# Patient Record
Sex: Female | Born: 1935 | Race: White | Hispanic: No | Smoking: Never smoker
Health system: Southern US, Community
[De-identification: ages and names within clinical notes are randomized; demographics above are authoritative.]

## PROBLEM LIST (undated history)

## (undated) DIAGNOSIS — E785 Hyperlipidemia, unspecified: Secondary | ICD-10-CM

## (undated) DIAGNOSIS — S62109A Fracture of unspecified carpal bone, unspecified wrist, initial encounter for closed fracture: Secondary | ICD-10-CM

## (undated) DIAGNOSIS — I639 Cerebral infarction, unspecified: Secondary | ICD-10-CM

## (undated) DIAGNOSIS — F329 Major depressive disorder, single episode, unspecified: Secondary | ICD-10-CM

## (undated) DIAGNOSIS — F32A Depression, unspecified: Secondary | ICD-10-CM

## (undated) DIAGNOSIS — I739 Peripheral vascular disease, unspecified: Secondary | ICD-10-CM

## (undated) DIAGNOSIS — U071 COVID-19: Secondary | ICD-10-CM

## (undated) DIAGNOSIS — M1990 Unspecified osteoarthritis, unspecified site: Secondary | ICD-10-CM

## (undated) DIAGNOSIS — D696 Thrombocytopenia, unspecified: Secondary | ICD-10-CM

## (undated) DIAGNOSIS — M8580 Other specified disorders of bone density and structure, unspecified site: Secondary | ICD-10-CM

## (undated) HISTORY — DX: Cerebral infarction, unspecified: I63.9

## (undated) HISTORY — DX: Unspecified osteoarthritis, unspecified site: M19.90

## (undated) HISTORY — DX: Fracture of unspecified carpal bone, unspecified wrist, initial encounter for closed fracture: S62.109A

## (undated) HISTORY — DX: Major depressive disorder, single episode, unspecified: F32.9

## (undated) HISTORY — DX: Other specified disorders of bone density and structure, unspecified site: M85.80

## (undated) HISTORY — DX: Depression, unspecified: F32.A

## (undated) HISTORY — DX: COVID-19: U07.1

## (undated) HISTORY — DX: Thrombocytopenia, unspecified: D69.6

## (undated) HISTORY — DX: Hyperlipidemia, unspecified: E78.5

## (undated) HISTORY — DX: Peripheral vascular disease, unspecified: I73.9

---

## 1965-09-10 HISTORY — PX: APPENDECTOMY: SHX54

## 2000-09-10 DIAGNOSIS — S62109A Fracture of unspecified carpal bone, unspecified wrist, initial encounter for closed fracture: Secondary | ICD-10-CM

## 2000-09-10 HISTORY — DX: Fracture of unspecified carpal bone, unspecified wrist, initial encounter for closed fracture: S62.109A

## 2000-09-10 HISTORY — PX: JOINT REPLACEMENT: SHX530

## 2002-07-21 ENCOUNTER — Ambulatory Visit (HOSPITAL_COMMUNITY): Admission: RE | Admit: 2002-07-21 | Discharge: 2002-07-21 | Payer: Self-pay

## 2003-03-29 ENCOUNTER — Other Ambulatory Visit: Admission: RE | Admit: 2003-03-29 | Discharge: 2003-03-29 | Payer: Self-pay | Admitting: *Deleted

## 2003-05-14 ENCOUNTER — Encounter: Payer: Self-pay | Admitting: *Deleted

## 2003-05-14 ENCOUNTER — Encounter: Admission: RE | Admit: 2003-05-14 | Discharge: 2003-05-14 | Payer: Self-pay | Admitting: *Deleted

## 2003-07-23 ENCOUNTER — Ambulatory Visit (HOSPITAL_COMMUNITY): Admission: RE | Admit: 2003-07-23 | Discharge: 2003-07-23 | Payer: Self-pay | Admitting: *Deleted

## 2004-07-24 ENCOUNTER — Ambulatory Visit (HOSPITAL_COMMUNITY): Admission: RE | Admit: 2004-07-24 | Discharge: 2004-07-24 | Payer: Self-pay | Admitting: *Deleted

## 2005-01-30 ENCOUNTER — Ambulatory Visit (HOSPITAL_COMMUNITY): Admission: RE | Admit: 2005-01-30 | Discharge: 2005-01-30 | Payer: Self-pay | Admitting: Ophthalmology

## 2005-04-11 ENCOUNTER — Other Ambulatory Visit: Admission: RE | Admit: 2005-04-11 | Discharge: 2005-04-11 | Payer: Self-pay | Admitting: *Deleted

## 2005-07-25 ENCOUNTER — Ambulatory Visit (HOSPITAL_COMMUNITY): Admission: RE | Admit: 2005-07-25 | Discharge: 2005-07-25 | Payer: Self-pay | Admitting: Family Medicine

## 2006-04-10 ENCOUNTER — Encounter: Admission: RE | Admit: 2006-04-10 | Discharge: 2006-04-10 | Payer: Self-pay | Admitting: Specialist

## 2006-06-03 ENCOUNTER — Ambulatory Visit: Payer: Self-pay | Admitting: Internal Medicine

## 2006-06-07 ENCOUNTER — Encounter: Admission: RE | Admit: 2006-06-07 | Discharge: 2006-06-07 | Payer: Self-pay | Admitting: Family Medicine

## 2006-06-13 ENCOUNTER — Ambulatory Visit: Payer: Self-pay | Admitting: Internal Medicine

## 2006-07-31 ENCOUNTER — Ambulatory Visit (HOSPITAL_COMMUNITY): Admission: RE | Admit: 2006-07-31 | Discharge: 2006-07-31 | Payer: Self-pay | Admitting: Family Medicine

## 2006-09-10 HISTORY — PX: CARPAL TUNNEL RELEASE: SHX101

## 2007-03-11 ENCOUNTER — Ambulatory Visit: Payer: Self-pay | Admitting: Cardiology

## 2007-03-11 LAB — CONVERTED CEMR LAB
BUN: 14 mg/dL (ref 6–23)
Basophils Absolute: 0 10*3/uL (ref 0.0–0.1)
Basophils Relative: 0.3 % (ref 0.0–1.0)
CO2: 30 meq/L (ref 19–32)
Calcium: 9.1 mg/dL (ref 8.4–10.5)
Chloride: 112 meq/L (ref 96–112)
Creatinine, Ser: 0.7 mg/dL (ref 0.4–1.2)
Eosinophils Absolute: 0.2 10*3/uL (ref 0.0–0.6)
Eosinophils Relative: 3.3 % (ref 0.0–5.0)
GFR calc Af Amer: 106 mL/min
GFR calc non Af Amer: 87 mL/min
Glucose, Bld: 91 mg/dL (ref 70–99)
HCT: 35.9 % — ABNORMAL LOW (ref 36.0–46.0)
Hemoglobin: 12.3 g/dL (ref 12.0–15.0)
Lymphocytes Relative: 32.2 % (ref 12.0–46.0)
MCHC: 34.2 g/dL (ref 30.0–36.0)
MCV: 88.5 fL (ref 78.0–100.0)
Monocytes Absolute: 0.6 10*3/uL (ref 0.2–0.7)
Monocytes Relative: 8.2 % (ref 3.0–11.0)
Neutro Abs: 3.8 10*3/uL (ref 1.4–7.7)
Neutrophils Relative %: 56 % (ref 43.0–77.0)
Platelets: 141 10*3/uL — ABNORMAL LOW (ref 150–400)
Potassium: 3.6 meq/L (ref 3.5–5.1)
Pro B Natriuretic peptide (BNP): 43 pg/mL (ref 0.0–100.0)
RBC: 4.06 M/uL (ref 3.87–5.11)
RDW: 12.9 % (ref 11.5–14.6)
Sodium: 143 meq/L (ref 135–145)
WBC: 6.8 10*3/uL (ref 4.5–10.5)

## 2007-03-26 ENCOUNTER — Encounter: Payer: Self-pay | Admitting: Cardiology

## 2007-03-26 ENCOUNTER — Ambulatory Visit: Payer: Self-pay

## 2007-07-12 LAB — HM COLONOSCOPY: HM Colonoscopy: NORMAL

## 2007-08-11 ENCOUNTER — Ambulatory Visit (HOSPITAL_COMMUNITY): Admission: RE | Admit: 2007-08-11 | Discharge: 2007-08-11 | Payer: Self-pay | Admitting: Family Medicine

## 2008-08-13 ENCOUNTER — Ambulatory Visit (HOSPITAL_COMMUNITY): Admission: RE | Admit: 2008-08-13 | Discharge: 2008-08-13 | Payer: Self-pay | Admitting: Family Medicine

## 2009-04-19 ENCOUNTER — Encounter: Admission: RE | Admit: 2009-04-19 | Discharge: 2009-04-19 | Payer: Self-pay | Admitting: Family Medicine

## 2009-08-31 ENCOUNTER — Ambulatory Visit (HOSPITAL_COMMUNITY): Admission: RE | Admit: 2009-08-31 | Discharge: 2009-08-31 | Payer: Self-pay | Admitting: Family Medicine

## 2009-12-02 ENCOUNTER — Encounter: Payer: Self-pay | Admitting: Family Medicine

## 2010-06-10 LAB — HM MAMMOGRAPHY: HM Mammogram: NORMAL

## 2010-06-22 ENCOUNTER — Encounter: Payer: Self-pay | Admitting: Family Medicine

## 2010-09-05 ENCOUNTER — Ambulatory Visit (HOSPITAL_COMMUNITY)
Admission: RE | Admit: 2010-09-05 | Discharge: 2010-09-05 | Payer: Self-pay | Source: Home / Self Care | Attending: Family Medicine | Admitting: Family Medicine

## 2010-09-08 ENCOUNTER — Ambulatory Visit: Payer: Self-pay | Admitting: Family Medicine

## 2010-09-08 DIAGNOSIS — F329 Major depressive disorder, single episode, unspecified: Secondary | ICD-10-CM | POA: Insufficient documentation

## 2010-09-08 DIAGNOSIS — E785 Hyperlipidemia, unspecified: Secondary | ICD-10-CM | POA: Insufficient documentation

## 2010-09-08 DIAGNOSIS — F339 Major depressive disorder, recurrent, unspecified: Secondary | ICD-10-CM | POA: Insufficient documentation

## 2010-09-08 LAB — CONVERTED CEMR LAB
Cholesterol, target level: 200 mg/dL
HDL goal, serum: 40 mg/dL
LDL Goal: 160 mg/dL

## 2010-09-19 ENCOUNTER — Encounter
Admission: RE | Admit: 2010-09-19 | Discharge: 2010-09-19 | Payer: Self-pay | Source: Home / Self Care | Attending: Family Medicine | Admitting: Family Medicine

## 2010-10-12 NOTE — Assessment & Plan Note (Signed)
Summary: new to est//ccm   Vital Signs:  Patient profile:   77 year old female Menstrual status:  postmenopausal Height:      61.75 inches Weight:      192 pounds BMI:     35.53 Temp:     98.2 degrees F oral Pulse rate:   80 / minute Pulse rhythm:   regular Resp:     12 per minute BP sitting:   120 / 68  (left arm) Cuff size:   regular  Vitals Entered By: Sid Falcon LPN (September 08, 2010 2:55 PM)  Nutrition Counseling: Patient's BMI is greater than 25 and therefore counseled on weight management options.  History of Present Illness: Here to establish care. Hx hyperlipidemia and depression. Treated with Effexor for years and stable.  On Simvastatin for hyperlipidemia with no side effects.   No hx of CAD or PVD.  Depression History:      The patient denies significant weight loss, insomnia, hypersomnia, psychomotor retardation, feelings of worthlessness (guilt), and recurrent thoughts of death or suicide.         Lipid Management History:      Positive NCEP/ATP III risk factors include female age 23 years old or older.  Negative NCEP/ATP III risk factors include non-diabetic, non-tobacco-user status, non-hypertensive, no ASHD (atherosclerotic heart disease), no prior stroke/TIA, no peripheral vascular disease, and no history of aortic aneurysm.      Preventive Screening-Counseling & Management  Alcohol-Tobacco     Smoking Status: never  Allergies (verified): No Known Drug Allergies  Past History:  Family History: Last updated: 09/08/2010 Mother Breast cancer Sister breast cancer  Social History: Last updated: 09/08/2010 Retired Married Never Smoked Alcohol use-no4 pregnancies 4 live births  Risk Factors: Smoking Status: never (09/08/2010)  Past Medical History: Arthritis Depression Hyperlipidemia  Past Surgical History: Total right knee replacement 2002 CTS right 2008 Fx left wrist 2002 Appendectomy 1967 PMH-FH-SH reviewed for  relevance  Family History: Mother Breast cancer Sister breast cancer  Social History: Retired Married Never Smoked Alcohol use-no4 pregnancies 4 live birthsSmoking Status:  never  Review of Systems  The patient denies anorexia, fever, weight loss, chest pain, syncope, dyspnea on exertion, peripheral edema, prolonged cough, headaches, hemoptysis, abdominal pain, melena, hematochezia, severe indigestion/heartburn, incontinence, and depression.    Physical Exam  General:  Well-developed,well-nourished,in no acute distress; alert,appropriate and cooperative throughout examination Ears:  External ear exam shows no significant lesions or deformities.  Otoscopic examination reveals clear canals, tympanic membranes are intact bilaterally without bulging, retraction, inflammation or discharge. Hearing is grossly normal bilaterally. Mouth:  Oral mucosa and oropharynx without lesions or exudates.  Teeth in good repair. Neck:  No deformities, masses, or tenderness noted. Lungs:  Normal respiratory effort, chest expands symmetrically. Lungs are clear to auscultation, no crackles or wheezes. Heart:  normal rate and regular rhythm.   Neurologic:  alert & oriented X3 and cranial nerves II-XII intact.   Psych:  normally interactive, good eye contact, not anxious appearing, and not depressed appearing.     Impression & Recommendations:  Problem # 1:  HYPERLIPIDEMIA (ICD-272.4) send for old records.  She thinks lipids checked just few months ago. Her updated medication list for this problem includes:    Simvastatin 10 Mg Tabs (Simvastatin) ..... Once daily  Problem # 2:  DEPRESSION, RECURRENT (ICD-311)  Her updated medication list for this problem includes:    Effexor Xr 75 Mg Xr24h-cap (Venlafaxine hcl) ..... Once daily  Complete Medication List: 1)  Effexor Xr  75 Mg Xr24h-cap (Venlafaxine hcl) .... Once daily 2)  Simvastatin 10 Mg Tabs (Simvastatin) .... Once daily  Lipid  Assessment/Plan:      Based on NCEP/ATP III, the patient's risk factor category is "0-1 risk factors".  The patient's lipid goals are as follows: Total cholesterol goal is 200; LDL cholesterol goal is 160; HDL cholesterol goal is 40; Triglyceride goal is 150.    Patient Instructions: 1)  Please schedule a follow-up appointment in 3 months .    Orders Added: 1)  New Patient Level III [16010]    Preventive Care Screening  Last Flu Shot:    Date:  07/11/2010    Results:  given   Mammogram:    Date:  06/10/2010    Results:  normal   Colonoscopy:    Date:  07/12/2007    Results:  normal   Last Pneumovax:    Date:  09/10/2001    Results:  given

## 2010-12-08 ENCOUNTER — Encounter: Payer: Self-pay | Admitting: Family Medicine

## 2010-12-08 ENCOUNTER — Ambulatory Visit (INDEPENDENT_AMBULATORY_CARE_PROVIDER_SITE_OTHER): Payer: Medicare Other | Admitting: Family Medicine

## 2010-12-08 DIAGNOSIS — E785 Hyperlipidemia, unspecified: Secondary | ICD-10-CM

## 2010-12-08 DIAGNOSIS — E559 Vitamin D deficiency, unspecified: Secondary | ICD-10-CM

## 2010-12-08 LAB — LIPID PANEL
Cholesterol: 197 mg/dL (ref 0–200)
HDL: 52.2 mg/dL (ref >39.00)
LDL Cholesterol: 127 mg/dL — ABNORMAL HIGH (ref 0–99)
Total CHOL/HDL Ratio: 4
Triglycerides: 89 mg/dL (ref 0.0–149.0)
VLDL: 17.8 mg/dL (ref 0.0–40.0)

## 2010-12-08 NOTE — Progress Notes (Signed)
  Subjective:    Patient ID: Sara Logan, female    DOB: [birth year], 77 y.o.   MRN: [id number]  HPI Patient seen for followup. She has history of hyperlipidemia. Takes simvastatin 10 mg daily. Ran out 2-3 weeks ago. Request lipids today off medication out of curiosity. No history of peripheral vascular disease or CAD. No myalgias or other side effects from her medication.  Patient has history of depression stable on Effexor XR 75 mg daily. She's had recurrent depression and is reluctant to stop. No side effects from medication. Compliant with therapy.  Also reports prior history of vitamin D deficiency. Takes over-the-counter supplement. Previously on 50,000 international unit supplement once weekly.  No history of recent falls.  No history of osteoporosis.   Review of Systems  Constitutional: Negative for appetite change, fatigue and unexpected weight change.  HENT: Negative for trouble swallowing.   Respiratory: Negative for cough and shortness of breath.   Cardiovascular: Negative for chest pain and leg swelling.  Gastrointestinal: Negative for vomiting, abdominal pain and constipation.  Neurological: Negative for syncope.  Hematological: Negative for adenopathy.  Psychiatric/Behavioral: Negative for confusion and dysphoric mood.       Objective:   Physical Exam  Constitutional: She is oriented to person, place, and time. She appears well-developed and well-nourished.  Neck: Neck supple. No thyromegaly present.  Cardiovascular: Normal rate, regular rhythm and normal heart sounds.   No murmur heard. Pulmonary/Chest: Effort normal and breath sounds normal. No respiratory distress. She has no wheezes. She has no rales.  Musculoskeletal: She exhibits no edema.  Lymphadenopathy:    She has no cervical adenopathy.  Neurological: She is alert and oriented to person, place, and time.          Assessment & Plan:  #1 hyperlipidemia. Recheck lipids today #2 history of depression  stable on Effexor XR. Continue current medication #3 reported history of vitamin D deficiency. Currently on over-the-counter placement. Check levels.

## 2010-12-09 ENCOUNTER — Encounter: Payer: Self-pay | Admitting: Family Medicine

## 2010-12-09 LAB — VITAMIN D 25 HYDROXY (VIT D DEFICIENCY, FRACTURES): Vit D, 25-Hydroxy: 30 ng/mL (ref 30–89)

## 2010-12-12 NOTE — Progress Notes (Signed)
Quick Note:  Pt informed ______ 

## 2011-01-01 ENCOUNTER — Other Ambulatory Visit: Payer: Self-pay | Admitting: *Deleted

## 2011-01-01 MED ORDER — VENLAFAXINE HCL ER 75 MG PO CP24: 75.0000 mg | ORAL_CAPSULE | Freq: Every day | ORAL | Status: DC

## 2011-01-23 NOTE — Assessment & Plan Note (Signed)
[facility]                            CARDIOLOGY OFFICE NOTE   NAME:Sara Logan, Sara Logan                       MRN:          [id number]  DATE:03/11/2007                            DOB:          June 30, 1933    REFERRING PHYSICIAN:  Ursula Beath, MD   REASON FOR REFERRAL:  Evaluation of shortness of breath.   CLINICAL HISTORY:  Ms. Idler is 77 years old, and has no prior history  of known heart disease.  I have known her through her husband, Jamyra Zweig, who is a patient of mine.  She recently saw Dr. Raquel James for an  evaluation of some abdominal discomfort, and at that time gave a history  of shortness of breath with exertion.  She noticed this with such  activities as walking a dog.  She says she might have some mild chest  discomfort at the same time.  She has no associated palpitations.   She also had some symptoms of mild belching and acid taste in the back  of her mouth, and Dr. Raquel James prescribed Protonix, but she has not yet  filled this.  She was also having some lower abdominal pain at that  time, but that is pretty much resolved.   When she saw Dr .Raquel James, her ECG showed right bundle branch block,  which I think was a new finding.   PAST MEDICAL HISTORY:  Significant for prior total knee replacement in  2002.   CURRENT MEDICATIONS:  Include glucosamine, fish oil, Fosamax, Effexor,  calcium, vitamin, and multivitamins.   SOCIAL HISTORY:  She lives with her husband, and they just recently  moved to an apartment with plans to move to a friend's home later.  She  is retired, and her husband is a retired Airline pilot.  She does not smoke.  She and her husband have 1 child who lives in [place], and another child  who lives in [place] where her husband grew up.   FAMILY HISTORY:  Her mother is still alive at age 70, and has had a  previous stroke.  Her father died in his mid 29s of congestive heart  failure.  She has 3 sisters who are  alive without heart disease.   REVIEW OF SYSTEMS:  Positive for arthritic symptoms and symptoms of  fatigue.   EXAMINATION:  The blood pressure is 138/83, and the pulse 74 and  regular.  There was no venous distention.  The carotid pulses were full, and I  could hear no bruits.  CHEST:  Was clear.  I could hear no rales or rhonchi.  CARDIAC:  Rhythm was regular.  The heart sounds were normal, and I could  hear no murmurs or gallops.  ABDOMEN:  Soft with normal bowel sounds.  There was no  hepatosplenomegaly.  Peripheral pulses were full.  There was no peripheral edema.  There were  varicosities in the lower extremities.  MUSCULOSKELETAL:  Showed no deformities.  SKIN:  Warm and dry.  NEUROLOGIC EXAM:  Showed no focal neurological signs.   IMPRESSION:  1. Dyspnea of uncertain etiology.  Rule out  ischemic heart disease.  2. Right bundle branch block.  3. History of elevated cholesterol.  4. Possible gastroesophageal reflux disease.   RECOMMENDATIONS:  Ms. Sydney' symptoms of dyspnea on exertion certainly  could be an ischemic equivalent.  Her risk profile is moderate with her  77 and elevated cholesterol.  She is concerned about her symptoms, and  I think they should be evaluated further, and we will plan to arrange  for her to have an adenosine rest/stress Myoview scan and 2-D  echocardiogram.  We will also get a BNP, a BMP, and CBC today.  I will  be in touch with her by phone after we have the results, and we will  decide if any further evaluation is needed.  If we do not find a clear  etiology, then we may attribute her symptoms to increased weight and  conditioning.     Bruce Elvera Lennox Juanda Chance, MD, [facility]  Electronically Signed    BRB/MedQ  DD: 03/11/2007  DT: 03/11/2007  Job #: [id number]   cc:   Ursula Beath, MD

## 2011-01-26 NOTE — H&P (Signed)
Sara Logan, Sara Logan                ACCOUNT NO.:  [id number]   MEDICAL RECORD NO.:  [id number]          PATIENT TYPE:  OIB   LOCATION:  2899                         FACILITY:  [facility]   PHYSICIAN:  Guadelupe Sabin, M.D.DATE OF BIRTH:  [birth year]   DATE OF ADMISSION:  01/30/2005  DATE OF DISCHARGE:  01/30/2005                                HISTORY & PHYSICAL   OUTPATIENT NOTE:  This was a planned outpatient surgical admission of this  77 year old white female admitted for cataract implant surgery of the right  eye.   PRESENT ILLNESS:  This patient was first seen in my office on September 20, 2003. Examination at that time revealed bilateral nuclear cataract formation  in both eyes. Vision was recorded at 20/40 right eye, 20/20 left eye, -1.  Slit lamp examination confirmed the presence of early cataract formation in  both eyes. Dilated detailed fundus examination was normal. Subsequently, the  patient returned for monitoring and was noted to have some exfoliation of  the lens capsule in the left eye. Vision continued to deteriorate in the  right eye at 20/40.  No definite pseudoexfoliation was seen in the right  eye. Due to the patient's complaints, it was elected to proceed with surgery  of the right eye at this time, left eye later as needed. The patient was  given oral discussion and printed information concerning the procedure and  its possible complications. She signed an informed consent and arrangements  were made for her outpatient admission at this time. The patient's  subjective complaints were recorded with aware of blurred smoky dim vision,  difficulty with driving, seeing road signs, difficulty seeing television,  difficulty with reading, difficulty in bright sunlight and glare, and  difficulty with seeing to do her hobbies.   PAST MEDICAL HISTORY:  The patient is under the care of Dr. Benedetto Goad and  is felt to be in satisfactory condition for the proposed surgery.   CURRENT MEDICATIONS:  Effexor XR 75 mg a day.   ALLERGIES:  No known allergies.   Dr. Andrey Campanile feels the patient should be an excellent candidate for the  proposed surgery.   PHYSICAL EXAMINATION:  VITAL SIGNS:  As recorded on admission, blood  pressure 155/85, pulse 84, respirations 16, temperature 97.8.  GENERAL APPEARANCE:  The patient is a pleasant 77 year old white female in  no acute distress.  HEENT:  Eyes - visual acuity as noted above. Glare testing 20/150.  Applanation tonometry normal 20 mm each eye. Slit lamp examination as noted  above. Dilated detailed fundus examination normal.  CHEST/LUNGS:  Clear to percussion and auscultation.  HEART:  Normal sinus rhythm, no cardiomegaly. No murmurs.  ABDOMEN:  Negative.  EXTREMITIES:  Negative.   ADMISSION DIAGNOSIS:  Senile cataract, both eyes, pseudoexfoliation of lens,  left eye.   SURGICAL PLAN:  Cataract implant surgery right eye now, left eye later.      HNJ/MEDQ  D:  02/05/2005  T:  02/05/2005  Job:  [id number]

## 2011-01-26 NOTE — Letter (Signed)
April 01, 2007    Ursula Beath, MD  [facility]- Resident  [address]  [place], [place] [address]   RE:  Sara Logan, Sara Logan  MRN:  [id number]  /  DOB:  1932/12/15   Dear Victorino Dike:   I spoke with Sara Logan today about the results of her test. As you  recall, she was having symptoms of shortness of breath. We did a stress  Myoview scan and there was no evidence of ischemia. We did an  echocardiogram and she had normal left ventricular function and no major  valvular abnormalities. Her left ventricular wall thickness was  borderline. She has no history of hypertension. This could be  congenital, but it is very mild and borderline. I would not make too  much of it.   I think most probably her symptoms of dyspnea are related to her weight  and deconditioning and I told her with the results of these tests, I  think it would be quite safe and good for her to work on a regular  exercise program and weight reduction. She plans to see you in followup  in the near future. She also has a history of elevated cholesterol and  you can make a decision after you have the results of her lipid panel  there if she needs diet or something more.   Thanks very much for asking me to see her.    Sincerely,      Bruce R. Juanda Chance, MD, [facility]  Electronically Signed   BRB/MedQ  DD: 04/01/2007  DT: 04/01/2007  Job #: [id number]

## 2011-01-26 NOTE — Op Note (Signed)
NAMEBREINDEL, COLLIER                ACCOUNT NO.:  [id number]   MEDICAL RECORD NO.:  [id number]          PATIENT TYPE:  OIB   LOCATION:  2899                         FACILITY:  [facility]   PHYSICIAN:  Guadelupe Sabin, M.D.DATE OF BIRTH:  1933-01-07   DATE OF PROCEDURE:  01/30/2005  DATE OF DISCHARGE:  01/30/2005                                 OPERATIVE REPORT   PREOPERATIVE DIAGNOSIS:  Senile nuclear cataract, right eye.   POSTOPERATIVE DIAGNOSIS:  Senile nuclear cataract, right eye.   DATE OF OPERATION:  Planned extracapsular cataract extraction -  phacoemulsification, primary insertion of posterior chamber intraocular lens  implant.   SURGEON:  Guadelupe Sabin, M.D.   ASSISTANT:  Nurse.   ANESTHESIA:  Local 4% Xylocaine plus 0.75 Marcaine retrobulbar block with  Wydase added, topical tetracaine and intraocular Xylocaine.  Anesthesia  standby required in this elderly patient. Patient given sodium pentothal  intravenously during the period of retrobulbar blocking.   OPERATIVE PROCEDURE:  After the patient was prepped and draped, a lid  speculum was inserted in the right eye. Schiotz tonometry was recorded at 6-  8 scale units with a 5.5 g weight. A peritomy was performed adjacent to the  limbus from the 11 to the 1 o'clock position after a superior rectus  traction suture had been placed. The corneoscleral junction was cleaned and  a corneoscleral groove made with a 45-degree Superblade. The anterior  chamber was then entered with the 2.5 mm diamond keratome at the 12 o'clock  position and the 15-degree blade at the 2:30 position. Using a bent 26 gauge  needle on an Ocucoat syringe, a circular capsulorrhexis was begun and then  completed with the Utrata forceps. Hydrodissection and hydrodelineation were  performed using 1% Xylocaine. The 30-degree phacoemulsification tip was then  inserted with slow controlled emulsification of the lens nucleus. Back  fragmentation was achieved  with the Bechert pick. Total ultrasonic time 38  seconds. Average power level 13%. Total amount of fluid used 40 mL.  Following removal of the nucleus, the residual cortex was aspirated with the  irrigation-aspiration tip. The posterior capsule appeared intact with a  brilliant red fundus reflex. It was therefore elected to insert an [employer] [id number] silicone three-piece posterior chamber intraocular  lens implant, diopter strength +21.50. This was inserted with the McDonald  forceps into the anterior chamber and then centered into the capsular bag  using the [facility] lens rotator. The lens appeared to be well centered. The  Ocucoat and Provisc, which had been used intermittently during the  procedures, were aspirated and replaced with balanced salt solution and  Miochol ophthalmic solution. The operative incisions appeared to be self-  sealing. It was, however, elected to place a single 10-0 interrupted nylon  suture across a 12 o'clock incision to ensure closure and to prevent  endophthalmitis. Maxitrol ointment was instilled in the conjunctival cul-de-  sac and a light patch and protective shield applied. Duration of procedure  and anesthesia administration was 45 minutes. The patient tolerated the  procedure well in general and left the operating room  for the recovery room  in good condition.      HNJ/MEDQ  D:  02/05/2005  T:  02/05/2005  Job:  [id number]   cc:   Gloriajean Dell. Andrey Campanile, M.D.  [id number]  [place]  [place] [address]  Fax: [phone]

## 2011-02-20 ENCOUNTER — Other Ambulatory Visit: Payer: Self-pay | Admitting: Family Medicine

## 2011-02-20 DIAGNOSIS — N6009 Solitary cyst of unspecified breast: Secondary | ICD-10-CM

## 2011-02-26 ENCOUNTER — Encounter: Payer: Self-pay | Admitting: Family Medicine

## 2011-02-26 ENCOUNTER — Ambulatory Visit (INDEPENDENT_AMBULATORY_CARE_PROVIDER_SITE_OTHER): Payer: Medicare Other | Admitting: Family Medicine

## 2011-02-26 VITALS — BP 140/70 | Temp 98.8°F | Wt 202.0 lb

## 2011-02-26 DIAGNOSIS — L0201 Cutaneous abscess of face: Secondary | ICD-10-CM

## 2011-02-26 DIAGNOSIS — L03211 Cellulitis of face: Secondary | ICD-10-CM

## 2011-02-26 MED ORDER — CEPHALEXIN 500 MG PO CAPS: 500.0000 mg | ORAL_CAPSULE | Freq: Three times a day (TID) | ORAL | Status: AC

## 2011-02-26 NOTE — Patient Instructions (Signed)
Follow up promptly for any fever or worsening redness/rash.

## 2011-02-26 NOTE — Progress Notes (Signed)
  Subjective:    Patient ID: Sara Logan, female    DOB: [birth year], 77 y.o.   MRN: [id number]  HPI Patient seen with rash 2 week duration right chin and neck region. Denies any fever or chills. Associated edema which is worse when she lays on her right side. Some mild pruritus. Also slightly tender. No vesicles or pustules.  Patient also generally complaining of fatigue. She had CBC back in October with platelet count 115,000 with normal hemoglobin. Denies active depression issues. Appetite and weight stable.   Review of Systems  Constitutional: Positive for fatigue. Negative for fever, chills and appetite change.  HENT: Negative for trouble swallowing.   Respiratory: Negative for cough and shortness of breath.   Cardiovascular: Negative for chest pain and leg swelling.  Skin: Positive for rash.  Hematological: Negative for adenopathy. Does not bruise/bleed easily.  Psychiatric/Behavioral: Negative for dysphoric mood.       Objective:   Physical Exam  Constitutional: She appears well-developed and well-nourished. No distress.  HENT:  Right Ear: External ear normal.  Left Ear: External ear normal.  Mouth/Throat: Oropharynx is clear and moist.       Patient has some erythema involving mostly right neck and submandibular region extending to the chin. No pustules. No vesicles. No submandibular gland swelling. Minimally tender to palpation. Minimal warmth area involved approximately 8 x 8 cm  Neck: Neck supple.  Cardiovascular: Normal rate and regular rhythm.   Pulmonary/Chest: Effort normal and breath sounds normal. No respiratory distress. She has no wheezes. She has no rales.  Lymphadenopathy:    She has no cervical adenopathy.  Skin: Rash noted.          Assessment & Plan:  Cellulitis right neck and chin. Initially thought shingles but no vesicles present.  No right submandibular gland involvement

## 2011-03-07 ENCOUNTER — Ambulatory Visit (INDEPENDENT_AMBULATORY_CARE_PROVIDER_SITE_OTHER): Payer: Medicare Other | Admitting: Family Medicine

## 2011-03-07 ENCOUNTER — Encounter: Payer: Self-pay | Admitting: Family Medicine

## 2011-03-07 VITALS — BP 120/80 | Temp 98.0°F | Wt 200.0 lb

## 2011-03-07 DIAGNOSIS — L0201 Cutaneous abscess of face: Secondary | ICD-10-CM

## 2011-03-07 DIAGNOSIS — L03211 Cellulitis of face: Secondary | ICD-10-CM

## 2011-03-07 NOTE — Patient Instructions (Signed)
Follow up promptly for any redness, fever, or chills after finishing antibiotic.

## 2011-03-07 NOTE — Progress Notes (Signed)
  Subjective:    Patient ID: Sara Logan, female    DOB: 01/30/1933, 77 y.o.   MRN: [id number]  HPI Patient for followup. Recent presumed cellulitis right face. Finishing Keflex. Overall improved. Less redness. Somewhat less swelling though still occasionally swelling. No fever or chills. No sore throat and no swallowing difficulties.   Review of Systems  Constitutional: Negative for fever and chills.  HENT: Negative for sore throat and trouble swallowing.   Respiratory: Negative for cough and shortness of breath.   Skin: Negative for rash.       Objective:   Physical Exam  Constitutional: She appears well-developed and well-nourished.  Cardiovascular: Normal rate and regular rhythm.   Pulmonary/Chest: Breath sounds normal. No respiratory distress. She has no wheezes. She has no rales.  Skin:       Patient has much less erythema compared to last visit. Less warmth to touch. Nontender. No vesicles. No pustules. No parotid swelling. No submandibular or sublingual gland swelling          Assessment & Plan:  Cellulitis of the face improved. Finish out antibiotic. Prompt follow up if recurs

## 2011-03-15 ENCOUNTER — Other Ambulatory Visit: Payer: Self-pay | Admitting: Family Medicine

## 2011-03-15 ENCOUNTER — Ambulatory Visit
Admission: RE | Admit: 2011-03-15 | Discharge: 2011-03-15 | Disposition: A | Discharge status: Home / Self Care | Payer: Medicare Other | Source: Ambulatory Visit | Attending: Family Medicine | Admitting: Family Medicine

## 2011-03-15 DIAGNOSIS — N6009 Solitary cyst of unspecified breast: Secondary | ICD-10-CM

## 2011-03-16 ENCOUNTER — Other Ambulatory Visit: Payer: Self-pay | Admitting: Family Medicine

## 2011-03-16 DIAGNOSIS — N63 Unspecified lump in unspecified breast: Secondary | ICD-10-CM

## 2011-03-28 LAB — HM MAMMOGRAPHY

## 2011-03-30 ENCOUNTER — Ambulatory Visit
Admission: RE | Admit: 2011-03-30 | Discharge: 2011-03-30 | Disposition: A | Discharge status: Home / Self Care | Payer: Medicare Other | Source: Ambulatory Visit | Attending: Family Medicine | Admitting: Family Medicine

## 2011-03-30 ENCOUNTER — Other Ambulatory Visit: Payer: Self-pay | Admitting: Family Medicine

## 2011-03-30 DIAGNOSIS — N63 Unspecified lump in unspecified breast: Secondary | ICD-10-CM

## 2011-04-10 ENCOUNTER — Ambulatory Visit (INDEPENDENT_AMBULATORY_CARE_PROVIDER_SITE_OTHER): Payer: Medicare Other | Admitting: Family Medicine

## 2011-04-10 ENCOUNTER — Encounter: Payer: Self-pay | Admitting: Family Medicine

## 2011-04-10 VITALS — BP 120/80 | Temp 98.6°F | Wt 201.0 lb

## 2011-04-10 DIAGNOSIS — M546 Pain in thoracic spine: Secondary | ICD-10-CM

## 2011-04-10 DIAGNOSIS — M549 Dorsalgia, unspecified: Secondary | ICD-10-CM
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
## 2011-04-10 NOTE — Patient Instructions (Signed)
Try heat to neck and upper back Use Advil or Aleve for pain relief. Consider muscle massage and topical rub such as Biofreeze. Touch base by next week if no better.

## 2011-05-10 ENCOUNTER — Ambulatory Visit (INDEPENDENT_AMBULATORY_CARE_PROVIDER_SITE_OTHER): Payer: Medicare Other | Admitting: Family Medicine

## 2011-05-10 ENCOUNTER — Encounter: Payer: Self-pay | Admitting: Family Medicine

## 2011-05-10 VITALS — BP 130/80 | Temp 98.6°F | Wt 130.0 lb

## 2011-05-10 DIAGNOSIS — M542 Cervicalgia: Secondary | ICD-10-CM

## 2011-05-10 DIAGNOSIS — R21 Rash and other nonspecific skin eruption: Secondary | ICD-10-CM

## 2011-05-10 NOTE — Progress Notes (Signed)
  Subjective:    Patient ID: Sara Logan, female    DOB: August 13, 1933, 77 y.o.   MRN: [id number]  HPI Patient seen for the following.  Persistent redness and swelling right face mostly under her mandible. Denies mass. Duration is over 2 months. Initially consideration for cellulitis and patient treated with cephalexin for 10 days without improvement. Area of redness is nonpainful and nonpruritic. Patient denies fever or chills. No history of any pustules or vesicles. She has not tried anything topically.  Patient complains of some dull to sharp pain right shoulder region radiating toward the hand. Symptoms are somewhat intermittent. Occasional pain in the base cervical neck. No injury. Denies any weakness. No significant numbness. Symptoms are moderate duration. Advil helps. Generally sleeping well.  Past Medical History  Diagnosis Date  . Arthritis   . Depression   . Hyperlipidemia   . Osteopenia   . Thrombocytopenia   . Vitamin D deficiency   . Wrist fracture 2002    left   Past Surgical History  Procedure Date  . Joint replacement 2002    right total   . Carpal tunnel release 2008    right  . Appendectomy 1967    reports that she has never smoked. She does not have any smokeless tobacco history on file. She reports that she does not drink alcohol or use illicit drugs. family history includes Cancer in her mother and sister. No Known Allergies    Review of Systems  Constitutional: Negative for fever, chills, appetite change, fatigue and unexpected weight change.  Respiratory: Negative for cough and shortness of breath.   Cardiovascular: Negative for chest pain.  Gastrointestinal: Negative for abdominal pain.  Neurological: Negative for weakness, numbness and headaches.  Hematological: Negative for adenopathy.       Objective:   Physical Exam  Constitutional: She is oriented to person, place, and time. She appears well-developed and well-nourished.  HENT:  Right Ear:  External ear normal.  Left Ear: External ear normal.  Mouth/Throat: Oropharynx is clear and moist.       No evidence for gum abscess or gum edema  Neck: Neck supple. No thyromegaly present.  Cardiovascular: Normal rate and regular rhythm.   Pulmonary/Chest: Effort normal and breath sounds normal. No respiratory distress. She has no wheezes. She has no rales.  Musculoskeletal: She exhibits no edema.       Minimal tenderness lower cervical region and right trapezius  Lymphadenopathy:    She has no cervical adenopathy.  Neurological: She is alert and oriented to person, place, and time.       Full-strength upper extremities. Symmetric reflexes. No sensory impairment.  Skin:       Patient has area of nontender, non-warmth erythema which is mostly under the right mandible that extends up slightly into the cheek region on the right. Area of redness is approximately 10 X 12 cm.  No pustules or vesicles. No fluctuance. Minimal induration  Psychiatric: She has a normal mood and affect. Her behavior is normal.          Assessment & Plan:  #1 cervical neck pain with probable right upper extremity radiculopathy symptoms.  May have some nerve impingement from cervical spondylosis or possibly discogenic. Nonfocal neuro exam. Pain relatively mild at this point. Recommend observation continue, or Advil for symptom relief. Consider x-rays if no better over the next few weeks or for any progressive symptoms #2 right facial erythema.  Derm referral for second opinion.

## 2011-05-10 NOTE — Patient Instructions (Signed)
Follow up promptly for any R arm weakness, persistent numbness, or any progressive pain Continue Tylenol or Advil as needed for pain. We will call you for appt with Dr Hortense Ramal

## 2011-06-06 ENCOUNTER — Ambulatory Visit (INDEPENDENT_AMBULATORY_CARE_PROVIDER_SITE_OTHER): Payer: Medicare Other | Admitting: Family Medicine

## 2011-06-06 ENCOUNTER — Encounter: Payer: Self-pay | Admitting: Family Medicine

## 2011-06-06 VITALS — BP 130/80 | Temp 98.5°F | Wt 203.0 lb

## 2011-06-06 DIAGNOSIS — H9191 Unspecified hearing loss, right ear: Secondary | ICD-10-CM

## 2011-06-06 DIAGNOSIS — H9190 Unspecified hearing loss, unspecified ear: Secondary | ICD-10-CM

## 2011-06-06 NOTE — Progress Notes (Signed)
  Subjective:    Patient ID: Sara Logan, female    DOB: Nov 06, 1932, 77 y.o.   MRN: [id number]  HPI Patient seen for the following issues  Induration right chin and neck region which had been present for several months. Has appointment with dermatologist next month. No change in symptoms.  Recently went for audiology screening. Bilateral high-frequency hearing loss but she had some asymmetric loss right greater than left at the 1000 dB range. Recommendation to see ENT. She has some bilateral tinnitus and no vertigo. No reported sudden hearing loss.  Questions regarding immunizations. No history of shingles vaccine. No flu vaccine yet.   Review of Systems  Constitutional: Negative for fever and chills.  HENT: Positive for tinnitus. Negative for neck pain and ear discharge.   Eyes: Negative for visual disturbance.  Respiratory: Negative for cough.   Cardiovascular: Negative for chest pain.  Neurological: Negative for dizziness and headaches.       Objective:   Physical Exam  Constitutional: She appears well-developed and well-nourished.  HENT:  Right Ear: External ear normal.  Left Ear: External ear normal.  Mouth/Throat: Oropharynx is clear and moist.  Neck: Neck supple.  Cardiovascular: Normal rate and regular rhythm.   Pulmonary/Chest: Effort normal and breath sounds normal. No respiratory distress. She has no wheezes. She has no rales.  Lymphadenopathy:    She has no cervical adenopathy.          Assessment & Plan:  #1 right ear hearing loss greater than left. By review of recent audiogram she has bilateral high frequency loss but at lower frequencies right greater than left. ENT referral #2 indurated type chronic right facial rash. No evidence for cellulitis. Dermatology referral pending #3 health maintenance. Check on shingles vaccine coverage. Return in October for flu vaccine

## 2011-06-06 NOTE — Patient Instructions (Signed)
Return in October for flu vaccine We will call you regarding appointment with ENT

## 2011-06-15 ENCOUNTER — Ambulatory Visit: Payer: Medicare Other

## 2011-06-19 ENCOUNTER — Ambulatory Visit: Payer: Medicare Other

## 2011-06-20 ENCOUNTER — Encounter: Payer: Self-pay | Admitting: Family Medicine

## 2011-06-20 ENCOUNTER — Ambulatory Visit (INDEPENDENT_AMBULATORY_CARE_PROVIDER_SITE_OTHER): Payer: Medicare Other | Admitting: Family Medicine

## 2011-06-20 VITALS — BP 140/72 | Temp 98.6°F | Wt 202.0 lb

## 2011-06-20 DIAGNOSIS — N390 Urinary tract infection, site not specified: Secondary | ICD-10-CM

## 2011-06-20 DIAGNOSIS — R300 Dysuria: Secondary | ICD-10-CM

## 2011-06-20 LAB — POCT URINALYSIS DIPSTICK
Nitrite, UA: POSITIVE
Spec Grav, UA: 1.01
Urobilinogen, UA: 4
pH, UA: 5

## 2011-06-20 MED ORDER — CIPROFLOXACIN HCL 500 MG PO TABS: 500.0000 mg | ORAL_TABLET | Freq: Two times a day (BID) | ORAL | Status: AC

## 2011-06-20 NOTE — Progress Notes (Signed)
  Subjective:    Patient ID: Sara Logan, female    DOB: May 07, 1933, 77 y.o.   MRN: [id number]  HPI Acute visit. One-day history or in frequency and burning.  Chills yesterday but temperature not taken. Similar symptoms with cystitis the past. Patient denies any nausea or vomiting. No back pain. No known drug allergies. No abnormal pain. She is drinking plenty of fluids.   Review of Systems  Constitutional: Positive for fever, chills and fatigue. Negative for activity change and appetite change.  HENT: Negative for sore throat and trouble swallowing.   Cardiovascular: Negative for chest pain and palpitations.  Gastrointestinal: Negative for nausea, vomiting and abdominal pain.  Genitourinary: Positive for dysuria and frequency. Negative for hematuria.  Musculoskeletal: Negative for back pain.  Skin: Negative for rash.  Neurological: Negative for dizziness, syncope and headaches.  Hematological: Negative for adenopathy.  Psychiatric/Behavioral: Negative for confusion.       Objective:   Physical Exam  Constitutional: She appears well-developed and well-nourished.  HENT:  Head: Normocephalic and atraumatic.  Neck: Neck supple. No thyromegaly present.  Cardiovascular: Normal rate, regular rhythm and normal heart sounds.   Pulmonary/Chest: Breath sounds normal.  Abdominal: Soft. Bowel sounds are normal. There is no tenderness.          Assessment & Plan:  Uncomplicated cystitis. Urine culture sent. Cipro 500 mg twice daily for 7 days

## 2011-06-20 NOTE — Patient Instructions (Signed)
Urinary Tract Infection (UTI) Infections of the urinary tract can start in several places. A bladder infection (cystitis), a kidney infection (pyelonephritis), and a prostate infection (prostatitis) are different types of urinary tract infections. They usually get better if treated with medicines (antibiotics) that kill germs. Take all the medicine until it is gone. You or your child may feel better in a few days, but TAKE ALL MEDICINE or the infection may not respond and may become more difficult to treat. HOME CARE INSTRUCTIONS  Drink enough water and fluids to keep the urine clear or pale yellow. Cranberry juice is especially recommended, in addition to large amounts of water.   Avoid caffeine, tea, and carbonated beverages. They tend to irritate the bladder.   Alcohol may irritate the prostate.   Only take over-the-counter or prescription medicines for pain, discomfort, or fever as directed by your caregiver.  FINDING OUT THE RESULTS OF YOUR TEST Not all test results are available during your visit. If your or your child's test results are not back during the visit, make an appointment with your caregiver to find out the results. Do not assume everything is normal if you have not heard from your caregiver or the medical facility. It is important for you to follow up on all test results. TO PREVENT FURTHER INFECTIONS:  Empty the bladder often. Avoid holding urine for long periods of time.   After a bowel movement, women should cleanse from front to back. Use each tissue only once.   Empty the bladder before and after sexual intercourse.  SEEK MEDICAL CARE IF:  There is back pain.   You or your child has an oral temperature above 102.   Your baby is older than 3 months with a rectal temperature of 100.5 F (38.1 C) or higher for more than 1 day.   Your or your child's problems (symptoms) are no better in 3 days. Return sooner if you or your child is getting worse.  SEEK IMMEDIATE  MEDICAL CARE IF:  There is severe back pain or lower abdominal pain.   You or your child develops chills.   You or your child has an oral temperature above 102, not controlled by medicine.   Your baby is older than 3 months with a rectal temperature of 102 F (38.9 C) or higher.   Your baby is 3 months old or younger with a rectal temperature of 100.4 F (38 C) or higher.   There is nausea or vomiting.   There is continued burning or discomfort with urination.  MAKE SURE YOU:  Understand these instructions.   Will watch this condition.   Will get help right away if you or your child is not doing well or gets worse.  Document Released: 06/06/2005 Document Re-Released: 11/21/2009 [employer] Patient Information 2011 [employer]. 

## 2011-06-23 LAB — URINE CULTURE: Colony Count: 50000

## 2011-06-25 ENCOUNTER — Other Ambulatory Visit: Payer: Self-pay | Admitting: Family Medicine

## 2011-06-25 MED ORDER — CEPHALEXIN 500 MG PO CAPS: 500.0000 mg | ORAL_CAPSULE | Freq: Three times a day (TID) | ORAL | Status: AC

## 2011-06-25 NOTE — Progress Notes (Signed)
Quick Note:  Pt informed on home personally identified VM, will send new Rx to pharmacy ______

## 2011-07-04 ENCOUNTER — Ambulatory Visit (INDEPENDENT_AMBULATORY_CARE_PROVIDER_SITE_OTHER): Payer: Medicare Other | Admitting: Family Medicine

## 2011-07-04 ENCOUNTER — Encounter: Payer: Self-pay | Admitting: Family Medicine

## 2011-07-04 VITALS — BP 140/78 | Temp 98.7°F | Wt 202.0 lb

## 2011-07-04 DIAGNOSIS — L719 Rosacea, unspecified: Secondary | ICD-10-CM

## 2011-07-04 DIAGNOSIS — F329 Major depressive disorder, single episode, unspecified: Secondary | ICD-10-CM

## 2011-07-04 NOTE — Progress Notes (Signed)
  Subjective:    Patient ID: Sara Logan, female    DOB: Nov 28, 1932, 77 y.o.   MRN: [id number]  HPI  Patient seen for medical followup. Recent referral to dermatologist. Skin biopsy right face revealed rosacea. She has not yet started doxycycline. Her urine symptoms have cleared from recent UTI. Resistant to Cipro. Improved with Keflex.  History of depression. Takes Effexor XR 75 mg. Mostly stable but still has some occasional breakthrough depressive symptoms. She is not interested in changing medication at this time.   Review of Systems  Constitutional: Negative for fever and chills.  Respiratory: Negative for cough.   Cardiovascular: Negative for chest pain.  Genitourinary: Negative for dysuria.  Neurological: Negative for dizziness and headaches.       Objective:   Physical Exam  Constitutional: She appears well-developed and well-nourished.  HENT:  Mouth/Throat: Oropharynx is clear and moist.  Neck: Neck supple. No thyromegaly present.  Cardiovascular: Normal rate and regular rhythm.   Pulmonary/Chest: Effort normal and breath sounds normal. No respiratory distress. She has no wheezes. She has no rales.          Assessment & Plan:  #1 recent UTI. Symptomatically improved #2 rosacea treated by dermatology  #3 depression. Stable.

## 2011-07-05 DIAGNOSIS — L719 Rosacea, unspecified: Secondary | ICD-10-CM | POA: Insufficient documentation

## 2011-07-16 ENCOUNTER — Encounter: Payer: Self-pay | Admitting: Family Medicine

## 2011-07-16 ENCOUNTER — Ambulatory Visit (INDEPENDENT_AMBULATORY_CARE_PROVIDER_SITE_OTHER): Payer: Medicare Other | Admitting: Family Medicine

## 2011-07-16 VITALS — BP 150/70 | Temp 99.1°F | Wt 204.0 lb

## 2011-07-16 DIAGNOSIS — R300 Dysuria: Secondary | ICD-10-CM

## 2011-07-16 LAB — POCT URINALYSIS DIPSTICK
Bilirubin, UA: NEGATIVE
Glucose, UA: NEGATIVE
Ketones, UA: NEGATIVE
Leukocytes, UA: NEGATIVE
Nitrite, UA: NEGATIVE
Protein, UA: NEGATIVE
Spec Grav, UA: >=1.03
Urobilinogen, UA: 0.2
pH, UA: 5

## 2011-07-16 MED ORDER — CEPHALEXIN 500 MG PO CAPS: 500.0000 mg | ORAL_CAPSULE | Freq: Three times a day (TID) | ORAL | Status: AC

## 2011-07-16 NOTE — Progress Notes (Signed)
  Subjective:    Patient ID: Sara Logan, female    DOB: [birth year], 77 y.o.   MRN: [id number]  HPI  Patient seen with one-day history of urine frequency and burning. Possible low-grade fever yesterday. Symptoms actually improved today. Had recent Escherichia coli UTI which was resistant to Cipro. Those symptoms fully cleared after Keflex. She denies any fever today. No back pain. No nausea or vomiting. No gross hematuria   Review of Systems  Constitutional: Negative for fever, chills and appetite change.  Gastrointestinal: Negative for nausea, vomiting, abdominal pain, diarrhea and constipation.  Genitourinary: Positive for dysuria and frequency.  Musculoskeletal: Negative for back pain.  Neurological: Negative for dizziness.       Objective:   Physical Exam  Constitutional: She is oriented to person, place, and time. She appears well-developed and well-nourished.  Cardiovascular: Normal rate and regular rhythm.   Pulmonary/Chest: Effort normal and breath sounds normal. No respiratory distress. She has no wheezes. She has no rales.  Neurological: She is alert and oriented to person, place, and time.          Assessment & Plan:  Dysuria. Rule out UTI. Hematuria on dipstick otherwise clear. Send urine culture. Cover with Keflex 500 mg 3 times a day pending culture results. If culture negative will need repeat urine to rule out persistent hematuria

## 2011-07-19 LAB — URINE CULTURE: Colony Count: 50000

## 2011-07-19 NOTE — Progress Notes (Signed)
Quick Note:  Pt informed ______ 

## 2011-08-17 ENCOUNTER — Other Ambulatory Visit: Payer: Self-pay | Admitting: Family Medicine

## 2011-08-17 DIAGNOSIS — Z1231 Encounter for screening mammogram for malignant neoplasm of breast: Secondary | ICD-10-CM

## 2011-09-13 ENCOUNTER — Ambulatory Visit
Admission: RE | Admit: 2011-09-13 | Discharge: 2011-09-13 | Disposition: A | Discharge status: Home / Self Care | Payer: Medicare Other | Source: Ambulatory Visit | Attending: Family Medicine | Admitting: Family Medicine

## 2011-09-13 DIAGNOSIS — Z1231 Encounter for screening mammogram for malignant neoplasm of breast: Secondary | ICD-10-CM | POA: Diagnosis not present

## 2011-09-14 ENCOUNTER — Encounter: Payer: Self-pay | Admitting: Family Medicine

## 2011-09-14 ENCOUNTER — Ambulatory Visit (INDEPENDENT_AMBULATORY_CARE_PROVIDER_SITE_OTHER): Payer: Medicare Other | Admitting: Family Medicine

## 2011-09-14 DIAGNOSIS — N390 Urinary tract infection, site not specified: Secondary | ICD-10-CM

## 2011-09-14 DIAGNOSIS — R300 Dysuria: Secondary | ICD-10-CM

## 2011-09-14 LAB — POCT URINALYSIS DIPSTICK
Bilirubin, UA: 1.02
Glucose, UA: 5
Protein, UA: 0.2
Spec Grav, UA: >=1.03
Urobilinogen, UA: NEGATIVE
pH, UA: 6

## 2011-09-14 MED ORDER — CEPHALEXIN 500 MG PO CAPS: 500.0000 mg | ORAL_CAPSULE | Freq: Three times a day (TID) | ORAL | Status: AC

## 2011-09-14 NOTE — Progress Notes (Signed)
  Subjective:    Patient ID: Sara Logan, female    DOB: [birth year], 78 y.o.   MRN: [id number]  HPI  Almost one-week history of urine frequency and intermittent burning. Denies any nausea, vomiting, or diarrhea. No abdominal pain or flank pain. She had Escherichia coli UTI back in October which was treated successfully and Klebsiella pneumonia UTI back in November. Generally drinks lots of fluids. Has very few medical problems. History of mild hyperlipidemia and recurrent depression.   Review of Systems  Constitutional: Negative for fever, chills and appetite change.  Gastrointestinal: Negative for nausea, vomiting, abdominal pain, diarrhea and constipation.  Genitourinary: Positive for dysuria and frequency. Negative for hematuria.  Musculoskeletal: Negative for back pain.  Neurological: Negative for dizziness.       Objective:   Physical Exam  Constitutional: She is oriented to person, place, and time. She appears well-developed and well-nourished. No distress.  HENT:  Mouth/Throat: Oropharynx is clear and moist.  Cardiovascular: Normal rate and regular rhythm.   Pulmonary/Chest: Effort normal and breath sounds normal. No respiratory distress. She has no wheezes. She has no rales.  Neurological: She is alert and oriented to person, place, and time.          Assessment & Plan:  Dysuria. Probable recurrent uncomplicated cystitis. Urine culture sent. Keflex 500 mg 3 times a day for 7 days.  If she has another recurrence soon consider prophylaxis with trimethoprim

## 2011-09-17 LAB — URINE CULTURE: Colony Count: 30000

## 2011-09-18 NOTE — Progress Notes (Signed)
Quick Note:  Pt informed on home VM ______ 

## 2011-09-19 DIAGNOSIS — F341 Dysthymic disorder: Secondary | ICD-10-CM | POA: Diagnosis not present

## 2011-09-27 DIAGNOSIS — F341 Dysthymic disorder: Secondary | ICD-10-CM | POA: Diagnosis not present

## 2011-10-17 DIAGNOSIS — H268 Other specified cataract: Secondary | ICD-10-CM | POA: Insufficient documentation

## 2011-10-17 DIAGNOSIS — H2510 Age-related nuclear cataract, unspecified eye: Secondary | ICD-10-CM | POA: Diagnosis not present

## 2011-10-30 ENCOUNTER — Ambulatory Visit: Payer: Medicare Other | Admitting: Family Medicine

## 2011-10-31 DIAGNOSIS — M66369 Spontaneous rupture of flexor tendons, unspecified lower leg: Secondary | ICD-10-CM | POA: Diagnosis not present

## 2011-12-06 DIAGNOSIS — M25469 Effusion, unspecified knee: Secondary | ICD-10-CM | POA: Diagnosis not present

## 2011-12-06 DIAGNOSIS — M25569 Pain in unspecified knee: Secondary | ICD-10-CM | POA: Diagnosis not present

## 2011-12-11 DIAGNOSIS — M25569 Pain in unspecified knee: Secondary | ICD-10-CM | POA: Diagnosis not present

## 2011-12-28 DIAGNOSIS — M25579 Pain in unspecified ankle and joints of unspecified foot: Secondary | ICD-10-CM | POA: Diagnosis not present

## 2012-01-03 DIAGNOSIS — M25579 Pain in unspecified ankle and joints of unspecified foot: Secondary | ICD-10-CM | POA: Diagnosis not present

## 2012-01-07 ENCOUNTER — Other Ambulatory Visit: Payer: Self-pay | Admitting: Family Medicine

## 2012-01-23 DIAGNOSIS — M25569 Pain in unspecified knee: Secondary | ICD-10-CM | POA: Diagnosis not present

## 2012-01-24 ENCOUNTER — Encounter: Payer: Medicare Other | Admitting: Family Medicine

## 2012-01-24 ENCOUNTER — Other Ambulatory Visit (HOSPITAL_COMMUNITY): Payer: Self-pay | Admitting: Specialist

## 2012-01-24 DIAGNOSIS — M25561 Pain in right knee: Secondary | ICD-10-CM

## 2012-01-30 ENCOUNTER — Encounter (HOSPITAL_COMMUNITY)
Admission: RE | Admit: 2012-01-30 | Discharge: 2012-01-30 | Disposition: A | Discharge status: Home / Self Care | Payer: Medicare Other | Source: Ambulatory Visit | Attending: Specialist | Admitting: Specialist

## 2012-01-30 ENCOUNTER — Ambulatory Visit (HOSPITAL_COMMUNITY)
Admission: RE | Admit: 2012-01-30 | Discharge: 2012-01-30 | Disposition: A | Discharge status: Home / Self Care | Payer: Medicare Other | Source: Ambulatory Visit | Attending: Specialist | Admitting: Specialist

## 2012-01-30 ENCOUNTER — Encounter (HOSPITAL_COMMUNITY): Payer: Self-pay

## 2012-01-30 DIAGNOSIS — Z96659 Presence of unspecified artificial knee joint: Secondary | ICD-10-CM | POA: Insufficient documentation

## 2012-01-30 DIAGNOSIS — M25561 Pain in right knee: Secondary | ICD-10-CM

## 2012-01-30 DIAGNOSIS — M25569 Pain in unspecified knee: Secondary | ICD-10-CM | POA: Diagnosis not present

## 2012-01-30 MED ORDER — TECHNETIUM TC 99M MEDRONATE IV KIT
23.7000 | PACK | Freq: Once | INTRAVENOUS | Status: AC | PRN
Start: 2012-01-30 — End: 2012-01-30
  Administered 2012-01-30: 23.7 via INTRAVENOUS

## 2012-01-31 DIAGNOSIS — M25579 Pain in unspecified ankle and joints of unspecified foot: Secondary | ICD-10-CM | POA: Diagnosis not present

## 2012-01-31 DIAGNOSIS — M25569 Pain in unspecified knee: Secondary | ICD-10-CM | POA: Diagnosis not present

## 2012-02-06 ENCOUNTER — Encounter: Payer: Self-pay | Admitting: Family Medicine

## 2012-02-06 ENCOUNTER — Ambulatory Visit (INDEPENDENT_AMBULATORY_CARE_PROVIDER_SITE_OTHER): Payer: Medicare Other | Admitting: Family Medicine

## 2012-02-06 VITALS — BP 130/80 | HR 92 | Temp 98.2°F | Resp 16 | Wt 199.5 lb

## 2012-02-06 DIAGNOSIS — F3289 Other specified depressive episodes: Secondary | ICD-10-CM | POA: Diagnosis not present

## 2012-02-06 DIAGNOSIS — I878 Other specified disorders of veins: Secondary | ICD-10-CM

## 2012-02-06 DIAGNOSIS — Z0000 Encounter for general adult medical examination without abnormal findings: Secondary | ICD-10-CM | POA: Diagnosis not present

## 2012-02-06 DIAGNOSIS — F329 Major depressive disorder, single episode, unspecified: Secondary | ICD-10-CM

## 2012-02-06 DIAGNOSIS — E559 Vitamin D deficiency, unspecified: Secondary | ICD-10-CM

## 2012-02-06 DIAGNOSIS — I872 Venous insufficiency (chronic) (peripheral): Secondary | ICD-10-CM | POA: Diagnosis not present

## 2012-02-06 NOTE — Progress Notes (Signed)
Subjective:    Patient ID: Sara Logan, female    DOB: [birth year], 78 y.o.   MRN: [id number]  HPI  Patient seen for Medicare wellness exam and medical followup. Her past medical history significant for recurrent depression, rosacea, and vitamin D deficiency. She takes meloxicam for osteoarthritis and Effexor XR for history of depression. She's recently had stress of husband who has advanced Parkinson's disease with complication of infection (chest wall) related to his brain stimulator. He is currently in nursing home type care. This has been very difficult for her.  Immunizations are up-to-date. Mammogram within the past 6 months normal.  Past Medical History  Diagnosis Date  . Arthritis   . Depression   . Hyperlipidemia   . Osteopenia   . Thrombocytopenia   . Vitamin d deficiency   . Wrist fracture 2002    left   Past Surgical History  Procedure Date  . Joint replacement 2002    right total   . Carpal tunnel release 2008    right  . Appendectomy 1967    reports that she has never smoked. She does not have any smokeless tobacco history on file. She reports that she does not drink alcohol or use illicit drugs. family history includes Cancer in her mother and sister. No Known Allergies  1.  Risk factors based on Past Medical , Social, and Family history reviewed as indicated above 2.  Limitations in physical activities low risk for falls. No consistent exercise. 3.  Depression/mood history of recurrent depression currently stable on medication above 4.  Hearing she has history of some chronic hearing loss. She has hearing aids and sees audiologist for that 5.  ADLs fully independent in all 6.  Cognitive function (orientation to time and place, language, writing, speech,memory) no memory deficits. Speech and language intact 7.  Home Safety no issues identified 8.  Height, weight, and visual acuity. No recent changes 9.  Counseling discuss strategies for coping with stress.  Discussed regular weightbearing activity such as walking. Continue calcium and vitamin D intake. 10. Recommendation of preventive services. Repeat vitamin D level. Immunizations up-to-date. Continue regular mammogram 11. Labs based on risk factors vitamin D level 12. Care Plan as above.     Review of Systems  Constitutional: Negative for fever, activity change, appetite change, fatigue and unexpected weight change.  HENT: Negative for hearing loss, ear pain, sore throat and trouble swallowing.   Eyes: Negative for visual disturbance.  Respiratory: Negative for cough, shortness of breath and wheezing.   Cardiovascular: Negative for chest pain and palpitations.  Gastrointestinal: Negative for abdominal pain, diarrhea, constipation and blood in stool.  Genitourinary: Negative for dysuria and hematuria.  Musculoskeletal: Positive for arthralgias. Negative for myalgias and back pain.  Skin: Negative for rash.  Neurological: Negative for dizziness, seizures, syncope, weakness and headaches.  Hematological: Negative for adenopathy.  Psychiatric/Behavioral: Positive for dysphoric mood. Negative for suicidal ideas and confusion.       Objective:   Physical Exam  Constitutional: She is oriented to person, place, and time. She appears well-developed and well-nourished.  HENT:  Right Ear: External ear normal.  Left Ear: External ear normal.  Mouth/Throat: Oropharynx is clear and moist.  Neck: Neck supple. No thyromegaly present.  Cardiovascular: Normal rate and regular rhythm.   Pulmonary/Chest: Effort normal and breath sounds normal. No respiratory distress. She has no wheezes. She has no rales.  Abdominal: Soft. Bowel sounds are normal. She exhibits no distension and no mass. There is  no tenderness. There is no rebound and no guarding.  Genitourinary:       Breasts are symmetric with no mass  Musculoskeletal: She exhibits no edema.  Lymphadenopathy:    She has no cervical adenopathy.    Neurological: She is alert and oriented to person, place, and time. No cranial nerve deficit.  Skin:       Patient has macular erythematous rash right chin region. This been evaluated by dermatologist and diagnosis is rosacea  Psychiatric: She has a normal mood and affect. Her behavior is normal. Judgment and thought content normal.          Assessment & Plan:  #1 health maintenance. Immunizations up-to-date. Continue yearly mammogram. Continue calcium and vitamin D supplement we discussed appropriate levels. Repeat vitamin D level #2 history of recurrent depression stable continue Effexor XR  #3 osteoarthritis. Patient remains on meloxicam as needed #4 history of vitamin D deficiency. Recheck vitamin D levels. She is no consistent supplementation over the past year

## 2012-02-07 LAB — VITAMIN D 25 HYDROXY (VIT D DEFICIENCY, FRACTURES): Vit D, 25-Hydroxy: 36 ng/mL (ref 30–89)

## 2012-03-06 ENCOUNTER — Ambulatory Visit (INDEPENDENT_AMBULATORY_CARE_PROVIDER_SITE_OTHER): Payer: Medicare Other | Admitting: Family Medicine

## 2012-03-06 ENCOUNTER — Encounter: Payer: Self-pay | Admitting: Family Medicine

## 2012-03-06 VITALS — BP 138/78 | Temp 98.0°F | Wt 200.0 lb

## 2012-03-06 DIAGNOSIS — R300 Dysuria: Secondary | ICD-10-CM | POA: Diagnosis not present

## 2012-03-06 LAB — POCT URINALYSIS DIPSTICK
Bilirubin, UA: NEGATIVE
Glucose, UA: NEGATIVE
Ketones, UA: NEGATIVE
Nitrite, UA: NEGATIVE
Spec Grav, UA: >=1.03
Urobilinogen, UA: 0.2
pH, UA: 6

## 2012-03-06 MED ORDER — CEPHALEXIN 500 MG PO CAPS: 500.0000 mg | ORAL_CAPSULE | Freq: Three times a day (TID) | ORAL | Status: AC

## 2012-03-06 NOTE — Progress Notes (Signed)
  Subjective:    Patient ID: Sara Logan, female    DOB: 1933-07-27, 78 y.o.   MRN: [id number]  HPI  Acute visit. One and one half week history or frequency and mild burning, especially the past day. Denies any fever. No nausea or vomiting. No back pain. She had low colony count Escherichia coli back in January treated with cephalexin. That culture was resistant to quinolones including Cipro and Levaquin. Good fluid intake. No abdominal pain.   Review of Systems  Constitutional: Negative for fever, chills and appetite change.  Gastrointestinal: Negative for nausea, vomiting, abdominal pain, diarrhea and constipation.  Genitourinary: Positive for dysuria and frequency.  Musculoskeletal: Negative for back pain.  Neurological: Negative for dizziness.       Objective:   Physical Exam  Constitutional: She appears well-developed and well-nourished.  HENT:  Head: Normocephalic and atraumatic.  Neck: Neck supple. No thyromegaly present.  Cardiovascular: Normal rate, regular rhythm and normal heart sounds.   Pulmonary/Chest: Breath sounds normal.          Assessment & Plan:  Dysuria. Urine dipstick suggests UTI. She has positive leukocytes and blood but no nitrites. Urine culture sent given her age. Start cephalexin 500 mg 3 times a day pending culture results. Plenty of fluids. Followup as needed.

## 2012-03-06 NOTE — Patient Instructions (Signed)

## 2012-03-07 DIAGNOSIS — R300 Dysuria: Secondary | ICD-10-CM | POA: Diagnosis not present

## 2012-03-10 LAB — URINE CULTURE: Colony Count: >=100000

## 2012-03-11 NOTE — Progress Notes (Signed)
Quick Note:  Pt informed ______ 

## 2012-03-25 DIAGNOSIS — F341 Dysthymic disorder: Secondary | ICD-10-CM | POA: Diagnosis not present

## 2012-04-02 ENCOUNTER — Encounter: Payer: Self-pay | Admitting: Family

## 2012-04-02 ENCOUNTER — Ambulatory Visit (INDEPENDENT_AMBULATORY_CARE_PROVIDER_SITE_OTHER): Payer: Medicare Other | Admitting: Family

## 2012-04-02 VITALS — BP 170/80 | Temp 98.5°F | Wt 198.0 lb

## 2012-04-02 DIAGNOSIS — N390 Urinary tract infection, site not specified: Secondary | ICD-10-CM | POA: Diagnosis not present

## 2012-04-02 DIAGNOSIS — R300 Dysuria: Secondary | ICD-10-CM | POA: Diagnosis not present

## 2012-04-02 LAB — POCT URINALYSIS DIPSTICK
Glucose, UA: NEGATIVE
Nitrite, UA: NEGATIVE
Spec Grav, UA: 1.025
Urobilinogen, UA: 1
pH, UA: 5.5

## 2012-04-02 MED ORDER — CIPROFLOXACIN HCL 250 MG PO TABS: 250.0000 mg | ORAL_TABLET | Freq: Two times a day (BID) | ORAL | Status: AC

## 2012-04-02 NOTE — Progress Notes (Signed)
  Subjective:    Patient ID: Sara Logan, female    DOB: 10-14-1932, 78 y.o.   MRN: [id number]  HPI 78 year old white female, nonsmoker, patient of Dr. Caryl Never is in today with complaints of urinary frequency, urgency, burning with urination. She was treated by Dr. Caryl Never for urinary tract infection to have a positive culture with cephalexin. Reports his symptoms never completely resolved. Patient's and her troponins amount of stress with her husband who has Parkinson's disease. She has some financial stressors as well giving her husband's Medicare's run out.   Review of Systems  Constitutional: Negative.   Respiratory: Negative.   Gastrointestinal: Negative.   Genitourinary: Positive for dysuria, urgency and frequency.  Musculoskeletal: Negative.   Hematological: Negative.   Psychiatric/Behavioral: Negative for suicidal ideas. The patient is nervous/anxious.    Past Medical History  Diagnosis Date  . Arthritis   . Depression   . Hyperlipidemia   . Osteopenia   . Thrombocytopenia   . Vitamin d deficiency   . Wrist fracture 2002    left    History   Social History  . Marital Status: Married    Spouse Name: N/A    Number of Children: N/A  . Years of Education: N/A   Occupational History  . Not on file.   Social History Main Topics  . Smoking status: Never Smoker   . Smokeless tobacco: Not on file  . Alcohol Use: No  . Drug Use: No  . Sexually Active:    Other Topics Concern  . Not on file   Social History Narrative  . No narrative on file    Past Surgical History  Procedure Date  . Joint replacement 2002    right total   . Carpal tunnel release 2008    right  . Appendectomy 1967    Family History  Problem Relation Age of Onset  . Cancer Mother     breast  . Cancer Sister     breast    No Known Allergies  Current Outpatient Prescriptions on File Prior to Visit  Medication Sig Dispense Refill  . Cholecalciferol (D3 DOTS) 2000 UNITS TBDP  Take by mouth daily.        . EFFEXOR XR 75 MG 24 hr capsule TAKE 1 CAPSULE DAILY.  90 each  3  . meloxicam (MOBIC) 15 MG tablet Take 15 mg by mouth daily.        BP 170/80  Temp 98.5 F (36.9 C) (Oral)  Wt 198 lb (89.812 kg)chart    Objective:   Physical Exam  Constitutional: She is oriented to person, place, and time. She appears well-developed and well-nourished.  Cardiovascular: Normal rate, regular rhythm and normal heart sounds.   Pulmonary/Chest: Effort normal and breath sounds normal.  Abdominal: Soft. Bowel sounds are normal.  Neurological: She is alert and oriented to person, place, and time.  Skin: Skin is warm and dry.  Psychiatric: She has a normal mood and affect.          Assessment & Plan:  Assessment: Urinary tract infection, depression  Plan: Cipro 250 mg twice a day x7 days. Appears patient is sensitive to Cipro. Drink plenty of fluids. Consider increase in Effexor. Patient call the office if symptoms worsen or persist. Recheck a schedule, and when necessary.

## 2012-04-02 NOTE — Addendum Note (Signed)
Addended by: Kristian Covey on: 04/02/2012 05:31 PM   Modules accepted: Orders

## 2012-04-02 NOTE — Patient Instructions (Addendum)

## 2012-04-03 ENCOUNTER — Telehealth: Payer: Self-pay | Admitting: Family Medicine

## 2012-04-03 MED ORDER — VENLAFAXINE HCL ER 75 MG PO CP24: 150.0000 mg | ORAL_CAPSULE | Freq: Every day | ORAL | Status: DC

## 2012-04-03 NOTE — Telephone Encounter (Signed)
Would take together, though simpler to take 150 mg XR one daily

## 2012-04-03 NOTE — Telephone Encounter (Signed)
Change to med list - new rx sent to [facility].

## 2012-04-03 NOTE — Telephone Encounter (Signed)
Please advise 

## 2012-04-03 NOTE — Telephone Encounter (Signed)
Caller: Sara Logan/Patient; PCP: Evelena Peat; CB#: [phone]; REQUESTING increase dose of Effexor FROM 75MG S TO 150 MGS daily as discussed with NP Orvan Falconer on 04/02/12. She Prefers To Have It Prescribed As 75mg s 2 Tabs PO Daily, not sure if needs to take one morning and night or both together. She may want to go back down To 75 Mgs in a month or two- under alot of stress at the present time. Uses [facility]- [employer] [employer]. PLEASE HAVE PHARMACY LET HER KNOW WHEN RX READY FOR PICK UP. MEDICATION QUESTIONS PROTOCOL.

## 2012-04-05 LAB — URINE CULTURE: Colony Count: >=100000

## 2012-04-14 DIAGNOSIS — L719 Rosacea, unspecified: Secondary | ICD-10-CM | POA: Diagnosis not present

## 2012-04-23 DIAGNOSIS — H268 Other specified cataract: Secondary | ICD-10-CM | POA: Diagnosis not present

## 2012-04-23 DIAGNOSIS — H2510 Age-related nuclear cataract, unspecified eye: Secondary | ICD-10-CM | POA: Diagnosis not present

## 2012-05-14 ENCOUNTER — Encounter: Payer: Self-pay | Admitting: Family Medicine

## 2012-05-14 ENCOUNTER — Ambulatory Visit (INDEPENDENT_AMBULATORY_CARE_PROVIDER_SITE_OTHER): Payer: Medicare Other | Admitting: Family Medicine

## 2012-05-14 VITALS — BP 140/78 | Temp 98.4°F | Wt 194.0 lb

## 2012-05-14 DIAGNOSIS — R5383 Other fatigue: Secondary | ICD-10-CM | POA: Diagnosis not present

## 2012-05-14 DIAGNOSIS — R1013 Epigastric pain: Secondary | ICD-10-CM

## 2012-05-14 DIAGNOSIS — R5381 Other malaise: Secondary | ICD-10-CM

## 2012-05-14 DIAGNOSIS — R531 Weakness: Secondary | ICD-10-CM

## 2012-05-14 LAB — CBC WITH DIFFERENTIAL/PLATELET
Basophils Absolute: 0 10*3/uL (ref 0.0–0.1)
Basophils Relative: 0.4 % (ref 0.0–3.0)
Eosinophils Absolute: 0.2 10*3/uL (ref 0.0–0.7)
Eosinophils Relative: 2.9 % (ref 0.0–5.0)
HCT: 37.9 % (ref 36.0–46.0)
Hemoglobin: 12.5 g/dL (ref 12.0–15.0)
Lymphocytes Relative: 19.5 % (ref 12.0–46.0)
Lymphs Abs: 1.5 10*3/uL (ref 0.7–4.0)
MCHC: 33.1 g/dL (ref 30.0–36.0)
MCV: 87.9 fl (ref 78.0–100.0)
Monocytes Absolute: 0.8 10*3/uL (ref 0.1–1.0)
Monocytes Relative: 10.1 % (ref 3.0–12.0)
Neutro Abs: 5.1 10*3/uL (ref 1.4–7.7)
Neutrophils Relative %: 67.1 % (ref 43.0–77.0)
Platelets: 139 10*3/uL — ABNORMAL LOW (ref 150.0–400.0)
RBC: 4.31 Mil/uL (ref 3.87–5.11)
RDW: 13.8 % (ref 11.5–14.6)
WBC: 7.6 10*3/uL (ref 4.5–10.5)

## 2012-05-14 LAB — BASIC METABOLIC PANEL
BUN: 26 mg/dL — ABNORMAL HIGH (ref 6–23)
CO2: 28 mEq/L (ref 19–32)
Calcium: 9.3 mg/dL (ref 8.4–10.5)
Chloride: 100 mEq/L (ref 96–112)
Creatinine, Ser: 0.8 mg/dL (ref 0.4–1.2)
GFR: 72.75 mL/min (ref >60.00)
Glucose, Bld: 105 mg/dL — ABNORMAL HIGH (ref 70–99)
Potassium: 3.2 mEq/L — ABNORMAL LOW (ref 3.5–5.1)
Sodium: 135 mEq/L (ref 135–145)

## 2012-05-14 LAB — TSH: TSH: 0.82 u[IU]/mL (ref 0.35–5.50)

## 2012-05-14 NOTE — Patient Instructions (Addendum)
Hold meloxicam for now Try Zantac or Pepcid for abdominal symptoms.

## 2012-05-14 NOTE — Progress Notes (Signed)
  Subjective:    Patient ID: Sara Logan, female    DOB: [birth year], 79 y.o.   MRN: [id number]  HPI  Patient seen with complaints of generalized weakness over the past couple weeks. She is concerned she may have low iron. She has not had any history of ulcers and has not noted any melena. She has had some epigastric discomfort intermittently. Symptoms are relatively mild. Sometimes improved with food such as yogurt. She does take meloxicam fairly regularly per orthopedist. Abdominal pain is sometimes exacerbated by certain foods. No right upper quadrant food. No chest pain. Increased stress with dealing with husband's illness recently. Patient has taken tums at times which seems to help somewhat.  She feels generally weak but no focal weakness. No appetite or weight changes. No headaches. No dyspnea. No dysuria.  Past Medical History  Diagnosis Date  . Arthritis   . Depression   . Hyperlipidemia   . Osteopenia   . Thrombocytopenia   . Vitamin d deficiency   . Wrist fracture 2002    left   Past Surgical History  Procedure Date  . Joint replacement 2002    right total   . Carpal tunnel release 2008    right  . Appendectomy 1967    reports that she has never smoked. She does not have any smokeless tobacco history on file. She reports that she does not drink alcohol or use illicit drugs. family history includes Cancer in her mother and sister. No Known Allergies    Review of Systems  Respiratory: Negative for cough and shortness of breath.   Cardiovascular: Negative for chest pain, palpitations and leg swelling.  Gastrointestinal: Positive for abdominal pain. Negative for nausea, vomiting, blood in stool and abdominal distention.  Neurological: Positive for weakness. Negative for dizziness, syncope and headaches.       Objective:   Physical Exam  Constitutional: She is oriented to person, place, and time. She appears well-developed and well-nourished.  Cardiovascular:  Normal rate and regular rhythm.   Pulmonary/Chest: Effort normal and breath sounds normal. No respiratory distress. She has no wheezes. She has no rales.  Abdominal: Soft. She exhibits no mass. There is no rebound and no guarding.       Minimal tenderness epigastric region  Musculoskeletal: She exhibits no edema.  Neurological: She is alert and oriented to person, place, and time. No cranial nerve deficit.       No focal strength deficits. Good grip strength bilaterally          Assessment & Plan:  Patient presents with some generalized weakness and malaise. She is concerned about iron deficiency. No history of known anemia. She has not had any orthostatic symptoms. Check basic metabolic panel, CBC, and TSH.  Epigastric abdominal pain. Question gastritis. Hold Meloxicam for now. Try over-the-counter Zantac or Pepcid. Touch base if symptoms not fully resolving over the next couple of weeks

## 2012-05-15 NOTE — Progress Notes (Signed)
Quick Note:  Called and spoke with pt and pt is aware. ______ 

## 2012-05-15 NOTE — Progress Notes (Signed)
Quick Note:  Left a message for pt to return call. ______ 

## 2012-05-16 ENCOUNTER — Telehealth: Payer: Self-pay | Admitting: *Deleted

## 2012-05-16 NOTE — Telephone Encounter (Signed)
Pt called reporting chills, fever, body aches following OV and lab draw 2 days ago, wondering if having labs drawn could cause such sx.  My reply was no.  She is feeling some better now today.  Pt requested copy of recent labs and I included high potassium diet sheet.

## 2012-05-21 ENCOUNTER — Encounter: Payer: Self-pay | Admitting: Family Medicine

## 2012-05-21 ENCOUNTER — Ambulatory Visit (INDEPENDENT_AMBULATORY_CARE_PROVIDER_SITE_OTHER): Payer: Medicare Other | Admitting: Family Medicine

## 2012-05-21 VITALS — BP 150/70 | HR 76 | Temp 98.0°F | Resp 12 | Wt 192.0 lb

## 2012-05-21 DIAGNOSIS — R509 Fever, unspecified: Secondary | ICD-10-CM | POA: Diagnosis not present

## 2012-05-21 MED ORDER — LEVOFLOXACIN 500 MG PO TABS: 500.0000 mg | ORAL_TABLET | Freq: Every day | ORAL | Status: DC

## 2012-05-21 NOTE — Progress Notes (Signed)
  Subjective:    Patient ID: Sara Logan, female    DOB: 20-Jan-1933, 78 y.o.   MRN: [id number]  HPI  Patient seen with one week history of fatigue, intermittent body aches, possible intermittent fever and chills. She's had some sweats off and on. Refer to prior note. Was seen one week ago and had some vague epigastric discomfort. We stopped her meloxicam and started Zantac and those symptoms have improved. Recent CBC unremarkable. Labs were significant for potassium 3.2 which is surprising since she does not take any diuretics. No recent nausea or vomiting. No urinary symptoms. Only rare cough. No sore throat or nasal congestion. No abdominal pain. No new rashes.  Past Medical History  Diagnosis Date  . Arthritis   . Depression   . Hyperlipidemia   . Osteopenia   . Thrombocytopenia   . Vitamin d deficiency   . Wrist fracture 2002    left   Past Surgical History  Procedure Date  . Joint replacement 2002    right total   . Carpal tunnel release 2008    right  . Appendectomy 1967    reports that she has never smoked. She does not have any smokeless tobacco history on file. She reports that she does not drink alcohol or use illicit drugs. family history includes Cancer in her mother and sister. No Known Allergies   Review of Systems  Constitutional: Positive for fever, chills and fatigue. Negative for unexpected weight change.  Respiratory: Negative for shortness of breath.   Cardiovascular: Negative for chest pain.  Gastrointestinal: Negative for nausea, vomiting, abdominal pain and diarrhea.  Genitourinary: Negative for dysuria.  Skin: Negative for rash.  Neurological: Negative for headaches.  Hematological: Negative for adenopathy.  Psychiatric/Behavioral: Negative for confusion.       Objective:   Physical Exam  Constitutional: She is oriented to person, place, and time. She appears well-developed and well-nourished.  HENT:  Right Ear: External ear normal.  Left  Ear: External ear normal.  Mouth/Throat: Oropharynx is clear and moist.  Neck: Neck supple.  Cardiovascular: Normal rate and regular rhythm.   Pulmonary/Chest: Effort normal.       Patient some faint rales left base. Right base is clear. No wheezes  Abdominal: Soft. There is no tenderness.  Musculoskeletal: She exhibits no edema.  Lymphadenopathy:    She has no cervical adenopathy.  Neurological: She is alert and oriented to person, place, and time.          Assessment & Plan:  Febrile illness though patient is afebrile this time. She has exam finding of faint rales left base with minimal cough-otherwise nonfocal exam. Setup chest x-ray. Start Levaquin 500 milligrams once daily. Patient already has followup next week to repeat BMP. She's increased potassium in her diet.

## 2012-05-22 ENCOUNTER — Ambulatory Visit (INDEPENDENT_AMBULATORY_CARE_PROVIDER_SITE_OTHER)
Admission: RE | Admit: 2012-05-22 | Discharge: 2012-05-22 | Disposition: A | Discharge status: Home / Self Care | Payer: Medicare Other | Source: Ambulatory Visit | Attending: Family Medicine | Admitting: Family Medicine

## 2012-05-22 DIAGNOSIS — R509 Fever, unspecified: Secondary | ICD-10-CM

## 2012-05-22 NOTE — Progress Notes (Signed)
Quick Note:  Pt informed, still feels about the same ______

## 2012-05-27 ENCOUNTER — Encounter: Payer: Self-pay | Admitting: Family Medicine

## 2012-05-27 ENCOUNTER — Other Ambulatory Visit: Payer: Medicare Other

## 2012-05-27 ENCOUNTER — Ambulatory Visit (INDEPENDENT_AMBULATORY_CARE_PROVIDER_SITE_OTHER): Payer: Medicare Other | Admitting: Family Medicine

## 2012-05-27 VITALS — BP 146/80 | Temp 98.3°F | Wt 194.0 lb

## 2012-05-27 DIAGNOSIS — E876 Hypokalemia: Secondary | ICD-10-CM

## 2012-05-27 DIAGNOSIS — M791 Myalgia, unspecified site: Secondary | ICD-10-CM

## 2012-05-27 DIAGNOSIS — R5381 Other malaise: Secondary | ICD-10-CM | POA: Diagnosis not present

## 2012-05-27 DIAGNOSIS — R5383 Other fatigue: Secondary | ICD-10-CM

## 2012-05-27 DIAGNOSIS — IMO0001 Reserved for inherently not codable concepts without codable children: Secondary | ICD-10-CM | POA: Diagnosis not present

## 2012-05-27 LAB — BASIC METABOLIC PANEL
BUN: 21 mg/dL (ref 6–23)
CO2: 28 mEq/L (ref 19–32)
Calcium: 9 mg/dL (ref 8.4–10.5)
Chloride: 98 mEq/L (ref 96–112)
Creatinine, Ser: 0.8 mg/dL (ref 0.4–1.2)
GFR: 73.8 mL/min (ref >60.00)
Glucose, Bld: 110 mg/dL — ABNORMAL HIGH (ref 70–99)
Potassium: 4.2 mEq/L (ref 3.5–5.1)
Sodium: 138 mEq/L (ref 135–145)

## 2012-05-27 LAB — SEDIMENTATION RATE: Sed Rate: 24 mm/hr — ABNORMAL HIGH (ref 0–22)

## 2012-05-27 NOTE — Progress Notes (Signed)
  Subjective:    Patient ID: Sara Logan, female    DOB: 10-18-1932, 78 y.o.   MRN: [id number]  HPI  Patient was seen recently for nonspecific fatigue. Labs came back significant for potassium 3.2 in a patient not on diuretic. She's been supplementing with diet and here today for repeat. She also had presented initially with some nonspecific epigastric discomfort. We had her hold meloxicam and she started Pepcid and those symptoms have essentially resolved. Last visit she reported some low-grade fever and we heard very faint crackles in her left base. Chest x-ray did not confirm any pneumonia but we treated her with Levaquin. No progressive cough. Possibly some low-grade fever but not confirmed with thermometer.  . No dysuria and recent urine revealed no evidence for UTI  She is describing couple week history of progressive achiness especially upper back and increased fatigue and possible low-grade fever. Diminished appetite but weight unchanged. Tylenol with minimal relief. Does not take any statin. No headaches or visual changes. No abdominal pain. No chest pains.  Past Medical History  Diagnosis Date  . Arthritis   . Depression   . Hyperlipidemia   . Osteopenia   . Thrombocytopenia   . Vitamin d deficiency   . Wrist fracture 2002    left   Past Surgical History  Procedure Date  . Joint replacement 2002    right total   . Carpal tunnel release 2008    right  . Appendectomy 1967    reports that she has never smoked. She does not have any smokeless tobacco history on file. She reports that she does not drink alcohol or use illicit drugs. family history includes Cancer in her mother and sister. No Known Allergies    Review of Systems  Constitutional: Positive for fever, chills, appetite change and fatigue.  Eyes: Negative for visual disturbance.  Respiratory: Negative for shortness of breath.   Cardiovascular: Negative for chest pain.  Genitourinary: Negative for dysuria.    Musculoskeletal: Positive for myalgias and back pain.  Neurological: Negative for dizziness and headaches.  Hematological: Negative for adenopathy. Does not bruise/bleed easily.       Objective:   Physical Exam  Constitutional: She is oriented to person, place, and time. She appears well-developed and well-nourished.  HENT:       No temporal artery tenderness  Neck: Neck supple. No thyromegaly present.  Cardiovascular: Normal rate and regular rhythm.   Pulmonary/Chest: Effort normal and breath sounds normal. No respiratory distress. She has no wheezes. She has no rales.  Musculoskeletal: She exhibits no edema.  Lymphadenopathy:    She has no cervical adenopathy.  Neurological: She is alert and oriented to person, place, and time.          Assessment & Plan:  Patient presents with progressive fatigue, myalgias mostly upper body and trunk and possible low-grade fever. Rule out polymyalgia rheumatica. Check sedimentation rate. If elevated consider trial of low-dose prednisone  Hypokalemia, mild. Recheck basic metabolic panel

## 2012-05-29 ENCOUNTER — Telehealth: Payer: Self-pay | Admitting: Family Medicine

## 2012-05-29 ENCOUNTER — Other Ambulatory Visit: Payer: Self-pay | Admitting: *Deleted

## 2012-05-29 MED ORDER — PREDNISONE 10 MG PO TABS: 10.0000 mg | ORAL_TABLET | Freq: Every day | ORAL | Status: DC

## 2012-05-29 NOTE — Telephone Encounter (Signed)
Pt called, rx called in

## 2012-05-29 NOTE — Telephone Encounter (Signed)
Patient calling, didn't get a call about her lab work done on Tuesday 9/17.  Please call her with the results.  She is very anxious.  She can be reached at [phone] until 1045a, then she has a dental appointment. (I saw the labs were back but abnormal and did not share this with the patient).

## 2012-05-29 NOTE — Progress Notes (Signed)
Quick Note:  Pt informed, meds called in ______

## 2012-06-03 ENCOUNTER — Ambulatory Visit (INDEPENDENT_AMBULATORY_CARE_PROVIDER_SITE_OTHER): Payer: Medicare Other | Admitting: Family Medicine

## 2012-06-03 ENCOUNTER — Encounter: Payer: Self-pay | Admitting: Family Medicine

## 2012-06-03 VITALS — BP 140/78 | Temp 98.3°F | Wt 190.0 lb

## 2012-06-03 DIAGNOSIS — R5381 Other malaise: Secondary | ICD-10-CM | POA: Diagnosis not present

## 2012-06-03 DIAGNOSIS — R5383 Other fatigue: Secondary | ICD-10-CM

## 2012-06-03 DIAGNOSIS — M791 Myalgia, unspecified site: Secondary | ICD-10-CM

## 2012-06-03 DIAGNOSIS — IMO0001 Reserved for inherently not codable concepts without codable children: Secondary | ICD-10-CM

## 2012-06-03 NOTE — Progress Notes (Signed)
  Subjective:    Patient ID: Sara Logan, female    DOB: 08/08/1933, 78 y.o.   MRN: [id number]  HPI  Patient seen with neck /upper back and trunk pain progressive over the past few weeks. Fairly sudden onset. No history of statin use. Recent thyroid function normal. Mildly elevated sedimentation rate. Even though her sedimentation rate was relatively mildly elevated we suspected possible polymyalgia. She was placed on prednisone 20 mg daily for 3 days then tapered to 10 mg. She saw about 80-90% resolution of her symptoms with low-dose prednisone. No recent visual changes. Slightly less improvement with 10 mg though still overall greatly improved. No recent recurrent fever. Appetite also been slightly diminished recently. This seems slightly improved. No real arthralgias  Past Medical History  Diagnosis Date  . Arthritis   . Depression   . Hyperlipidemia   . Osteopenia   . Thrombocytopenia   . Vitamin d deficiency   . Wrist fracture 2002    left   Past Surgical History  Procedure Date  . Joint replacement 2002    right total   . Carpal tunnel release 2008    right  . Appendectomy 1967    reports that she has never smoked. She does not have any smokeless tobacco history on file. She reports that she does not drink alcohol or use illicit drugs. family history includes Cancer in her mother and sister. No Known Allergies    Review of Systems  Constitutional: Negative for fever and chills.  Eyes: Negative for visual disturbance.  Respiratory: Negative for cough and shortness of breath.   Cardiovascular: Negative for chest pain, palpitations and leg swelling.  Gastrointestinal: Negative for abdominal pain.  Neurological: Negative for headaches.       Objective:   Physical Exam  Constitutional: She is oriented to person, place, and time. She appears well-developed and well-nourished.  Neck: Neck supple. No thyromegaly present.  Cardiovascular: Normal rate and regular rhythm.     Pulmonary/Chest: Effort normal and breath sounds normal. No respiratory distress. She has no wheezes. She has no rales.  Musculoskeletal: She exhibits no edema and no tenderness.  Neurological: She is alert and oriented to person, place, and time. No cranial nerve deficit.          Assessment & Plan:  Progressive myalgias and fatigue. Clinically, suspicion for possible polymyalgia rheumatica even though her sedimentation rate was only minimally elevated. She had very good (though not total) response to low-dose prednisone. We explained this is a clinical diagnosis. We do not have any other good valid explanation for her relatively acute onset of myalgias/fatigue. We've elected to maintain low-dose prednisone 10 mg daily and reassess 2 weeks and get fasting blood sugar then repeat sedimentation rate. Maintain adequate calcium and vitamin D. We'll need repeat DEXA scan soon

## 2012-06-03 NOTE — Patient Instructions (Signed)
Continue with prednisone 10 mg daily Make sure you are taking calcium 1,200 mg daily and Vit D at least 800 IU daily.

## 2012-06-12 DIAGNOSIS — M66369 Spontaneous rupture of flexor tendons, unspecified lower leg: Secondary | ICD-10-CM | POA: Diagnosis not present

## 2012-06-12 DIAGNOSIS — M25579 Pain in unspecified ankle and joints of unspecified foot: Secondary | ICD-10-CM | POA: Diagnosis not present

## 2012-06-12 DIAGNOSIS — M25569 Pain in unspecified knee: Secondary | ICD-10-CM | POA: Diagnosis not present

## 2012-06-17 ENCOUNTER — Ambulatory Visit: Payer: Medicare Other | Admitting: Family Medicine

## 2012-06-18 ENCOUNTER — Encounter: Payer: Self-pay | Admitting: Family Medicine

## 2012-06-18 ENCOUNTER — Ambulatory Visit (INDEPENDENT_AMBULATORY_CARE_PROVIDER_SITE_OTHER): Payer: Medicare Other | Admitting: Family Medicine

## 2012-06-18 VITALS — BP 140/80 | Temp 98.1°F | Wt 191.0 lb

## 2012-06-18 DIAGNOSIS — Z23 Encounter for immunization: Secondary | ICD-10-CM

## 2012-06-18 DIAGNOSIS — IMO0001 Reserved for inherently not codable concepts without codable children: Secondary | ICD-10-CM

## 2012-06-18 DIAGNOSIS — M791 Myalgia, unspecified site: Secondary | ICD-10-CM

## 2012-06-18 LAB — GLUCOSE, POCT (MANUAL RESULT ENTRY): POC Glucose: 93 mg/dl (ref 70–99)

## 2012-06-18 LAB — SEDIMENTATION RATE: Sed Rate: 16 mm/hr (ref 0–22)

## 2012-06-18 NOTE — Progress Notes (Signed)
  Subjective:    Patient ID: Sara Logan, female    DOB: February 24, 1933, 79 y.o.   MRN: [id number]  HPI  Patient with suspected polymyalgia rheumatica. Sedimentation rate only modestly elevated but patient had fairly acute onset of severe muscle aches mostly trunk and upper back and neck region. She had very good response with low-dose prednisone. Still has occasional mild pains midday but overall this is greatly improved. She's on prednisone 10 mg daily. No visual changes. No headaches. Our plan was for blood sugar today and repeat sedimentation rate. No medication side effects   Review of Systems  Constitutional: Negative for fatigue.  Eyes: Negative for visual disturbance.  Respiratory: Negative for cough, chest tightness, shortness of breath and wheezing.   Cardiovascular: Negative for chest pain, palpitations and leg swelling.  Neurological: Negative for dizziness, seizures, syncope, weakness, light-headedness and headaches.       Objective:   Physical Exam  Constitutional: She appears well-developed and well-nourished.  Cardiovascular: Normal rate and regular rhythm.   Pulmonary/Chest: Effort normal and breath sounds normal. No respiratory distress. She has no wheezes. She has no rales.  Musculoskeletal: She exhibits no edema.          Assessment & Plan:  Myalgias. Suspected polymyalgia rheumatica. Check blood sugar today but she's not fasting. She is about 2 hours postprandial. Repeat sedimentation rate. Continue prednisone 10 mg daily. DEXA scan later this year.

## 2012-06-19 NOTE — Progress Notes (Signed)
Quick Note:  Pt informed, sent labs to home at her request ______

## 2012-06-20 ENCOUNTER — Other Ambulatory Visit: Payer: Self-pay | Admitting: Family Medicine

## 2012-07-11 DIAGNOSIS — H268 Other specified cataract: Secondary | ICD-10-CM | POA: Diagnosis not present

## 2012-07-11 DIAGNOSIS — H2510 Age-related nuclear cataract, unspecified eye: Secondary | ICD-10-CM | POA: Diagnosis not present

## 2012-07-17 DIAGNOSIS — H2510 Age-related nuclear cataract, unspecified eye: Secondary | ICD-10-CM | POA: Diagnosis not present

## 2012-07-17 DIAGNOSIS — Z9889 Other specified postprocedural states: Secondary | ICD-10-CM | POA: Diagnosis not present

## 2012-07-17 DIAGNOSIS — F3289 Other specified depressive episodes: Secondary | ICD-10-CM | POA: Diagnosis not present

## 2012-07-17 DIAGNOSIS — H268 Other specified cataract: Secondary | ICD-10-CM | POA: Diagnosis not present

## 2012-07-17 DIAGNOSIS — F329 Major depressive disorder, single episode, unspecified: Secondary | ICD-10-CM | POA: Diagnosis not present

## 2012-07-17 DIAGNOSIS — Z9849 Cataract extraction status, unspecified eye: Secondary | ICD-10-CM | POA: Diagnosis not present

## 2012-07-17 DIAGNOSIS — Z79899 Other long term (current) drug therapy: Secondary | ICD-10-CM | POA: Diagnosis not present

## 2012-07-17 DIAGNOSIS — Z9089 Acquired absence of other organs: Secondary | ICD-10-CM | POA: Diagnosis not present

## 2012-07-17 DIAGNOSIS — M353 Polymyalgia rheumatica: Secondary | ICD-10-CM | POA: Diagnosis not present

## 2012-07-17 DIAGNOSIS — Z8249 Family history of ischemic heart disease and other diseases of the circulatory system: Secondary | ICD-10-CM | POA: Diagnosis not present

## 2012-07-18 DIAGNOSIS — Z961 Presence of intraocular lens: Secondary | ICD-10-CM | POA: Insufficient documentation

## 2012-07-23 ENCOUNTER — Ambulatory Visit: Payer: Medicare Other | Admitting: Family Medicine

## 2012-07-28 ENCOUNTER — Ambulatory Visit (INDEPENDENT_AMBULATORY_CARE_PROVIDER_SITE_OTHER): Payer: Medicare Other | Admitting: Family Medicine

## 2012-07-28 ENCOUNTER — Encounter: Payer: Self-pay | Admitting: Family Medicine

## 2012-07-28 VITALS — BP 160/78 | Temp 99.0°F | Wt 194.0 lb

## 2012-07-28 DIAGNOSIS — M791 Myalgia, unspecified site: Secondary | ICD-10-CM

## 2012-07-28 DIAGNOSIS — IMO0001 Reserved for inherently not codable concepts without codable children: Secondary | ICD-10-CM

## 2012-07-28 MED ORDER — PREDNISONE 5 MG PO TABS: ORAL_TABLET | ORAL | Status: DC

## 2012-07-28 NOTE — Progress Notes (Signed)
  Subjective:    Patient ID: Sara Logan, female    DOB: 11-17-1932, 78 y.o.   MRN: [id number]  HPI  Recent myalgias and fatigue. Refer to prior notes. We suspected clinically possible polymyalgia rheumatica. Her sedimentation rate though was modestly elevated at 24. We placed her on very low-dose prednisone and she did have fairly dramatic response. She is concerned now about possible fluid retention /weight gain though weight does not reflect any increase of the past couple months. Her other medical problems include history of rosacea and mild hyperlipidemia and remote history of depression. Her myalgias are relatively stable. She still has some intermittent upper back pain at times. She noted subjectively possible mild fever but no chills just today. No respiratory symptoms. No abdominal pain. No dysuria.   Review of Systems  Constitutional: Positive for fatigue. Negative for chills.  Respiratory: Negative for cough and shortness of breath.   Cardiovascular: Negative for chest pain.  Genitourinary: Negative for dysuria.       Objective:   Physical Exam  Constitutional: She appears well-developed and well-nourished.  Neck: Neck supple.  Cardiovascular: Normal rate and regular rhythm.   Pulmonary/Chest: Effort normal and breath sounds normal. No respiratory distress. She has no wheezes. She has no rales.  Musculoskeletal: She exhibits no edema and no tenderness.          Assessment & Plan:  Myalgias. Clinically, we suspected polymyalgia rheumatica but this has not been totally clear. First, she'll he had modestly elevated sed rates and she did not have total resolution of symptoms with low-dose prednisone-though these did mostly resolve with prednisone. We have recommended tapering prednisone to 7.5 mg daily for one month and we'll consider in one month further tapering from there-if symptoms stable and ESR stable.

## 2012-08-27 ENCOUNTER — Other Ambulatory Visit: Payer: Self-pay | Admitting: Family Medicine

## 2012-08-27 ENCOUNTER — Ambulatory Visit (INDEPENDENT_AMBULATORY_CARE_PROVIDER_SITE_OTHER): Payer: Medicare Other | Admitting: Family Medicine

## 2012-08-27 ENCOUNTER — Encounter: Payer: Self-pay | Admitting: Family Medicine

## 2012-08-27 VITALS — BP 120/60 | Temp 98.3°F | Wt 200.0 lb

## 2012-08-27 DIAGNOSIS — IMO0001 Reserved for inherently not codable concepts without codable children: Secondary | ICD-10-CM | POA: Diagnosis not present

## 2012-08-27 DIAGNOSIS — M899 Disorder of bone, unspecified: Secondary | ICD-10-CM | POA: Diagnosis not present

## 2012-08-27 DIAGNOSIS — Z1231 Encounter for screening mammogram for malignant neoplasm of breast: Secondary | ICD-10-CM

## 2012-08-27 DIAGNOSIS — M791 Myalgia, unspecified site: Secondary | ICD-10-CM

## 2012-08-27 DIAGNOSIS — M8580 Other specified disorders of bone density and structure, unspecified site: Secondary | ICD-10-CM | POA: Insufficient documentation

## 2012-08-27 LAB — SEDIMENTATION RATE: Sed Rate: 10 mm/hr (ref 0–22)

## 2012-08-27 NOTE — Progress Notes (Signed)
  Subjective:    Patient ID: Sara Logan, female    DOB: Jan 22, 1933, 78 y.o.   MRN: [id number]  HPI  Question of polymyalgia rheumatica. Refer to prior notes. She presented with diffuse myalgias especially involving trunk and upper back. Modestly elevated sedimentation rate. We tapered her prednisone back to 7.5 mg last visit she has not had any breakthrough symptoms. She would like to consider further tapering.  History of osteopenia. Last DEXA scan 2010. Would like to schedule followup. Takes calcium and vitamin D. Took Fosamax only briefly per previous physician. No recent falls  Past Medical History  Diagnosis Date  . Arthritis   . Depression   . Hyperlipidemia   . Osteopenia   . Thrombocytopenia   . Vitamin D deficiency   . Wrist fracture 2002    left   Past Surgical History  Procedure Date  . Joint replacement 2002    right total   . Carpal tunnel release 2008    right  . Appendectomy 1967    reports that she has never smoked. She does not have any smokeless tobacco history on file. She reports that she does not drink alcohol or use illicit drugs. family history includes Cancer in her mother and sister. No Known Allergies    Review of Systems  Constitutional: Negative for fever, chills and appetite change.  Eyes: Negative for visual disturbance.  Musculoskeletal: Negative for myalgias and back pain.  Neurological: Negative for dizziness and headaches.       Objective:   Physical Exam  Constitutional: She appears well-developed and well-nourished.  Cardiovascular: Normal rate and regular rhythm.   Pulmonary/Chest: Effort normal and breath sounds normal. No respiratory distress. She has no wheezes. She has no rales.  Musculoskeletal: She exhibits no edema.          Assessment & Plan:  #1 myalgias. Question of PMR. Repeat sedimentation rate. Continue slow taper of prednisone if no symptom breakthrough. #2 osteopenia. Schedule repeat DEXA scan. Continue  calcium and vitamin D.

## 2012-08-27 NOTE — Patient Instructions (Signed)
Decrease prednisone to 5 mg daily for 3 weeks then alternate 5 and 2.5 mg for 3 weeks, then 2.5 mg daily for 3 weeks then discontinue. Follow up for any breakthrough symtpoms

## 2012-08-29 NOTE — Progress Notes (Signed)
Quick Note:  Pt informed on home personally identified VM ______ 

## 2012-09-15 ENCOUNTER — Ambulatory Visit (HOSPITAL_COMMUNITY): Payer: Medicare Other

## 2012-09-23 ENCOUNTER — Ambulatory Visit (INDEPENDENT_AMBULATORY_CARE_PROVIDER_SITE_OTHER)
Admission: RE | Admit: 2012-09-23 | Discharge: 2012-09-23 | Disposition: A | Discharge status: Home / Self Care | Payer: Medicare Other | Source: Ambulatory Visit | Attending: Family Medicine | Admitting: Family Medicine

## 2012-09-23 DIAGNOSIS — M8580 Other specified disorders of bone density and structure, unspecified site: Secondary | ICD-10-CM

## 2012-09-23 DIAGNOSIS — M899 Disorder of bone, unspecified: Secondary | ICD-10-CM | POA: Diagnosis not present

## 2012-09-23 DIAGNOSIS — M949 Disorder of cartilage, unspecified: Secondary | ICD-10-CM

## 2012-09-26 ENCOUNTER — Ambulatory Visit (HOSPITAL_COMMUNITY)
Admission: RE | Admit: 2012-09-26 | Discharge: 2012-09-26 | Disposition: A | Discharge status: Home / Self Care | Payer: Medicare Other | Source: Ambulatory Visit | Attending: Family Medicine | Admitting: Family Medicine

## 2012-09-26 DIAGNOSIS — Z1231 Encounter for screening mammogram for malignant neoplasm of breast: Secondary | ICD-10-CM | POA: Diagnosis not present

## 2012-09-29 NOTE — Progress Notes (Signed)
Quick Note:  Pt informed ______ 

## 2012-10-17 ENCOUNTER — Telehealth: Payer: Self-pay | Admitting: Family Medicine

## 2012-10-17 NOTE — Telephone Encounter (Signed)
Pt would like referral to cardiologist, but would first like to discuss w/ you, [name]. Coulds you pls give pt a call?  Pt will be home in the am, and cell phone in pm.   Home: [phone]     Cell [phone]

## 2012-10-17 NOTE — Telephone Encounter (Signed)
I did call pt back, she was encouraged to come for OV to discuss

## 2012-10-24 ENCOUNTER — Ambulatory Visit: Payer: Medicare Other | Admitting: Family Medicine

## 2012-10-29 ENCOUNTER — Encounter: Payer: Self-pay | Admitting: Family Medicine

## 2012-10-29 ENCOUNTER — Ambulatory Visit (INDEPENDENT_AMBULATORY_CARE_PROVIDER_SITE_OTHER): Payer: Medicare Other | Admitting: Family Medicine

## 2012-10-29 VITALS — BP 132/78 | Temp 99.3°F | Wt 198.0 lb

## 2012-10-29 DIAGNOSIS — IMO0001 Reserved for inherently not codable concepts without codable children: Secondary | ICD-10-CM | POA: Diagnosis not present

## 2012-10-29 DIAGNOSIS — F4321 Adjustment disorder with depressed mood: Secondary | ICD-10-CM | POA: Diagnosis not present

## 2012-10-29 DIAGNOSIS — E785 Hyperlipidemia, unspecified: Secondary | ICD-10-CM

## 2012-10-29 NOTE — Progress Notes (Signed)
  Subjective:    Patient ID: Sara Logan, female    DOB: November 06, 1932, 79 y.o.   MRN: [id number]  HPI  Here for follow up several items:  ?PMR.  Now off prednisone.  No recurrent myalgias.  Her initial dx remains in doubt.  Son died 10-11-12 suddenly-?cardiac arrest.  Erasmo Score later this week. She is getting bereavement  Counseling through Hospice and doing well.   Pt has concerns regarding her cardiac risk. Nonsmoker. No DM.  No htn. Mother lived to 28.  GM had CAD 70s Mild hyperlipidemia.  Requesting repeat lipids.  Past Medical History  Diagnosis Date  . Arthritis   . Depression   . Hyperlipidemia   . Osteopenia   . Thrombocytopenia   . Vitamin D deficiency   . Wrist fracture 2002    left   Past Surgical History  Procedure Laterality Date  . Joint replacement  2002    right total   . Carpal tunnel release  2008    right  . Appendectomy  1967    reports that she has never smoked. She does not have any smokeless tobacco history on file. She reports that she does not drink alcohol or use illicit drugs. family history includes Cancer in her mother and sister. No Known Allergies     Review of Systems  Constitutional: Negative for fatigue.  Eyes: Negative for visual disturbance.  Respiratory: Negative for cough, chest tightness, shortness of breath and wheezing.   Cardiovascular: Negative for chest pain, palpitations and leg swelling.  Musculoskeletal: Negative for myalgias.  Neurological: Negative for dizziness, seizures, syncope, weakness, light-headedness and headaches.  Psychiatric/Behavioral: The patient is not nervous/anxious.        Objective:   Physical Exam  Constitutional: She is oriented to person, place, and time. She appears well-developed and well-nourished. No distress.  Neck: Neck supple. No thyromegaly present.  Cardiovascular: Normal rate and regular rhythm.   Pulmonary/Chest: Effort normal and breath sounds normal. No respiratory distress. She  has no wheezes. She has no rales.  Musculoskeletal: She exhibits no edema.  Neurological: She is alert and oriented to person, place, and time.  Psychiatric: She has a normal mood and affect. Thought content normal.          Assessment & Plan:  #1  Hx myalgias-? Hx PMR. Stable off prednisone. #2  Grief reaction.  Pt has good support. Cont with counseling #3 hx hyperlipidemia.  Repeat lipids.  She is fasting today.

## 2012-11-04 ENCOUNTER — Other Ambulatory Visit: Payer: Medicare Other

## 2012-11-05 DIAGNOSIS — L570 Actinic keratosis: Secondary | ICD-10-CM | POA: Diagnosis not present

## 2012-11-05 DIAGNOSIS — L719 Rosacea, unspecified: Secondary | ICD-10-CM | POA: Diagnosis not present

## 2012-11-07 ENCOUNTER — Other Ambulatory Visit: Payer: Medicare Other

## 2012-11-12 ENCOUNTER — Ambulatory Visit (INDEPENDENT_AMBULATORY_CARE_PROVIDER_SITE_OTHER): Payer: Medicare Other | Admitting: Family

## 2012-11-12 ENCOUNTER — Encounter: Payer: Self-pay | Admitting: Family

## 2012-11-12 ENCOUNTER — Telehealth: Payer: Self-pay | Admitting: Family Medicine

## 2012-11-12 ENCOUNTER — Other Ambulatory Visit: Payer: Medicare Other

## 2012-11-12 VITALS — BP 132/78 | HR 97 | Temp 98.9°F | Wt 198.0 lb

## 2012-11-12 DIAGNOSIS — J209 Acute bronchitis, unspecified: Secondary | ICD-10-CM

## 2012-11-12 DIAGNOSIS — J309 Allergic rhinitis, unspecified: Secondary | ICD-10-CM

## 2012-11-12 MED ORDER — METHYLPREDNISOLONE 4 MG PO KIT: PACK | ORAL | Status: AC

## 2012-11-12 NOTE — Patient Instructions (Addendum)

## 2012-11-12 NOTE — Progress Notes (Signed)
Subjective:    Patient ID: MASSA PE, female    DOB: 1933/03/03, 79 y.o.   MRN: [id number]  HPI 79 year old white female, nonsmoker, patient of Dr. Caryl Never is in today with complaints of cough, sinus congestion, ear pain that's been ongoing x4 days. She's been taken over-the-counter Robitussin-DM and Tylenol that is helped her symptoms some. Reports occasional wheezing in her chest. No fever.   Review of Systems  Constitutional: Negative.   HENT: Positive for congestion, postnasal drip and sinus pressure.   Respiratory: Positive for cough.   Cardiovascular: Negative.   Skin: Negative.   Allergic/Immunologic: Negative.   Neurological: Negative.   Hematological: Negative.    Past Medical History  Diagnosis Date  . Arthritis   . Depression   . Hyperlipidemia   . Osteopenia   . Thrombocytopenia   . Vitamin D deficiency   . Wrist fracture 2002    left    History   Social History  . Marital Status: Married    Spouse Name: N/A    Number of Children: N/A  . Years of Education: N/A   Occupational History  . Not on file.   Social History Main Topics  . Smoking status: Never Smoker   . Smokeless tobacco: Not on file  . Alcohol Use: No  . Drug Use: No  . Sexually Active:    Other Topics Concern  . Not on file   Social History Narrative  . No narrative on file    Past Surgical History  Procedure Laterality Date  . Joint replacement  2002    right total   . Carpal tunnel release  2008    right  . Appendectomy  1967    Family History  Problem Relation Age of Onset  . Cancer Mother     breast  . Cancer Sister     breast    No Known Allergies  Current Outpatient Prescriptions on File Prior to Visit  Medication Sig Dispense Refill  . Cholecalciferol (D3 DOTS) 2000 UNITS TBDP Take by mouth daily.        Marland Kitchen ketorolac (ACULAR) 0.4 % SOLN 1 drop daily. Left eye      . predniSONE (DELTASONE) 5 MG tablet Take one and one half tablet (7.5 mg ) daily  45  tablet  6  . venlafaxine XR (EFFEXOR XR) 75 MG 24 hr capsule Take 2 capsules (150 mg total) by mouth daily.  180 capsule  2   No current facility-administered medications on file prior to visit.    BP 132/78  Pulse 97  Temp(Src) 98.9 F (37.2 C) (Oral)  Wt 198 lb (89.812 kg)  BMI 36.21 kg/m2  SpO2 98%chart     Objective:   Physical Exam  Constitutional: She is oriented to person, place, and time. She appears well-developed and well-nourished.  HENT:  Right Ear: External ear normal.  Left Ear: External ear normal.  Nose: Nose normal.  Mouth/Throat: Oropharynx is clear and moist.  Neck: Normal range of motion. Neck supple.  Cardiovascular: Normal rate, regular rhythm and normal heart sounds.   Pulmonary/Chest: Effort normal. She has wheezes.  Very mild wheezing bilaterally.   Neurological: She is alert and oriented to person, place, and time.  Skin: Skin is warm and dry.  Psychiatric: She has a normal mood and affect.         Assessment & Plan:  Assessment: 1. Rhinitis 2. Bronchitis- mild  Plan: Medrol Dosepak as directed. Drink plenty of fluids. Robitussin  over-the-counter as needed for cough. Call the office if symptoms worsen or persist. Recheck a schedule, and as needed.of an and in a

## 2012-11-12 NOTE — Telephone Encounter (Signed)
°  Patient Information:  Caller Name: Nickia  Phone: [phone]  Patient: Sara Logan  Gender: Female  DOB: 1933/03/29  Age: 79 Years  PCP: Evelena Peat [facility])  Office Follow Up:  Does the office need to follow up with this patient?: No  Instructions For The Office: N/A  RN Note:  Requested appointment for 11/12/12 for cough, suspected infleunza symptoms for past 5 days.  Exposed to many others recently due to son died unexpectantly 09-27-12 and husband in nursing facility.  Reports episodes of severe coughing with some breathing restriction that is not severe.   Symptoms  Reason For Call & Symptoms: Called for appointment for "influenza" symptoms.  Reported sudden onset aching, cough, sinus pressure.  Reviewed Health History In EMR: Yes  Reviewed Medications In EMR: Yes  Reviewed Allergies In EMR: Yes  Reviewed Surgeries / Procedures: Yes  Date of Onset of Symptoms: 11/08/2012  Treatments Tried: Robitussin DM  Treatments Tried Worked: Yes  Guideline(s) Used:  Influenza - Seasonal  Disposition Per Guideline:   Go to ED Now (or to Office with PCP Approval)  Reason For Disposition Reached:   Difficulty breathing that is not severe and not relieved by cleaning out the nose  Advice Given:  N/A  RN Overrode Recommendation:  Make Appointment  Office is open; prefers to be seen in office.  Appointment Scheduled:  11/12/2012 15:45:00 Appointment Scheduled Provider:  Adline Mango [facility])

## 2012-11-17 ENCOUNTER — Other Ambulatory Visit (INDEPENDENT_AMBULATORY_CARE_PROVIDER_SITE_OTHER): Payer: Medicare Other

## 2012-11-17 DIAGNOSIS — E785 Hyperlipidemia, unspecified: Secondary | ICD-10-CM | POA: Diagnosis not present

## 2012-11-17 LAB — LIPID PANEL
Cholesterol: 216 mg/dL — ABNORMAL HIGH (ref 0–200)
HDL: 46.9 mg/dL (ref >39.00)
Total CHOL/HDL Ratio: 5
Triglycerides: 157 mg/dL — ABNORMAL HIGH (ref 0.0–149.0)
VLDL: 31.4 mg/dL (ref 0.0–40.0)

## 2012-11-17 LAB — LDL CHOLESTEROL, DIRECT: Direct LDL: 147 mg/dL

## 2012-12-12 ENCOUNTER — Ambulatory Visit (INDEPENDENT_AMBULATORY_CARE_PROVIDER_SITE_OTHER): Payer: Medicare Other | Admitting: Family Medicine

## 2012-12-12 ENCOUNTER — Encounter: Payer: Self-pay | Admitting: Family Medicine

## 2012-12-12 VITALS — BP 140/78 | Temp 98.3°F | Wt 198.0 lb

## 2012-12-12 DIAGNOSIS — R5383 Other fatigue: Secondary | ICD-10-CM

## 2012-12-12 DIAGNOSIS — R5381 Other malaise: Secondary | ICD-10-CM

## 2012-12-12 NOTE — Patient Instructions (Addendum)
Try to get more sleep. Start your exercise program and let me know if no improvement with fatigue issues in a few weeks. Try to avoid high glucose/glycemic.

## 2012-12-12 NOTE — Progress Notes (Signed)
  Subjective:    Patient ID: Sara Logan, female    DOB: 05-01-1933, 79 y.o.   MRN: [id number]  HPI Patient seen with increased fatigue   she's been battling fatigue off and on for several weeks, if not months.   she feels some of this is related to stress of her husband with terminal Parkinson's disease. He is currently in a residential facility. Patient's not getting consisting good sleep. Some of this related to going to bed late at night. Currently not exercising. Plans to start next week.  She has history of depression which has been stable with Effexor. Recent thyroid functions normal. Recent electrolytes normal. Denies any chest pains. No exertional dyspnea. She does have occasional urine urgency and nocturia- generally only once or twice at night  Past Medical History  Diagnosis Date  . Arthritis   . Depression   . Hyperlipidemia   . Osteopenia   . Thrombocytopenia   . Vitamin D deficiency   . Wrist fracture 2002    left   Past Surgical History  Procedure Laterality Date  . Joint replacement  2002    right total   . Carpal tunnel release  2008    right  . Appendectomy  1967    reports that she has never smoked. She does not have any smokeless tobacco history on file. She reports that she does not drink alcohol or use illicit drugs. family history includes Cancer in her mother and sister. No Known Allergies    Review of Systems  Constitutional: Negative for fever, chills and unexpected weight change.  Respiratory: Negative for cough and shortness of breath.   Cardiovascular: Negative for chest pain.  Gastrointestinal: Negative for abdominal pain.  Genitourinary: Negative for dysuria.  Neurological: Negative for dizziness and headaches.  Hematological: Negative for adenopathy.       Objective:   Physical Exam  Constitutional: She is oriented to person, place, and time. She appears well-developed and well-nourished.  Cardiovascular: Normal rate and regular  rhythm.   Pulmonary/Chest: Effort normal.  Patient some faint crackles left base which we feel are chronic.  Musculoskeletal: She exhibits no edema.  Neurological: She is alert and oriented to person, place, and time. No cranial nerve deficit.  Psychiatric: She has a normal mood and affect. Her behavior is normal.          Assessment & Plan:   fatigue. Suspect multifactorial. Probable inadequate sleep. Adjustment disorder dealing with husband's chronic illness. No consistent exercise. Recent labs unremarkable. First focus on increasing sleep and starting some regular exercise program. Avoid high glycemic foods. Touch base 3 weeks if not improving with exercise

## 2012-12-22 ENCOUNTER — Ambulatory Visit (INDEPENDENT_AMBULATORY_CARE_PROVIDER_SITE_OTHER): Payer: Medicare Other | Admitting: Family Medicine

## 2012-12-22 ENCOUNTER — Encounter: Payer: Self-pay | Admitting: Family Medicine

## 2012-12-22 VITALS — BP 120/60 | Temp 98.6°F | Wt 197.0 lb

## 2012-12-22 DIAGNOSIS — R42 Dizziness and giddiness: Secondary | ICD-10-CM | POA: Diagnosis not present

## 2012-12-22 DIAGNOSIS — S81809A Unspecified open wound, unspecified lower leg, initial encounter: Secondary | ICD-10-CM

## 2012-12-22 DIAGNOSIS — S81812A Laceration without foreign body, left lower leg, initial encounter: Secondary | ICD-10-CM

## 2012-12-22 NOTE — Progress Notes (Signed)
  Subjective:    Patient ID: Sara Logan, female    DOB: [birth year], 79 y.o.   MRN: [id number]  HPI  Patient seen with small superficial laceration left leg. This morning accidentally dropped a pair scissors that glanced across her left leg She had tetanus back in 2007 She had significant bleeding initially which was controlled with pressure.  She has felt somewhat lightheaded today. No syncope. No nausea or vomiting. No recent diarrhea. Symptoms are worse with standing. She does not take any antihypertensive medications Denies any vertigo.  No chest pain or dyspnea.  Past Medical History  Diagnosis Date  . Arthritis   . Depression   . Hyperlipidemia   . Osteopenia   . Thrombocytopenia   . Vitamin D deficiency   . Wrist fracture 2002    left   Past Surgical History  Procedure Laterality Date  . Joint replacement  2002    right total   . Carpal tunnel release  2008    right  . Appendectomy  1967    reports that she has never smoked. She does not have any smokeless tobacco history on file. She reports that she does not drink alcohol or use illicit drugs. family history includes Cancer in her mother and sister. No Known Allergies   Review of Systems  Constitutional: Negative for appetite change and unexpected weight change.  Eyes: Negative for visual disturbance.  Respiratory: Negative for cough and shortness of breath.   Cardiovascular: Negative for chest pain and palpitations.  Gastrointestinal: Negative for nausea, vomiting and diarrhea.  Genitourinary: Negative for dysuria.  Neurological: Positive for dizziness and weakness. Negative for syncope and headaches.  Psychiatric/Behavioral: Negative for confusion.       Objective:   Physical Exam  Constitutional: She appears well-developed and well-nourished. No distress.  Cardiovascular: Normal rate and regular rhythm.   Pulmonary/Chest: Effort normal and breath sounds normal. No respiratory distress. She has no  wheezes. She has no rales.  Musculoskeletal: She exhibits no edema.  Skin:  Left medial lower leg reveals 1 cm very superficial laceration. No gaping. No bleeding at this time          Assessment & Plan:  #1 superficial laceration left leg.  cleaned and antibiotic topically applied along with Steri-Strips. #2 lightheadedness. Slightly low blood pressure with standing. Does not take any antihypertensives. No clinical suspicion of anemia. Increase fluid intake. She will liberalize her sodium intake slightly as follows very low sodium diet currently.  Pt given oral fluids in office and able to ambulate without difficulty.

## 2012-12-22 NOTE — Patient Instructions (Addendum)
Keep clean with soap and water. Followup promptly for any signs of secondary infection such as increased redness, drainage, or fever Drink plenty of fluids.  Liberalize sodium intake slightly over the next few days

## 2013-01-01 ENCOUNTER — Other Ambulatory Visit: Payer: Self-pay | Admitting: Family Medicine

## 2013-02-16 ENCOUNTER — Encounter: Payer: Self-pay | Admitting: Family Medicine

## 2013-02-16 ENCOUNTER — Ambulatory Visit (INDEPENDENT_AMBULATORY_CARE_PROVIDER_SITE_OTHER): Payer: Medicare Other | Admitting: Family Medicine

## 2013-02-16 VITALS — BP 102/60 | Temp 98.6°F | Wt 199.0 lb

## 2013-02-16 DIAGNOSIS — I8390 Asymptomatic varicose veins of unspecified lower extremity: Secondary | ICD-10-CM | POA: Diagnosis not present

## 2013-02-16 DIAGNOSIS — R42 Dizziness and giddiness: Secondary | ICD-10-CM

## 2013-02-16 DIAGNOSIS — N3941 Urge incontinence: Secondary | ICD-10-CM

## 2013-02-16 MED ORDER — SOLIFENACIN SUCCINATE 5 MG PO TABS: 5.0000 mg | ORAL_TABLET | Freq: Every day | ORAL | Status: DC

## 2013-02-16 NOTE — Progress Notes (Signed)
  Subjective:    Patient ID: Sara Logan, female    DOB: 03-Mar-1933, 79 y.o.   MRN: [id number]  HPI Here with several issues as follows She feels fairly "lightheaded" for the past several days. Normal fluid intake. No vomiting or diarrhea. She has not taken any blood pressure medications. She had normal appetite and no dietary changes. Symptoms occur with standing. No syncope.  Patient has some swelling involving the right calf. Long history of varicose veins. Her swelling and tenderness have been very localized. She does not have any ankle or foot edema. No history of DVT.  She's had some chronic issues with slight dyspnea sometimes at rest ,sometimes with activity. No increased cough. No wheezing. No chest pains. No exertional discomfort. Chest x-ray last September did not reveal any acute or significant chronic changes  She has significant fatigue. She thinks a lot of this is related to poor sleep quality. She frequently has to get up with urine urgency 2-3 times per night. Very disruptive to her sleep. No recent burning with urination. Tries to limit a caffeine use.  Past Medical History  Diagnosis Date  . Arthritis   . Depression   . Hyperlipidemia   . Osteopenia   . Thrombocytopenia   . Vitamin D deficiency   . Wrist fracture 2002    left   Past Surgical History  Procedure Laterality Date  . Joint replacement  2002    right total   . Carpal tunnel release  2008    right  . Appendectomy  1967    reports that she has never smoked. She does not have any smokeless tobacco history on file. She reports that she does not drink alcohol or use illicit drugs. family history includes Cancer in her mother and sister. No Known Allergies    Review of Systems  Constitutional: Negative for chills and unexpected weight change.  Respiratory: Positive for shortness of breath. Negative for cough and wheezing.   Cardiovascular: Negative for chest pain, palpitations and leg swelling.   Gastrointestinal: Negative for nausea, vomiting, abdominal pain and diarrhea.  Genitourinary: Positive for urgency. Negative for hematuria.  Neurological: Positive for dizziness and light-headedness. Negative for syncope.       Objective:   Physical Exam  Constitutional: She appears well-developed and well-nourished.  HENT:  Mouth/Throat: Oropharynx is clear and moist.  Cardiovascular: Normal rate and regular rhythm.   Pulmonary/Chest: Effort normal and breath sounds normal. No respiratory distress. She has no wheezes. She has no rales.  Musculoskeletal:  Patient's varicosities lower legs bilaterally. No ankle or lower leg edema. She does couple fairly firm to palpation veins right calf but no color changes. No warmth.          Assessment & Plan:  #1 urinary urgency. Trial of VESIcare 5 mg each bedtime. Reassess one month #2 varicose veins lower extremities. Would recommend compression garments 20-30 mm pressure. Prescription written. #3 mild lightheadedness. Today's blood pressure 102/60 standing. Increase hydration. Liberalize sodium intake slightly

## 2013-02-16 NOTE — Patient Instructions (Addendum)
Urinary Incontinence Your doctor wants you to have this information about urinary incontinence. This is the inability to keep urine in your body until you decide to release it. CAUSES  Prostate gland enlargement is a common cause of urinary incontinence. But there are many different causes for losing urinary control. They include:  Medicines.  Infections.  Prostate problems.  Surgery.  Neurological diseases.  Emotional factors. DIAGNOSIS  Evaluating the cause of incontinence is important in choosing the best treatment. This may require:  An ultrasound exam.  Kidney and bladder X-rays.  Cystoscopy. This is an exam of the bladder using a narrow scope. TREATMENT  For incontinent patients, normal daily hygiene and using changing pads or adult diapers regularly will prevent offensive odors and skin damage from the moisture. Changing your medicines may help control incontinence. Your caregiver may prescribe some medicines to help you regain control. Avoid caffeine. It can over-stimulate the bladder. Use the bathroom regularly. Try about every 2 to 3 hours even if you do not feel the need. Take time to empty your bladder completely. After urinating, wait a minute. Then try to urinate again. External devices used to catch urine or an indwelling urine catheter (Foley catheter) may be needed as well. Some prostate gland problems require surgery to correct. Call your caregiver for more information. Document Released: 10/04/2004 Document Revised: 11/19/2011 Document Reviewed: 09/29/2008 [employer] Patient Information 2014 [employer].  

## 2013-02-20 DIAGNOSIS — H2640 Unspecified secondary cataract: Secondary | ICD-10-CM | POA: Diagnosis not present

## 2013-02-20 DIAGNOSIS — H02839 Dermatochalasis of unspecified eye, unspecified eyelid: Secondary | ICD-10-CM | POA: Diagnosis not present

## 2013-02-20 DIAGNOSIS — Z961 Presence of intraocular lens: Secondary | ICD-10-CM | POA: Diagnosis not present

## 2013-02-20 DIAGNOSIS — H26499 Other secondary cataract, unspecified eye: Secondary | ICD-10-CM | POA: Insufficient documentation

## 2013-03-05 ENCOUNTER — Ambulatory Visit (INDEPENDENT_AMBULATORY_CARE_PROVIDER_SITE_OTHER): Payer: Medicare Other | Admitting: Family Medicine

## 2013-03-05 ENCOUNTER — Encounter: Payer: Self-pay | Admitting: Family Medicine

## 2013-03-05 VITALS — BP 132/76 | HR 100 | Temp 98.3°F | Resp 20 | Wt 198.0 lb

## 2013-03-05 DIAGNOSIS — R42 Dizziness and giddiness: Secondary | ICD-10-CM | POA: Diagnosis not present

## 2013-03-05 DIAGNOSIS — R5383 Other fatigue: Secondary | ICD-10-CM

## 2013-03-05 DIAGNOSIS — R5381 Other malaise: Secondary | ICD-10-CM

## 2013-03-05 LAB — CBC WITH DIFFERENTIAL/PLATELET
Basophils Absolute: 0 10*3/uL (ref 0.0–0.1)
Basophils Relative: 0.4 % (ref 0.0–3.0)
Eosinophils Absolute: 0.3 10*3/uL (ref 0.0–0.7)
Eosinophils Relative: 4.9 % (ref 0.0–5.0)
HCT: 36 % (ref 36.0–46.0)
Hemoglobin: 11.8 g/dL — ABNORMAL LOW (ref 12.0–15.0)
Lymphocytes Relative: 20.9 % (ref 12.0–46.0)
Lymphs Abs: 1.4 10*3/uL (ref 0.7–4.0)
MCHC: 32.6 g/dL (ref 30.0–36.0)
MCV: 83.8 fl (ref 78.0–100.0)
Monocytes Absolute: 0.7 10*3/uL (ref 0.1–1.0)
Monocytes Relative: 10.5 % (ref 3.0–12.0)
Neutro Abs: 4.3 10*3/uL (ref 1.4–7.7)
Neutrophils Relative %: 63.3 % (ref 43.0–77.0)
Platelets: 158 10*3/uL (ref 150.0–400.0)
RBC: 4.3 Mil/uL (ref 3.87–5.11)
RDW: 13.8 % (ref 11.5–14.6)
WBC: 6.8 10*3/uL (ref 4.5–10.5)

## 2013-03-05 LAB — T3, FREE: T3, Free: 2.8 pg/mL (ref 2.3–4.2)

## 2013-03-05 LAB — T4, FREE: Free T4: 0.98 ng/dL (ref 0.60–1.60)

## 2013-03-05 LAB — TSH: TSH: 1.42 u[IU]/mL (ref 0.35–5.50)

## 2013-03-05 NOTE — Progress Notes (Signed)
Subjective:    Patient ID: Sara Logan, female    DOB: Jun 20, 1933, 79 y.o.   MRN: [id number]  HPI Patient seen with ongoing issues of fatigue and vague lightheadedness. Not positional. She is concerned after doing some reading on her own that she may have thyroid disorder- though she had normal TSH back in September. She is specifically requesting T3 and T4 though we explained that TSH is generally a valid screen for thyroid disease.  She has some urinary urgency and we recommend a trial of VESIcare as she was getting up about 3 times at night. We discussed that sleep disruption could be contributing to her fatigue. She only took this for one night. She did not have any side effects but felt in her own mind this (urine urgency) was not contributing to the problem.  She has concerns about celiac disease even though she has not had any recent anemia,diarrhea,skin rash or other specific finding. She does feel that sometimes she feels more poorly when eating gluten rich foods. She's also noted that she feels excessively fatigued and sometimes lightheaded after eating processed sugars or starches. She has no history of diabetes. No recent polydipsia or polyuria.  She is dealing with tremendous stress of her husband who has end-stage Parkinson's disease. She deals with lots of stress /anxiety but does not feel her depression has worsened. She remains on Effexor XR.  Past Medical History  Diagnosis Date  . Arthritis   . Depression   . Hyperlipidemia   . Osteopenia   . Thrombocytopenia   . Vitamin D deficiency   . Wrist fracture 2002    left   Past Surgical History  Procedure Laterality Date  . Joint replacement  2002    right total   . Carpal tunnel release  2008    right  . Appendectomy  1967    reports that she has never smoked. She does not have any smokeless tobacco history on file. She reports that she does not drink alcohol or use illicit drugs. family history includes Cancer in  her mother and sister. No Known Allergies    Review of Systems  Constitutional: Positive for fatigue. Negative for fever, chills, appetite change and unexpected weight change.  Respiratory: Negative for cough.   Cardiovascular: Negative for chest pain, palpitations and leg swelling.  Gastrointestinal: Negative for nausea, vomiting, abdominal pain and diarrhea.  Genitourinary: Negative for dysuria.  Neurological: Positive for dizziness and light-headedness. Negative for seizures, syncope, weakness and headaches.  Hematological: Negative for adenopathy. Does not bruise/bleed easily.  Psychiatric/Behavioral: Negative for confusion.       Objective:   Physical Exam  Constitutional: She is oriented to person, place, and time. She appears well-developed and well-nourished.  HENT:  Right Ear: External ear normal.  Left Ear: External ear normal.  Mouth/Throat: Oropharynx is clear and moist.  Neck: Neck supple. No thyromegaly present.  Cardiovascular: Normal rate.   Pulmonary/Chest: Effort normal and breath sounds normal. No respiratory distress. She has no wheezes. She has no rales.  Musculoskeletal: She exhibits no edema.  Lymphadenopathy:    She has no cervical adenopathy.  Neurological: She is alert and oriented to person, place, and time. No cranial nerve deficit.  Skin: No rash noted.  Psychiatric: She has a normal mood and affect. Her behavior is normal.          Assessment & Plan:  Patient has ongoing symptoms of fatigue and light-headedness.  I really think to a large extent  this is related to stress of dealing with her husband's chronic illness.  She is requesting repeat thyroid testing and celiac screening, though clinical suspicion is fairly low.labs ordered.

## 2013-03-06 ENCOUNTER — Telehealth: Payer: Self-pay | Admitting: Family Medicine

## 2013-03-06 LAB — TISSUE TRANSGLUTAMINASE, IGG: Tissue Transglut Ab: 5.2 U/mL (ref <20)

## 2013-03-06 NOTE — Telephone Encounter (Addendum)
Pt would like blood work results. Pt had labs drawn yesterday

## 2013-03-09 NOTE — Telephone Encounter (Signed)
Called and spoke with pt about lab results and pt is aware.

## 2013-03-09 NOTE — Progress Notes (Signed)
Quick Note:  Called and spoke with pt and pt is aware. ______ 

## 2013-03-13 DIAGNOSIS — F341 Dysthymic disorder: Secondary | ICD-10-CM | POA: Diagnosis not present

## 2013-03-19 ENCOUNTER — Encounter: Payer: Self-pay | Admitting: Family Medicine

## 2013-03-19 ENCOUNTER — Ambulatory Visit: Payer: Medicare Other | Admitting: Family Medicine

## 2013-03-19 ENCOUNTER — Ambulatory Visit (INDEPENDENT_AMBULATORY_CARE_PROVIDER_SITE_OTHER): Payer: Medicare Other | Admitting: Family Medicine

## 2013-03-19 VITALS — BP 130/66 | HR 93 | Temp 98.6°F | Ht 63.0 in | Wt 196.0 lb

## 2013-03-19 DIAGNOSIS — R5383 Other fatigue: Secondary | ICD-10-CM | POA: Diagnosis not present

## 2013-03-19 DIAGNOSIS — R5381 Other malaise: Secondary | ICD-10-CM | POA: Diagnosis not present

## 2013-03-19 DIAGNOSIS — R3915 Urgency of urination: Secondary | ICD-10-CM | POA: Diagnosis not present

## 2013-03-19 NOTE — Patient Instructions (Addendum)
Follow up immediately for any chest pain or increased shortness of breath.

## 2013-03-19 NOTE — Progress Notes (Signed)
  Subjective:    Patient ID: Sara Logan, female    DOB: Jan 06, 1933, 79 y.o.   MRN: [id number]  HPI Patient here for followup regarding fatigue issues. Accompanied by her daughter. She's had very stressful year with her husband and with terminal Parkinson's disease and skilled nursing facility in loss of her son this past year. She has some grief counseling and plans to continue Performed several recent labs which are again reviewed with patient and these were all basically normal. She has no evidence for thyroid disease.  She has urinary urgency and disrupted sleep patterns and just recently one week ago started regular use of VESIcare. Not much improvement so far.  Appetite and weight have been stable. She has occasional mild dizziness with standing but no consistent orthostatic symptoms. She has some dyspnea with activity occasionally but not consistently and no chest pains. Echocardiogram 2008 normal ejection fraction no major abnormalities.  Past Medical History  Diagnosis Date  . Arthritis   . Depression   . Hyperlipidemia   . Osteopenia   . Thrombocytopenia   . Vitamin D deficiency   . Wrist fracture 2002    left   Past Surgical History  Procedure Laterality Date  . Joint replacement  2002    right total   . Carpal tunnel release  2008    right  . Appendectomy  1967    reports that she has never smoked. She does not have any smokeless tobacco history on file. She reports that she does not drink alcohol or use illicit drugs. family history includes Cancer in her mother and sister. No Known Allergies    Review of Systems  Constitutional: Positive for fatigue. Negative for fever, chills, appetite change and unexpected weight change.  Respiratory: Negative for wheezing.   Cardiovascular: Negative for chest pain, palpitations and leg swelling.  Gastrointestinal: Negative for nausea, vomiting and abdominal pain.  Endocrine: Negative for polydipsia and polyuria.   Genitourinary: Negative for dysuria.  Neurological: Negative for dizziness and headaches.       Objective:   Physical Exam  Constitutional: She is oriented to person, place, and time. She appears well-developed and well-nourished.  Neck: Neck supple. No thyromegaly present.  Cardiovascular: Normal rate and regular rhythm.   Pulmonary/Chest: Effort normal and breath sounds normal. No respiratory distress. She has no wheezes. She has no rales.  Musculoskeletal: She exhibits no edema.  Neurological: She is alert and oriented to person, place, and time. No cranial nerve deficit.          Assessment & Plan:  #1 persistent fatigue. Recent labs unremarkable. Suspect related to disrupted sleep patterns and recent stress issues. Continue VESIcare and measures to reduce urinary urgency. She is strongly encouraged to continue counseling for her grief issues #2 urinary urgency. Continue VESIcare

## 2013-04-14 ENCOUNTER — Encounter: Payer: Self-pay | Admitting: Family Medicine

## 2013-04-14 ENCOUNTER — Ambulatory Visit (INDEPENDENT_AMBULATORY_CARE_PROVIDER_SITE_OTHER): Payer: Medicare Other | Admitting: Family Medicine

## 2013-04-14 VITALS — BP 108/70 | Temp 97.6°F | Wt 195.0 lb

## 2013-04-14 DIAGNOSIS — R300 Dysuria: Secondary | ICD-10-CM

## 2013-04-14 NOTE — Progress Notes (Signed)
Chief Complaint  Patient presents with  . Dysuria    HPI:  Acute visit for dysuria: -symptoms: dysuria, burning with urinary for a few days, urinary frequency and urgency -chronic urinary urgency seen recently by PCP for this and treating with vesicare - she wants to check for UTI today -denies: fevers, flank pain, abd or pelvic pain, hematuria, nausea, vomiting, vaginal discharge -reports hx of UTI ROS: See pertinent positives and negatives per HPI.  Past Medical History  Diagnosis Date  . Arthritis   . Depression   . Hyperlipidemia   . Osteopenia   . Thrombocytopenia   . Vitamin D deficiency   . Wrist fracture 2002    left    Family History  Problem Relation Age of Onset  . Cancer Mother     breast  . Cancer Sister     breast    History   Social History  . Marital Status: Married    Spouse Name: N/A    Number of Children: N/A  . Years of Education: N/A   Social History Main Topics  . Smoking status: Never Smoker   . Smokeless tobacco: None  . Alcohol Use: No  . Drug Use: No  . Sexually Active:    Other Topics Concern  . None   Social History Narrative  . None    Current outpatient prescriptions:Biotin 10 MG TABS, Take by mouth daily., Disp: , Rfl: ;  Cholecalciferol (D3 DOTS) 2000 UNITS TBDP, Take by mouth daily.  , Disp: , Rfl: ;  Probiotic Product (PRO-BIOTIC BLEND PO), Take by mouth daily., Disp: , Rfl: ;  solifenacin (VESICARE) 5 MG tablet, Take 1 tablet (5 mg total) by mouth daily., Disp: 30 tablet, Rfl: 6 venlafaxine XR (EFFEXOR-XR) 75 MG 24 hr capsule, TAKE 2 CAPSULES (150MG ) BY MOUTH DAILY., Disp: 120 capsule, Rfl: 5;  vitamin C (ASCORBIC ACID) 500 MG tablet, Take 500 mg by mouth daily., Disp: , Rfl: ;  vitamin E 100 UNIT capsule, Take 100 Units by mouth daily., Disp: , Rfl:   EXAM:  Filed Vitals:   04/14/13 1416  BP: 108/70  Temp: 97.6 F (36.4 C)    Body mass index is 34.55 kg/(m^2).  GENERAL: vitals reviewed and listed above, alert,  oriented, appears well hydrated and in no acute distress  HEENT: atraumatic, conjunttiva clear, no obvious abnormalities on inspection of external nose and ears  NECK: no obvious masses on inspection  LUNGS: clear to auscultation bilaterally, no wheezes, rales or rhonchi, good air movement  CV: HRRR, no peripheral edema  ABD: soft, NTTP, no CVA TTP  MS: moves all extremities without noticeable abnormality  PSYCH: pleasant and cooperative, no obvious depression or anxiety  ASSESSMENT AND PLAN:  Discussed the following assessment and plan:  Dysuria - Plan: POCT urinalysis dipstick, Culture, Urine  -pt did not want to wait to give sample to do cath urine and prefers to return in morning to give urine sample - she reports she feels ok and is comfortable doing this despite potential for infection -she will see my nurse in am for urine test and then we will follow up with her -Patient advised to return or notify a doctor immediately if symptoms worsen or persist or new concerns arise.  There are no Patient Instructions on file for this visit.   Kriste Basque R.

## 2013-04-15 ENCOUNTER — Ambulatory Visit: Payer: Medicare Other

## 2013-04-15 DIAGNOSIS — R300 Dysuria: Secondary | ICD-10-CM | POA: Diagnosis not present

## 2013-04-15 LAB — POCT URINALYSIS DIPSTICK
Bilirubin, UA: NEGATIVE
Glucose, UA: NEGATIVE
Ketones, UA: NEGATIVE
Nitrite, UA: NEGATIVE
Spec Grav, UA: 1.02
Urobilinogen, UA: 0.2
pH, UA: 6.5

## 2013-04-17 DIAGNOSIS — H2640 Unspecified secondary cataract: Secondary | ICD-10-CM | POA: Diagnosis not present

## 2013-04-17 MED ORDER — CIPROFLOXACIN HCL 500 MG PO TABS: 500.0000 mg | ORAL_TABLET | Freq: Two times a day (BID) | ORAL | Status: DC

## 2013-04-17 NOTE — Progress Notes (Signed)
Quick Note:  Called and spoke with pt and pt is aware. ______ 

## 2013-04-17 NOTE — Addendum Note (Signed)
Addended by: Terressa Koyanagi on: 04/17/2013 06:51 AM   Modules accepted: Orders

## 2013-04-18 LAB — URINE CULTURE: Colony Count: >=100000

## 2013-04-21 DIAGNOSIS — F341 Dysthymic disorder: Secondary | ICD-10-CM | POA: Diagnosis not present

## 2013-04-23 DIAGNOSIS — H02839 Dermatochalasis of unspecified eye, unspecified eyelid: Secondary | ICD-10-CM | POA: Diagnosis not present

## 2013-04-23 DIAGNOSIS — Z961 Presence of intraocular lens: Secondary | ICD-10-CM | POA: Diagnosis not present

## 2013-04-23 DIAGNOSIS — H268 Other specified cataract: Secondary | ICD-10-CM | POA: Diagnosis not present

## 2013-04-30 DIAGNOSIS — F341 Dysthymic disorder: Secondary | ICD-10-CM | POA: Diagnosis not present

## 2013-05-04 DIAGNOSIS — B079 Viral wart, unspecified: Secondary | ICD-10-CM | POA: Diagnosis not present

## 2013-05-04 DIAGNOSIS — D485 Neoplasm of uncertain behavior of skin: Secondary | ICD-10-CM | POA: Diagnosis not present

## 2013-06-18 DIAGNOSIS — F341 Dysthymic disorder: Secondary | ICD-10-CM | POA: Diagnosis not present

## 2013-06-30 ENCOUNTER — Ambulatory Visit: Payer: Medicare Other

## 2013-07-07 ENCOUNTER — Ambulatory Visit (INDEPENDENT_AMBULATORY_CARE_PROVIDER_SITE_OTHER): Payer: Medicare Other

## 2013-07-07 DIAGNOSIS — Z23 Encounter for immunization: Secondary | ICD-10-CM | POA: Diagnosis not present

## 2013-08-03 DIAGNOSIS — F341 Dysthymic disorder: Secondary | ICD-10-CM | POA: Diagnosis not present

## 2013-08-07 ENCOUNTER — Ambulatory Visit (INDEPENDENT_AMBULATORY_CARE_PROVIDER_SITE_OTHER): Payer: Medicare Other | Admitting: Family Medicine

## 2013-08-07 ENCOUNTER — Encounter: Payer: Self-pay | Admitting: Family Medicine

## 2013-08-07 VITALS — BP 124/88 | HR 91 | Temp 98.1°F | Wt 194.0 lb

## 2013-08-07 DIAGNOSIS — R300 Dysuria: Secondary | ICD-10-CM

## 2013-08-07 DIAGNOSIS — E669 Obesity, unspecified: Secondary | ICD-10-CM | POA: Insufficient documentation

## 2013-08-07 DIAGNOSIS — N390 Urinary tract infection, site not specified: Secondary | ICD-10-CM

## 2013-08-07 LAB — POCT URINALYSIS DIPSTICK
Bilirubin, UA: NEGATIVE
Glucose, UA: NEGATIVE
Ketones, UA: NEGATIVE
Nitrite, UA: NEGATIVE
Spec Grav, UA: >=1.03
Urobilinogen, UA: 0.2
pH, UA: 5.5

## 2013-08-07 MED ORDER — CIPROFLOXACIN HCL 500 MG PO TABS: 500.0000 mg | ORAL_TABLET | Freq: Two times a day (BID) | ORAL | Status: DC

## 2013-08-07 NOTE — Progress Notes (Signed)
Pre visit review using our clinic review tool, if applicable. No additional management support is needed unless otherwise documented below in the visit note. 

## 2013-08-07 NOTE — Progress Notes (Signed)
   Subjective:    Patient ID: Sara Logan, female    DOB: 1933-03-15, 79 y.o.   MRN: [id number]  HPI Acute visit Patient seen with 2 day history of dysuria and possible low-grade fever. She's had some urine frequency and mild burning with urination. Denies any back pain. No chills. She's had prior history of UTI most recently back in August.  Grew out Escherichia coli that point sensitive to Cipro. She has no known drug allergies. Denies any abdominal pain.  Past Medical History  Diagnosis Date  . Arthritis   . Depression   . Hyperlipidemia   . Osteopenia   . Thrombocytopenia   . Vitamin D deficiency   . Wrist fracture 2002    left   Past Surgical History  Procedure Laterality Date  . Joint replacement  2002    right total   . Carpal tunnel release  2008    right  . Appendectomy  1967    reports that she has never smoked. She does not have any smokeless tobacco history on file. She reports that she does not drink alcohol or use illicit drugs. family history includes Cancer in her mother and sister. No Known Allergies    Review of Systems  Constitutional: Negative for fever, chills and appetite change.  Gastrointestinal: Negative for nausea, vomiting, abdominal pain, diarrhea and constipation.  Genitourinary: Positive for dysuria and frequency.  Musculoskeletal: Negative for back pain.  Neurological: Negative for dizziness.       Objective:   Physical Exam  Constitutional: She appears well-developed and well-nourished.  HENT:  Head: Normocephalic and atraumatic.  Neck: Neck supple. No thyromegaly present.  Cardiovascular: Normal rate, regular rhythm and normal heart sounds.   Pulmonary/Chest: Breath sounds normal.  Abdominal: Soft. Bowel sounds are normal. There is no tenderness.          Assessment & Plan:  Probable uncomplicated cystitis. Ciprofloxacin 500 milligrams twice a day for 5 days. Increase fluid intake. Followup when necessary if symptoms  persist

## 2013-08-07 NOTE — Patient Instructions (Signed)
Urinary Tract Infection  Urinary tract infections (UTIs) can develop anywhere along your urinary tract. Your urinary tract is your body's drainage system for removing wastes and extra water. Your urinary tract includes two kidneys, two ureters, a bladder, and a urethra. Your kidneys are a pair of bean-shaped organs. Each kidney is about the size of your fist. They are located below your ribs, one on each side of your spine.  CAUSES  Infections are caused by microbes, which are microscopic organisms, including fungi, viruses, and bacteria. These organisms are so small that they can only be seen through a microscope. Bacteria are the microbes that most commonly cause UTIs.  SYMPTOMS   Symptoms of UTIs may vary by age and gender of the patient and by the location of the infection. Symptoms in young women typically include a frequent and intense urge to urinate and a painful, burning feeling in the bladder or urethra during urination. Older women and men are more likely to be tired, shaky, and weak and have muscle aches and abdominal pain. A fever may mean the infection is in your kidneys. Other symptoms of a kidney infection include pain in your back or sides below the ribs, nausea, and vomiting.  DIAGNOSIS  To diagnose a UTI, your caregiver will ask you about your symptoms. Your caregiver also will ask to provide a urine sample. The urine sample will be tested for bacteria and white blood cells. White blood cells are made by your body to help fight infection.  TREATMENT   Typically, UTIs can be treated with medication. Because most UTIs are caused by a bacterial infection, they usually can be treated with the use of antibiotics. The choice of antibiotic and length of treatment depend on your symptoms and the type of bacteria causing your infection.  HOME CARE INSTRUCTIONS   If you were prescribed antibiotics, take them exactly as your caregiver instructs you. Finish the medication even if you feel better after you  have only taken some of the medication.   Drink enough water and fluids to keep your urine clear or pale yellow.   Avoid caffeine, tea, and carbonated beverages. They tend to irritate your bladder.   Empty your bladder often. Avoid holding urine for long periods of time.   Empty your bladder before and after sexual intercourse.   After a bowel movement, women should cleanse from front to back. Use each tissue only once.  SEEK MEDICAL CARE IF:    You have back pain.   You develop a fever.   Your symptoms do not begin to resolve within 3 days.  SEEK IMMEDIATE MEDICAL CARE IF:    You have severe back pain or lower abdominal pain.   You develop chills.   You have nausea or vomiting.   You have continued burning or discomfort with urination.  MAKE SURE YOU:    Understand these instructions.   Will watch your condition.   Will get help right away if you are not doing well or get worse.  Document Released: 06/06/2005 Document Revised: 02/26/2012 Document Reviewed: 10/05/2011  [employer] Patient Information 2014 [employer]

## 2013-08-10 DIAGNOSIS — F341 Dysthymic disorder: Secondary | ICD-10-CM | POA: Diagnosis not present

## 2013-08-10 LAB — URINE CULTURE: Colony Count: 75000

## 2013-08-24 DIAGNOSIS — F341 Dysthymic disorder: Secondary | ICD-10-CM | POA: Diagnosis not present

## 2013-08-27 ENCOUNTER — Other Ambulatory Visit: Payer: Self-pay | Admitting: Family Medicine

## 2013-08-27 DIAGNOSIS — Z1231 Encounter for screening mammogram for malignant neoplasm of breast: Secondary | ICD-10-CM

## 2013-09-16 DIAGNOSIS — F341 Dysthymic disorder: Secondary | ICD-10-CM | POA: Diagnosis not present

## 2013-09-28 ENCOUNTER — Ambulatory Visit (HOSPITAL_COMMUNITY)
Admission: RE | Admit: 2013-09-28 | Discharge: 2013-09-28 | Disposition: A | Discharge status: Home / Self Care | Payer: Medicare Other | Source: Ambulatory Visit | Attending: Family Medicine | Admitting: Family Medicine

## 2013-09-28 DIAGNOSIS — Z1231 Encounter for screening mammogram for malignant neoplasm of breast: Secondary | ICD-10-CM | POA: Diagnosis not present

## 2013-09-30 DIAGNOSIS — F341 Dysthymic disorder: Secondary | ICD-10-CM | POA: Diagnosis not present

## 2013-10-07 DIAGNOSIS — F341 Dysthymic disorder: Secondary | ICD-10-CM | POA: Diagnosis not present

## 2013-10-14 DIAGNOSIS — F341 Dysthymic disorder: Secondary | ICD-10-CM | POA: Diagnosis not present

## 2013-12-03 DIAGNOSIS — F341 Dysthymic disorder: Secondary | ICD-10-CM | POA: Diagnosis not present

## 2013-12-09 DIAGNOSIS — F341 Dysthymic disorder: Secondary | ICD-10-CM | POA: Diagnosis not present

## 2013-12-31 ENCOUNTER — Other Ambulatory Visit: Payer: Self-pay | Admitting: Family Medicine

## 2014-01-05 DIAGNOSIS — F341 Dysthymic disorder: Secondary | ICD-10-CM | POA: Diagnosis not present

## 2014-01-12 DIAGNOSIS — Z0000 Encounter for general adult medical examination without abnormal findings: Secondary | ICD-10-CM | POA: Diagnosis not present

## 2014-01-13 DIAGNOSIS — H268 Other specified cataract: Secondary | ICD-10-CM | POA: Diagnosis not present

## 2014-01-13 DIAGNOSIS — H5210 Myopia, unspecified eye: Secondary | ICD-10-CM | POA: Diagnosis not present

## 2014-01-13 DIAGNOSIS — Z961 Presence of intraocular lens: Secondary | ICD-10-CM | POA: Diagnosis not present

## 2014-01-29 ENCOUNTER — Other Ambulatory Visit: Payer: Self-pay | Admitting: Family Medicine

## 2014-03-03 DIAGNOSIS — L570 Actinic keratosis: Secondary | ICD-10-CM | POA: Diagnosis not present

## 2014-03-03 DIAGNOSIS — L719 Rosacea, unspecified: Secondary | ICD-10-CM | POA: Diagnosis not present

## 2014-03-29 ENCOUNTER — Ambulatory Visit (INDEPENDENT_AMBULATORY_CARE_PROVIDER_SITE_OTHER): Payer: Medicare Other | Admitting: Family Medicine

## 2014-03-29 ENCOUNTER — Encounter: Payer: Self-pay | Admitting: Family Medicine

## 2014-03-29 VITALS — BP 130/72 | HR 91 | Temp 98.6°F | Wt 184.0 lb

## 2014-03-29 DIAGNOSIS — K3189 Other diseases of stomach and duodenum: Secondary | ICD-10-CM | POA: Diagnosis not present

## 2014-03-29 DIAGNOSIS — R1013 Epigastric pain: Secondary | ICD-10-CM

## 2014-03-29 DIAGNOSIS — R42 Dizziness and giddiness: Secondary | ICD-10-CM | POA: Diagnosis not present

## 2014-03-29 LAB — CBC WITH DIFFERENTIAL/PLATELET
Basophils Absolute: 0 10*3/uL (ref 0.0–0.1)
Basophils Relative: 0.2 % (ref 0.0–3.0)
Eosinophils Absolute: 0.2 10*3/uL (ref 0.0–0.7)
Eosinophils Relative: 2.5 % (ref 0.0–5.0)
HCT: 36 % (ref 36.0–46.0)
Hemoglobin: 11.9 g/dL — ABNORMAL LOW (ref 12.0–15.0)
Lymphocytes Relative: 24.6 % (ref 12.0–46.0)
Lymphs Abs: 2 10*3/uL (ref 0.7–4.0)
MCHC: 33.2 g/dL (ref 30.0–36.0)
MCV: 81.9 fl (ref 78.0–100.0)
Monocytes Absolute: 0.9 10*3/uL (ref 0.1–1.0)
Monocytes Relative: 10.7 % (ref 3.0–12.0)
Neutro Abs: 5 10*3/uL (ref 1.4–7.7)
Neutrophils Relative %: 62 % (ref 43.0–77.0)
Platelets: 161 10*3/uL (ref 150.0–400.0)
RBC: 4.39 Mil/uL (ref 3.87–5.11)
RDW: 16 % — ABNORMAL HIGH (ref 11.5–15.5)
WBC: 8.1 10*3/uL (ref 4.0–10.5)

## 2014-03-29 NOTE — Progress Notes (Signed)
   Subjective:    Patient ID: Sara Logan, female    DOB: [birth year], 80 y.o.   MRN: [id number]  Dizziness Associated symptoms include abdominal pain. Pertinent negatives include no chest pain, coughing, fever, nausea, vomiting or weakness.   Patient complains of lightheadedness and dizziness. Onset several weeks ago. Her symptoms are worse with things like going from sitting to standing. No history of syncope. No chest pains. No dyspnea. She does also complain of some indigestion at times. Symptoms seem to be worse after eating. She describes feeling "unsettled" after eating. She did notice Tums help somewhat. Denies any localizing pain  She has lost some weight this year apparently due to her efforts. No recent diarrhea. No nausea or vomiting. She does not take any antihypertensives. She remains on Effexor but no other medications. She drinks minimal caffeine. No alcohol. Denies any melena or bloody stools.  Past Medical History  Diagnosis Date  . Arthritis   . Depression   . Hyperlipidemia   . Osteopenia   . Thrombocytopenia   . Vitamin D deficiency   . Wrist fracture 2002    left   Past Surgical History  Procedure Laterality Date  . Joint replacement  2002    right total   . Carpal tunnel release  2008    right  . Appendectomy  1967    reports that she has never smoked. She does not have any smokeless tobacco history on file. She reports that she does not drink alcohol or use illicit drugs. family history includes Cancer in her mother and sister. No Known Allergies    Review of Systems  Constitutional: Negative for fever, appetite change and unexpected weight change.  Respiratory: Negative for cough and shortness of breath.   Cardiovascular: Negative for chest pain, palpitations and leg swelling.  Gastrointestinal: Positive for abdominal pain. Negative for nausea, vomiting, diarrhea, constipation, blood in stool and abdominal distention.  Neurological: Positive for  dizziness and light-headedness. Negative for seizures, syncope and weakness.  Hematological: Negative for adenopathy.       Objective:   Physical Exam  Constitutional: She appears well-developed and well-nourished. No distress.  Cardiovascular: Normal rate and regular rhythm.   Pulmonary/Chest: Effort normal and breath sounds normal. No respiratory distress. She has no wheezes. She has no rales.  Abdominal: Soft. Bowel sounds are normal. She exhibits no distension and no mass. There is no tenderness. There is no rebound and no guarding.  Musculoskeletal: She exhibits no edema.          Assessment & Plan:  #1 dizziness. Patient has documented orthostasis with sitting blood pressure left arm 130/70 and standing 102/60. Minimally symptomatic. Does not take any antihypertensives and clinically does not appear dehydrated though she may be slightly volume depleted. Check basic metabolic panel and CBC. Increase fluid intake. Consider further labs if symptoms persist #2 dyspepsia. She will try over-the-counter Zantac or Pepcid for symptom relief.

## 2014-03-29 NOTE — Patient Instructions (Addendum)
Drink plenty of fluids Change positions slowly. Consider Zantac or Pepcid for indigestion symptoms. We will call you with lab results.

## 2014-03-29 NOTE — Progress Notes (Signed)
Pre visit review using our clinic review tool, if applicable. No additional management support is needed unless otherwise documented below in the visit note. 

## 2014-03-30 LAB — BASIC METABOLIC PANEL
BUN: 20 mg/dL (ref 6–23)
CO2: 30 mEq/L (ref 19–32)
Calcium: 9.5 mg/dL (ref 8.4–10.5)
Chloride: 105 mEq/L (ref 96–112)
Creatinine, Ser: 1 mg/dL (ref 0.4–1.2)
GFR: 56.77 mL/min — ABNORMAL LOW (ref >60.00)
Glucose, Bld: 107 mg/dL — ABNORMAL HIGH (ref 70–99)
Potassium: 4.4 mEq/L (ref 3.5–5.1)
Sodium: 139 mEq/L (ref 135–145)

## 2014-04-01 ENCOUNTER — Telehealth: Payer: Self-pay | Admitting: Family Medicine

## 2014-04-01 NOTE — Telephone Encounter (Signed)
Mailed results to home address.

## 2014-04-01 NOTE — Telephone Encounter (Signed)
Pt would like a copy of last blood work results mail to her

## 2014-04-14 ENCOUNTER — Ambulatory Visit (INDEPENDENT_AMBULATORY_CARE_PROVIDER_SITE_OTHER)
Admission: RE | Admit: 2014-04-14 | Discharge: 2014-04-14 | Disposition: A | Discharge status: Home / Self Care | Payer: Medicare Other | Source: Ambulatory Visit | Attending: Physician Assistant | Admitting: Physician Assistant

## 2014-04-14 ENCOUNTER — Encounter: Payer: Self-pay | Admitting: Physician Assistant

## 2014-04-14 ENCOUNTER — Ambulatory Visit (INDEPENDENT_AMBULATORY_CARE_PROVIDER_SITE_OTHER): Payer: Medicare Other | Admitting: Physician Assistant

## 2014-04-14 VITALS — BP 128/78 | HR 84 | Temp 98.3°F | Resp 18 | Wt 184.0 lb

## 2014-04-14 DIAGNOSIS — J029 Acute pharyngitis, unspecified: Secondary | ICD-10-CM | POA: Diagnosis not present

## 2014-04-14 DIAGNOSIS — R05 Cough: Secondary | ICD-10-CM

## 2014-04-14 DIAGNOSIS — R059 Cough, unspecified: Secondary | ICD-10-CM

## 2014-04-14 DIAGNOSIS — R0602 Shortness of breath: Secondary | ICD-10-CM | POA: Diagnosis not present

## 2014-04-14 NOTE — Progress Notes (Signed)
Subjective:    Patient ID: Sara Logan, female    DOB: 24-Nov-1932, 80 y.o.   MRN: [id number]  Cough This is a new problem. The current episode started 1 to 4 weeks ago. The problem has been waxing and waning. The problem occurs hourly. The cough is non-productive. Associated symptoms include heartburn, nasal congestion, a sore throat (aggravated by eating dry things) and shortness of breath (comes and goes.). Pertinent negatives include no chest pain, chills, ear congestion, ear pain, fever, headaches, hemoptysis, myalgias, postnasal drip, rash, rhinorrhea, sweats, weight loss or wheezing. Treatments tried: tylenol. The treatment provided mild relief. There is no history of asthma, COPD or environmental allergies.      Review of Systems  Constitutional: Negative for fever, chills and weight loss.  HENT: Positive for sore throat (aggravated by eating dry things). Negative for ear pain, postnasal drip, rhinorrhea and sinus pressure.   Respiratory: Positive for cough and shortness of breath (comes and goes.). Negative for hemoptysis and wheezing.   Cardiovascular: Negative for chest pain.  Gastrointestinal: Positive for heartburn. Negative for nausea, vomiting and diarrhea.  Musculoskeletal: Negative for myalgias.  Skin: Negative for rash.  Allergic/Immunologic: Negative for environmental allergies.  Neurological: Negative for syncope and headaches.  All other systems reviewed and are negative.    Past Medical History  Diagnosis Date  . Arthritis   . Depression   . Hyperlipidemia   . Osteopenia   . Thrombocytopenia   . Vitamin D deficiency   . Wrist fracture 2002    left    History   Social History  . Marital Status: Married    Spouse Name: N/A    Number of Children: N/A  . Years of Education: N/A   Occupational History  . Not on file.   Social History Main Topics  . Smoking status: Never Smoker   . Smokeless tobacco: Not on file  . Alcohol Use: No  . Drug Use: No   . Sexual Activity:    Other Topics Concern  . Not on file   Social History Narrative  . No narrative on file    Past Surgical History  Procedure Laterality Date  . Joint replacement  2002    right total   . Carpal tunnel release  2008    right  . Appendectomy  1967    Family History  Problem Relation Age of Onset  . Cancer Mother     breast  . Cancer Sister     breast    No Known Allergies  Current Outpatient Prescriptions on File Prior to Visit  Medication Sig Dispense Refill  . Biotin 10 MG TABS Take by mouth daily.      . Cholecalciferol (D3 DOTS) 2000 UNITS TBDP Take by mouth daily.        . Probiotic Product (PRO-BIOTIC BLEND PO) Take by mouth daily.      Marland Kitchen venlafaxine XR (EFFEXOR-XR) 75 MG 24 hr capsule TAKE 2 CAPSULES (150MG ) BY MOUTH DAILY.  60 capsule  3  . vitamin C (ASCORBIC ACID) 500 MG tablet Take 500 mg by mouth daily.      . vitamin E 100 UNIT capsule Take 100 Units by mouth daily.       No current facility-administered medications on file prior to visit.    EXAM: BP 128/78  Pulse 84  Temp(Src) 98.3 F (36.8 C) (Oral)  Resp 18  Wt 184 lb (83.462 kg)  SpO2 97%     Objective:  Physical Exam  Nursing note and vitals reviewed. Constitutional: She is oriented to person, place, and time. She appears well-developed and well-nourished. No distress.  HENT:  Head: Normocephalic and atraumatic.  Right Ear: External ear normal.  Left Ear: External ear normal.  Nose: Nose normal.  Mouth/Throat: No oropharyngeal exudate.  Oropharynx is slightly erythematous, no exudate. Bilateral TMs normal. Bilateral frontal and maxillary sinuses non-TTP.  Eyes: Conjunctivae and EOM are normal. Pupils are equal, round, and reactive to light.  Neck: Normal range of motion. Neck supple.  Cardiovascular: Normal rate, regular rhythm and intact distal pulses.   Pulmonary/Chest: Effort normal and breath sounds normal. No stridor. No respiratory distress. She has no  wheezes. She has no rales. She exhibits no tenderness.  Lymphadenopathy:    She has no cervical adenopathy.  Neurological: She is alert and oriented to person, place, and time.  Skin: Skin is warm and dry. She is not diaphoretic.  Psychiatric: She has a normal mood and affect. Her behavior is normal. Judgment and thought content normal.    Lab Results  Component Value Date   WBC 8.1 03/29/2014   HGB 11.9* 03/29/2014   HCT 36.0 03/29/2014   PLT 161.0 03/29/2014   GLUCOSE 107* 03/29/2014   CHOL 216* 11/17/2012   TRIG 157.0* 11/17/2012   HDL 46.90 11/17/2012   LDLDIRECT 147.0 11/17/2012   LDLCALC 127* 12/08/2010   NA 139 03/29/2014   K 4.4 03/29/2014   CL 105 03/29/2014   CREATININE 1.0 03/29/2014   BUN 20 03/29/2014   CO2 30 03/29/2014   TSH 1.42 03/05/2013         Assessment & Plan:  Rene was seen today for cough.  Diagnoses and associated orders for this visit:  Cough Comments: Possibly related to reflux, trial of PPI. CXR today due to length of symptoms. Add otc mucinex, nasal steroid, saline spray, antihistamine, rest, push fluids. - DG Chest 2 View; Future  Pharyngitis Comments: Possibly related to reflux or allergies. Trial of antihistamine and PPI.    In addition to above, watchful waiting.  Return precautions provided, and patient handout on cough and allergies.  Plan to follow up as needed, or for worsening or persistent symptoms despite treatment.  Patient Instructions  You have a chest x-ray done today at the [place] office.  Try taking the Prilosec as directed by Dr. Elease Hashimoto and see if this helps her reflux and in addition your sore throat and cough symptoms.  Also try an over-the-counter antihistamine such as Allegra, Zyrtec, or Claritin, and see if these relieve your irritated sinuses and throat.  These can sometimes cause drowsiness, so try taking them first in the evening prior to bedtime.  Additionally, you can try sinus rinses and saline spray to keep your  nose moist since it is feeling dry.  Also make sure that you're drinking adequate water.  If emergency symptoms discussed during visit developed, seek medical attention immediately.  Followup as needed, or for worsening or persistent symptoms despite treatment.

## 2014-04-14 NOTE — Patient Instructions (Addendum)
You have a chest x-ray done today at the [place] office.  Try taking the Prilosec as directed by Dr. Elease Hashimoto and see if this helps her reflux and in addition your sore throat and cough symptoms.  Also try an over-the-counter antihistamine such as Allegra, Zyrtec, or Claritin, and see if these relieve your irritated sinuses and throat.  These can sometimes cause drowsiness, so try taking them first in the evening prior to bedtime.  Additionally, you can try sinus rinses and saline spray to keep your nose moist since it is feeling dry.  Also make sure that you're drinking adequate water.  If emergency symptoms discussed during visit developed, seek medical attention immediately.  Followup as needed, or for worsening or persistent symptoms despite treatment.   Allergies  Allergies may happen from anything your body is sensitive to. This may be food, medicines, pollens, chemicals, and many other things. Food allergies can be severe and deadly.  HOME CARE  If you do not know what causes a reaction, keep a diary. Write down the foods you ate and the symptoms that followed. Avoid foods that cause reactions.  If you have red raised spots (hives) or a rash:  Take medicine as told by your doctor.  Use medicines for red raised spots and itching as needed.  Apply cold cloths (compresses) to the skin. Take a cool bath. Avoid hot baths or showers.  If you are severely allergic:  It is often necessary to go to the hospital after you have treated your reaction.  Wear your medical alert jewelry.  You and your family must learn how to give a allergy shot or use an allergy kit (anaphylaxis kit).  Always carry your allergy kit or shot with you. Use this medicine as told by your doctor if a severe reaction is occurring. GET HELP RIGHT AWAY IF:  You have trouble breathing or are making high-pitched whistling sounds (wheezing).  You have a tight feeling in your chest or throat.  You have a puffy  (swollen) mouth.  You have red raised spots, puffiness (swelling), or itching all over your body.  You have had a severe reaction that was helped by your allergy kit or shot. The reaction can return once the medicine has worn off.  You think you are having a food allergy. Symptoms most often happen within 30 minutes of eating a food.  Your symptoms have not gone away within 2 days or are getting worse.  You have new symptoms.  You want to retest yourself with a food or drink you think causes an allergic reaction. Only do this under the care of a doctor. MAKE SURE YOU:   Understand these instructions.  Will watch your condition.  Will get help right away if you are not doing well or get worse. Document Released: 12/22/2012 Document Reviewed: 12/22/2012 [facility] Patient Information 2015 [place]. This information is not intended to replace advice given to you by your health care provider. Make sure you discuss any questions you have with your health care provider. Cough, Adult  A cough is a reflex. It helps you clear your throat and airways. A cough can help heal your body. A cough can last 2 or 3 weeks (acute) or may last more than 8 weeks (chronic). Some common causes of a cough can include an infection, allergy, or a cold. HOME CARE  Only take medicine as told by your doctor.  If given, take your medicines (antibiotics) as told. Finish them even if  you start to feel better.  Use a cold steam vaporizer or humidifier in your home. This can help loosen thick spit (secretions).  Sleep so you are almost sitting up (semi-upright). Use pillows to do this. This helps reduce coughing.  Rest as needed.  Stop smoking if you smoke. GET HELP RIGHT AWAY IF:  You have yellowish-white fluid (pus) in your thick spit.  Your cough gets worse.  Your medicine does not reduce coughing, and you are losing sleep.  You cough up blood.  You have trouble breathing.  Your pain gets worse  and medicine does not help.  You have a fever. MAKE SURE YOU:   Understand these instructions.  Will watch your condition.  Will get help right away if you are not doing well or get worse. Document Released: 05/10/2011 Document Revised: 01/11/2014 Document Reviewed: 05/10/2011 [facility] Patient Information 2015 [place], [place]. This information is not intended to replace advice given to you by your health care provider. Make sure you discuss any questions you have with your health care provider.

## 2014-04-14 NOTE — Progress Notes (Signed)
Pre visit review using our clinic review tool, if applicable. No additional management support is needed unless otherwise documented below in the visit note. 

## 2014-04-23 ENCOUNTER — Ambulatory Visit: Payer: Medicare Other | Admitting: Physician Assistant

## 2014-04-23 ENCOUNTER — Encounter: Payer: Self-pay | Admitting: Physician Assistant

## 2014-04-23 ENCOUNTER — Ambulatory Visit (INDEPENDENT_AMBULATORY_CARE_PROVIDER_SITE_OTHER): Payer: Medicare Other | Admitting: Physician Assistant

## 2014-04-23 VITALS — BP 130/80 | HR 72 | Temp 98.4°F | Resp 18 | Wt 182.0 lb

## 2014-04-23 DIAGNOSIS — R300 Dysuria: Secondary | ICD-10-CM | POA: Diagnosis not present

## 2014-04-23 DIAGNOSIS — J029 Acute pharyngitis, unspecified: Secondary | ICD-10-CM

## 2014-04-23 LAB — POCT URINALYSIS DIPSTICK
Bilirubin, UA: NEGATIVE
Glucose, UA: NEGATIVE
Ketones, UA: NEGATIVE
Leukocytes, UA: NEGATIVE
Nitrite, UA: NEGATIVE
Protein, UA: NEGATIVE
Spec Grav, UA: 1.02
Urobilinogen, UA: 0.2
pH, UA: 6

## 2014-04-23 LAB — URINALYSIS, ROUTINE W REFLEX MICROSCOPIC
Bilirubin Urine: NEGATIVE
Hgb urine dipstick: NEGATIVE
Ketones, ur: NEGATIVE
Nitrite: NEGATIVE
Renal Epithel, UA: NONE SEEN
Specific Gravity, Urine: 1.02 (ref 1.000–1.030)
Urine Glucose: NEGATIVE
Urobilinogen, UA: 0.2 (ref 0.0–1.0)
pH: 6 (ref 5.0–8.0)

## 2014-04-23 NOTE — Progress Notes (Signed)
Subjective:    Patient ID: Sara Logan, female    DOB: [birth year], 80 y.o.   MRN: [id number]  Urinary Tract Infection  This is a new problem. The current episode started in the past 7 days. The problem occurs intermittently. The problem has been waxing and waning. The quality of the pain is described as aching and burning. The pain is mild. There has been no fever. Associated symptoms include frequency and urgency. Pertinent negatives include no chills, discharge, flank pain, hematuria, hesitancy, nausea, sweats or vomiting. She has tried increased fluids for the symptoms. The treatment provided no relief. Her past medical history is significant for recurrent UTIs (in the past). There is no history of catheterization, kidney stones, a single kidney, urinary stasis or a urological procedure.   The pt is also complaining of sore throat still. This has been present for a little over a week. She is experiencing dysphagia, even with small pieces of food. We had recommended she try a PPI therapy as she had been experiencing a chronic cough with the sore throat, however she denies trying this, and instead she is taking tums PRN for reflux symptoms. Patient denies fevers, chills, nausea, vomiting, diarrhea, shortness of breath, chest pain, headache, syncope.   Review of Systems  Constitutional: Negative for fever and chills.  Respiratory: Negative for shortness of breath.   Cardiovascular: Negative for chest pain.  Gastrointestinal: Negative for nausea, vomiting and diarrhea.  Genitourinary: Positive for dysuria, urgency and frequency. Negative for hesitancy, hematuria and flank pain.  Neurological: Negative for syncope and headaches.  All other systems reviewed and are negative.    Past Medical History  Diagnosis Date  . Arthritis   . Depression   . Hyperlipidemia   . Osteopenia   . Thrombocytopenia   . Vitamin D deficiency   . Wrist fracture 2002    left    History   Social History    . Marital Status: Married    Spouse Name: N/A    Number of Children: N/A  . Years of Education: N/A   Occupational History  . Not on file.   Social History Main Topics  . Smoking status: Never Smoker   . Smokeless tobacco: Not on file  . Alcohol Use: No  . Drug Use: No  . Sexual Activity:    Other Topics Concern  . Not on file   Social History Narrative  . No narrative on file    Past Surgical History  Procedure Laterality Date  . Joint replacement  2002    right total   . Carpal tunnel release  2008    right  . Appendectomy  1967    Family History  Problem Relation Age of Onset  . Cancer Mother     breast  . Cancer Sister     breast    No Known Allergies  Current Outpatient Prescriptions on File Prior to Visit  Medication Sig Dispense Refill  . Biotin 10 MG TABS Take by mouth daily.      . Cholecalciferol (D3 DOTS) 2000 UNITS TBDP Take by mouth daily.        . Probiotic Product (PRO-BIOTIC BLEND PO) Take by mouth daily.      Marland Kitchen venlafaxine XR (EFFEXOR-XR) 75 MG 24 hr capsule TAKE 2 CAPSULES (150MG ) BY MOUTH DAILY.  60 capsule  3  . vitamin C (ASCORBIC ACID) 500 MG tablet Take 500 mg by mouth daily.      . vitamin E  100 UNIT capsule Take 100 Units by mouth daily.       No current facility-administered medications on file prior to visit.    EXAM: BP 130/80  Pulse 72  Temp(Src) 98.4 F (36.9 C) (Oral)  Resp 18  Wt 182 lb (82.555 kg)     Objective:   Physical Exam  Nursing note and vitals reviewed. Constitutional: She is oriented to person, place, and time. She appears well-developed and well-nourished. No distress.  HENT:  Head: Normocephalic and atraumatic.  Mouth/Throat: No oropharyngeal exudate.  Oropharynx is slightly erythematous, no exudate.  Eyes: Conjunctivae and EOM are normal. Pupils are equal, round, and reactive to light.  Neck: Normal range of motion. Neck supple.  Cardiovascular: Normal rate, regular rhythm and intact distal pulses.    Pulmonary/Chest: Effort normal and breath sounds normal. No stridor. No respiratory distress. She has no wheezes. She has no rales. She exhibits no tenderness.  Lymphadenopathy:    She has no cervical adenopathy.  Neurological: She is alert and oriented to person, place, and time.  Skin: Skin is warm and dry. She is not diaphoretic.  Psychiatric: She has a normal mood and affect. Her behavior is normal. Judgment and thought content normal.     Lab Results  Component Value Date   WBC 8.1 03/29/2014   HGB 11.9* 03/29/2014   HCT 36.0 03/29/2014   PLT 161.0 03/29/2014   GLUCOSE 107* 03/29/2014   CHOL 216* 11/17/2012   TRIG 157.0* 11/17/2012   HDL 46.90 11/17/2012   LDLDIRECT 147.0 11/17/2012   LDLCALC 127* 12/08/2010   NA 139 03/29/2014   K 4.4 03/29/2014   CL 105 03/29/2014   CREATININE 1.0 03/29/2014   BUN 20 03/29/2014   CO2 30 03/29/2014   TSH 1.42 03/05/2013        Assessment & Plan:  Malesha was seen today for sore throat and urinary tract infection.  Diagnoses and associated orders for this visit:  Dysuria Comments: UA with blood, will culture and micro. Pt has f/u with PCP next week, will recheck urine dip then. - POCT urinalysis dipstick - Culture, Urine - Urinalysis with Reflex Microscopic  Sore throat Comments: w/cough. CXR normal. Again encourage pt to use PPI. Watchful waiting.    Return precautions provided, and patient handout on dysuria.  Plan to follow up as needed, or for worsening or persistent symptoms despite treatment.  Patient Instructions  We have ordered some tests for your urine for possible causes of your symptoms. We will call you with the results of these when available.  We will likely repeat your urine test at your previously scheduled followup with your PCP next Tuesday.  Continue pushing fluid hydration to try and reduce your symptoms.  Again, I encourage you to try an over-the-counter proton pump inhibitor, such as Prilosec or Nexium, in an  attempt to reduce your throat and cough symptoms.  If emergency symptoms discussed during visit developed, seek medical attention immediately.  Followup as needed, or for worsening or persistent symptoms despite treatment.

## 2014-04-23 NOTE — Progress Notes (Signed)
Pre visit review using our clinic review tool, if applicable. No additional management support is needed unless otherwise documented below in the visit note. 

## 2014-04-23 NOTE — Patient Instructions (Addendum)
We have ordered some tests for your urine for possible causes of your symptoms. We will call you with the results of these when available.  We will likely repeat your urine test at your previously scheduled followup with your PCP next Tuesday.  Continue pushing fluid hydration to try and reduce your symptoms.  Again, I encourage you to try an over-the-counter proton pump inhibitor, such as Prilosec or Nexium, in an attempt to reduce your throat and cough symptoms.  If emergency symptoms discussed during visit developed, seek medical attention immediately.  Followup as needed, or for worsening or persistent symptoms despite treatment.    Dysuria Dysuria is the medical term for pain with urination. There are many causes for dysuria, but urinary tract infection is the most common. If a urinalysis was performed it can show that there is a urinary tract infection. A urine culture confirms that you or your child is sick. You will need to follow up with a healthcare provider because:  If a urine culture was done you will need to know the culture results and treatment recommendations.  If the urine culture was positive, you or your child will need to be put on antibiotics or know if the antibiotics prescribed are the right antibiotics for your urinary tract infection.  If the urine culture is negative (no urinary tract infection), then other causes may need to be explored or antibiotics need to be stopped. Today laboratory work may have been done and there does not seem to be an infection. If cultures were done they will take at least 24 to 48 hours to be completed. Today x-rays may have been taken and they read as normal. No cause can be found for the problems. The x-rays may be re-read by a radiologist and you will be contacted if additional findings are made. You or your child may have been put on medications to help with this problem until you can see your primary caregiver. If the problems get  better, see your primary caregiver if the problems return. If you were given antibiotics (medications which kill germs), take all of the mediations as directed for the full course of treatment.  If laboratory work was done, you need to find the results. Leave a telephone number where you can be reached. If this is not possible, make sure you find out how you are to get test results. HOME CARE INSTRUCTIONS   Drink lots of fluids. For adults, drink eight, 8 ounce glasses of clear juice or water a day. For children, replace fluids as suggested by your caregiver.  Empty the bladder often. Avoid holding urine for long periods of time.  After a bowel movement, women should cleanse front to back, using each tissue only once.  Empty your bladder before and after sexual intercourse.  Take all the medicine given to you until it is gone. You may feel better in a few days, but TAKE ALL MEDICINE.  Avoid caffeine, tea, alcohol and carbonated beverages, because they tend to irritate the bladder.  In men, alcohol may irritate the prostate.  Only take over-the-counter or prescription medicines for pain, discomfort, or fever as directed by your caregiver.  If your caregiver has given you a follow-up appointment, it is very important to keep that appointment. Not keeping the appointment could result in a chronic or permanent injury, pain, and disability. If there is any problem keeping the appointment, you must call back to this facility for assistance. SEEK IMMEDIATE MEDICAL CARE IF:  Back pain develops.  A fever develops.  There is nausea (feeling sick to your stomach) or vomiting (throwing up).  Problems are no better with medications or are getting worse. MAKE SURE YOU:   Understand these instructions.  Will watch your condition.  Will get help right away if you are not doing well or get worse. Document Released: 05/25/2004 Document Revised: 11/19/2011 Document Reviewed: 04/01/2008 [facility]  Patient Information 2015 [place], [place]. This information is not intended to replace advice given to you by your health care provider. Make sure you discuss any questions you have with your health care provider.

## 2014-04-26 ENCOUNTER — Other Ambulatory Visit: Payer: Self-pay | Admitting: Physician Assistant

## 2014-04-26 DIAGNOSIS — N390 Urinary tract infection, site not specified: Secondary | ICD-10-CM

## 2014-04-26 LAB — URINE CULTURE: Colony Count: >=100000

## 2014-04-26 MED ORDER — CEFUROXIME AXETIL 500 MG PO TABS: 500.0000 mg | ORAL_TABLET | Freq: Two times a day (BID) | ORAL | Status: DC

## 2014-04-27 ENCOUNTER — Encounter: Payer: Self-pay | Admitting: Family Medicine

## 2014-04-27 ENCOUNTER — Ambulatory Visit (INDEPENDENT_AMBULATORY_CARE_PROVIDER_SITE_OTHER): Payer: Medicare Other | Admitting: Family Medicine

## 2014-04-27 VITALS — BP 130/78 | HR 87 | Temp 98.4°F | Ht 63.0 in | Wt 181.0 lb

## 2014-04-27 DIAGNOSIS — Z0000 Encounter for general adult medical examination without abnormal findings: Secondary | ICD-10-CM

## 2014-04-27 DIAGNOSIS — M949 Disorder of cartilage, unspecified: Secondary | ICD-10-CM | POA: Diagnosis not present

## 2014-04-27 DIAGNOSIS — F329 Major depressive disorder, single episode, unspecified: Secondary | ICD-10-CM

## 2014-04-27 DIAGNOSIS — R3915 Urgency of urination: Secondary | ICD-10-CM | POA: Diagnosis not present

## 2014-04-27 DIAGNOSIS — F3289 Other specified depressive episodes: Secondary | ICD-10-CM | POA: Diagnosis not present

## 2014-04-27 DIAGNOSIS — Z23 Encounter for immunization: Secondary | ICD-10-CM | POA: Diagnosis not present

## 2014-04-27 DIAGNOSIS — M899 Disorder of bone, unspecified: Secondary | ICD-10-CM

## 2014-04-27 DIAGNOSIS — M8580 Other specified disorders of bone density and structure, unspecified site: Secondary | ICD-10-CM

## 2014-04-27 MED ORDER — MIRABEGRON ER 25 MG PO TB24: 25.0000 mg | ORAL_TABLET | Freq: Every day | ORAL | Status: DC

## 2014-04-27 NOTE — Progress Notes (Signed)
Pre visit review using our clinic review tool, if applicable. No additional management support is needed unless otherwise documented below in the visit note. 

## 2014-04-27 NOTE — Patient Instructions (Signed)
Remember yearly flu vaccine. We gave Prevnar 13 pneumonia vaccine today Continue yearly mammogram and repeat bone density scan this year Continue daily calcium 1200 mg daily and vitamin D 1000 international units daily

## 2014-04-27 NOTE — Progress Notes (Signed)
Subjective:    Patient ID: Sara Logan, female    DOB: 06-Nov-1932, 80 y.o.   MRN: [id number]  HPI Patient is seen for Medicare wellness exam and medical followup. She has history of hyperlipidemia, obesity, degenerative arthritis, osteopenia, and urinary urgency. Recently seen for UTI and she just started antibiotics yesterday. Denies any fever or chills. No nausea or vomiting.  Takes calcium and vitamin D. Last DEXA scan 2013. She had colonoscopy 2008. Shingles vaccine 2008. Tetanus up-to-date. She gets yearly mammogram and plans to get again in December.  Urine urgency.  Took Vesicare previously but had some dry mouth.  Frequently gets up at night to urinate (about 3-4 times)  No recent falls. No confusion issues. She had yearly flu vaccine.  Hx of depression.  Stable on Effexor.  Her husband has advanced Parkinson's Disease and is in a nursing home and this has been very difficult for her.  Past Medical History  Diagnosis Date  . Arthritis   . Depression   . Hyperlipidemia   . Osteopenia   . Thrombocytopenia   . Vitamin D deficiency   . Wrist fracture 2002    left   Past Surgical History  Procedure Laterality Date  . Joint replacement  2002    right total   . Carpal tunnel release  2008    right  . Appendectomy  1967    reports that she has never smoked. She does not have any smokeless tobacco history on file. She reports that she does not drink alcohol or use illicit drugs. family history includes Cancer in her mother and sister. No Known Allergies  1.  Risk factors based on Past Medical , Social, and Family history reviewed and as indicated above with no changes 2.  Limitations in physical activities None.  No recent falls. 3.  Depression/mood No active depression or anxiety issues 4.  Hearing No defiits 5.  ADLs independent in all. 6.  Cognitive function (orientation to time and place, language, writing, speech,memory) no short or long term memory issues.  Language  and judgement intact. 7.  Home Safety no issues 8.  Height, weight, and visual acuity.all stable. 9.  Counseling discussed adequate calcium and Vit d and more weight bearing exercise. 10. Recommendation of preventive services. Yearly flu vaccine.  Yearly mammogram.  Tetanus every 10 years. 11. Labs based on risk factors -none 12. Care Plan as above. 13. Other Providers Dr Delfin Edis (GI) 14. Written schedule of screening/prevention services given to patient.     Review of Systems  Constitutional: Negative for fever, activity change, appetite change, fatigue and unexpected weight change.  HENT: Negative for ear pain, hearing loss, sore throat and trouble swallowing.   Eyes: Negative for visual disturbance.  Respiratory: Negative for cough and shortness of breath.   Cardiovascular: Negative for chest pain and palpitations.  Gastrointestinal: Negative for abdominal pain, diarrhea, constipation and blood in stool.  Genitourinary: Negative for dysuria and hematuria.  Musculoskeletal: Negative for arthralgias, back pain and myalgias.  Skin: Negative for rash.  Neurological: Negative for dizziness, syncope and headaches.  Hematological: Negative for adenopathy.  Psychiatric/Behavioral: Negative for confusion and dysphoric mood.       Objective:   Physical Exam  Constitutional: She is oriented to person, place, and time. She appears well-developed and well-nourished.  HENT:  Head: Normocephalic and atraumatic.  Eyes: EOM are normal. Pupils are equal, round, and reactive to light.  Neck: Normal range of motion. Neck supple. No thyromegaly  present.  Cardiovascular: Normal rate, regular rhythm and normal heart sounds.   No murmur heard. Pulmonary/Chest: Breath sounds normal. No respiratory distress. She has no wheezes. She has no rales.  Abdominal: Soft. Bowel sounds are normal. She exhibits no distension and no mass. There is no tenderness. There is no rebound and no guarding.    Genitourinary:  Breasts are symmetric with no mass.  Musculoskeletal: Normal range of motion. She exhibits no edema.  Lymphadenopathy:    She has no cervical adenopathy.  Neurological: She is alert and oriented to person, place, and time. She displays normal reflexes. No cranial nerve deficit.  Skin: No rash noted.  Psychiatric: She has a normal mood and affect. Her behavior is normal. Judgment and thought content normal.          Assessment & Plan:   #1 health maintenance. Immunizations up-to-date. Tetanus booster in 2 years. Yearly flu vaccine. Prevnar 13 given. Repeat DEXA scan this year. Continue yearly mammogram. #2 osteopenia. We've discussed things to reduce progression. Repeat DEXA this year when she gets f/u mammogram. #3 urine urgency. Consider trial of Myrbetriq.

## 2014-04-29 DIAGNOSIS — F341 Dysthymic disorder: Secondary | ICD-10-CM | POA: Diagnosis not present

## 2014-05-07 ENCOUNTER — Encounter: Payer: Self-pay | Admitting: Family Medicine

## 2014-05-07 ENCOUNTER — Ambulatory Visit (INDEPENDENT_AMBULATORY_CARE_PROVIDER_SITE_OTHER): Payer: Medicare Other | Admitting: Family Medicine

## 2014-05-07 VITALS — BP 132/76 | HR 90 | Temp 97.7°F | Wt 179.0 lb

## 2014-05-07 DIAGNOSIS — N3000 Acute cystitis without hematuria: Secondary | ICD-10-CM | POA: Diagnosis not present

## 2014-05-07 DIAGNOSIS — R319 Hematuria, unspecified: Secondary | ICD-10-CM

## 2014-05-07 LAB — POCT URINALYSIS DIPSTICK
Glucose, UA: NEGATIVE
Nitrite, UA: NEGATIVE
Spec Grav, UA: 1.015
Urobilinogen, UA: 1
pH, UA: 5.5

## 2014-05-07 MED ORDER — CIPROFLOXACIN HCL 500 MG PO TABS: 500.0000 mg | ORAL_TABLET | Freq: Two times a day (BID) | ORAL | Status: DC

## 2014-05-07 NOTE — Progress Notes (Signed)
   Subjective:    Patient ID: Sara Logan, female    DOB: April 22, 1933, 80 y.o.   MRN: [id number]  Dysuria  Associated symptoms include frequency. Pertinent negatives include no chills, hematuria, nausea or vomiting.   Patient seen with recurrent dysuria. She had recent UTI treated with Ceftin and did improve symptomatically. She has some urine frequency and burning. No gross hematuria. No fevers or chills. Denies any nausea, vomiting, or back pain. She did complete a full 7 day course of Ceftin.  Past Medical History  Diagnosis Date  . Arthritis   . Depression   . Hyperlipidemia   . Osteopenia   . Thrombocytopenia   . Vitamin D deficiency   . Wrist fracture 2002    left   Past Surgical History  Procedure Laterality Date  . Joint replacement  2002    right total   . Carpal tunnel release  2008    right  . Appendectomy  1967    reports that she has never smoked. She does not have any smokeless tobacco history on file. She reports that she does not drink alcohol or use illicit drugs. family history includes Cancer in her mother and sister. No Known Allergies    Review of Systems  Constitutional: Negative for fever, chills and appetite change.  Gastrointestinal: Negative for nausea, vomiting, abdominal pain, diarrhea and constipation.  Genitourinary: Positive for dysuria and frequency. Negative for hematuria.  Musculoskeletal: Negative for back pain.  Neurological: Negative for dizziness.       Objective:   Physical Exam  Constitutional: She appears well-developed and well-nourished.  HENT:  Head: Normocephalic and atraumatic.  Neck: Neck supple. No thyromegaly present.  Cardiovascular: Normal rate, regular rhythm and normal heart sounds.   Pulmonary/Chest: Breath sounds normal.  Abdominal: Soft. Bowel sounds are normal. There is no tenderness.          Assessment & Plan:  Probable recurrent acute cystitis. Urine culture sent. Cipro 500 mg twice a day for 7  days pending culture results. Drink plenty of fluids.

## 2014-05-07 NOTE — Patient Instructions (Signed)

## 2014-05-07 NOTE — Progress Notes (Signed)
Pre visit review using our clinic review tool, if applicable. No additional management support is needed unless otherwise documented below in the visit note. 

## 2014-05-09 LAB — URINE CULTURE: Colony Count: >=100000

## 2014-05-13 DIAGNOSIS — F341 Dysthymic disorder: Secondary | ICD-10-CM | POA: Diagnosis not present

## 2014-05-27 DIAGNOSIS — F341 Dysthymic disorder: Secondary | ICD-10-CM | POA: Diagnosis not present

## 2014-06-04 ENCOUNTER — Encounter: Payer: Self-pay | Admitting: Family Medicine

## 2014-06-04 ENCOUNTER — Ambulatory Visit (INDEPENDENT_AMBULATORY_CARE_PROVIDER_SITE_OTHER): Payer: Medicare Other | Admitting: Family Medicine

## 2014-06-04 VITALS — BP 130/74 | HR 94 | Temp 97.6°F | Wt 180.0 lb

## 2014-06-04 DIAGNOSIS — J029 Acute pharyngitis, unspecified: Secondary | ICD-10-CM

## 2014-06-04 NOTE — Progress Notes (Signed)
Pre visit review using our clinic review tool, if applicable. No additional management support is needed unless otherwise documented below in the visit note. 

## 2014-06-04 NOTE — Progress Notes (Signed)
   Subjective:    Patient ID: Sara Logan, female    DOB: [birth year], 80 y.o.   MRN: [id number]  Sore Throat  Associated symptoms include congestion and coughing. Pertinent negatives include no headaches.    Patient seen with pharyngitis. Onset about one week ago. She's had a postnasal drip and laryngitis symptoms as well. No headaches. Possibly some low-grade fevers initially but none now. She's had pain with swallowing over the past week. Cough which is mostly nonproductive. Using throat lozenges as with some relief. She's also taken occasional Tylenol. She is aware of frequent postnasal drip symptoms and thinks these are not allergic.  Past Medical History  Diagnosis Date  . Arthritis   . Depression   . Hyperlipidemia   . Osteopenia   . Thrombocytopenia   . Vitamin D deficiency   . Wrist fracture 2002    left   Past Surgical History  Procedure Laterality Date  . Joint replacement  2002    right total   . Carpal tunnel release  2008    right  . Appendectomy  1967    reports that she has never smoked. She does not have any smokeless tobacco history on file. She reports that she does not drink alcohol or use illicit drugs. family history includes Cancer in her mother and sister. No Known Allergies   Review of Systems  Constitutional: Positive for fatigue. Negative for fever and chills.  HENT: Positive for congestion, postnasal drip and sore throat.   Respiratory: Positive for cough.   Neurological: Negative for headaches.       Objective:   Physical Exam  Constitutional: She appears well-developed and well-nourished.  HENT:  Right Ear: External ear normal.  Left Ear: External ear normal.  Oropharynx minimal erythema. No exudate.  Neck: Neck supple.  Cardiovascular: Normal rate and regular rhythm.   Pulmonary/Chest: Effort normal and breath sounds normal. No respiratory distress. She has no wheezes. She has no rales.          Assessment & Plan:    Pharyngitis. Rapid strep negative. Suspect viral versus other postnasal drip related. She'll consider over-the-counter chlorpheniramine at night for postnasal drip symptoms. Symptomatic measures for sore throat. Followup when necessary

## 2014-06-04 NOTE — Patient Instructions (Signed)
Pharyngitis Pharyngitis is redness, pain, and swelling (inflammation) of your pharynx.  CAUSES  Pharyngitis is usually caused by infection. Most of the time, these infections are from viruses (viral) and are part of a cold. However, sometimes pharyngitis is caused by bacteria (bacterial). Pharyngitis can also be caused by allergies. Viral pharyngitis may be spread from person to person by coughing, sneezing, and personal items or utensils (cups, forks, spoons, toothbrushes). Bacterial pharyngitis may be spread from person to person by more intimate contact, such as kissing.  SIGNS AND SYMPTOMS  Symptoms of pharyngitis include:   Sore throat.   Tiredness (fatigue).   Low-grade fever.   Headache.  Joint pain and muscle aches.  Skin rashes.  Swollen lymph nodes.  Plaque-like film on throat or tonsils (often seen with bacterial pharyngitis). DIAGNOSIS  Your health care provider will ask you questions about your illness and your symptoms. Your medical history, along with a physical exam, is often all that is needed to diagnose pharyngitis. Sometimes, a rapid strep test is done. Other lab tests may also be done, depending on the suspected cause.  TREATMENT  Viral pharyngitis will usually get better in 3-4 days without the use of medicine. Bacterial pharyngitis is treated with medicines that kill germs (antibiotics).  HOME CARE INSTRUCTIONS   Drink enough water and fluids to keep your urine clear or pale yellow.   Only take over-the-counter or prescription medicines as directed by your health care provider:   If you are prescribed antibiotics, make sure you finish them even if you start to feel better.   Do not take aspirin.   Get lots of rest.   Gargle with 8 oz of salt water ( tsp of salt per 1 qt of water) as often as every 1-2 hours to soothe your throat.   Throat lozenges (if you are not at risk for choking) or sprays may be used to soothe your throat. SEEK MEDICAL  CARE IF:   You have large, tender lumps in your neck.  You have a rash.  You cough up green, yellow-brown, or bloody spit. SEEK IMMEDIATE MEDICAL CARE IF:   Your neck becomes stiff.  You drool or are unable to swallow liquids.  You vomit or are unable to keep medicines or liquids down.  You have severe pain that does not go away with the use of recommended medicines.  You have trouble breathing (not caused by a stuffy nose). MAKE SURE YOU:   Understand these instructions.  Will watch your condition.  Will get help right away if you are not doing well or get worse. Document Released: 08/27/2005 Document Revised: 06/17/2013 Document Reviewed: 05/04/2013 [facility] Patient Information 2015 [place], [place]. This information is not intended to replace advice given to you by your health care provider. Make sure you discuss any questions you have with your health care provider.  Consider chlorpheniramine (over the counter)  for post nasal drip symptoms at night.  This may be sedating.

## 2014-06-07 ENCOUNTER — Other Ambulatory Visit: Payer: Self-pay | Admitting: Family Medicine

## 2014-06-10 DIAGNOSIS — F341 Dysthymic disorder: Secondary | ICD-10-CM | POA: Diagnosis not present

## 2014-06-11 ENCOUNTER — Encounter: Payer: Self-pay | Admitting: Family Medicine

## 2014-06-11 ENCOUNTER — Ambulatory Visit (INDEPENDENT_AMBULATORY_CARE_PROVIDER_SITE_OTHER): Payer: Medicare Other | Admitting: Family Medicine

## 2014-06-11 VITALS — BP 128/78 | HR 91 | Temp 98.5°F | Wt 180.0 lb

## 2014-06-11 DIAGNOSIS — R490 Dysphonia: Secondary | ICD-10-CM

## 2014-06-11 NOTE — Patient Instructions (Signed)
We will call you with ENT referral. 

## 2014-06-11 NOTE — Progress Notes (Signed)
Pre visit review using our clinic review tool, if applicable. No additional management support is needed unless otherwise documented below in the visit note. 

## 2014-06-11 NOTE — Progress Notes (Signed)
   Subjective:    Patient ID: Sara Logan, female    DOB: [birth year], 80 y.o.   MRN: [id number]  HPI  Patient complains of some hoarseness for possibly up to 3 weeks now. She's also having some right throat pain and occasional difficulties with pain with swallowing. She takes Prilosec for reflux symptoms but has not had any recent active reflux. She has had right throat symptoms for about 6 weeks now. No appetite or weight changes. Nonsmoker. No fevers or chills. Recent rapid strep negative.  Past Medical History  Diagnosis Date  . Arthritis   . Depression   . Hyperlipidemia   . Osteopenia   . Thrombocytopenia   . Vitamin D deficiency   . Wrist fracture 2002    left   Past Surgical History  Procedure Laterality Date  . Joint replacement  2002    right total   . Carpal tunnel release  2008    right  . Appendectomy  1967    reports that she has never smoked. She does not have any smokeless tobacco history on file. She reports that she does not drink alcohol or use illicit drugs. family history includes Cancer in her mother and sister. No Known Allergies    Review of Systems  Constitutional: Negative for fever and chills.  HENT: Positive for sore throat, trouble swallowing and voice change. Negative for congestion.   Respiratory: Negative for cough and shortness of breath.        Objective:   Physical Exam  Constitutional: She appears well-developed and well-nourished.  HENT:  Mouth/Throat: Oropharynx is clear and moist.  Neck: Neck supple. No thyromegaly present.  Cardiovascular: Normal rate and regular rhythm.   Pulmonary/Chest: Effort normal and breath sounds normal. No respiratory distress. She has no wheezes. She has no rales.          Assessment & Plan:  Patient presents with some nonspecific right throat discomfort and persistent hoarseness now for about 3 weeks. She has nonfocal exam. Given duration of hoarseness, we'll set up ENT referral for further  evaluation.

## 2014-06-14 DIAGNOSIS — R1314 Dysphagia, pharyngoesophageal phase: Secondary | ICD-10-CM | POA: Diagnosis not present

## 2014-06-14 DIAGNOSIS — R490 Dysphonia: Secondary | ICD-10-CM | POA: Diagnosis not present

## 2014-06-23 DIAGNOSIS — R490 Dysphonia: Secondary | ICD-10-CM | POA: Diagnosis not present

## 2014-06-24 DIAGNOSIS — F341 Dysthymic disorder: Secondary | ICD-10-CM | POA: Diagnosis not present

## 2014-06-30 ENCOUNTER — Ambulatory Visit: Payer: Medicare Other

## 2014-07-12 DIAGNOSIS — R490 Dysphonia: Secondary | ICD-10-CM | POA: Diagnosis not present

## 2014-07-14 ENCOUNTER — Ambulatory Visit: Payer: Medicare Other

## 2014-07-14 ENCOUNTER — Ambulatory Visit (INDEPENDENT_AMBULATORY_CARE_PROVIDER_SITE_OTHER): Payer: Medicare Other | Admitting: *Deleted

## 2014-07-14 DIAGNOSIS — Z23 Encounter for immunization: Secondary | ICD-10-CM | POA: Diagnosis not present

## 2014-07-19 DIAGNOSIS — R490 Dysphonia: Secondary | ICD-10-CM | POA: Diagnosis not present

## 2014-07-22 DIAGNOSIS — F341 Dysthymic disorder: Secondary | ICD-10-CM | POA: Diagnosis not present

## 2014-07-27 ENCOUNTER — Telehealth: Payer: Self-pay

## 2014-07-27 NOTE — Telephone Encounter (Signed)
Pt walked into the office and states at the beginning of the year her insurance will change and Myrbetrig will cost her $274.96 for the first month and $38.49 for the second month.  Pt states she cannot afford this and has been working with an employee at [employer] and has found that Ditropan XL and Detrol LA are about the same. Pt would like to know which one she should switch to.  Pls advise and call pt.

## 2014-07-27 NOTE — Telephone Encounter (Signed)
We could switch to Detrol LA 4 mg once daily.  However, pt needs to realize that these two drugs are not equivalent to Myrbetriq and have higher incidence of dry mouth and constipation.

## 2014-07-28 DIAGNOSIS — F341 Dysthymic disorder: Secondary | ICD-10-CM | POA: Diagnosis not present

## 2014-07-28 MED ORDER — TOLTERODINE TARTRATE ER 4 MG PO CP24: 4.0000 mg | ORAL_CAPSULE | Freq: Every day | ORAL | Status: DC

## 2014-07-28 NOTE — Telephone Encounter (Signed)
Pt is informed. RX sent to pharmacy

## 2014-07-30 ENCOUNTER — Telehealth: Payer: Self-pay | Admitting: Family Medicine

## 2014-07-30 NOTE — Telephone Encounter (Signed)
PA for tolterodine tartrate was denied.  Pt must try and fail one of the following:  Gelnique, oxybutynin extended release, Oxytrol or Toviaz.

## 2014-07-31 NOTE — Telephone Encounter (Signed)
Try Toviaz 4 mg once daily

## 2014-08-02 NOTE — Telephone Encounter (Signed)
Pt is wanting to wait on trying this new medication. Until she can get her insurance right for 2016.

## 2014-08-03 ENCOUNTER — Ambulatory Visit (HOSPITAL_BASED_OUTPATIENT_CLINIC_OR_DEPARTMENT_OTHER): Admission: RE | Admit: 2014-08-03 | Payer: Medicare Other | Source: Ambulatory Visit | Admitting: Otolaryngology

## 2014-08-03 ENCOUNTER — Telehealth: Payer: Self-pay | Admitting: Family Medicine

## 2014-08-03 ENCOUNTER — Encounter (HOSPITAL_BASED_OUTPATIENT_CLINIC_OR_DEPARTMENT_OTHER): Admission: RE | Payer: Self-pay | Source: Ambulatory Visit

## 2014-08-03 SURGERY — LARYNGOSCOPY, DIRECT
Anesthesia: General

## 2014-08-03 NOTE — Telephone Encounter (Signed)
Patient states she will stay on the medication that Dr. Elease Hashimoto prescribed her.  mirabegron ER (MYRBETRIQ) 25 MG TB24 tablet

## 2014-08-03 NOTE — Telephone Encounter (Signed)
FYI

## 2014-08-09 DIAGNOSIS — R1314 Dysphagia, pharyngoesophageal phase: Secondary | ICD-10-CM | POA: Diagnosis not present

## 2014-08-09 DIAGNOSIS — M6281 Muscle weakness (generalized): Secondary | ICD-10-CM | POA: Diagnosis not present

## 2014-08-09 DIAGNOSIS — N3946 Mixed incontinence: Secondary | ICD-10-CM | POA: Diagnosis not present

## 2014-08-11 DIAGNOSIS — M6281 Muscle weakness (generalized): Secondary | ICD-10-CM | POA: Diagnosis not present

## 2014-08-11 DIAGNOSIS — N3946 Mixed incontinence: Secondary | ICD-10-CM | POA: Diagnosis not present

## 2014-08-11 DIAGNOSIS — F341 Dysthymic disorder: Secondary | ICD-10-CM | POA: Diagnosis not present

## 2014-08-12 DIAGNOSIS — M6281 Muscle weakness (generalized): Secondary | ICD-10-CM | POA: Diagnosis not present

## 2014-08-12 DIAGNOSIS — N3946 Mixed incontinence: Secondary | ICD-10-CM | POA: Diagnosis not present

## 2014-08-16 DIAGNOSIS — M6281 Muscle weakness (generalized): Secondary | ICD-10-CM | POA: Diagnosis not present

## 2014-08-16 DIAGNOSIS — N3946 Mixed incontinence: Secondary | ICD-10-CM | POA: Diagnosis not present

## 2014-08-18 DIAGNOSIS — N3946 Mixed incontinence: Secondary | ICD-10-CM | POA: Diagnosis not present

## 2014-08-18 DIAGNOSIS — M6281 Muscle weakness (generalized): Secondary | ICD-10-CM | POA: Diagnosis not present

## 2014-08-23 ENCOUNTER — Other Ambulatory Visit: Payer: Self-pay | Admitting: Family Medicine

## 2014-08-23 DIAGNOSIS — Z1231 Encounter for screening mammogram for malignant neoplasm of breast: Secondary | ICD-10-CM

## 2014-08-25 DIAGNOSIS — M6281 Muscle weakness (generalized): Secondary | ICD-10-CM | POA: Diagnosis not present

## 2014-08-25 DIAGNOSIS — F341 Dysthymic disorder: Secondary | ICD-10-CM | POA: Diagnosis not present

## 2014-08-25 DIAGNOSIS — N3946 Mixed incontinence: Secondary | ICD-10-CM | POA: Diagnosis not present

## 2014-08-27 DIAGNOSIS — N3946 Mixed incontinence: Secondary | ICD-10-CM | POA: Diagnosis not present

## 2014-08-27 DIAGNOSIS — M6281 Muscle weakness (generalized): Secondary | ICD-10-CM | POA: Diagnosis not present

## 2014-09-02 DIAGNOSIS — M6281 Muscle weakness (generalized): Secondary | ICD-10-CM | POA: Diagnosis not present

## 2014-09-02 DIAGNOSIS — N3946 Mixed incontinence: Secondary | ICD-10-CM | POA: Diagnosis not present

## 2014-09-06 DIAGNOSIS — H268 Other specified cataract: Secondary | ICD-10-CM | POA: Diagnosis not present

## 2014-09-06 DIAGNOSIS — Z961 Presence of intraocular lens: Secondary | ICD-10-CM | POA: Diagnosis not present

## 2014-09-14 DIAGNOSIS — N3946 Mixed incontinence: Secondary | ICD-10-CM | POA: Diagnosis not present

## 2014-09-14 DIAGNOSIS — M6281 Muscle weakness (generalized): Secondary | ICD-10-CM | POA: Diagnosis not present

## 2014-09-15 DIAGNOSIS — M6281 Muscle weakness (generalized): Secondary | ICD-10-CM | POA: Diagnosis not present

## 2014-09-15 DIAGNOSIS — N3946 Mixed incontinence: Secondary | ICD-10-CM | POA: Diagnosis not present

## 2014-09-22 DIAGNOSIS — F341 Dysthymic disorder: Secondary | ICD-10-CM | POA: Diagnosis not present

## 2014-09-29 ENCOUNTER — Ambulatory Visit (HOSPITAL_COMMUNITY): Payer: Medicare Other

## 2014-09-30 ENCOUNTER — Ambulatory Visit (HOSPITAL_COMMUNITY): Payer: Medicare Other

## 2014-10-01 ENCOUNTER — Ambulatory Visit (HOSPITAL_COMMUNITY): Payer: Medicare Other

## 2014-10-07 ENCOUNTER — Ambulatory Visit (HOSPITAL_COMMUNITY)
Admission: RE | Admit: 2014-10-07 | Discharge: 2014-10-07 | Disposition: A | Discharge status: Home / Self Care | Payer: Medicare Other | Source: Ambulatory Visit | Attending: Family Medicine | Admitting: Family Medicine

## 2014-10-07 DIAGNOSIS — Z1231 Encounter for screening mammogram for malignant neoplasm of breast: Secondary | ICD-10-CM | POA: Diagnosis not present

## 2014-10-11 ENCOUNTER — Other Ambulatory Visit: Payer: Self-pay | Admitting: Family Medicine

## 2014-10-11 DIAGNOSIS — R928 Other abnormal and inconclusive findings on diagnostic imaging of breast: Secondary | ICD-10-CM

## 2014-10-12 ENCOUNTER — Other Ambulatory Visit: Payer: Self-pay | Admitting: Family Medicine

## 2014-10-13 DIAGNOSIS — L821 Other seborrheic keratosis: Secondary | ICD-10-CM | POA: Diagnosis not present

## 2014-10-13 DIAGNOSIS — F341 Dysthymic disorder: Secondary | ICD-10-CM | POA: Diagnosis not present

## 2014-10-13 DIAGNOSIS — L570 Actinic keratosis: Secondary | ICD-10-CM | POA: Diagnosis not present

## 2014-10-19 ENCOUNTER — Ambulatory Visit
Admission: RE | Admit: 2014-10-19 | Discharge: 2014-10-19 | Disposition: A | Discharge status: Home / Self Care | Payer: Medicare Other | Source: Ambulatory Visit | Attending: Family Medicine | Admitting: Family Medicine

## 2014-10-19 DIAGNOSIS — N63 Unspecified lump in breast: Secondary | ICD-10-CM | POA: Diagnosis not present

## 2014-10-19 DIAGNOSIS — R928 Other abnormal and inconclusive findings on diagnostic imaging of breast: Secondary | ICD-10-CM

## 2014-10-20 DIAGNOSIS — Z029 Encounter for administrative examinations, unspecified: Secondary | ICD-10-CM | POA: Diagnosis not present

## 2014-10-27 DIAGNOSIS — F341 Dysthymic disorder: Secondary | ICD-10-CM | POA: Diagnosis not present

## 2014-11-10 DIAGNOSIS — F341 Dysthymic disorder: Secondary | ICD-10-CM | POA: Diagnosis not present

## 2014-12-01 DIAGNOSIS — F341 Dysthymic disorder: Secondary | ICD-10-CM | POA: Diagnosis not present

## 2014-12-23 DIAGNOSIS — F341 Dysthymic disorder: Secondary | ICD-10-CM | POA: Diagnosis not present

## 2015-01-13 DIAGNOSIS — F341 Dysthymic disorder: Secondary | ICD-10-CM | POA: Diagnosis not present

## 2015-02-17 DIAGNOSIS — F341 Dysthymic disorder: Secondary | ICD-10-CM | POA: Diagnosis not present

## 2015-03-17 DIAGNOSIS — F341 Dysthymic disorder: Secondary | ICD-10-CM | POA: Diagnosis not present

## 2015-03-18 ENCOUNTER — Encounter: Payer: Self-pay | Admitting: Family Medicine

## 2015-03-18 ENCOUNTER — Ambulatory Visit (INDEPENDENT_AMBULATORY_CARE_PROVIDER_SITE_OTHER): Payer: Medicare Other | Admitting: Family Medicine

## 2015-03-18 VITALS — BP 130/80 | HR 83 | Temp 98.3°F | Wt 184.0 lb

## 2015-03-18 DIAGNOSIS — Z862 Personal history of diseases of the blood and blood-forming organs and certain disorders involving the immune mechanism: Secondary | ICD-10-CM | POA: Diagnosis not present

## 2015-03-18 DIAGNOSIS — R5382 Chronic fatigue, unspecified: Secondary | ICD-10-CM | POA: Diagnosis not present

## 2015-03-18 DIAGNOSIS — E559 Vitamin D deficiency, unspecified: Secondary | ICD-10-CM

## 2015-03-18 LAB — CBC WITH DIFFERENTIAL/PLATELET
Basophils Absolute: 0 10*3/uL (ref 0.0–0.1)
Basophils Relative: 0.4 % (ref 0.0–3.0)
Eosinophils Absolute: 0.2 10*3/uL (ref 0.0–0.7)
Eosinophils Relative: 2.6 % (ref 0.0–5.0)
HCT: 33.6 % — ABNORMAL LOW (ref 36.0–46.0)
Hemoglobin: 10.8 g/dL — ABNORMAL LOW (ref 12.0–15.0)
Lymphocytes Relative: 20.9 % (ref 12.0–46.0)
Lymphs Abs: 1.4 10*3/uL (ref 0.7–4.0)
MCHC: 32.2 g/dL (ref 30.0–36.0)
MCV: 78.4 fl (ref 78.0–100.0)
Monocytes Absolute: 0.7 10*3/uL (ref 0.1–1.0)
Monocytes Relative: 10.9 % (ref 3.0–12.0)
Neutro Abs: 4.2 10*3/uL (ref 1.4–7.7)
Neutrophils Relative %: 65.2 % (ref 43.0–77.0)
Platelets: 155 10*3/uL (ref 150.0–400.0)
RBC: 4.28 Mil/uL (ref 3.87–5.11)
RDW: 17 % — ABNORMAL HIGH (ref 11.5–15.5)
WBC: 6.5 10*3/uL (ref 4.0–10.5)

## 2015-03-18 LAB — BASIC METABOLIC PANEL
BUN: 18 mg/dL (ref 6–23)
CO2: 29 mEq/L (ref 19–32)
Calcium: 9.1 mg/dL (ref 8.4–10.5)
Chloride: 105 mEq/L (ref 96–112)
Creatinine, Ser: 0.94 mg/dL (ref 0.40–1.20)
GFR: 60.83 mL/min (ref >60.00)
Glucose, Bld: 110 mg/dL — ABNORMAL HIGH (ref 70–99)
Potassium: 3.7 mEq/L (ref 3.5–5.1)
Sodium: 140 mEq/L (ref 135–145)

## 2015-03-18 LAB — TSH: TSH: 1.5 u[IU]/mL (ref 0.35–4.50)

## 2015-03-18 LAB — VITAMIN D 25 HYDROXY (VIT D DEFICIENCY, FRACTURES): VITD: 27.63 ng/mL — ABNORMAL LOW (ref 30.00–100.00)

## 2015-03-18 NOTE — Progress Notes (Signed)
Pre visit review using our clinic review tool, if applicable. No additional management support is needed unless otherwise documented below in the visit note. 

## 2015-03-18 NOTE — Progress Notes (Signed)
   Subjective:    Patient ID: Sara Logan, female    DOB: Nov 17, 1932, 81 y.o.   MRN: [id number]  HPI Patient seen with multiple issues. She has chronic problems including obesity, urinary urgency, osteopenia, chronic venous stasis edema, rosacea. She has history of low vitamin D and mild normocytic anemia. She has some fatigue issues and she attributes a lot of this to her husband having terminal Parkinson's disease. Also, one of their daughters recently moved to [place] and this has caused lots of stress. She feels that she is not dealing well with stress at times.  Poor sleep intermittently. She has had counseling in the past and may be interested in pursuing this again. She has diffuse abdominal bloating. No change in stools. No constipation or diarrhea. Weight is up 4 pounds from last visit. No chest pains. No dyspnea.  Past Medical History  Diagnosis Date  . Arthritis   . Depression   . Hyperlipidemia   . Osteopenia   . Thrombocytopenia   . Vitamin D deficiency   . Wrist fracture 2002    left   Past Surgical History  Procedure Laterality Date  . Joint replacement  2002    right total   . Carpal tunnel release  2008    right  . Appendectomy  1967    reports that she has never smoked. She does not have any smokeless tobacco history on file. She reports that she does not drink alcohol or use illicit drugs. family history includes Cancer in her mother and sister. No Known Allergies    Review of Systems  Constitutional: Positive for fatigue. Negative for unexpected weight change.  Eyes: Negative for visual disturbance.  Respiratory: Negative for cough, chest tightness, shortness of breath and wheezing.   Cardiovascular: Negative for chest pain, palpitations and leg swelling.  Gastrointestinal: Negative for nausea, vomiting and blood in stool.  Neurological: Negative for dizziness, seizures, syncope, weakness, light-headedness and headaches.       Objective:   Physical  Exam  Constitutional: She is oriented to person, place, and time. She appears well-developed and well-nourished. No distress.  Neck: Neck supple. No thyromegaly present.  Cardiovascular: Normal rate and regular rhythm.   Pulmonary/Chest: Effort normal and breath sounds normal. No respiratory distress. She has no wheezes. She has no rales. She exhibits no tenderness.  Musculoskeletal: She exhibits edema.  Neurological: She is alert and oriented to person, place, and time. No cranial nerve deficit.          Assessment & Plan:  #1 nonspecific fatigue. Probably largely stress related. Will check labs as below and we've recommended she consider scheduling follow-up with counseling regarding her situational stressors #2 history of vitamin D deficiency. Recheck vitamin D level  #3 history of mild normocytic anemia. Recheck CBC

## 2015-03-22 ENCOUNTER — Encounter: Payer: Self-pay | Admitting: *Deleted

## 2015-04-14 DIAGNOSIS — F341 Dysthymic disorder: Secondary | ICD-10-CM | POA: Diagnosis not present

## 2015-04-20 DIAGNOSIS — H52201 Unspecified astigmatism, right eye: Secondary | ICD-10-CM | POA: Diagnosis not present

## 2015-04-20 DIAGNOSIS — H5211 Myopia, right eye: Secondary | ICD-10-CM | POA: Diagnosis not present

## 2015-04-20 DIAGNOSIS — H3563 Retinal hemorrhage, bilateral: Secondary | ICD-10-CM | POA: Diagnosis not present

## 2015-04-20 DIAGNOSIS — Z961 Presence of intraocular lens: Secondary | ICD-10-CM | POA: Diagnosis not present

## 2015-04-20 DIAGNOSIS — H268 Other specified cataract: Secondary | ICD-10-CM | POA: Diagnosis not present

## 2015-04-21 DIAGNOSIS — F341 Dysthymic disorder: Secondary | ICD-10-CM | POA: Diagnosis not present

## 2015-04-22 ENCOUNTER — Encounter: Payer: Self-pay | Admitting: Family Medicine

## 2015-04-22 ENCOUNTER — Ambulatory Visit (INDEPENDENT_AMBULATORY_CARE_PROVIDER_SITE_OTHER): Payer: Medicare Other | Admitting: Family Medicine

## 2015-04-22 VITALS — BP 134/80 | HR 90 | Temp 98.5°F | Wt 193.0 lb

## 2015-04-22 DIAGNOSIS — E559 Vitamin D deficiency, unspecified: Secondary | ICD-10-CM | POA: Diagnosis not present

## 2015-04-22 DIAGNOSIS — E538 Deficiency of other specified B group vitamins: Secondary | ICD-10-CM | POA: Diagnosis not present

## 2015-04-22 DIAGNOSIS — D649 Anemia, unspecified: Secondary | ICD-10-CM

## 2015-04-22 DIAGNOSIS — R739 Hyperglycemia, unspecified: Secondary | ICD-10-CM

## 2015-04-22 DIAGNOSIS — R7309 Other abnormal glucose: Secondary | ICD-10-CM | POA: Diagnosis not present

## 2015-04-22 NOTE — Progress Notes (Signed)
Pre visit review using our clinic review tool, if applicable. No additional management support is needed unless otherwise documented below in the visit note. 

## 2015-04-22 NOTE — Progress Notes (Signed)
   Subjective:    Patient ID: Sara Logan, female    DOB: [birth year], 81 y.o.   MRN: [id number]  HPI Patient seen for follow-up after recent visit for fatigue and some abnormal labs. She also had recent eye exam at [facility]. She had a couple isolated retinal hemorrhages in both eyes. Was advised that she follow-up here to assess her blood pressure and rule out diabetes. She has no history of hypertension. No recent headaches. She had recent blood sugar of 110 but this was not fasting. No family history of diabetes. She has some urine frequency which is chronic and urine urgency but no polydipsia or weight changes recently.  Chronic normocytic anemia. Recent hemoglobin 10.8 with normal MCV. She had colonoscopy 2008. No recent bloody stools.  Patient had slightly low vitamin D 27.6 and started daily over-the-counter vitamin D 2000 international units once daily. She still has fatigue issues. She attributes a lot of this to increased stress with her husband who has advanced end-stage Parkinson's disease.  Past Medical History  Diagnosis Date  . Arthritis   . Depression   . Hyperlipidemia   . Osteopenia   . Thrombocytopenia   . Vitamin D deficiency   . Wrist fracture 2002    left   Past Surgical History  Procedure Laterality Date  . Joint replacement  2002    right total   . Carpal tunnel release  2008    right  . Appendectomy  1967    reports that she has never smoked. She does not have any smokeless tobacco history on file. She reports that she does not drink alcohol or use illicit drugs. family history includes Cancer in her mother and sister. No Known Allergies    Review of Systems  Constitutional: Positive for fatigue. Negative for chills and unexpected weight change.  Respiratory: Negative for cough, chest tightness, shortness of breath and wheezing.   Cardiovascular: Negative for chest pain, palpitations and leg swelling.  Genitourinary: Negative for  dysuria.  Neurological: Negative for dizziness, seizures, syncope, weakness, light-headedness and headaches.       Objective:   Physical Exam  Constitutional: She is oriented to person, place, and time. She appears well-developed and well-nourished.  HENT:  Mouth/Throat: Oropharynx is clear and moist.  Neck: Neck supple. No JVD present. No thyromegaly present.  Cardiovascular: Normal rate and regular rhythm.   Pulmonary/Chest: Effort normal and breath sounds normal. No respiratory distress. She has no wheezes. She has no rales.  Musculoskeletal: She exhibits no edema.  Neurological: She is alert and oriented to person, place, and time.          Assessment & Plan:  #1 hyperglycemia by recent nonfasting labs. Check hemoglobin A1c #2 normocytic anemia. This appears to be more chronic. Check further labs with serum iron, TIBC, ferritin, B12 #3 low vitamin D. She is taking over-the-counter replacement. Reassess in about 3-4 months

## 2015-04-23 LAB — VITAMIN B12: Vitamin B-12: 843 pg/mL (ref 211–911)

## 2015-04-23 LAB — IRON AND TIBC
%SAT: 5 % — ABNORMAL LOW (ref 20–55)
Iron: 22 ug/dL — ABNORMAL LOW (ref 42–145)
TIBC: 402 ug/dL (ref 250–470)
UIBC: 380 ug/dL (ref 125–400)

## 2015-04-23 LAB — HEMOGLOBIN A1C
Hgb A1c MFr Bld: 5.9 % — ABNORMAL HIGH (ref <5.7)
Mean Plasma Glucose: 123 mg/dL — ABNORMAL HIGH (ref <117)

## 2015-04-23 LAB — FERRITIN: Ferritin: 23 ng/mL (ref 10–291)

## 2015-04-25 ENCOUNTER — Other Ambulatory Visit: Payer: Self-pay | Admitting: Family Medicine

## 2015-04-25 DIAGNOSIS — E611 Iron deficiency: Secondary | ICD-10-CM

## 2015-04-27 ENCOUNTER — Ambulatory Visit: Payer: PRIVATE HEALTH INSURANCE | Admitting: Psychology

## 2015-05-02 ENCOUNTER — Other Ambulatory Visit (INDEPENDENT_AMBULATORY_CARE_PROVIDER_SITE_OTHER): Payer: Medicare Other

## 2015-05-02 DIAGNOSIS — D509 Iron deficiency anemia, unspecified: Secondary | ICD-10-CM | POA: Diagnosis not present

## 2015-05-02 LAB — POC HEMOCCULT BLD/STL (HOME/3-CARD/SCREEN)
Card #2 Fecal Occult Blod, POC: NEGATIVE
Card #3 Fecal Occult Blood, POC: NEGATIVE
Fecal Occult Blood, POC: NEGATIVE

## 2015-05-04 ENCOUNTER — Telehealth: Payer: Self-pay | Admitting: Family Medicine

## 2015-05-04 NOTE — Telephone Encounter (Signed)
Did not know she was returning a call and montrice can speak w/ her. Opened in error

## 2015-05-05 DIAGNOSIS — F341 Dysthymic disorder: Secondary | ICD-10-CM | POA: Diagnosis not present

## 2015-05-09 ENCOUNTER — Other Ambulatory Visit: Payer: Self-pay | Admitting: Family Medicine

## 2015-05-13 DIAGNOSIS — Z0000 Encounter for general adult medical examination without abnormal findings: Secondary | ICD-10-CM | POA: Diagnosis not present

## 2015-05-20 ENCOUNTER — Ambulatory Visit (INDEPENDENT_AMBULATORY_CARE_PROVIDER_SITE_OTHER): Payer: Medicare Other | Admitting: Family Medicine

## 2015-05-20 ENCOUNTER — Encounter: Payer: Self-pay | Admitting: Family Medicine

## 2015-05-20 VITALS — BP 130/80 | HR 80 | Temp 98.6°F | Wt 192.0 lb

## 2015-05-20 DIAGNOSIS — N3000 Acute cystitis without hematuria: Secondary | ICD-10-CM | POA: Diagnosis not present

## 2015-05-20 DIAGNOSIS — R300 Dysuria: Secondary | ICD-10-CM

## 2015-05-20 LAB — POCT URINALYSIS DIPSTICK
Bilirubin, UA: NEGATIVE
Glucose, UA: NEGATIVE
Ketones, UA: NEGATIVE
Nitrite, UA: POSITIVE
Spec Grav, UA: 1.02
Urobilinogen, UA: 0.2
pH, UA: 5

## 2015-05-20 MED ORDER — CIPROFLOXACIN HCL 500 MG PO TABS: 500.0000 mg | ORAL_TABLET | Freq: Two times a day (BID) | ORAL | Status: DC

## 2015-05-20 NOTE — Progress Notes (Signed)
Pre visit review using our clinic review tool, if applicable. No additional management support is needed unless otherwise documented below in the visit note. 

## 2015-05-20 NOTE — Progress Notes (Signed)
   Subjective:    Patient ID: Sara Logan, female    DOB: [birth year], 81 y.o.   MRN: [id number]  HPI Acute visit for 2 day history of dysuria. Urine frequency and burning with urination. Occasional chills. No fever documented. Denies any nausea or vomiting. No back pain. History of frequent UTIs in the past but not for approximately one year  Past Medical History  Diagnosis Date  . Arthritis   . Depression   . Hyperlipidemia   . Osteopenia   . Thrombocytopenia   . Vitamin D deficiency   . Wrist fracture 2002    left   Past Surgical History  Procedure Laterality Date  . Joint replacement  2002    right total   . Carpal tunnel release  2008    right  . Appendectomy  1967    reports that she has never smoked. She does not have any smokeless tobacco history on file. She reports that she does not drink alcohol or use illicit drugs. family history includes Cancer in her mother and sister. No Known Allergies    Review of Systems  Constitutional: Positive for chills and fatigue. Negative for fever and appetite change.  Respiratory: Negative for shortness of breath.   Gastrointestinal: Negative for nausea, vomiting, abdominal pain, diarrhea and constipation.  Genitourinary: Positive for dysuria and frequency.  Musculoskeletal: Negative for back pain.  Neurological: Negative for dizziness.       Objective:   Physical Exam  Constitutional: She appears well-developed and well-nourished.  Cardiovascular: Normal rate and regular rhythm.   Pulmonary/Chest: Effort normal and breath sounds normal. No respiratory distress. She has no wheezes. She has no rales.  Neurological: She is alert.          Assessment & Plan:  Probable uncomplicated cystitis. Urine culture sent. Cipro 500 mg twice a day for 7 days.

## 2015-05-20 NOTE — Patient Instructions (Signed)

## 2015-05-23 ENCOUNTER — Telehealth: Payer: Self-pay | Admitting: Family Medicine

## 2015-05-23 LAB — URINE CULTURE: Colony Count: >=100000

## 2015-05-23 MED ORDER — SULFAMETHOXAZOLE-TRIMETHOPRIM 800-160 MG PO TABS: 1.0000 | ORAL_TABLET | Freq: Two times a day (BID) | ORAL | Status: DC

## 2015-05-23 NOTE — Telephone Encounter (Signed)
Pt states that she is still having Dysuria.

## 2015-05-23 NOTE — Telephone Encounter (Signed)
See result note. Patient had resistant pathogen which explains why she was not improving. Recommend change to Septra DS 1 twice a day for 5 days but confirm she has no sulfa allergy

## 2015-05-23 NOTE — Telephone Encounter (Signed)
Pt said the medicine that was rx for her UTI is not working and would like a call back

## 2015-05-23 NOTE — Telephone Encounter (Signed)
Pt informed. No Sulfa allergy. Rx sent to pharmacy

## 2015-06-03 ENCOUNTER — Ambulatory Visit (INDEPENDENT_AMBULATORY_CARE_PROVIDER_SITE_OTHER): Payer: Medicare Other | Admitting: Family Medicine

## 2015-06-03 VITALS — BP 140/70 | HR 89 | Temp 98.4°F | Wt 193.9 lb

## 2015-06-03 DIAGNOSIS — D509 Iron deficiency anemia, unspecified: Secondary | ICD-10-CM

## 2015-06-03 NOTE — Progress Notes (Signed)
Pre visit review using our clinic review tool, if applicable. No additional management support is needed unless otherwise documented below in the visit note. 

## 2015-06-03 NOTE — Progress Notes (Signed)
   Subjective:    Patient ID: Sara Logan, female    DOB: 04-08-1933, 81 y.o.   MRN: [id number]  HPI Patient seen for follow-up. She had recent resistant UTI. She was placed on Cipro and culture came back positive for Escherichia coli resistant to Cipro. We transitioned her to Septra and symptoms then cleared. She's not had any further dysuria.  She's had recent issues with fatigue and malaise. Iron deficiency anemia. Started iron replacement and repeat hemoglobin recently through [facility] screening 12.7. Feels much better overall. We reviewed her other [facility] screening and basically all of her tests were negative with the exception of elevated C-reactive protein but this followed her UTI but only a few days  Past Medical History  Diagnosis Date  . Arthritis   . Depression   . Hyperlipidemia   . Osteopenia   . Thrombocytopenia   . Vitamin D deficiency   . Wrist fracture 2002    left   Past Surgical History  Procedure Laterality Date  . Joint replacement  2002    right total   . Carpal tunnel release  2008    right  . Appendectomy  1967    reports that she has never smoked. She does not have any smokeless tobacco history on file. She reports that she does not drink alcohol or use illicit drugs. family history includes Cancer in her mother and sister. No Known Allergies    Review of Systems  Constitutional: Negative for fever, chills, appetite change and unexpected weight change.  Respiratory: Negative for cough and shortness of breath.   Cardiovascular: Negative for chest pain.  Genitourinary: Negative for dysuria.       Objective:   Physical Exam  Constitutional: She appears well-developed and well-nourished.  Cardiovascular: Normal rate and regular rhythm.   Pulmonary/Chest: Effort normal and breath sounds normal. No respiratory distress. She has no wheezes. She has no rales.          Assessment & Plan:  #1 recent iron deficiency anemia. Corrected and  improved by recent hemoglobin. She has done well with iron replacement.  We did suggest consideration for repeat hemoglobin in a couple of months #2 recent UTI symptomatically resolved following antibiotics. Recent elevated C-reactive protein likely related to her UTI

## 2015-06-24 ENCOUNTER — Ambulatory Visit (INDEPENDENT_AMBULATORY_CARE_PROVIDER_SITE_OTHER): Payer: Medicare Other | Admitting: *Deleted

## 2015-06-24 DIAGNOSIS — Z23 Encounter for immunization: Secondary | ICD-10-CM

## 2015-08-11 DIAGNOSIS — F341 Dysthymic disorder: Secondary | ICD-10-CM | POA: Diagnosis not present

## 2015-08-16 DIAGNOSIS — D239 Other benign neoplasm of skin, unspecified: Secondary | ICD-10-CM | POA: Diagnosis not present

## 2015-08-16 DIAGNOSIS — L570 Actinic keratosis: Secondary | ICD-10-CM | POA: Diagnosis not present

## 2015-08-17 DIAGNOSIS — H0016 Chalazion left eye, unspecified eyelid: Secondary | ICD-10-CM | POA: Diagnosis not present

## 2015-08-17 DIAGNOSIS — H3563 Retinal hemorrhage, bilateral: Secondary | ICD-10-CM | POA: Diagnosis not present

## 2015-08-17 DIAGNOSIS — Z961 Presence of intraocular lens: Secondary | ICD-10-CM | POA: Diagnosis not present

## 2015-08-17 DIAGNOSIS — H268 Other specified cataract: Secondary | ICD-10-CM | POA: Diagnosis not present

## 2015-08-17 DIAGNOSIS — H0013 Chalazion right eye, unspecified eyelid: Secondary | ICD-10-CM | POA: Diagnosis not present

## 2015-08-18 DIAGNOSIS — F341 Dysthymic disorder: Secondary | ICD-10-CM | POA: Diagnosis not present

## 2015-08-30 DIAGNOSIS — F341 Dysthymic disorder: Secondary | ICD-10-CM | POA: Diagnosis not present

## 2015-09-08 ENCOUNTER — Encounter: Payer: Self-pay | Admitting: Family Medicine

## 2015-09-08 ENCOUNTER — Ambulatory Visit (INDEPENDENT_AMBULATORY_CARE_PROVIDER_SITE_OTHER): Payer: Medicare Other | Admitting: Family Medicine

## 2015-09-08 VITALS — BP 130/74 | HR 114 | Temp 98.3°F | Ht 63.0 in | Wt 191.0 lb

## 2015-09-08 DIAGNOSIS — R197 Diarrhea, unspecified: Secondary | ICD-10-CM

## 2015-09-08 NOTE — Progress Notes (Signed)
   Subjective:    Patient ID: Sara Logan, female    DOB: Jun 20, 1933, 81 y.o.   MRN: [id number]  HPI Acute visit for diarrhea. About 2 weeks ago had single episode of what she describes explosive diarrhea. This was a singular episode and she had no further problems until one episode similarly earlier this week  No diarrhea since then. No bloody diarrhea. Denies any appetite change, fever, chills, nausea, vomiting, or any bloody stools. No abdominal pain. No history of lactose intolerance or gluten intolerance She's had regular bowel movements since then and normal consistency and regularity No history of food allergy.  Past Medical History  Diagnosis Date  . Arthritis   . Depression   . Hyperlipidemia   . Osteopenia   . Thrombocytopenia ([place])   . Vitamin D deficiency   . Wrist fracture 2002    left   Past Surgical History  Procedure Laterality Date  . Joint replacement  2002    right total   . Carpal tunnel release  2008    right  . Appendectomy  1967    reports that she has never smoked. She does not have any smokeless tobacco history on file. She reports that she does not drink alcohol or use illicit drugs. family history includes Cancer in her mother and sister. No Known Allergies    Review of Systems  Constitutional: Negative for fever, chills, appetite change and unexpected weight change.  Gastrointestinal: Positive for diarrhea. Negative for nausea, vomiting, abdominal pain and blood in stool.       Objective:   Physical Exam  Constitutional: She appears well-developed and well-nourished.  HENT:  Mouth/Throat: Oropharynx is clear and moist.  Neck: Neck supple.  Cardiovascular: Normal rate and regular rhythm.   Pulmonary/Chest: Effort normal and breath sounds normal. No respiratory distress. She has no wheezes. She has no rales.  Abdominal: Soft. Bowel sounds are normal. She exhibits no distension and no mass. There is no tenderness. There is no rebound and  no guarding.          Assessment & Plan:  Diarrhea. She's had only 2 singular episodes which were divided about a week or so apart. No worrisome red flag symptoms-such as fever, bloody stools, abdominal pain, weight loss, etc. We discussed dietary factors to help reduce likelihood of diarrhea. We elected not to do stool studies at this point since she's had only 2 episodes separated over week apart. Follow-up for any persistent diarrhea or new symptoms

## 2015-09-08 NOTE — Progress Notes (Signed)
Pre visit review using our clinic review tool, if applicable. No additional management support is needed unless otherwise documented below in the visit note. 

## 2015-09-08 NOTE — Patient Instructions (Signed)

## 2015-09-21 ENCOUNTER — Telehealth: Payer: Self-pay | Admitting: Family Medicine

## 2015-09-21 NOTE — Telephone Encounter (Signed)
Left message on pt answering machine per pt.

## 2015-09-21 NOTE — Telephone Encounter (Signed)
Pt would like to come in tomorrow at 10:30 for a rash under her breast and would like a call back on this  [phone]

## 2015-09-21 NOTE — Telephone Encounter (Signed)
Can you schedule this patient?

## 2015-09-22 ENCOUNTER — Telehealth: Payer: Self-pay

## 2015-09-22 ENCOUNTER — Ambulatory Visit (INDEPENDENT_AMBULATORY_CARE_PROVIDER_SITE_OTHER): Payer: Medicare Other | Admitting: Family Medicine

## 2015-09-22 ENCOUNTER — Other Ambulatory Visit: Payer: Self-pay | Admitting: Family Medicine

## 2015-09-22 VITALS — BP 140/90 | HR 101 | Temp 98.4°F | Ht 63.0 in | Wt 194.0 lb

## 2015-09-22 DIAGNOSIS — B372 Candidiasis of skin and nail: Secondary | ICD-10-CM

## 2015-09-22 MED ORDER — KETOCONAZOLE 2 % EX CREA: 1.0000 "application " | TOPICAL_CREAM | Freq: Two times a day (BID) | CUTANEOUS | Status: DC | PRN

## 2015-09-22 MED ORDER — NAFTIFINE HCL 1 % EX CREA: TOPICAL_CREAM | Freq: Every day | CUTANEOUS | Status: DC

## 2015-09-22 NOTE — Telephone Encounter (Signed)
We changed this to Ketoconazole.  We knew cost may be an issue.

## 2015-09-22 NOTE — Patient Instructions (Signed)
Cutaneous Candidiasis °Cutaneous candidiasis is a condition in which there is an overgrowth of yeast (candida) on the skin. Yeast normally live on the skin, but in small enough numbers not to cause any symptoms. In certain cases, increased growth of the yeast may cause an actual yeast infection. This kind of infection usually occurs in areas of the skin that are constantly warm and moist, such as the armpits or the groin. Yeast is the most common cause of diaper rash in babies and in people who cannot control their bowel movements (incontinence). °CAUSES  °The fungus that most often causes cutaneous candidiasis is Candida albicans. Conditions that can increase the risk of getting a yeast infection of the skin include: °· Obesity. °· Pregnancy. °· Diabetes. °· Taking antibiotic medicine. °· Taking birth control pills. °· Taking steroid medicines. °· Thyroid disease. °· An iron or zinc deficiency. °· Problems with the immune system. °SYMPTOMS  °· Red, swollen area of the skin. °· Bumps on the skin. °· Itchiness. °DIAGNOSIS  °The diagnosis of cutaneous candidiasis is usually based on its appearance. Light scrapings of the skin may also be taken and viewed under a microscope to identify the presence of yeast. °TREATMENT  °Antifungal creams may be applied to the infected skin. In severe cases, oral medicines may be needed.  °HOME CARE INSTRUCTIONS  °· Keep your skin clean and dry. °· Maintain a healthy weight. °· If you have diabetes, keep your blood sugar under control. °SEEK IMMEDIATE MEDICAL CARE IF: °· Your rash continues to spread despite treatment. °· You have a fever, chills, or abdominal pain. °  °This information is not intended to replace advice given to you by your health care provider. Make sure you discuss any questions you have with your health care provider. °  °Document Released: 05/15/2011 Document Revised: 11/19/2011 Document Reviewed: 02/28/2015 °Elsevier Interactive Patient Education ©2016 [employer]  Inc[employer] ° °

## 2015-09-22 NOTE — Telephone Encounter (Signed)
Received a fax from the pharmacy requesting to change Naftin cream (copay $125.00) to clotrimazole or ketoconazole?

## 2015-09-22 NOTE — Progress Notes (Signed)
   Subjective:    Patient ID: Sara Logan, female    DOB: 1933/08/15, 82 y.o.   MRN: [id number]  HPI  acute visit for 2 week history of skin rash under right breast. Rash is pruritic. Denies any left breast rash. No recent antibiotics. No recent prednisone. No history of diabetes. She denies any polyuria or polydipsia. She tried bacitracin without improvement. She is trying to wean off her bra is much as possible. No alleviating factors  Past Medical History  Diagnosis Date  . Arthritis   . Depression   . Hyperlipidemia   . Osteopenia   . Thrombocytopenia ([place])   . Vitamin D deficiency   . Wrist fracture 2002    left   Past Surgical History  Procedure Laterality Date  . Joint replacement  2002    right total   . Carpal tunnel release  2008    right  . Appendectomy  1967    reports that she has never smoked. She does not have any smokeless tobacco history on file. She reports that she does not drink alcohol or use illicit drugs. family history includes Cancer in her mother and sister. No Known Allergies     Review of Systems  Constitutional: Negative for fever and chills.  Skin: Positive for rash.       Objective:   Physical Exam  Constitutional: She appears well-developed and well-nourished.  Cardiovascular: Normal rate and regular rhythm.   Pulmonary/Chest: Effort normal and breath sounds normal. No respiratory distress. She has no wheezes. She has no rales.  Skin: Rash noted.  Patient has erythematous rash with well-demarcated borders underneath the right breast. No left breast involvement. Nontender. No pustules. No vesicles.          Assessment & Plan:  Candida skin rash right breast.  Keep dry as possible.  Naftin cream once daily.  Touch base in 2-3 weeks if not improving.

## 2015-09-22 NOTE — Progress Notes (Signed)
Pre visit review using our clinic review tool, if applicable. No additional management support is needed unless otherwise documented below in the visit note. 

## 2015-09-25 ENCOUNTER — Encounter (HOSPITAL_COMMUNITY): Payer: Self-pay | Admitting: Emergency Medicine

## 2015-09-25 ENCOUNTER — Telehealth: Payer: Self-pay | Admitting: Family Medicine

## 2015-09-25 ENCOUNTER — Inpatient Hospital Stay (HOSPITAL_COMMUNITY)
Admission: EM | Admit: 2015-09-25 | Discharge: 2015-09-27 | DRG: 062 | Disposition: A | Discharge status: Home Health | Payer: Medicare Other | Attending: Neurology | Admitting: Neurology

## 2015-09-25 ENCOUNTER — Emergency Department (HOSPITAL_COMMUNITY): Payer: Medicare Other

## 2015-09-25 DIAGNOSIS — I6789 Other cerebrovascular disease: Secondary | ICD-10-CM | POA: Diagnosis not present

## 2015-09-25 DIAGNOSIS — Z6834 Body mass index (BMI) 34.0-34.9, adult: Secondary | ICD-10-CM

## 2015-09-25 DIAGNOSIS — E785 Hyperlipidemia, unspecified: Secondary | ICD-10-CM | POA: Diagnosis present

## 2015-09-25 DIAGNOSIS — I639 Cerebral infarction, unspecified: Secondary | ICD-10-CM | POA: Diagnosis not present

## 2015-09-25 DIAGNOSIS — F329 Major depressive disorder, single episode, unspecified: Secondary | ICD-10-CM | POA: Diagnosis present

## 2015-09-25 DIAGNOSIS — I63032 Cerebral infarction due to thrombosis of left carotid artery: Secondary | ICD-10-CM | POA: Diagnosis not present

## 2015-09-25 DIAGNOSIS — R202 Paresthesia of skin: Secondary | ICD-10-CM | POA: Diagnosis not present

## 2015-09-25 DIAGNOSIS — I6602 Occlusion and stenosis of left middle cerebral artery: Secondary | ICD-10-CM | POA: Diagnosis not present

## 2015-09-25 DIAGNOSIS — M8580 Other specified disorders of bone density and structure, unspecified site: Secondary | ICD-10-CM | POA: Diagnosis present

## 2015-09-25 DIAGNOSIS — E559 Vitamin D deficiency, unspecified: Secondary | ICD-10-CM | POA: Diagnosis present

## 2015-09-25 DIAGNOSIS — G8191 Hemiplegia, unspecified affecting right dominant side: Secondary | ICD-10-CM | POA: Diagnosis present

## 2015-09-25 DIAGNOSIS — R29810 Facial weakness: Secondary | ICD-10-CM | POA: Diagnosis present

## 2015-09-25 DIAGNOSIS — D696 Thrombocytopenia, unspecified: Secondary | ICD-10-CM | POA: Diagnosis present

## 2015-09-25 DIAGNOSIS — G839 Paralytic syndrome, unspecified: Secondary | ICD-10-CM | POA: Diagnosis not present

## 2015-09-25 DIAGNOSIS — R739 Hyperglycemia, unspecified: Secondary | ICD-10-CM | POA: Diagnosis present

## 2015-09-25 DIAGNOSIS — E669 Obesity, unspecified: Secondary | ICD-10-CM | POA: Diagnosis present

## 2015-09-25 DIAGNOSIS — F339 Major depressive disorder, recurrent, unspecified: Secondary | ICD-10-CM | POA: Diagnosis present

## 2015-09-25 DIAGNOSIS — R531 Weakness: Secondary | ICD-10-CM | POA: Diagnosis not present

## 2015-09-25 LAB — DIFFERENTIAL
Basophils Absolute: 0 10*3/uL (ref 0.0–0.1)
Basophils Relative: 0 %
Eosinophils Absolute: 0.2 10*3/uL (ref 0.0–0.7)
Eosinophils Relative: 2 %
Lymphocytes Relative: 21 %
Lymphs Abs: 1.6 10*3/uL (ref 0.7–4.0)
Monocytes Absolute: 0.8 10*3/uL (ref 0.1–1.0)
Monocytes Relative: 11 %
Neutro Abs: 5 10*3/uL (ref 1.7–7.7)
Neutrophils Relative %: 66 %

## 2015-09-25 LAB — CBC
HCT: 37.5 % (ref 36.0–46.0)
Hemoglobin: 12.2 g/dL (ref 12.0–15.0)
MCH: 27.9 pg (ref 26.0–34.0)
MCHC: 32.5 g/dL (ref 30.0–36.0)
MCV: 85.8 fL (ref 78.0–100.0)
Platelets: 123 10*3/uL — ABNORMAL LOW (ref 150–400)
RBC: 4.37 MIL/uL (ref 3.87–5.11)
RDW: 14.7 % (ref 11.5–15.5)
WBC: 7.6 10*3/uL (ref 4.0–10.5)

## 2015-09-25 LAB — I-STAT CHEM 8, ED
BUN: 21 mg/dL — ABNORMAL HIGH (ref 6–20)
Calcium, Ion: 1.16 mmol/L (ref 1.13–1.30)
Chloride: 104 mmol/L (ref 101–111)
Creatinine, Ser: 0.7 mg/dL (ref 0.44–1.00)
Glucose, Bld: 114 mg/dL — ABNORMAL HIGH (ref 65–99)
HCT: 40 % (ref 36.0–46.0)
Hemoglobin: 13.6 g/dL (ref 12.0–15.0)
Potassium: 3.5 mmol/L (ref 3.5–5.1)
Sodium: 141 mmol/L (ref 135–145)
TCO2: 24 mmol/L (ref 0–100)

## 2015-09-25 LAB — COMPREHENSIVE METABOLIC PANEL
ALT: 16 U/L (ref 14–54)
AST: 20 U/L (ref 15–41)
Albumin: 3.4 g/dL — ABNORMAL LOW (ref 3.5–5.0)
Alkaline Phosphatase: 65 U/L (ref 38–126)
Anion gap: 12 (ref 5–15)
BUN: 18 mg/dL (ref 6–20)
CO2: 24 mmol/L (ref 22–32)
Calcium: 9.5 mg/dL (ref 8.9–10.3)
Chloride: 104 mmol/L (ref 101–111)
Creatinine, Ser: 0.75 mg/dL (ref 0.44–1.00)
GFR calc Af Amer: >60 mL/min (ref >60)
GFR calc non Af Amer: >60 mL/min (ref >60)
Glucose, Bld: 117 mg/dL — ABNORMAL HIGH (ref 65–99)
Potassium: 3.6 mmol/L (ref 3.5–5.1)
Sodium: 140 mmol/L (ref 135–145)
Total Bilirubin: 0.3 mg/dL (ref 0.3–1.2)
Total Protein: 6.4 g/dL — ABNORMAL LOW (ref 6.5–8.1)

## 2015-09-25 LAB — I-STAT TROPONIN, ED: Troponin i, poc: 0 ng/mL (ref 0.00–0.08)

## 2015-09-25 LAB — CBG MONITORING, ED: Glucose-Capillary: 122 mg/dL — ABNORMAL HIGH (ref 65–99)

## 2015-09-25 LAB — PROTIME-INR
INR: 1.06 (ref 0.00–1.49)
Prothrombin Time: 14 seconds (ref 11.6–15.2)

## 2015-09-25 LAB — GLUCOSE, CAPILLARY: Glucose-Capillary: 139 mg/dL — ABNORMAL HIGH (ref 65–99)

## 2015-09-25 LAB — MRSA PCR SCREENING: MRSA by PCR: NEGATIVE

## 2015-09-25 LAB — APTT: aPTT: 27 seconds (ref 24–37)

## 2015-09-25 MED ORDER — ALTEPLASE (STROKE) FULL DOSE INFUSION
0.9000 mg/kg | Freq: Once | INTRAVENOUS | Status: AC
  Administered 2015-09-25: 79 mg via INTRAVENOUS
  Filled 2015-09-25: qty 100

## 2015-09-25 MED ORDER — STROKE: EARLY STAGES OF RECOVERY BOOK
Freq: Once | Status: AC
  Administered 2015-09-25: 18:00:00
  Filled 2015-09-25: qty 1

## 2015-09-25 MED ORDER — ACETAMINOPHEN 650 MG RE SUPP: 650.0000 mg | RECTAL | Status: DC | PRN

## 2015-09-25 MED ORDER — LABETALOL HCL 5 MG/ML IV SOLN
10.0000 mg | INTRAVENOUS | Status: DC | PRN
  Administered 2015-09-25 – 2015-09-27 (×5): 10 mg via INTRAVENOUS
  Filled 2015-09-25 (×5): qty 4

## 2015-09-25 MED ORDER — PANTOPRAZOLE SODIUM 40 MG IV SOLR
40.0000 mg | Freq: Every day | INTRAVENOUS | Status: DC
  Administered 2015-09-25 – 2015-09-26 (×2): 40 mg via INTRAVENOUS
  Filled 2015-09-25 (×2): qty 40

## 2015-09-25 MED ORDER — ACETAMINOPHEN 325 MG PO TABS
650.0000 mg | ORAL_TABLET | ORAL | Status: DC | PRN
  Administered 2015-09-26 – 2015-09-27 (×2): 650 mg via ORAL
  Filled 2015-09-25 (×2): qty 2

## 2015-09-25 MED ORDER — SODIUM CHLORIDE 0.9 % IV SOLN
INTRAVENOUS | Status: DC
  Administered 2015-09-25: 19:00:00 via INTRAVENOUS

## 2015-09-25 NOTE — ED Provider Notes (Signed)
CSN: [id number]     Arrival date & time 09/25/15  1457 History   First MD Initiated Contact with Patient 09/25/15 1502     Chief Complaint  Patient presents with  . Code Stroke     (Consider location/radiation/quality/duration/timing/severity/associated sxs/prior Treatment) HPI   48 y F w PMH HL, arthritis, who comes in with R sided arm and leg weakness.  Last known normal was 1:30pm.  Patient comes in as a code stroke.   Past Medical History  Diagnosis Date  . Arthritis   . Depression   . Hyperlipidemia   . Osteopenia   . Thrombocytopenia ([place])   . Vitamin D deficiency   . Wrist fracture 2002    left   Past Surgical History  Procedure Laterality Date  . Joint replacement  2002    right total   . Carpal tunnel release  2008    right  . Appendectomy  1967   Family History  Problem Relation Age of Onset  . Cancer Mother     breast  . Cancer Sister     breast   Social History  Substance Use Topics  . Smoking status: Never Smoker   . Smokeless tobacco: None  . Alcohol Use: No   OB History    No data available     Review of Systems  Constitutional: Negative for fever and chills.  HENT: Negative for nosebleeds.   Eyes: Negative for visual disturbance.  Respiratory: Negative for cough and shortness of breath.   Cardiovascular: Negative for chest pain.  Gastrointestinal: Negative for nausea, vomiting, abdominal pain, diarrhea and constipation.  Genitourinary: Negative for dysuria.  Skin: Negative for rash.  Neurological: Positive for weakness.  All other systems reviewed and are negative.     Allergies  Review of patient's allergies indicates no known allergies.  Home Medications   Prior to Admission medications   Medication Sig Start Date End Date Taking? Authorizing Provider  Biotin 10 MG TABS Take by mouth daily.    Historical Provider, MD  Cholecalciferol (VITAMIN D) 2000 UNITS CAPS Take by mouth daily.    Historical Provider, MD  ketoconazole  (NIZORAL) 2 % cream Apply 1 application topically 2 (two) times daily as needed for irritation. Verbally called in to change. 09/22/15   Eulas Post, MD  Probiotic Product (PRO-BIOTIC BLEND PO) Take by mouth daily.    Historical Provider, MD  Specialty Vitamins Products (MAGNESIUM, AMINO ACID CHELATE,) 133 MG tablet Take 1 tablet by mouth daily.    Historical Provider, MD  venlafaxine XR (EFFEXOR-XR) 75 MG 24 hr capsule TAKE 2 CAPSULES (150MG ) BY MOUTH DAILY. 05/09/15   Eulas Post, MD  vitamin C (ASCORBIC ACID) 500 MG tablet Take 500 mg by mouth daily.    Historical Provider, MD  vitamin E 100 UNIT capsule Take 100 Units by mouth daily.    Historical Provider, MD   BP 164/79 mmHg  Pulse 86  Temp(Src) 98 F (36.7 C) (Oral)  Resp 11  Ht 5\' 3"  (1.6 m)  Wt 87.998 kg  BMI 34.37 kg/m2  SpO2 97% Physical Exam  Constitutional: No distress.  HENT:  Head: Normocephalic and atraumatic.  Eyes: EOM are normal. Pupils are equal, round, and reactive to light.  Neck: Normal range of motion. Neck supple.  Cardiovascular: Normal rate and intact distal pulses.   Pulmonary/Chest: No respiratory distress.  Abdominal: Soft. There is no tenderness.  Musculoskeletal: Normal range of motion.  Neurological: She is alert. No cranial  nerve deficit or sensory deficit.  Right leg and arm weakness.   Skin: No rash noted. She is not diaphoretic.  Psychiatric: She has a normal mood and affect.    ED Course  Procedures (including critical care time) Labs Review Labs Reviewed  CBC - Abnormal; Notable for the following:    Platelets 123 (*)    All other components within normal limits  COMPREHENSIVE METABOLIC PANEL - Abnormal; Notable for the following:    Glucose, Bld 117 (*)    Total Protein 6.4 (*)    Albumin 3.4 (*)    All other components within normal limits  GLUCOSE, CAPILLARY - Abnormal; Notable for the following:    Glucose-Capillary 139 (*)    All other components within normal limits   CBG MONITORING, ED - Abnormal; Notable for the following:    Glucose-Capillary 122 (*)    All other components within normal limits  I-STAT CHEM 8, ED - Abnormal; Notable for the following:    BUN 21 (*)    Glucose, Bld 114 (*)    All other components within normal limits  MRSA PCR SCREENING  PROTIME-INR  APTT  DIFFERENTIAL  HEMOGLOBIN A1C  LIPID PANEL  I-STAT TROPOININ, ED    Imaging Review Ct Head Wo Contrast  09/25/2015  CLINICAL DATA:  Right-sided numbness and weakness.  Code stroke. EXAM: CT HEAD WITHOUT CONTRAST TECHNIQUE: Contiguous axial images were obtained from the base of the skull through the vertex without intravenous contrast. COMPARISON:  None. FINDINGS: There is no evidence of acute intracranial hemorrhage, mass lesion, brain edema or extra-axial fluid collection. The ventricles and subarachnoid spaces are mildly prominent for age. There is no CT evidence of acute cortical infarction. There are fairly extensive small vessel ischemic changes within the periventricular white matter, basal ganglia and pons. Intracranial vascular calcifications are noted. There is a small mucous retention cyst inferiorly in the right maxillary sinus. The visualized paranasal sinuses, mastoid air cells and middle ears are otherwise clear. The calvarium is intact. IMPRESSION: 1. No acute intracranial findings demonstrated. No evidence of acute stroke or hemorrhage. 2. Fairly extensive small vessel ischemic changes in the periventricular white matter, basal ganglia and pons. 3. These results were called by telephone at the time of interpretation on 09/25/2015 at 3:16 pm to Dr. Leonel Ramsay, who verbally acknowledged these results. Electronically Signed   By: Richardean Sale M.D.   On: 09/25/2015 15:18   I have personally reviewed and evaluated these images and lab results as part of my medical decision-making.   EKG Interpretation   Date/Time:  Sunday September 25 2015 15:44:54 EST Ventricular Rate:   94 PR Interval:  205 QRS Duration: 140 QT Interval:  389 QTC Calculation: 486 R Axis:   -25 Text Interpretation:  Sinus rhythm Right bundle branch block Inferior  infarct, acute Lateral leads are also involved Sinus rhythm Non-specific  intra-ventricular conduction delay ST-t wave abnormality Abnormal ekg  Confirmed by Carmin Muskrat  MD ([id number]) on 09/25/2015 3:49:43 PM      MDM   Final diagnoses:  Stroke (cerebrum) ([place])  Stroke (cerebrum) ([place])  Stroke (cerebrum) ([place])  Stroke (cerebrum) ([place])    87 y F w PMH HL, arthritis, who comes in with R sided arm and leg weakness. Last known normal around 1:30 pm  Patient was code stroke on arrival and neuro was immediately at bedside.   A:  Intact B:  BL BS C:  Good bp  Patient was quickly taken to the CT  scanner with neuro.  Neuro has determined pt is tpa candidate and tpa will be administered.  tpa ordered by neuro. Standard code stroke labs including cbc/cmp obtained and unremarkable.  inr and trop unremarkable.  Patient was admitted to neuro for further care.     Jarome Matin, MD 09/26/15 [id number]  Carmin Muskrat, MD 09/29/15 [phone]

## 2015-09-25 NOTE — ED Notes (Signed)
At 1330 stood up from eating lunch and felt right foot was heavy; right leg weakness progressed, and right arm felt weak. Called EMS. Noted right arm drift, right leg weakness and right facial asymmetry. Fire dept had BP 240/120; EMS vitals 155/89, 80, 20, 95%. Arrived A&O reporting right side weakness.

## 2015-09-25 NOTE — H&P (Addendum)
Neurology H&P  CC: Right-sided weakness  History is obtained from: Patient  HPI: Sara Logan is a 82 y.o. female with a history of hyperlipidemia who presents with sudden onset right-sided weakness that started not eating lunch. She sat down around 12:30 PM, and sit up at 1:30 PM and noticed that her right side was dragging. EMS was called and she was transported as a code stroke. After arrival here, she was found to have significant right-sided weakness which was felt to [facility] and therefore risks and benefits were discussed and she agreed to proceed with TPA.   LKW: 1:30 PM tpa given?: Yes    ROS: A 14 point ROS was performed and is negative except as noted in the HPI.    Past Medical History  Diagnosis Date  . Arthritis   . Depression   . Hyperlipidemia   . Osteopenia   . Thrombocytopenia ([place])   . Vitamin D deficiency   . Wrist fracture 2002    left     Family History  Problem Relation Age of Onset  . Cancer Mother     breast  . Cancer Sister     breast     Social History:  reports that she has never smoked. She does not have any smokeless tobacco history on file. She reports that she does not drink alcohol or use illicit drugs.   Exam: Current vital signs: BP 177/87 mmHg  Pulse 97  Temp(Src) 98.4 F (36.9 C) (Oral)  Resp 19  Ht 5\' 3"  (1.6 m)  Wt 87.998 kg (194 lb)  BMI 34.37 kg/m2  SpO2 95% Vital signs in last 24 hours: Temp:  [98.4 F (36.9 C)] 98.4 F (36.9 C) (01/15 1518) Pulse Rate:  [97] 97 (01/15 1518) Resp:  [19] 19 (01/15 1518) BP: (177)/(87) 177/87 mmHg (01/15 1518) SpO2:  [95 %] 95 % (01/15 1518) Weight:  [87.998 kg (194 lb)] 87.998 kg (194 lb) (01/15 1518)   Physical Exam  Constitutional: Appears well-developed and well-nourished.  Psych: Affect appropriate to situation Eyes: No scleral injection HENT: No OP obstrucion Head: Normocephalic.  Cardiovascular: Normal rate and regular rhythm.  Respiratory: Effort normal and  breath sounds normal to anterior ascultation GI: Soft.  No distension. There is no tenderness.  Skin: WDI  Neuro: Mental Status: Patient is awake, alert, oriented to person, place, month, year, and situation. Patient is able to give a clear and coherent history. No signs of aphasia or neglect She does have a mild dysarthria Cranial Nerves: II: Visual Fields are full. Pupils are equal, round, and reactive to light.   III,IV, VI: EOMI without ptosis or diploplia.  V: Facial sensation is symmetric to temperature VII: Facial movement is notable for mild right-sided facial droop VIII: hearing is intact to voice X: Uvula elevates symmetrically XI: Shoulder shrug is symmetric. XII: tongue is midline without atrophy or fasciculations.  Motor: Tone is normal. Bulk is normal. 5/5 strength was present on the left, on the right she has 4/5 weakness of both the arm and leg with discoordination of the arm out of proportion to the weakness. Sensory: Sensation is symmetric to light touch and temperature in the arms and legs. Cerebellar: FNF with discoordination of the right arm out of proportion to weakness, proportionate with weakness in the right leg    I have reviewed labs in epic and the results pertinent to this consultation are: CMP-unremarkable CBC mild from cytopenia 123  I have reviewed the images obtained: CT  head-no acute findings  Impression: 82 year old female who presented with right-sided weakness and discoordination and received IV TPA. She will need to be closely watched and is being admitted to the ICU. She will need a stroke workup and likely PT/OT. She did have significant improvement after TPA was running.  Recommendations: 1. HgbA1c, fasting lipid panel 2. MRI, MRA  of the brain without contrast 3. Frequent neuro checks 4. Echocardiogram 5. Carotid dopplers 6. Prophylactic therapy-Antiplatelet med: Aspirin - dose 325mg  PO or 300mg  PR 7. Risk factor modification 8.  Telemetry monitoring 9. PT consult, OT consult, Speech consult   This patient is critically ill and at significant risk of neurological worsening, death and care requires constant monitoring of vital signs, hemodynamics,respiratory and cardiac monitoring, neurological assessment, discussion with family, other specialists and medical decision making of high complexity. I spent 50 minutes of neurocritical care time  in the care of  this patient.  Roland Rack, MD [facility] [phone]  If 7pm- 7am, please page neurology on call as listed in [place]. 09/25/2015  5:19 PM

## 2015-09-25 NOTE — Telephone Encounter (Signed)
Spoke with team health and then pt's residence She developed sudden R sided numbness and weakness-almost falling in the dining room  bp 150/70 and pulse 105 Given permission to send out by ambulance-worrisome for CVA Will route to PCP

## 2015-09-26 ENCOUNTER — Inpatient Hospital Stay (HOSPITAL_COMMUNITY): Payer: Medicare Other

## 2015-09-26 DIAGNOSIS — I6789 Other cerebrovascular disease: Secondary | ICD-10-CM

## 2015-09-26 DIAGNOSIS — I63032 Cerebral infarction due to thrombosis of left carotid artery: Secondary | ICD-10-CM

## 2015-09-26 LAB — LIPID PANEL
Cholesterol: 183 mg/dL (ref 0–200)
HDL: 41 mg/dL (ref >40)
LDL Cholesterol: 122 mg/dL — ABNORMAL HIGH (ref 0–99)
Total CHOL/HDL Ratio: 4.5 RATIO
Triglycerides: 101 mg/dL (ref <150)
VLDL: 20 mg/dL (ref 0–40)

## 2015-09-26 LAB — GLUCOSE, CAPILLARY
Glucose-Capillary: 116 mg/dL — ABNORMAL HIGH (ref 65–99)
Glucose-Capillary: 120 mg/dL — ABNORMAL HIGH (ref 65–99)
Glucose-Capillary: 115 mg/dL — ABNORMAL HIGH (ref 65–99)
Glucose-Capillary: 102 mg/dL — ABNORMAL HIGH (ref 65–99)

## 2015-09-26 MED ORDER — VENLAFAXINE HCL ER 150 MG PO CP24
150.0000 mg | ORAL_CAPSULE | Freq: Every day | ORAL | Status: DC
  Administered 2015-09-27: 150 mg via ORAL
  Filled 2015-09-26 (×2): qty 1

## 2015-09-26 MED ORDER — ASPIRIN EC 325 MG PO TBEC
325.0000 mg | DELAYED_RELEASE_TABLET | Freq: Every day | ORAL | Status: DC
Start: 2015-09-26 — End: 2015-09-27
  Administered 2015-09-26 – 2015-09-27 (×2): 325 mg via ORAL
  Filled 2015-09-26 (×2): qty 1

## 2015-09-26 MED ORDER — WHITE PETROLATUM GEL
Status: AC
  Administered 2015-09-26: 0.2
  Filled 2015-09-26: qty 1

## 2015-09-26 MED ORDER — ATORVASTATIN CALCIUM 10 MG PO TABS
10.0000 mg | ORAL_TABLET | Freq: Every day | ORAL | Status: DC
  Administered 2015-09-26: 10 mg via ORAL
  Filled 2015-09-26: qty 1

## 2015-09-26 NOTE — Progress Notes (Signed)
Utilization review completed. Elysha Daw, RN, BSN. 

## 2015-09-26 NOTE — Progress Notes (Signed)
STROKE TEAM PROGRESS NOTE   HISTORY Sara Logan is a 82 y.o. female with a history of hyperlipidemia who presents with sudden onset right-sided weakness that started not eating lunch. She sat down around 12:30 PM, and got up at 1:30 PM and noticed that her right side was dragging. EMS was called and she was transported as a code stroke. After arrival here, she was found to have significant right-sided weakness which was felt to merit TPA and therefore risks and benefits were discussed and she agreed to proceed with TPA. Her last known well was listed as 1:30 PM on 09/25/2015. She was admitted to the neuro ICU for further evaluation and treatment.   SUBJECTIVE (INTERVAL HISTORY) No one/family is at the bedside.  She is awake and alert, on the telephone. Overall she feels her condition is rapidly improving.    OBJECTIVE Temp:  [97.7 F (36.5 C)-98.7 F (37.1 C)] 98.1 F (36.7 C) (01/16 0800) Pulse Rate:  [80-98] 91 (01/16 0800) Cardiac Rhythm:  [-] Normal sinus rhythm (01/16 0800) Resp:  [11-22] 22 (01/16 0800) BP: (107-195)/(72-109) 174/80 mmHg (01/16 0800) SpO2:  [89 %-99 %] 95 % (01/16 0800) Weight:  [87.998 kg (194 lb)] 87.998 kg (194 lb) (01/15 1518)  CBC:   Recent Labs Lab 09/25/15 1506 09/25/15 1508  WBC 7.6  --   NEUTROABS 5.0  --   HGB 12.2 13.6  HCT 37.5 40.0  MCV 85.8  --   PLT 123*  --     Basic Metabolic Panel:   Recent Labs Lab 09/25/15 1506 09/25/15 1508  NA 140 141  K 3.6 3.5  CL 104 104  CO2 24  --   GLUCOSE 117* 114*  BUN 18 21*  CREATININE 0.75 0.70  CALCIUM 9.5  --     Lipid Panel:     Component Value Date/Time   CHOL 183 09/26/2015 0325   TRIG 101 09/26/2015 0325   HDL 41 09/26/2015 0325   CHOLHDL 4.5 09/26/2015 0325   VLDL 20 09/26/2015 0325   LDLCALC 122* 09/26/2015 0325   HgbA1c:  Lab Results  Component Value Date   HGBA1C 5.9* 04/22/2015   Urine Drug Screen: No results found for: LABOPIA, COCAINSCRNUR, LABBENZ, AMPHETMU,  THCU, LABBARB    IMAGING  Ct Head Wo Contrast  09/25/2015  CLINICAL DATA:  Right-sided numbness and weakness.  Code stroke. EXAM: CT HEAD WITHOUT CONTRAST TECHNIQUE: Contiguous axial images were obtained from the base of the skull through the vertex without intravenous contrast. COMPARISON:  None. FINDINGS: There is no evidence of acute intracranial hemorrhage, mass lesion, brain edema or extra-axial fluid collection. The ventricles and subarachnoid spaces are mildly prominent for age. There is no CT evidence of acute cortical infarction. There are fairly extensive small vessel ischemic changes within the periventricular white matter, basal ganglia and pons. Intracranial vascular calcifications are noted. There is a small mucous retention cyst inferiorly in the right maxillary sinus. The visualized paranasal sinuses, mastoid air cells and middle ears are otherwise clear. The calvarium is intact. IMPRESSION: 1. No acute intracranial findings demonstrated. No evidence of acute stroke or hemorrhage. 2. Fairly extensive small vessel ischemic changes in the periventricular white matter, basal ganglia and pons. 3. These results were called by telephone at the time of interpretation on 09/25/2015 at 3:16 pm to Dr. Leonel Ramsay, who verbally acknowledged these results. Electronically Signed   By: Richardean Sale M.D.   On: 09/25/2015 15:18    PHYSICAL EXAM Pleasant elderly Caucasian lady  currently not in distress. . Afebrile. Head is nontraumatic. Neck is supple without bruit.    Cardiac exam no murmur or gallop. Lungs are clear to auscultation. Distal pulses are well felt. Neurological Exam :  Awake alert oriented x 3 normal speech and language. Pupils are equal reactive. Vision acuity seems adequate. Fundi were not visualized. Visual fields are full to bedside confrontational testing. Extraocular movements are full range without nystagmus. Mild right lower face asymmetry. Tongue midline. No drift. Mild  diminished fine finger movements on right. Orbits left over right upper extremity. Mild right grip weak.. Normal sensation . Normal coordination. ASSESSMENT/PLAN [name] is a 82 y.o. female with history of hyperlipidemia presenting with right sided weakness. She received IV t-PA 09/25/2015 1525.   Stroke:  left brain infarct, workup underway, source unknown at this time  Resultant  Neurologic deficits resolved  MRI  pending   MRA head  pending   MRA neck pending   2D Echo  pending   LDL 122  HgbA1c 5.9 in Aug  SCDs for VTE prophylaxis Diet regular Room service appropriate?: Yes; Fluid consistency:: Thin  No antithrombotic prior to admission, now on No antithrombotic as within 24h of tPA. Add aspirin 325 mg daily for secondary stroke prevention if imaging at 24h neg for hemorrhage  Ongoing aggressive stroke risk factor management  Therapy recommendations:  Pending. Ok to be OOB  Disposition:  pending (from [facility], independent living)  Hypertension  Elevated since arrival  No history of hypertension  Highest blood pressure 195/94  Saline lock IVF  Monitor BP,  Consider antihypertensive  Hyperlipidemia  Home meds:  No statin  LDL 122, goal < 70  Add lipitor 10  Continue statin at discharge  Hyperglycemia  Glucose 117 and 114  No history of diabetes  Other Stroke Risk Factors  Advanced age  Obesity, Body mass index is 34.37 kg/(m^2).   Other Active Problems  Thrombocytopenia, platelets 123  Depression.  Resume effexor  Recent death of her husband.  Hospital day # [place] for Pager information 09/26/2015 9:23 AM  I have personally examined this patient, reviewed notes, independently viewed imaging studies, participated in medical decision making and plan of care. I have made any additions or clarifications directly to the above note. Agree with note above. She presented with right hemiparesis  and received IV tPA and has made significant clinical improvement but remains at risk for neurological worsening, post TPA hemorrhage, recurrent stroke, TIA and needs close neurological monitoring and aggressive blood pressure control. Continue stroke evaluation testing. Check repeat brain imaging later today and aspirin if no bleed  This patient is critically ill and at significant risk of neurological worsening, death and care requires constant monitoring of vital signs, hemodynamics,respiratory and cardiac monitoring, extensive review of multiple databases, frequent neurological assessment, discussion with family, other specialists and medical decision making of high complexity.I have made any additions or clarifications directly to the above note.This critical care time does not reflect procedure time, or teaching time or supervisory time of PA/NP/Med Resident etc but could involve care discussion time.  I spent 30 minutes of neurocritical care time  in the care of  this patient.    Antony Contras, MD Medical Director [facility] Pager: [phone] 09/26/2015 3:03 PM   To contact Stroke Continuity provider, please refer to [email or web address] After hours, contact General Neurology'

## 2015-09-26 NOTE — Progress Notes (Signed)
MD Steward notified that MRI results are in. Orders received for aspirin 325 mg daily with starting dose to be given now.

## 2015-09-26 NOTE — Progress Notes (Signed)
PT Cancellation Note  Patient Details Name: Sara Logan MRN: [id number] DOB: 23-Apr-1933   Cancelled Treatment:    Reason Eval/Treat Not Completed: Patient not medically ready.  Pt currently on strict bedrest post-tpa.  Will need updated activity orders once appropriate for PT and mobility.     Jakhia Buxton, Thornton Papas 09/26/2015, 8:25 AM

## 2015-09-26 NOTE — Progress Notes (Signed)
OT Cancellation Note  Patient Details Name: Sara Logan MRN: [id number] DOB: Feb 16, 1933   Cancelled Treatment:    Reason Eval/Treat Not Completed: Patient not medically ready Pt on strict bedrest. Please update activity orders when appropriate for therapy. Thanks Kiester, OTR/L  [phone] 09/26/2015 09/26/2015, 8:22 AM

## 2015-09-26 NOTE — Progress Notes (Signed)
  Echocardiogram 2D Echocardiogram has been performed.  Jennette Dubin 09/26/2015, 10:32 AM

## 2015-09-26 NOTE — Evaluation (Signed)
Speech Language Pathology Evaluation Patient Details Name: Sara Logan MRN: [id number] DOB: 26-Jul-1933 Today's Date: 09/26/2015 Time: [id number] SLP Time Calculation (min) (ACUTE ONLY): 18 min  Problem List:  Patient Active Problem List   Diagnosis Date Noted  . CVA (cerebral infarction) 09/25/2015  . History of normocytic normochromic anemia 03/18/2015  . Obesity (BMI 30-39.9) 08/07/2013  . Urinary urgency 03/19/2013  . Osteopenia 08/27/2012  . Venous stasis 02/06/2012  . Rosacea 07/05/2011  . Vitamin D deficiency 12/08/2010  . HYPERLIPIDEMIA 09/08/2010  . DEPRESSION, RECURRENT 09/08/2010   Past Medical History:  Past Medical History  Diagnosis Date  . Arthritis   . Depression   . Hyperlipidemia   . Osteopenia   . Thrombocytopenia ([place])   . Vitamin D deficiency   . Wrist fracture 2002    left   Past Surgical History:  Past Surgical History  Procedure Laterality Date  . Joint replacement  2002    right total   . Carpal tunnel release  2008    right  . Appendectomy  [id number]   HPI:  82 year old female admitted with right sided weakness. TPA warranted and provided. CT of the head negative. MRI pending.    Assessment / Plan / Recommendation Clinical Impression  Cognistat administered with patient scoring WNL for all areas assessed. Overall, cognitive-linguistic abilities appear at baseline and intact for return to ALF. No skilled SLP f/u indicated.     SLP Assessment  Patient does not need any further Speech Lanaguage Pathology Services    Follow Up Recommendations  None          SLP Evaluation Prior Functioning  Cognitive/Linguistic Baseline: Within functional limits Type of Home: Assisted living   Cognition  Overall Cognitive Status: Within Functional Limits for tasks assessed    Comprehension  Auditory Comprehension Overall Auditory Comprehension: Appears within functional limits for tasks assessed Visual Recognition/Discrimination Discrimination: Not  tested Reading Comprehension Reading Status: Within funtional limits    Expression Expression Primary Mode of Expression: Verbal Verbal Expression Overall Verbal Expression: Appears within functional limits for tasks assessed   Oral / Motor  Motor Speech Overall Motor Speech: Appears within functional limits for tasks assessed   GO            Gabriel Rainwater MA, CCC-SLP [phone]         Cleotis Sparr Meryl 09/26/2015, 12:00 PM

## 2015-09-26 NOTE — Evaluation (Signed)
Physical Therapy Evaluation Patient Details Name: Sara Logan MRN: [id number] DOB: 08/13/1933 Today's Date: 09/26/2015   History of Present Illness  82 year old female admitted with right sided weakness. TPA warranted and provided. CT of the head negative. MRI pending.    Clinical Impression  Patient presents with mild balance deficits limiting patient's independence with mobility and gait.  Anticipate patient will progress steadily.  Patient may need cane or RW for gait to be able to return to ALF.  Also, will need HHPT to reach maximum potential and independence.  Patient will need continued PT to address balance and mobility.      Follow Up Recommendations Home health PT    Equipment Recommendations   (tbd)    Recommendations for Other Services       Precautions / Restrictions Precautions Precautions: Fall      Mobility  Bed Mobility Overal bed mobility: Modified Independent             General bed mobility comments: used railing  Transfers Overall transfer level: Needs assistance   Transfers: Sit to/from Stand Sit to Stand: Min guard         General transfer comment: min guard for balance  Ambulation/Gait Ambulation/Gait assistance: Min assist Ambulation Distance (Feet): 80 Feet Assistive device: 1 person hand held assist Gait Pattern/deviations: Step-through pattern Gait velocity: decreased   General Gait Details: loss of balance to right x 2, requiring min assist to regain.    Stairs            Wheelchair Mobility    Modified Rankin (Stroke Patients Only) Modified Rankin (Stroke Patients Only) Pre-Morbid Rankin Score: No symptoms Modified Rankin: Moderately severe disability     Balance Overall balance assessment: Needs assistance Sitting-balance support: No upper extremity supported Sitting balance-Leahy Scale: Good     Standing balance support: No upper extremity supported Standing balance-Leahy Scale: Fair Standing balance  comment: unable to tolerate perturbations in standing; tendency to lose balance posteriorly                             Pertinent Vitals/Pain Pain Assessment: No/denies pain    Home Living Family/patient expects to be discharged to:: Assisted living     Type of Home: Assisted living ([facility])         Home Equipment: None Additional Comments: in independent living apt    Prior Function Level of Independence: Independent               Hand Dominance        Extremity/Trunk Assessment               Lower Extremity Assessment: Overall WFL for tasks assessed      Cervical / Trunk Assessment: Normal  Communication   Communication: No difficulties  Cognition Arousal/Alertness: Awake/alert Behavior During Therapy: WFL for tasks assessed/performed Overall Cognitive Status: Within Functional Limits for tasks assessed                      General Comments      Exercises General Exercises - Lower Extremity Ankle Circles/Pumps: AROM;Both;10 reps;Seated      Assessment/Plan    PT Assessment Patient needs continued PT services  PT Diagnosis Generalized weakness   PT Problem List Decreased activity tolerance;Decreased balance;Decreased mobility;Decreased knowledge of use of DME  PT Treatment Interventions DME instruction;Gait training;Functional mobility training;Therapeutic activities;Balance training;Patient/family education;Therapeutic exercise   PT Goals (  Current goals can be found in the Care Plan section) Acute Rehab PT Goals Patient Stated Goal: go back home PT Goal Formulation: With patient Time For Goal Achievement: 09/30/15 Potential to Achieve Goals: Good    Frequency Min 4X/week   Barriers to discharge Decreased caregiver support      Co-evaluation               End of Session   Activity Tolerance: Patient tolerated treatment well Patient left: in chair;with family/visitor present;with call bell/phone  within reach Nurse Communication: Mobility status         Time: 1400-1414 PT Time Calculation (min) (ACUTE ONLY): 14 min   Charges:   PT Evaluation $PT Eval Moderate Complexity: 1 Procedure     PT G CodesShanna Cisco 09/26/2015, 2:27 PM

## 2015-09-27 DIAGNOSIS — I639 Cerebral infarction, unspecified: Principal | ICD-10-CM

## 2015-09-27 LAB — HEMOGLOBIN A1C
Hgb A1c MFr Bld: 5.9 % — ABNORMAL HIGH (ref 4.8–5.6)
Mean Plasma Glucose: 123 mg/dL

## 2015-09-27 LAB — GLUCOSE, CAPILLARY
Glucose-Capillary: 100 mg/dL — ABNORMAL HIGH (ref 65–99)
Glucose-Capillary: 117 mg/dL — ABNORMAL HIGH (ref 65–99)

## 2015-09-27 MED ORDER — ATORVASTATIN CALCIUM 10 MG PO TABS: 10.0000 mg | ORAL_TABLET | Freq: Every day | ORAL | Status: DC

## 2015-09-27 MED ORDER — ASPIRIN 325 MG PO TBEC: 325.0000 mg | DELAYED_RELEASE_TABLET | Freq: Every day | ORAL | Status: DC

## 2015-09-27 NOTE — Progress Notes (Signed)
Patient ready for Discharge today per  Burnetta Sabin NP, social worker paged to facilitated discharge to [facility].

## 2015-09-27 NOTE — Progress Notes (Signed)
Occupational Therapy Evaluation Patient Details Name: Sara Logan MRN: [id number] DOB: 1932/11/09 Today's Date: 09/27/2015    History of Present Illness 82 year old female admitted with right sided weakness. TPA warranted and provided. CT of the head negative. MRI pending.     Clinical Impression   PTA, pt lived at [facility] in [place] and was independent with ADL and mobility, and also drove. Pt completing basic ADL safely and is appropriate to D/C home. Pt will benefit form HHOT to maximize functional level of independence with IADL tasks. All further OT can be addressed by [place] [facility].     Follow Up Recommendations  Home health OT    Equipment Recommendations  Tub/shower seat (pt will purchase after D/C)    Recommendations for Other Services       Precautions / Restrictions Precautions Precautions: Fall      Mobility Bed Mobility Overal bed mobility: Modified Independent             General bed mobility comments: used railing  Transfers Overall transfer level: Needs assistance     Sit to Stand: Supervision         General transfer comment: using cane    Balance     Sitting balance-Leahy Scale: Good       Standing balance-Leahy Scale: Fair (with [place] of cae)                              ADL Overall ADL's : Needs assistance/impaired     Grooming: Set up   Upper Body Bathing: Set up   Lower Body Bathing: Set up   Upper Body Dressing : Set up   Lower Body Dressing: Set up   Toilet Transfer: Min guard   Toileting- Clothing Manipulation and Hygiene: Supervision/safety       Functional mobility during ADLs: Supervision/safety General ADL Comments: Pt able to complete basic ADL safely. Educated pt home safety adn reducing risk of falls.      Vision     Perception     Praxis      Pertinent Vitals/Pain Pain Assessment: No/denies pain     Hand Dominance Right   Extremity/Trunk Assessment Upper Extremity  Assessment Upper Extremity Assessment: RUE deficits/detail RUE Deficits / Details: strength overall WFL. Pt demosntrates higher level sensorimotor deficits.    Lower Extremity Assessment Lower Extremity Assessment: Defer to PT evaluation   Cervical / Trunk Assessment Cervical / Trunk Assessment: Normal   Communication Communication Communication: No difficulties   Cognition Arousal/Alertness: Awake/alert Behavior During Therapy: WFL for tasks assessed/performed Overall Cognitive Status: Within Functional Limits for tasks assessed                     General Comments       Exercises       Shoulder Instructions      Home Living Family/patient expects to be discharged to:: Assisted living (independent Living at [facility]     Type of Home: Independent living facility ([facility]; tub shower; no equipment; grab bars @ toilet)                       Home Equipment: None   Additional Comments: in independent living apt      Prior Functioning/Environment Level of Independence: Independent        Comments: walked to dining room 2x/day if needed. does own medication mgnt  OT Diagnosis: Generalized weakness   OT Problem List: Decreased strength;Decreased coordination   OT Treatment/Interventions:      OT Goals(Current goals can be found in the care plan section) Acute Rehab OT Goals Patient Stated Goal: go back home OT Goal Formulation: With patient Time For Goal Achievement: 10/11/15 Potential to Achieve Goals: Good  OT Frequency:     Barriers to D/C:            Co-evaluation              End of Session Equipment Utilized During Treatment: Gait belt;Other (comment) (cane) Nurse Communication: Mobility status  Activity Tolerance: Patient tolerated treatment well Patient left: in chair;with call bell/phone within reach   Time: [id number] OT Time Calculation (min): 36 min Charges:  OT General Charges $OT Visit: 1  Procedure OT Evaluation $OT Eval Moderate Complexity: 1 Procedure OT Treatments $Self Care/Home Management : 8-22 mins G-Codes:    Tobi Groesbeck,HILLARY October 17, 2015, 3:38 PM   [facility], OTR/L  [phone] 10/17/2015

## 2015-09-27 NOTE — Care Management Note (Signed)
Case Management Note  Patient Details  Name: JAYCEE LUAN MRN: [id number] Date of Birth: August 17, 1933  Subjective/Objective:     Pt admitted on 09/25/15 s/p stroke with IV TPA given.  PTA, pt resided at [facility] independent living facility.  Pt's husband recently died, and she now lives alone, but has friends and staff to assist her.                 Action/Plan: PT recommending HHPT at dc.  [facility] has their own home health agency in house, [facility] needs.  Left message for [facility] agency staff; will fax referral upon callback.    Expected Discharge Date:    09/27/15              Expected Discharge Plan:   Home with [place]  In-House Referral:     Discharge planning Services   CM Referral  Post Acute Care Choice:   Home Health Choice offered to:   Patient  DME Arranged:    DME Agency:     HH Arranged:   HHPT [place] Agency:    Other, see comment Status of Service:   Completed  Medicare Important Message Given:    Date Medicare IM Given:    Medicare IM give by:    Date Additional Medicare IM Given:    Additional Medicare Important Message give by:     If discussed at [place] of Stay Meetings, dates discussed:    Additional Comments:  Reinaldo Raddle, RN, BSN  Trauma/Neuro ICU Case Manager [phone]

## 2015-09-27 NOTE — Progress Notes (Addendum)
STROKE TEAM PROGRESS NOTE   SUBJECTIVE (INTERVAL HISTORY) Patient up in chair. [place] for discharge, would like to let facility know her plans.   OBJECTIVE Temp:  [97.7 F (36.5 C)-98.5 F (36.9 C)] 98.1 F (36.7 C) (01/17 0738) Pulse Rate:  [66-104] 73 (01/17 0800) Cardiac Rhythm:  [-] Normal sinus rhythm (01/17 0800) Resp:  [10-24] 24 (01/17 0800) BP: (140-187)/(71-97) 174/81 mmHg (01/17 0800) SpO2:  [93 %-100 %] 97 % (01/17 0800)  CBC:   Recent Labs Lab 09/25/15 1506 09/25/15 1508  WBC 7.6  --   NEUTROABS 5.0  --   HGB 12.2 13.6  HCT 37.5 40.0  MCV 85.8  --   PLT 123*  --     Basic Metabolic Panel:   Recent Labs Lab 09/25/15 1506 09/25/15 1508  NA 140 141  K 3.6 3.5  CL 104 104  CO2 24  --   GLUCOSE 117* 114*  BUN 18 21*  CREATININE 0.75 0.70  CALCIUM 9.5  --     Lipid Panel:     Component Value Date/Time   CHOL 183 09/26/2015 0325   TRIG 101 09/26/2015 0325   HDL 41 09/26/2015 0325   CHOLHDL 4.5 09/26/2015 0325   VLDL 20 09/26/2015 0325   LDLCALC 122* 09/26/2015 0325   HgbA1c:  Lab Results  Component Value Date   HGBA1C 5.9* 09/26/2015   Urine Drug Screen: No results found for: LABOPIA, COCAINSCRNUR, LABBENZ, AMPHETMU, THCU, LABBARB    IMAGING  Ct Head Wo Contrast 09/25/2015  1. No acute intracranial findings demonstrated. No evidence of acute stroke or hemorrhage. 2. Fairly extensive small vessel ischemic changes in the periventricular white matter, basal ganglia and pons.   MRI HEAD  09/26/2015  No acute infarct. Remote left thalamic infarct. Prominent chronic small vessel disease changes. Global atrophy. Tiny blood breakdown products within the pons most likely related to prior episodes hemorrhagic ischemia otherwise no intracranial hemorrhage.   MRA HEAD  09/26/2015  Anterior circulation without medium or large size vessel significant stenosis or occlusion. Middle cerebral artery branch vessel mild narrowing and irregularity. Minimal bulge  left middle cerebral artery bifurcation may represent a tiny 1.2 mm aneurysm. No significant stenosis distal vertebral arteries or basilar artery. Large left posterior inferior cerebellar artery and right anterior inferior cerebellar artery with nonvisualized right posterior inferior cerebellar artery and left anterior inferior cerebellar artery. Mild narrowing P2 segment left posterior cerebral artery.   MRI NECK  09/26/2015  Common origin innominate and left common carotid artery. Ectatic innominate artery with prominent fold and narrowing. Prominent ectasia proximal common carotid arteries with fold and narrowing bilaterally. No evidence of hemodynamically significant stenosis involving either carotid bifurcation or internal carotid artery. Mild narrowing proximal right vertebral artery. Left vertebral artery is dominant. Ectatic proximal left vertebral artery.   2D Echocardiogram  - Left ventricle: Wall thickness was increasedin a pattern of mildLVH. Systolic function was normal. The estimated ejectionfraction was in the range of 55% to 60%. Wall motion was normal;there were no regional wall motion abnormalities. Dopplerparameters are consistent with abnormal left ventricularrelaxation (grade 1 diastolic dysfunction). - Aortic valve: There was mild to moderate regurgitation. - Mitral valve: Calcified annulus. - Impressions:  No cardiac source of emboli was indentified.   PHYSICAL EXAM Pleasant elderly Caucasian lady currently not in distress. . Afebrile. Head is nontraumatic. Neck is supple without bruit.    Cardiac exam no murmur or gallop. Lungs are clear to auscultation. Distal pulses are well felt. Neurological Exam :  Awake alert oriented x 3 normal speech and language. Pupils are equal reactive. Vision acuity seems adequate. Fundi were not visualized. Visual fields are full to bedside confrontational testing. Extraocular movements are full range without nystagmus. Mild right lower face  asymmetry. Tongue midline. No drift. Mild diminished fine finger movements on right. Orbits left over right upper extremity. Mild right grip weak.. Normal sensation . Normal coordination.   ASSESSMENT/PLAN [name] is a 82 y.o. female with history of hyperlipidemia presenting with right sided weakness. She received IV t-PA 09/25/2015 1525.    Stroke:  aborted left brain infarct s/p IV tPA, workup underway, source small vessel disease  Resultant  Neurologic deficits resolved  MRI  No acute stroke  MRA head  No large vessel occlusion  MRA neck no hemodynamic stenosis  2D Echo  No source of embolus   LDL 122  HgbA1c 5.9 in Aug  SCDs for VTE prophylaxis Diet regular Room service appropriate?: Yes; Fluid consistency:: Thin  No antithrombotic prior to admission, now on aspirin 325 mg daily. Continue at discharge  Ongoing aggressive stroke risk factor management  Therapy recommendations:  HH PT, OT pending  Disposition:  pending (from [facility], independent living)  Discharge today. SW consult in place  Hypertension  Elevated since arrival  No history of hypertension  Highest blood pressure 195/94  Saline lock IVF  Monitor BP,  Consider antihypertensive  Hyperlipidemia  Home meds:  No statin  LDL 122, goal < 70  Added lipitor 10  Continue statin at discharge  Hyperglycemia  Glucose 117 and 114  No history of diabetes  A1c 5.9 in Aug  Other Stroke Risk Factors  Advanced age  Obesity, Body mass index is 34.37 kg/(m^2).   Other Active Problems  Thrombocytopenia, platelets 123  Depression.  Resume effexor  Recent death of her husband.  Hospital day # [place] for Pager information 09/27/2015 9:12 AM  I have personally examined this patient, reviewed notes, independently viewed imaging studies, participated in medical decision making and plan of care. I have made any additions or clarifications  directly to the above note. Agree with note above.    Antony Contras, MD Medical Director [facility] Pager: [phone] 09/27/2015 3:56 PM    To contact Stroke Continuity provider, please refer to [email or web address] After hours, contact General Neurology'

## 2015-09-27 NOTE — Progress Notes (Signed)
Physical Therapy Treatment Patient Details Name: Sara Logan MRN: [id number] DOB: Apr 29, 1933 Today's Date: 09/27/2015    History of Present Illness 82 year old female admitted with right sided weakness. TPA warranted and provided. CT of the head negative. MRI pending.      PT Comments    Pt moving well and anticipate will continue to progress back to baseline level of function.  Pt resides a Independent Living at [facility] and states if she needs she can have increased A at home.  Will continue to follow if remains on acute.    Follow Up Recommendations  Home health PT     Equipment Recommendations  Cane    Recommendations for Other Services       Precautions / Restrictions Precautions Precautions: Fall Restrictions Weight Bearing Restrictions: No    Mobility  Bed Mobility Overal bed mobility: Modified Independent             General bed mobility comments: used railing  Transfers Overall transfer level: Needs assistance Equipment used: Straight cane Transfers: Sit to/from Stand Sit to Stand: Min guard;Supervision         General transfer comment: S for safety  Ambulation/Gait Ambulation/Gait assistance: Min guard Ambulation Distance (Feet): 150 Feet Assistive device: Straight cane Gait Pattern/deviations: Step-through pattern;Decreased stride length     General Gait Details: No LOB noted and pt doing well to coordinate the cane with ambulation.  Discussed use of cane vs no AD as pt D/C to home.     Stairs            Wheelchair Mobility    Modified Rankin (Stroke Patients Only) Modified Rankin (Stroke Patients Only) Pre-Morbid Rankin Score: No symptoms Modified Rankin: Moderately severe disability     Balance Overall balance assessment: Needs assistance Sitting-balance support: No upper extremity supported;Feet supported Sitting balance-Leahy Scale: Good     Standing balance support: No upper extremity supported;During  functional activity Standing balance-Leahy Scale: Fair                      Cognition Arousal/Alertness: Awake/alert Behavior During Therapy: WFL for tasks assessed/performed Overall Cognitive Status: Within Functional Limits for tasks assessed                      Exercises      General Comments        Pertinent Vitals/Pain Pain Assessment: No/denies pain    Home Living Family/patient expects to be discharged to:: Assisted living (independent Living at [facility])     Type of Home: Independent living facility ([facility]; tub shower; no equipment; grab bars @ toilet)       Home Equipment: None Additional Comments: in independent living apt    Prior Function Level of Independence: Independent      Comments: walked to dining room 2x/day if needed. does own medication mgnt   PT Goals (current goals can now be found in the care plan section) Acute Rehab PT Goals Patient Stated Goal: go back home PT Goal Formulation: With patient Time For Goal Achievement: 09/30/15 Potential to Achieve Goals: Good Progress towards PT goals: Progressing toward goals    Frequency  Min 4X/week    PT Plan Current plan remains appropriate    Co-evaluation             End of Session   Activity Tolerance: Patient tolerated treatment well Patient left: in chair;with call bell/phone within reach  Time: [id number] PT Time Calculation (min) (ACUTE ONLY): 22 min  Charges:  $Gait Training: 8-22 mins                    G CodesCatarina Hartshorn, [place] 09/27/2015, 10:09 AM

## 2015-09-27 NOTE — Progress Notes (Signed)
Discharge instructions given and patient verbalized understanding. 

## 2015-09-27 NOTE — Progress Notes (Signed)
OT Cancellation Note  Patient Details Name: Sara Logan MRN: [id number] DOB: 09-Nov-1932   Cancelled Treatment:    Reason Eval/Treat Not Completed: Other (comment) Pt eating breakfast. Asked for therapist to return. [place], OTR/L  [id number] 09/27/2015 09/27/2015, 8:13 AM

## 2015-09-27 NOTE — Discharge Summary (Signed)
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  Past Medical History  Diagnosis Date  . Arthritis   . Depression   . Hyperlipidemia   . Osteopenia   . Thrombocytopenia ([place])   . Vitamin D deficiency   . Wrist fracture 2002    left   Past Surgical History  Procedure Laterality Date  . Joint replacement  2002    right total   . Carpal tunnel release  2008    right  . Appendectomy  1967      Medication List    TAKE these medications        aspirin 325 MG EC tablet  Take 1 tablet (325 mg total) by mouth daily.     atorvastatin 10 MG tablet  Commonly known as:  LIPITOR  Take 1 tablet (10 mg total) by mouth daily at 6 PM.     Biotin 10 MG Tabs  Take 10 mg by mouth daily.     ketoconazole 2 % cream  Commonly known as:  NIZORAL  Apply 1 application topically 2 (two) times daily as needed for irritation. Verbally called in to change.     magnesium (amino acid chelate) 133 MG tablet  Take 1 tablet by mouth daily.     PRO-BIOTIC BLEND PO  Take 1 tablet by mouth daily.     venlafaxine XR 75 MG 24 hr capsule  Commonly known as:  EFFEXOR-XR  TAKE 2 CAPSULES (150MG ) BY MOUTH DAILY.     vitamin C 500 MG tablet  Commonly known as:  ASCORBIC ACID  Take 500 mg by mouth daily.     Vitamin D 2000 units Caps  Take 2,000 Units by mouth daily.     vitamin E 100 UNIT capsule  Take 100 Units by mouth daily.        LABORATORY STUDIES CBC    Component Value Date/Time   WBC 7.6 09/25/2015 1506   RBC 4.37 09/25/2015 1506   HGB 13.6 09/25/2015 1508   HCT 40.0  09/25/2015 1508   PLT 123* 09/25/2015 1506   MCV 85.8 09/25/2015 1506   MCH 27.9 09/25/2015 1506   MCHC 32.5 09/25/2015 1506   RDW 14.7 09/25/2015 1506   LYMPHSABS 1.6 09/25/2015 1506   MONOABS 0.8 09/25/2015 1506   EOSABS 0.2 09/25/2015 1506   BASOSABS 0.0 09/25/2015 1506   CMP    Component Value Date/Time   NA 141 09/25/2015 1508   K 3.5 09/25/2015 1508   CL 104 09/25/2015 1508   CO2 24 09/25/2015 1506   GLUCOSE 114* 09/25/2015 1508   BUN 21* 09/25/2015 1508   CREATININE 0.70 09/25/2015 1508   CALCIUM 9.5 09/25/2015 1506   PROT 6.4* 09/25/2015 1506   ALBUMIN 3.4* 09/25/2015 1506   AST 20 09/25/2015 1506   ALT 16 09/25/2015 1506   ALKPHOS 65 09/25/2015 1506   BILITOT 0.3 09/25/2015 1506   GFRNONAA >60 09/25/2015 1506   GFRAA >60 09/25/2015 1506   COAGS Lab Results  Component Value Date  INR 1.06 09/25/2015   Lipid Panel    Component Value Date/Time   CHOL 183 09/26/2015 0325   TRIG 101 09/26/2015 0325   HDL 41 09/26/2015 0325   CHOLHDL 4.5 09/26/2015 0325   VLDL 20 09/26/2015 0325   LDLCALC 122* 09/26/2015 0325   HgbA1C  Lab Results  Component Value Date   HGBA1C 5.9* 09/26/2015   Cardiac Panel (last 3 results) No results for input(s): CKTOTAL, CKMB, TROPONINI, RELINDX in the last 72 hours. Urinalysis    Component Value Date/Time   COLORURINE YELLOW 04/23/2014 [place] 04/23/2014 1207   LABSPEC 1.020 04/23/2014 1207   PHURINE 6.0 04/23/2014 1207   GLUCOSEU NEGATIVE 04/23/2014 1207   HGBUR NEGATIVE 04/23/2014 1207   BILIRUBINUR neg 05/20/2015 1638   BILIRUBINUR NEGATIVE 04/23/2014 1207   KETONESUR NEGATIVE 04/23/2014 1207   PROTEINUR 2+ 05/20/2015 1638   UROBILINOGEN 0.2 05/20/2015 1638   UROBILINOGEN 0.2 04/23/2014 1207   NITRITE pos 05/20/2015 1638   NITRITE NEGATIVE 04/23/2014 1207   LEUKOCYTESUR small (1+)* 05/20/2015 1638   Urine Drug Screen No results found for: LABOPIA, COCAINSCRNUR, LABBENZ, AMPHETMU, THCU, LABBARB   Alcohol Level No results found for: [facility]   SIGNIFICANT DIAGNOSTIC STUDIES Ct Head Wo Contrast 09/25/2015 1. No acute intracranial findings demonstrated. No evidence of acute stroke or hemorrhage. 2. Fairly extensive small vessel ischemic changes in the periventricular white matter, basal ganglia and pons.   MRI HEAD  09/26/2015 No acute infarct. Remote left thalamic infarct. Prominent chronic small vessel disease changes. Global atrophy. Tiny blood breakdown products within the pons most likely related to prior episodes hemorrhagic ischemia otherwise no intracranial hemorrhage.   MRA HEAD  09/26/2015 Anterior circulation without medium or large size vessel significant stenosis or occlusion. Middle cerebral artery branch vessel mild narrowing and irregularity. Minimal bulge left middle cerebral artery bifurcation may represent a tiny 1.2 mm aneurysm. No significant stenosis distal vertebral arteries or basilar artery. Large left posterior inferior cerebellar artery and right anterior inferior cerebellar artery with nonvisualized right posterior inferior cerebellar artery and left anterior inferior cerebellar artery. Mild narrowing P2 segment left posterior cerebral artery.   MRI NECK  09/26/2015 Common origin innominate and left common carotid artery. Ectatic innominate artery with prominent fold and narrowing. Prominent ectasia proximal common carotid arteries with fold and narrowing bilaterally. No evidence of hemodynamically significant stenosis involving either carotid bifurcation or internal carotid artery. Mild narrowing proximal right vertebral artery. Left vertebral artery is dominant. Ectatic proximal left vertebral artery.   2D Echocardiogram  - Left ventricle: Wall thickness was increasedin a pattern of mildLVH. Systolic function was normal. The estimated ejectionfraction was in the range of 55% to 60%. Wall motion was normal;there were no regional wall motion abnormalities.  Dopplerparameters are consistent with abnormal left ventricularrelaxation (grade 1 diastolic dysfunction). - Aortic valve: There was mild to moderate regurgitation. - Mitral valve: Calcified annulus. - Impressions: No cardiac source of emboli was indentified.     HISTORY OF PRESENT ILLNESS Sara Logan is a 82 y.o. female with a history of hyperlipidemia who presents with sudden onset right-sided weakness that started not eating lunch. She sat down around 12:30 PM, and got up at 1:30 PM and noticed that her right side was dragging. EMS was called and she was transported as a code stroke. After arrival here, she was found to have significant right-sided weakness which was felt to merit TPA and therefore risks and benefits were discussed and she agreed to proceed  with TPA. Her last known well was listed as 1:30 PM on 09/25/2015. She was admitted to the neuro ICU for further evaluation and treatment.    HOSPITAL COURSE [name] is a 82 y.o. female with history of hyperlipidemia presenting with right sided weakness. She received IV t-PA 09/25/2015 1525 and was admitted to the ICU. Overnight, she had dramatic improvement, only needing HH therapies.   Aborted left brain stroke s/p IV tPA, likely due to small vessel disease   Resultant Neurologic deficits resolved  MRI No acute stroke  MRA head No large vessel occlusion  MRA neck no hemodynamic stenosis  2D Echo No source of embolus   LDL 122  HgbA1c 5.9 in Aug  No antithrombotic prior to admission, now on aspirin 325 mg daily. Continue at discharge  Ongoing aggressive stroke risk factor management  Therapy recommendations: HH PT & OT, cane  Disposition: return to [facility], independent living with HH vs increase in level of care (from [place] independent living apt.)  Hypertension, resolving  Elevated on arrival with improvement  No history of hypertension  Highest blood pressure 195/94  No  antihypertensive at this time  Hyperlipidemia  Home meds: No statin  LDL 122, goal < 70  Added lipitor 10  Continue statin at discharge  Hyperglycemia, resolved  Glucose 117 and 114  No history of diabetes  A1c 5.9 in Aug  Other Stroke Risk Factors  Advanced age  Obesity, Body mass index is 34.37 kg/(m^2).  Other Active Problems  Thrombocytopenia, platelets 123  Depression. Resume effexor  Recent death of her husband.   DISCHARGE EXAM Blood pressure 155/73, pulse 73, temperature 98.5 F (36.9 C), temperature source Oral, resp. rate 20, height 5\' 3"  (1.6 m), weight 87.998 kg (194 lb), SpO2 100 %. Pleasant elderly Caucasian lady currently not in distress. . Afebrile. Head is nontraumatic. Neck is supple without bruit. Cardiac exam no murmur or gallop. Lungs are clear to auscultation. Distal pulses are well felt. Neurological Exam :  Awake alert oriented x 3 normal speech and language. Pupils are equal reactive. Vision acuity seems adequate. Fundi were not visualized. Visual fields are full to bedside confrontational testing. Extraocular movements are full range without nystagmus. Mild right lower face asymmetry. Tongue midline. No drift. Mild diminished fine finger movements on right. Orbits left over right upper extremity. Mild right grip weak.. Normal sensation . Normal coordination.   Discharge Diet   Diet regular Room service appropriate?: Yes; Fluid consistency:: Thin liquids  DISCHARGE PLAN  Disposition:  Return to [facility] (independent living with home health vs temporary increase in level of care)  aspirin 325 mg daily for secondary stroke prevention.  Follow-up Eulas Post, MD in 2 weeks.  Follow-up with Dr. Antony Contras, Stroke Clinic in 2 months.  32 minutes were spent preparing discharge.  [place] Iron for Pager information 09/27/2015 1:35 PM

## 2015-09-28 ENCOUNTER — Other Ambulatory Visit: Payer: Self-pay

## 2015-09-28 DIAGNOSIS — Z1231 Encounter for screening mammogram for malignant neoplasm of breast: Secondary | ICD-10-CM

## 2015-09-28 NOTE — Patient Outreach (Signed)
Telephone call to patient at home. After verifying patient's identity, patient gave consent for [facility] for [facility] Stroke Transition calls.   Sara Logan  [facility] Care Management Assistant

## 2015-09-28 NOTE — Care Management Note (Signed)
Case Management Note  Patient Details  Name: Sara Logan MRN: [id number] Date of Birth: May 17, 1933  Subjective/Objective:                    Action/Plan:   Expected Discharge Date:                  Expected Discharge Plan:  [place]  In-House Referral:     Discharge planning Services  CM Consult  Post Acute Care Choice:  Home Health Choice offered to:  Patient  DME Arranged:    DME Agency:     HH Arranged:  PT, OT HH Agency:     Status of Service:  Completed, signed off  Medicare Important Message Given:    Date Medicare IM Given:    Medicare IM give by:    Date Additional Medicare IM Given:    Additional Medicare Important Message give by:     If discussed at [place] of Stay Meetings, dates discussed:    Additional Comments:  Faxed referral for home PT/OT to [facility], fax# [phone].  JWA  Ella Bodo, RN 09/28/2015, 1:38 PM

## 2015-10-03 DIAGNOSIS — M6281 Muscle weakness (generalized): Secondary | ICD-10-CM | POA: Diagnosis not present

## 2015-10-03 DIAGNOSIS — R0600 Dyspnea, unspecified: Secondary | ICD-10-CM | POA: Diagnosis not present

## 2015-10-03 DIAGNOSIS — R32 Unspecified urinary incontinence: Secondary | ICD-10-CM | POA: Diagnosis not present

## 2015-10-03 DIAGNOSIS — R29898 Other symptoms and signs involving the musculoskeletal system: Secondary | ICD-10-CM | POA: Diagnosis not present

## 2015-10-03 DIAGNOSIS — N3946 Mixed incontinence: Secondary | ICD-10-CM | POA: Diagnosis not present

## 2015-10-03 DIAGNOSIS — R2681 Unsteadiness on feet: Secondary | ICD-10-CM | POA: Diagnosis not present

## 2015-10-03 DIAGNOSIS — R29818 Other symptoms and signs involving the nervous system: Secondary | ICD-10-CM | POA: Diagnosis not present

## 2015-10-05 DIAGNOSIS — R2681 Unsteadiness on feet: Secondary | ICD-10-CM | POA: Diagnosis not present

## 2015-10-05 DIAGNOSIS — R32 Unspecified urinary incontinence: Secondary | ICD-10-CM | POA: Diagnosis not present

## 2015-10-05 DIAGNOSIS — M6281 Muscle weakness (generalized): Secondary | ICD-10-CM | POA: Diagnosis not present

## 2015-10-05 DIAGNOSIS — R0600 Dyspnea, unspecified: Secondary | ICD-10-CM | POA: Diagnosis not present

## 2015-10-05 DIAGNOSIS — R29818 Other symptoms and signs involving the nervous system: Secondary | ICD-10-CM | POA: Diagnosis not present

## 2015-10-05 DIAGNOSIS — R29898 Other symptoms and signs involving the musculoskeletal system: Secondary | ICD-10-CM | POA: Diagnosis not present

## 2015-10-11 DIAGNOSIS — R2681 Unsteadiness on feet: Secondary | ICD-10-CM | POA: Diagnosis not present

## 2015-10-11 DIAGNOSIS — R0600 Dyspnea, unspecified: Secondary | ICD-10-CM | POA: Diagnosis not present

## 2015-10-11 DIAGNOSIS — R29898 Other symptoms and signs involving the musculoskeletal system: Secondary | ICD-10-CM | POA: Diagnosis not present

## 2015-10-11 DIAGNOSIS — R29818 Other symptoms and signs involving the nervous system: Secondary | ICD-10-CM | POA: Diagnosis not present

## 2015-10-11 DIAGNOSIS — R32 Unspecified urinary incontinence: Secondary | ICD-10-CM | POA: Diagnosis not present

## 2015-10-11 DIAGNOSIS — M6281 Muscle weakness (generalized): Secondary | ICD-10-CM | POA: Diagnosis not present

## 2015-10-12 ENCOUNTER — Ambulatory Visit (INDEPENDENT_AMBULATORY_CARE_PROVIDER_SITE_OTHER): Payer: Medicare Other | Admitting: Family Medicine

## 2015-10-12 ENCOUNTER — Encounter: Payer: Self-pay | Admitting: Family Medicine

## 2015-10-12 VITALS — BP 140/80 | HR 99 | Temp 98.7°F | Ht 63.0 in | Wt 188.5 lb

## 2015-10-12 DIAGNOSIS — E785 Hyperlipidemia, unspecified: Secondary | ICD-10-CM

## 2015-10-12 DIAGNOSIS — I63032 Cerebral infarction due to thrombosis of left carotid artery: Secondary | ICD-10-CM

## 2015-10-12 DIAGNOSIS — I639 Cerebral infarction, unspecified: Secondary | ICD-10-CM | POA: Diagnosis not present

## 2015-10-12 NOTE — Patient Instructions (Signed)
Monitor blood pressure at least every other day for the next two weeks If consistently > 140/90 let me know. Continue with aspirin 325 mg daily Continue with daily Lipitor

## 2015-10-12 NOTE — Progress Notes (Signed)
Subjective:    Patient ID: Sara Logan, female    DOB: 02-10-1933, 82 y.o.   MRN: [id number]  HPI  patient is seen following recent hospital admission from 09/25/2015 to 09/27/2015. She was in her usual state of good health until day of admission. She was finished eating lunch and went to get up and noticed that she had some right-sided weakness. EMS was called and she was transported promptly to hospital for further evaluation where she was noted to have right upper and lower extremity weakness. She fell within time period for TPA and this was successful. Her symptoms fully cleared overnight. She had several other studies including MRI no acute stroke. MR angiogram large vessel occlusion. 2-D echocardiogram no source of embolus. Hemoglobin A1c 5.9%. LDL cholesterol 122.   Patient discharged home on aspirin 325 mg daily and Lipitor 10 mg daily. She also has home health physical therapy and occupational therapy. She is currently ambulating without any support and feels back to her baseline. She did have elevated blood pressure initially during hospitalization of 195/94 but improved at discharge. She has never been treated for hypertension. Denies any headaches, swallowing difficulties, slurred speech, or focal weakness.  Past Medical History  Diagnosis Date  . Arthritis   . Depression   . Hyperlipidemia   . Osteopenia   . Thrombocytopenia ([place])   . Vitamin D deficiency   . Wrist fracture 2002    left   Past Surgical History  Procedure Laterality Date  . Joint replacement  2002    right total   . Carpal tunnel release  2008    right  . Appendectomy  1967    reports that she has never smoked. She does not have any smokeless tobacco history on file. She reports that she does not drink alcohol or use illicit drugs. family history includes Cancer in her mother and sister. No Known Allergies    Review of Systems  Constitutional: Negative for chills, fatigue and unexpected weight  change.  Eyes: Negative for visual disturbance.  Respiratory: Negative for cough, chest tightness, shortness of breath and wheezing.   Cardiovascular: Negative for chest pain, palpitations and leg swelling.  Gastrointestinal: Negative for abdominal pain.  Endocrine: Negative for polydipsia and polyuria.  Genitourinary: Negative for dysuria.  Neurological: Negative for dizziness, seizures, syncope, weakness, light-headedness and headaches.  Psychiatric/Behavioral: Negative for confusion.       Objective:   Physical Exam  Constitutional: She is oriented to person, place, and time. She appears well-developed and well-nourished.  Eyes: EOM are normal. Pupils are equal, round, and reactive to light.  Neck: Neck supple.  Cardiovascular: Normal rate and regular rhythm.   Pulmonary/Chest: Effort normal and breath sounds normal. No respiratory distress. She has no wheezes. She has no rales.  Musculoskeletal: She exhibits no edema.  Neurological: She is alert and oriented to person, place, and time. No cranial nerve deficit.  She is able to ambulate without assistance. Full strength upper and lower extremities. Cranial nerves all intact.  Psychiatric: She has a normal mood and affect. Her behavior is normal. Judgment and thought content normal.          Assessment & Plan:  Patient seen following recent aborted left brain stroke status post IV TPA. She's done very well since discharge. No recurrent symptoms. She had mild hyperlipidemia. She is encouraged to continue aspirin one daily and Lipitor 10 mg daily. We put an order for future labs for lipid and hepatic panel in  6 weeks with goal LDL less than 100. Her blood pressure was borderline elevated today and she is encouraged to get this checked at least every other day for next couple weeks and be in touch if consistently greater than 140/90. She has follow-up with neurology in 2 months. Follow-up sooner as needed.  She is seen 15 days after  discharge and thus transitional care code is not use for billing

## 2015-10-13 DIAGNOSIS — R2681 Unsteadiness on feet: Secondary | ICD-10-CM | POA: Diagnosis not present

## 2015-10-13 DIAGNOSIS — R0600 Dyspnea, unspecified: Secondary | ICD-10-CM | POA: Diagnosis not present

## 2015-10-13 DIAGNOSIS — M6281 Muscle weakness (generalized): Secondary | ICD-10-CM | POA: Diagnosis not present

## 2015-10-13 DIAGNOSIS — R29898 Other symptoms and signs involving the musculoskeletal system: Secondary | ICD-10-CM | POA: Diagnosis not present

## 2015-10-13 DIAGNOSIS — R29818 Other symptoms and signs involving the nervous system: Secondary | ICD-10-CM | POA: Diagnosis not present

## 2015-10-13 DIAGNOSIS — N3946 Mixed incontinence: Secondary | ICD-10-CM | POA: Diagnosis not present

## 2015-10-13 DIAGNOSIS — R32 Unspecified urinary incontinence: Secondary | ICD-10-CM | POA: Diagnosis not present

## 2015-10-14 DIAGNOSIS — R0600 Dyspnea, unspecified: Secondary | ICD-10-CM | POA: Diagnosis not present

## 2015-10-14 DIAGNOSIS — R29898 Other symptoms and signs involving the musculoskeletal system: Secondary | ICD-10-CM | POA: Diagnosis not present

## 2015-10-14 DIAGNOSIS — R2681 Unsteadiness on feet: Secondary | ICD-10-CM | POA: Diagnosis not present

## 2015-10-14 DIAGNOSIS — R32 Unspecified urinary incontinence: Secondary | ICD-10-CM | POA: Diagnosis not present

## 2015-10-14 DIAGNOSIS — R29818 Other symptoms and signs involving the nervous system: Secondary | ICD-10-CM | POA: Diagnosis not present

## 2015-10-14 DIAGNOSIS — M6281 Muscle weakness (generalized): Secondary | ICD-10-CM | POA: Diagnosis not present

## 2015-10-17 DIAGNOSIS — R32 Unspecified urinary incontinence: Secondary | ICD-10-CM | POA: Diagnosis not present

## 2015-10-17 DIAGNOSIS — R29818 Other symptoms and signs involving the nervous system: Secondary | ICD-10-CM | POA: Diagnosis not present

## 2015-10-17 DIAGNOSIS — M6281 Muscle weakness (generalized): Secondary | ICD-10-CM | POA: Diagnosis not present

## 2015-10-17 DIAGNOSIS — R29898 Other symptoms and signs involving the musculoskeletal system: Secondary | ICD-10-CM | POA: Diagnosis not present

## 2015-10-17 DIAGNOSIS — R2681 Unsteadiness on feet: Secondary | ICD-10-CM | POA: Diagnosis not present

## 2015-10-17 DIAGNOSIS — R0600 Dyspnea, unspecified: Secondary | ICD-10-CM | POA: Diagnosis not present

## 2015-10-18 DIAGNOSIS — R32 Unspecified urinary incontinence: Secondary | ICD-10-CM | POA: Diagnosis not present

## 2015-10-18 DIAGNOSIS — R29818 Other symptoms and signs involving the nervous system: Secondary | ICD-10-CM | POA: Diagnosis not present

## 2015-10-18 DIAGNOSIS — R2681 Unsteadiness on feet: Secondary | ICD-10-CM | POA: Diagnosis not present

## 2015-10-18 DIAGNOSIS — F341 Dysthymic disorder: Secondary | ICD-10-CM | POA: Diagnosis not present

## 2015-10-18 DIAGNOSIS — R29898 Other symptoms and signs involving the musculoskeletal system: Secondary | ICD-10-CM | POA: Diagnosis not present

## 2015-10-18 DIAGNOSIS — R0600 Dyspnea, unspecified: Secondary | ICD-10-CM | POA: Diagnosis not present

## 2015-10-18 DIAGNOSIS — M6281 Muscle weakness (generalized): Secondary | ICD-10-CM | POA: Diagnosis not present

## 2015-10-19 DIAGNOSIS — R29818 Other symptoms and signs involving the nervous system: Secondary | ICD-10-CM | POA: Diagnosis not present

## 2015-10-19 DIAGNOSIS — R2681 Unsteadiness on feet: Secondary | ICD-10-CM | POA: Diagnosis not present

## 2015-10-19 DIAGNOSIS — R29898 Other symptoms and signs involving the musculoskeletal system: Secondary | ICD-10-CM | POA: Diagnosis not present

## 2015-10-19 DIAGNOSIS — M6281 Muscle weakness (generalized): Secondary | ICD-10-CM | POA: Diagnosis not present

## 2015-10-19 DIAGNOSIS — R32 Unspecified urinary incontinence: Secondary | ICD-10-CM | POA: Diagnosis not present

## 2015-10-19 DIAGNOSIS — R0600 Dyspnea, unspecified: Secondary | ICD-10-CM | POA: Diagnosis not present

## 2015-10-20 DIAGNOSIS — R29898 Other symptoms and signs involving the musculoskeletal system: Secondary | ICD-10-CM | POA: Diagnosis not present

## 2015-10-20 DIAGNOSIS — M6281 Muscle weakness (generalized): Secondary | ICD-10-CM | POA: Diagnosis not present

## 2015-10-20 DIAGNOSIS — R0600 Dyspnea, unspecified: Secondary | ICD-10-CM | POA: Diagnosis not present

## 2015-10-20 DIAGNOSIS — R2681 Unsteadiness on feet: Secondary | ICD-10-CM | POA: Diagnosis not present

## 2015-10-20 DIAGNOSIS — R32 Unspecified urinary incontinence: Secondary | ICD-10-CM | POA: Diagnosis not present

## 2015-10-20 DIAGNOSIS — R29818 Other symptoms and signs involving the nervous system: Secondary | ICD-10-CM | POA: Diagnosis not present

## 2015-10-21 DIAGNOSIS — R29898 Other symptoms and signs involving the musculoskeletal system: Secondary | ICD-10-CM | POA: Diagnosis not present

## 2015-10-21 DIAGNOSIS — M6281 Muscle weakness (generalized): Secondary | ICD-10-CM | POA: Diagnosis not present

## 2015-10-21 DIAGNOSIS — R0600 Dyspnea, unspecified: Secondary | ICD-10-CM | POA: Diagnosis not present

## 2015-10-21 DIAGNOSIS — R29818 Other symptoms and signs involving the nervous system: Secondary | ICD-10-CM | POA: Diagnosis not present

## 2015-10-21 DIAGNOSIS — R2681 Unsteadiness on feet: Secondary | ICD-10-CM | POA: Diagnosis not present

## 2015-10-21 DIAGNOSIS — R32 Unspecified urinary incontinence: Secondary | ICD-10-CM | POA: Diagnosis not present

## 2015-10-25 DIAGNOSIS — R32 Unspecified urinary incontinence: Secondary | ICD-10-CM | POA: Diagnosis not present

## 2015-10-25 DIAGNOSIS — R29818 Other symptoms and signs involving the nervous system: Secondary | ICD-10-CM | POA: Diagnosis not present

## 2015-10-25 DIAGNOSIS — R0600 Dyspnea, unspecified: Secondary | ICD-10-CM | POA: Diagnosis not present

## 2015-10-25 DIAGNOSIS — M6281 Muscle weakness (generalized): Secondary | ICD-10-CM | POA: Diagnosis not present

## 2015-10-25 DIAGNOSIS — R29898 Other symptoms and signs involving the musculoskeletal system: Secondary | ICD-10-CM | POA: Diagnosis not present

## 2015-10-25 DIAGNOSIS — R2681 Unsteadiness on feet: Secondary | ICD-10-CM | POA: Diagnosis not present

## 2015-10-26 DIAGNOSIS — M6281 Muscle weakness (generalized): Secondary | ICD-10-CM | POA: Diagnosis not present

## 2015-10-26 DIAGNOSIS — R29818 Other symptoms and signs involving the nervous system: Secondary | ICD-10-CM | POA: Diagnosis not present

## 2015-10-26 DIAGNOSIS — R0600 Dyspnea, unspecified: Secondary | ICD-10-CM | POA: Diagnosis not present

## 2015-10-26 DIAGNOSIS — R2681 Unsteadiness on feet: Secondary | ICD-10-CM | POA: Diagnosis not present

## 2015-10-26 DIAGNOSIS — R32 Unspecified urinary incontinence: Secondary | ICD-10-CM | POA: Diagnosis not present

## 2015-10-26 DIAGNOSIS — R29898 Other symptoms and signs involving the musculoskeletal system: Secondary | ICD-10-CM | POA: Diagnosis not present

## 2015-10-27 ENCOUNTER — Ambulatory Visit (INDEPENDENT_AMBULATORY_CARE_PROVIDER_SITE_OTHER): Payer: Medicare Other | Admitting: Family Medicine

## 2015-10-27 VITALS — BP 138/88 | HR 102 | Temp 98.3°F | Ht 63.0 in | Wt 189.5 lb

## 2015-10-27 DIAGNOSIS — I10 Essential (primary) hypertension: Secondary | ICD-10-CM | POA: Diagnosis not present

## 2015-10-27 DIAGNOSIS — I63032 Cerebral infarction due to thrombosis of left carotid artery: Secondary | ICD-10-CM | POA: Diagnosis not present

## 2015-10-27 DIAGNOSIS — R2681 Unsteadiness on feet: Secondary | ICD-10-CM | POA: Diagnosis not present

## 2015-10-27 DIAGNOSIS — L989 Disorder of the skin and subcutaneous tissue, unspecified: Secondary | ICD-10-CM

## 2015-10-27 DIAGNOSIS — M6281 Muscle weakness (generalized): Secondary | ICD-10-CM | POA: Diagnosis not present

## 2015-10-27 DIAGNOSIS — I639 Cerebral infarction, unspecified: Secondary | ICD-10-CM | POA: Diagnosis not present

## 2015-10-27 DIAGNOSIS — R29898 Other symptoms and signs involving the musculoskeletal system: Secondary | ICD-10-CM | POA: Diagnosis not present

## 2015-10-27 DIAGNOSIS — R0600 Dyspnea, unspecified: Secondary | ICD-10-CM | POA: Diagnosis not present

## 2015-10-27 DIAGNOSIS — R29818 Other symptoms and signs involving the nervous system: Secondary | ICD-10-CM | POA: Diagnosis not present

## 2015-10-27 DIAGNOSIS — R32 Unspecified urinary incontinence: Secondary | ICD-10-CM | POA: Diagnosis not present

## 2015-10-27 MED ORDER — AMLODIPINE BESYLATE 5 MG PO TABS: 5.0000 mg | ORAL_TABLET | Freq: Every day | ORAL | Status: DC

## 2015-10-27 NOTE — Patient Instructions (Signed)
Leave off bra as much as possible Consider Vaseline to keep friction down in area under breast.

## 2015-10-27 NOTE — Progress Notes (Signed)
Pre visit review using our clinic review tool, if applicable. No additional management support is needed unless otherwise documented below in the visit note. 

## 2015-10-27 NOTE — Progress Notes (Signed)
   Subjective:    Patient ID: Sara Logan, female    DOB: March 23, 1933, 82 y.o.   MRN: [id number]  HPI  patient seen today for the following   Small sore under right breast region.  Recently seen with candida infection under right breast which cleared with nystatin cream  Itching has resolved.  She now has a small linear ulcer near where her lower rubs.  Denies any history trauma. Has not tried anything else topically.   Recent aborted left brain stroke status post IV TPA.  Patient remains on aspirin along with Lipitor. We plan to get follow-up fasting lipid and hepatic panel after she returns from trip out of town over the next couple of weeks. She is not had any other recurrent TIA or other symptoms concerning for stroke.   Never treated for hypertension but recently has had several recent readings around Q000111Q systolic with physical therapy. Denies headache. No focal weakness. No confusion. She is trying to watch her sodium intake closely. No history of diabetes.  Past Medical History  Diagnosis Date  . Arthritis   . Depression   . Hyperlipidemia   . Osteopenia   . Thrombocytopenia ([place])   . Vitamin D deficiency   . Wrist fracture 2002    left   Past Surgical History  Procedure Laterality Date  . Joint replacement  2002    right total   . Carpal tunnel release  2008    right  . Appendectomy  1967    reports that she has never smoked. She does not have any smokeless tobacco history on file. She reports that she does not drink alcohol or use illicit drugs. family history includes Cancer in her mother and sister. No Known Allergies     Review of Systems  Constitutional: Negative for fatigue and unexpected weight change.  Eyes: Negative for visual disturbance.  Respiratory: Negative for cough, chest tightness, shortness of breath and wheezing.   Cardiovascular: Negative for chest pain, palpitations and leg swelling.  Endocrine: Negative for polydipsia and polyuria.    Neurological: Negative for dizziness, seizures, syncope, weakness, light-headedness and headaches.       Objective:   Physical Exam  Constitutional: She appears well-developed and well-nourished.  Eyes: Pupils are equal, round, and reactive to light.  Neck: Neck supple. No JVD present. No thyromegaly present.  Cardiovascular: Normal rate and regular rhythm.  Exam reveals no gallop.   Pulmonary/Chest: Effort normal and breath sounds normal. No respiratory distress. She has no wheezes. She has no rales.  Musculoskeletal: She exhibits no edema.  Neurological: She is alert.  Skin:  She has a very small superficial linear tear in the skin about 3 mm in length under the right breast. Previously noted Candida skin rash has fully resolved. No signs of secondary infection.          Assessment & Plan:  #1 small skin sore under right breast. Probably aggravated by friction with bra. Leave off as much as possible. Try small amount of Vaseline to avoid friction and keep area clean with soap and water. Follow-up for signs of secondary infection  #2 hypertension. Repeat reading both right and left arm seated 160/80. Standing blood pressure left arm 152/78. Recent aborted CVA. She has had multiple readings upper around Q000111Q systolic over the past couple of weeks. Start amlodipine 5 mg daily. Return in couple weeks and recheck blood pressure then  #3 recent aborted left brain CVA with TPA-continue aspirin and Lipitor.

## 2015-10-28 DIAGNOSIS — R0600 Dyspnea, unspecified: Secondary | ICD-10-CM | POA: Diagnosis not present

## 2015-10-28 DIAGNOSIS — R32 Unspecified urinary incontinence: Secondary | ICD-10-CM | POA: Diagnosis not present

## 2015-10-28 DIAGNOSIS — R29818 Other symptoms and signs involving the nervous system: Secondary | ICD-10-CM | POA: Diagnosis not present

## 2015-10-28 DIAGNOSIS — R2681 Unsteadiness on feet: Secondary | ICD-10-CM | POA: Diagnosis not present

## 2015-10-28 DIAGNOSIS — M6281 Muscle weakness (generalized): Secondary | ICD-10-CM | POA: Diagnosis not present

## 2015-10-28 DIAGNOSIS — R29898 Other symptoms and signs involving the musculoskeletal system: Secondary | ICD-10-CM | POA: Diagnosis not present

## 2015-10-31 DIAGNOSIS — R29818 Other symptoms and signs involving the nervous system: Secondary | ICD-10-CM | POA: Diagnosis not present

## 2015-10-31 DIAGNOSIS — R32 Unspecified urinary incontinence: Secondary | ICD-10-CM | POA: Diagnosis not present

## 2015-10-31 DIAGNOSIS — R29898 Other symptoms and signs involving the musculoskeletal system: Secondary | ICD-10-CM | POA: Diagnosis not present

## 2015-10-31 DIAGNOSIS — M6281 Muscle weakness (generalized): Secondary | ICD-10-CM | POA: Diagnosis not present

## 2015-10-31 DIAGNOSIS — R0600 Dyspnea, unspecified: Secondary | ICD-10-CM | POA: Diagnosis not present

## 2015-10-31 DIAGNOSIS — R2681 Unsteadiness on feet: Secondary | ICD-10-CM | POA: Diagnosis not present

## 2015-11-01 DIAGNOSIS — R2681 Unsteadiness on feet: Secondary | ICD-10-CM | POA: Diagnosis not present

## 2015-11-01 DIAGNOSIS — M6281 Muscle weakness (generalized): Secondary | ICD-10-CM | POA: Diagnosis not present

## 2015-11-01 DIAGNOSIS — R29898 Other symptoms and signs involving the musculoskeletal system: Secondary | ICD-10-CM | POA: Diagnosis not present

## 2015-11-01 DIAGNOSIS — R29818 Other symptoms and signs involving the nervous system: Secondary | ICD-10-CM | POA: Diagnosis not present

## 2015-11-01 DIAGNOSIS — R32 Unspecified urinary incontinence: Secondary | ICD-10-CM | POA: Diagnosis not present

## 2015-11-01 DIAGNOSIS — R0600 Dyspnea, unspecified: Secondary | ICD-10-CM | POA: Diagnosis not present

## 2015-11-02 DIAGNOSIS — R29898 Other symptoms and signs involving the musculoskeletal system: Secondary | ICD-10-CM | POA: Diagnosis not present

## 2015-11-02 DIAGNOSIS — R32 Unspecified urinary incontinence: Secondary | ICD-10-CM | POA: Diagnosis not present

## 2015-11-02 DIAGNOSIS — R0600 Dyspnea, unspecified: Secondary | ICD-10-CM | POA: Diagnosis not present

## 2015-11-02 DIAGNOSIS — M6281 Muscle weakness (generalized): Secondary | ICD-10-CM | POA: Diagnosis not present

## 2015-11-02 DIAGNOSIS — F341 Dysthymic disorder: Secondary | ICD-10-CM | POA: Diagnosis not present

## 2015-11-02 DIAGNOSIS — R2681 Unsteadiness on feet: Secondary | ICD-10-CM | POA: Diagnosis not present

## 2015-11-02 DIAGNOSIS — R29818 Other symptoms and signs involving the nervous system: Secondary | ICD-10-CM | POA: Diagnosis not present

## 2015-11-03 ENCOUNTER — Ambulatory Visit: Payer: Medicare Other

## 2015-11-04 DIAGNOSIS — M6281 Muscle weakness (generalized): Secondary | ICD-10-CM | POA: Diagnosis not present

## 2015-11-04 DIAGNOSIS — R29898 Other symptoms and signs involving the musculoskeletal system: Secondary | ICD-10-CM | POA: Diagnosis not present

## 2015-11-04 DIAGNOSIS — R2681 Unsteadiness on feet: Secondary | ICD-10-CM | POA: Diagnosis not present

## 2015-11-04 DIAGNOSIS — R29818 Other symptoms and signs involving the nervous system: Secondary | ICD-10-CM | POA: Diagnosis not present

## 2015-11-04 DIAGNOSIS — R32 Unspecified urinary incontinence: Secondary | ICD-10-CM | POA: Diagnosis not present

## 2015-11-04 DIAGNOSIS — R0600 Dyspnea, unspecified: Secondary | ICD-10-CM | POA: Diagnosis not present

## 2015-11-07 DIAGNOSIS — R0600 Dyspnea, unspecified: Secondary | ICD-10-CM | POA: Diagnosis not present

## 2015-11-07 DIAGNOSIS — R29898 Other symptoms and signs involving the musculoskeletal system: Secondary | ICD-10-CM | POA: Diagnosis not present

## 2015-11-07 DIAGNOSIS — M6281 Muscle weakness (generalized): Secondary | ICD-10-CM | POA: Diagnosis not present

## 2015-11-07 DIAGNOSIS — R32 Unspecified urinary incontinence: Secondary | ICD-10-CM | POA: Diagnosis not present

## 2015-11-07 DIAGNOSIS — R29818 Other symptoms and signs involving the nervous system: Secondary | ICD-10-CM | POA: Diagnosis not present

## 2015-11-07 DIAGNOSIS — R2681 Unsteadiness on feet: Secondary | ICD-10-CM | POA: Diagnosis not present

## 2015-11-08 DIAGNOSIS — R0600 Dyspnea, unspecified: Secondary | ICD-10-CM | POA: Diagnosis not present

## 2015-11-08 DIAGNOSIS — R32 Unspecified urinary incontinence: Secondary | ICD-10-CM | POA: Diagnosis not present

## 2015-11-08 DIAGNOSIS — R2681 Unsteadiness on feet: Secondary | ICD-10-CM | POA: Diagnosis not present

## 2015-11-08 DIAGNOSIS — M6281 Muscle weakness (generalized): Secondary | ICD-10-CM | POA: Diagnosis not present

## 2015-11-08 DIAGNOSIS — R29898 Other symptoms and signs involving the musculoskeletal system: Secondary | ICD-10-CM | POA: Diagnosis not present

## 2015-11-08 DIAGNOSIS — R29818 Other symptoms and signs involving the nervous system: Secondary | ICD-10-CM | POA: Diagnosis not present

## 2015-11-10 DIAGNOSIS — R2681 Unsteadiness on feet: Secondary | ICD-10-CM | POA: Diagnosis not present

## 2015-11-10 DIAGNOSIS — R29818 Other symptoms and signs involving the nervous system: Secondary | ICD-10-CM | POA: Diagnosis not present

## 2015-11-10 DIAGNOSIS — R0600 Dyspnea, unspecified: Secondary | ICD-10-CM | POA: Diagnosis not present

## 2015-11-10 DIAGNOSIS — M6281 Muscle weakness (generalized): Secondary | ICD-10-CM | POA: Diagnosis not present

## 2015-11-10 DIAGNOSIS — N3946 Mixed incontinence: Secondary | ICD-10-CM | POA: Diagnosis not present

## 2015-11-10 DIAGNOSIS — R29898 Other symptoms and signs involving the musculoskeletal system: Secondary | ICD-10-CM | POA: Diagnosis not present

## 2015-11-10 DIAGNOSIS — R32 Unspecified urinary incontinence: Secondary | ICD-10-CM | POA: Diagnosis not present

## 2015-11-11 DIAGNOSIS — R2681 Unsteadiness on feet: Secondary | ICD-10-CM | POA: Diagnosis not present

## 2015-11-11 DIAGNOSIS — M6281 Muscle weakness (generalized): Secondary | ICD-10-CM | POA: Diagnosis not present

## 2015-11-11 DIAGNOSIS — R29818 Other symptoms and signs involving the nervous system: Secondary | ICD-10-CM | POA: Diagnosis not present

## 2015-11-11 DIAGNOSIS — R32 Unspecified urinary incontinence: Secondary | ICD-10-CM | POA: Diagnosis not present

## 2015-11-11 DIAGNOSIS — R29898 Other symptoms and signs involving the musculoskeletal system: Secondary | ICD-10-CM | POA: Diagnosis not present

## 2015-11-11 DIAGNOSIS — R0600 Dyspnea, unspecified: Secondary | ICD-10-CM | POA: Diagnosis not present

## 2015-11-18 ENCOUNTER — Ambulatory Visit: Payer: Medicare Other

## 2015-11-23 DIAGNOSIS — R0600 Dyspnea, unspecified: Secondary | ICD-10-CM | POA: Diagnosis not present

## 2015-11-23 DIAGNOSIS — R29818 Other symptoms and signs involving the nervous system: Secondary | ICD-10-CM | POA: Diagnosis not present

## 2015-11-23 DIAGNOSIS — R2681 Unsteadiness on feet: Secondary | ICD-10-CM | POA: Diagnosis not present

## 2015-11-23 DIAGNOSIS — R29898 Other symptoms and signs involving the musculoskeletal system: Secondary | ICD-10-CM | POA: Diagnosis not present

## 2015-11-23 DIAGNOSIS — M6281 Muscle weakness (generalized): Secondary | ICD-10-CM | POA: Diagnosis not present

## 2015-11-23 DIAGNOSIS — R32 Unspecified urinary incontinence: Secondary | ICD-10-CM | POA: Diagnosis not present

## 2015-11-24 ENCOUNTER — Other Ambulatory Visit (INDEPENDENT_AMBULATORY_CARE_PROVIDER_SITE_OTHER): Payer: Medicare Other

## 2015-11-24 ENCOUNTER — Ambulatory Visit
Admission: RE | Admit: 2015-11-24 | Discharge: 2015-11-24 | Disposition: A | Discharge status: Home / Self Care | Payer: Medicare Other | Source: Ambulatory Visit

## 2015-11-24 DIAGNOSIS — R2681 Unsteadiness on feet: Secondary | ICD-10-CM | POA: Diagnosis not present

## 2015-11-24 DIAGNOSIS — M6281 Muscle weakness (generalized): Secondary | ICD-10-CM | POA: Diagnosis not present

## 2015-11-24 DIAGNOSIS — Z1231 Encounter for screening mammogram for malignant neoplasm of breast: Secondary | ICD-10-CM

## 2015-11-24 DIAGNOSIS — E785 Hyperlipidemia, unspecified: Secondary | ICD-10-CM | POA: Diagnosis not present

## 2015-11-24 DIAGNOSIS — R29898 Other symptoms and signs involving the musculoskeletal system: Secondary | ICD-10-CM | POA: Diagnosis not present

## 2015-11-24 DIAGNOSIS — R32 Unspecified urinary incontinence: Secondary | ICD-10-CM | POA: Diagnosis not present

## 2015-11-24 DIAGNOSIS — R0600 Dyspnea, unspecified: Secondary | ICD-10-CM | POA: Diagnosis not present

## 2015-11-24 DIAGNOSIS — R29818 Other symptoms and signs involving the nervous system: Secondary | ICD-10-CM | POA: Diagnosis not present

## 2015-11-24 LAB — HEPATIC FUNCTION PANEL
ALT: 15 U/L (ref 0–35)
AST: 18 U/L (ref 0–37)
Albumin: 3.9 g/dL (ref 3.5–5.2)
Alkaline Phosphatase: 64 U/L (ref 39–117)
Bilirubin, Direct: 0.1 mg/dL (ref 0.0–0.3)
Total Bilirubin: 0.6 mg/dL (ref 0.2–1.2)
Total Protein: 6.9 g/dL (ref 6.0–8.3)

## 2015-11-24 LAB — LIPID PANEL
Cholesterol: 148 mg/dL (ref 0–200)
HDL: 48.9 mg/dL (ref >39.00)
LDL Cholesterol: 75 mg/dL (ref 0–99)
NonHDL: 98.93
Total CHOL/HDL Ratio: 3
Triglycerides: 119 mg/dL (ref 0.0–149.0)
VLDL: 23.8 mg/dL (ref 0.0–40.0)

## 2015-11-25 ENCOUNTER — Other Ambulatory Visit: Payer: Self-pay | Admitting: Family Medicine

## 2015-11-28 DIAGNOSIS — R0600 Dyspnea, unspecified: Secondary | ICD-10-CM | POA: Diagnosis not present

## 2015-11-28 DIAGNOSIS — R29898 Other symptoms and signs involving the musculoskeletal system: Secondary | ICD-10-CM | POA: Diagnosis not present

## 2015-11-28 DIAGNOSIS — R29818 Other symptoms and signs involving the nervous system: Secondary | ICD-10-CM | POA: Diagnosis not present

## 2015-11-28 DIAGNOSIS — R2681 Unsteadiness on feet: Secondary | ICD-10-CM | POA: Diagnosis not present

## 2015-11-28 DIAGNOSIS — M6281 Muscle weakness (generalized): Secondary | ICD-10-CM | POA: Diagnosis not present

## 2015-11-28 DIAGNOSIS — R32 Unspecified urinary incontinence: Secondary | ICD-10-CM | POA: Diagnosis not present

## 2015-11-30 DIAGNOSIS — F341 Dysthymic disorder: Secondary | ICD-10-CM | POA: Diagnosis not present

## 2015-12-01 DIAGNOSIS — R32 Unspecified urinary incontinence: Secondary | ICD-10-CM | POA: Diagnosis not present

## 2015-12-01 DIAGNOSIS — R29818 Other symptoms and signs involving the nervous system: Secondary | ICD-10-CM | POA: Diagnosis not present

## 2015-12-01 DIAGNOSIS — R0600 Dyspnea, unspecified: Secondary | ICD-10-CM | POA: Diagnosis not present

## 2015-12-01 DIAGNOSIS — R29898 Other symptoms and signs involving the musculoskeletal system: Secondary | ICD-10-CM | POA: Diagnosis not present

## 2015-12-01 DIAGNOSIS — M6281 Muscle weakness (generalized): Secondary | ICD-10-CM | POA: Diagnosis not present

## 2015-12-01 DIAGNOSIS — R2681 Unsteadiness on feet: Secondary | ICD-10-CM | POA: Diagnosis not present

## 2015-12-08 ENCOUNTER — Ambulatory Visit (INDEPENDENT_AMBULATORY_CARE_PROVIDER_SITE_OTHER): Payer: Medicare Other | Admitting: Neurology

## 2015-12-08 ENCOUNTER — Ambulatory Visit (INDEPENDENT_AMBULATORY_CARE_PROVIDER_SITE_OTHER): Payer: Medicare Other | Admitting: Family Medicine

## 2015-12-08 ENCOUNTER — Encounter: Payer: Self-pay | Admitting: Neurology

## 2015-12-08 VITALS — BP 130/80 | HR 106 | Temp 98.4°F | Ht 63.0 in | Wt 190.2 lb

## 2015-12-08 VITALS — BP 134/76 | HR 82 | Ht 63.0 in | Wt 189.2 lb

## 2015-12-08 DIAGNOSIS — I10 Essential (primary) hypertension: Secondary | ICD-10-CM

## 2015-12-08 DIAGNOSIS — E785 Hyperlipidemia, unspecified: Secondary | ICD-10-CM | POA: Diagnosis not present

## 2015-12-08 DIAGNOSIS — K5900 Constipation, unspecified: Secondary | ICD-10-CM

## 2015-12-08 DIAGNOSIS — I63032 Cerebral infarction due to thrombosis of left carotid artery: Secondary | ICD-10-CM

## 2015-12-08 DIAGNOSIS — I639 Cerebral infarction, unspecified: Secondary | ICD-10-CM | POA: Diagnosis not present

## 2015-12-08 DIAGNOSIS — I6381 Other cerebral infarction due to occlusion or stenosis of small artery: Secondary | ICD-10-CM

## 2015-12-08 NOTE — Progress Notes (Signed)
   Subjective:    Patient ID: Sara Logan, female    DOB: Apr 17, 1933, 82 y.o.   MRN: [id number]  HPI Here for medical follow-up to discuss multiple issues:   Recent aborted left brain stroke status post IV TPA. Patient had elevated blood pressure last visit. We started amlodipine 5 mg daily. Tolerating well with no major side effects. She does have some chronic mild leg edema. Has used compression for that in the past and requesting prescription for compression hose.  Follow-up with neurologist this morning. No changes made in medication. Addition of Lipitor 10 mg daily during her hospitalization for stroke. Recent follow-up labs LDL 75. Patient thinks she may be having some mild muscle aches related to this but not intolerable.  Recently has had some occasional leakage of stool. She usually has constipation issues and may be having some leakage around stool. She sometimes goes a few days without bowel movement. Currently not taking fiber supplement. No abdominal pain. No bloody stools. Sometimes has to strain for stools.  Past Medical History  Diagnosis Date  . Arthritis   . Depression   . Hyperlipidemia   . Osteopenia   . Thrombocytopenia ([place])   . Vitamin D deficiency   . Wrist fracture 2002    left  . Stroke [facility])    Past Surgical History  Procedure Laterality Date  . Joint replacement  2002    right total   . Carpal tunnel release  2008    right  . Appendectomy  1967    reports that she has never smoked. She does not have any smokeless tobacco history on file. She reports that she does not drink alcohol or use illicit drugs. family history includes Cancer in her mother and sister. No Known Allergies    Review of Systems  Constitutional: Negative for fatigue.  Eyes: Negative for visual disturbance.  Respiratory: Negative for cough, chest tightness, shortness of breath and wheezing.   Cardiovascular: Negative for chest pain, palpitations and leg swelling.    Gastrointestinal: Positive for constipation. Negative for nausea and vomiting.  Genitourinary: Negative for dysuria.  Neurological: Negative for dizziness, seizures, syncope, weakness, light-headedness and headaches.       Objective:   Physical Exam  Constitutional: She is oriented to person, place, and time. She appears well-developed and well-nourished.  Eyes: Pupils are equal, round, and reactive to light.  Neck: Neck supple. No JVD present. No thyromegaly present.  Cardiovascular: Normal rate and regular rhythm.  Exam reveals no gallop.   Pulmonary/Chest: Effort normal and breath sounds normal. No respiratory distress. She has no wheezes. She has no rales.  Musculoskeletal: She exhibits no edema.  Neurological: She is alert and oriented to person, place, and time. No cranial nerve deficit.  Full strength throughout          Assessment & Plan:   #1 recent aborted stroke status post TPA. Patient doing well. We discussed secondary risk factor prevention. Goal LDL less than 100. She has no history of diabetes. Blood pressure goal less than 130/90.  #2 hypertension. Improved and at goal as above-after addition of Amlodipine. . Continue amlodipine   #3 hyperlipidemia. Recent lipids as above.  Continue Lipitor.   #4 intermittent constipation. Handout given. Increase fluid intake. Suggest over-the-counter fiber supplements such as FiberCon or Citrucel

## 2015-12-08 NOTE — Progress Notes (Signed)
[facility] [address] [place]. [place] [address] [id number]       OFFICE FOLLOW-UP NOTE  [name] Date of Birth:  [birth year] Medical Record Number:  [id number]   HPI: 48 year lady seen today for first office follow-up visit following hospital admission for stroke in January 2017. Sara Logan is a 82 y.o. female with a history of hyperlipidemia who presented with sudden onset right-sided weakness that started not eating lunch. She sat down around 12:30 PM, on 09/25/2015 and got up at 1:30 PM and noticed that her right side was dragging. EMS was called and she was transported as a code stroke. After arrival here, she was found to have significant right-sided weakness which was felt to merit TPA and therefore risks and benefits were discussed and she agreed to proceed with TPA. Her last known well was listed as 1:30 PM on 09/25/2015. She was admitted to the neuro ICU for further evaluation and treatment. Her blood pressure was tightly controlled. She made full neurologic recovery and remained stable. MRI scan of the brain revealed no acute infarct but showed remote age left thalamic infarct and changes of small vessel disease and mild generalized atrophy. MRA of the brain showed no large vessel intracranial stenosis. MRA of the neck also showed no significant extracranial stenosis. Transthoracic echo showed normal ejection fraction without cardiac source of embolism. Hemoglobin A1c was 5.9 in August 2016. LDL cholesterol was 1-2 mg percent. Patient didn't well and was discharged home. She is currently living at home. She says she is back to her baseline and has no deficits. She states her blood pressure is well controlled today it is 134/76. She is tolerating her Lipitor well without myalgias or arthralgias or other side effects. She is currently living at friend's home [place]. She's had some recent symptoms of irritable bowel and plans to see her primary felt care  physician for the same. She has not had any follow-up lipid profile checked after starting Lipitor. She is tolerating aspirin well without any side effects.  ROS:   14 system review of systems is positive for eye itching, diarrhea, incontinence of bladder, frequent waking and all other systems negative  PMH:  Past Medical History  Diagnosis Date  . Arthritis   . Depression   . Hyperlipidemia   . Osteopenia   . Thrombocytopenia ([place])   . Vitamin D deficiency   . Wrist fracture 2002    left  . Stroke [facility])     Social History:  Social History   Social History  . Marital Status: Married    Spouse Name: N/A  . Number of Children: N/A  . Years of Education: N/A   Occupational History  . Not on file.   Social History Main Topics  . Smoking status: Never Smoker   . Smokeless tobacco: Not on file  . Alcohol Use: No  . Drug Use: No  . Sexual Activity: Not on file   Other Topics Concern  . Not on file   Social History Narrative    Medications:   Current Outpatient Prescriptions on File Prior to Visit  Medication Sig Dispense Refill  . amLODipine (NORVASC) 5 MG tablet Take 1 tablet (5 mg total) by mouth daily. 30 tablet 3  . amoxicillin (AMOXIL) 500 MG capsule For Dental Procedure    . aspirin EC 325 MG EC tablet Take 1 tablet (325 mg total) by mouth daily. 30 tablet 0  . atorvastatin (LIPITOR) 10 MG  tablet Take 1 tablet (10 mg total) by mouth daily at 6 PM. 30 tablet 2  . Biotin 10 MG TABS Take 10 mg by mouth daily.     . Cholecalciferol (VITAMIN D) 2000 UNITS CAPS Take 2,000 Units by mouth daily.     . Probiotic Product (PRO-BIOTIC BLEND PO) Take 1 tablet by mouth daily.     Marland Kitchen Specialty Vitamins Products (MAGNESIUM, AMINO ACID CHELATE,) 133 MG tablet Take 1 tablet by mouth daily.    Marland Kitchen venlafaxine XR (EFFEXOR-XR) 75 MG 24 hr capsule TAKE 2 CAPSULES (150MG ) BY MOUTH DAILY. 60 capsule 5  . vitamin C (ASCORBIC ACID) 500 MG tablet Take 500 mg by mouth daily.    . vitamin E  100 UNIT capsule Take 100 Units by mouth daily.     No current facility-administered medications on file prior to visit.    Allergies:  No Known Allergies  Physical Exam General: well developed, well nourished elderly lady, seated, in no evident distress Head: head normocephalic and atraumatic.  Neck: supple with no carotid or supraclavicular bruits Cardiovascular: regular rate and rhythm, no murmurs Musculoskeletal: no deformity Skin:  no rash/petichiae Vascular:  Normal pulses all extremities Filed Vitals:   12/08/15 0927  BP: 134/76  Pulse: 82   Neurologic Exam Mental Status: Awake and fully alert. Oriented to place and time. Recent and remote memory intact. Attention span, concentration and fund of knowledge appropriate. Mood and affect appropriate.  Cranial Nerves: Fundoscopic exam reveals sharp disc margins. Pupils equal, briskly reactive to light. Extraocular movements full without nystagmus. Visual fields full to confrontation. Hearing intact. Facial sensation intact. Face, tongue, palate moves normally and symmetrically.  Motor: Normal bulk and tone. Normal strength in all tested extremity muscles.Diminished fine finger movements on the right. Orbits left over right upper extremity. Sensory.: intact to touch ,pinprick .position and vibratory sensation.  Coordination: Rapid alternating movements normal in all extremities. Finger-to-nose and heel-to-shin performed accurately bilaterally. Gait and Station: Arises from chair without difficulty. Stance is normal. Gait demonstrates normal stride length and balance . Able to heel, toe and tandem walk without difficulty.  Reflexes: 1+ and symmetric. Toes downgoing.   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 Vascular risk factors of hypertension and hyperlipidemia and  age    PLAN: I had a long d/w patient about her recent stroke, risk for recurrent stroke/TIAs, personally independently reviewed imaging studies and stroke evaluation results and answered questions.Continue aspirin 325 mg daily  for secondary stroke prevention and maintain strict control of hypertension with blood pressure goal below 130/90, diabetes with hemoglobin A1c goal below 6.5% and lipids with LDL cholesterol goal below 70 mg/dL. I also advised the patient to eat a healthy diet with plenty of whole grains, cereals, fruits and vegetables, exercise regularly and maintain ideal body weightGreater than 50% of time during this 25 minute visit was spent on counseling,explanation of diagnosis, planning of further management, discussion with patient and family and coordination of care. Followup in the future with me in 6 months or call earlier if necessary Antony Contras, MD   Note: This document was prepared with digital dictation and possible smart phrase technology. Any transcriptional errors that result from this process are unintentional

## 2015-12-08 NOTE — Patient Instructions (Signed)
Constipation, Adult Constipation is when a person has fewer than three bowel movements a week, has difficulty having a bowel movement, or has stools that are dry, hard, or larger than normal. As people grow older, constipation is more common. A low-fiber diet, not taking in enough fluids, and taking certain medicines may make constipation worse.  CAUSES   Certain medicines, such as antidepressants, pain medicine, iron supplements, antacids, and water pills.   Certain diseases, such as diabetes, irritable bowel syndrome (IBS), thyroid disease, or depression.   Not drinking enough water.   Not eating enough fiber-rich foods.   Stress or travel.   Lack of physical activity or exercise.   Ignoring the urge to have a bowel movement.   Using laxatives too much.  SIGNS AND SYMPTOMS   Having fewer than three bowel movements a week.   Straining to have a bowel movement.   Having stools that are hard, dry, or larger than normal.   Feeling full or bloated.   Pain in the lower abdomen.   Not feeling relief after having a bowel movement.  DIAGNOSIS  Your health care provider will take a medical history and perform a physical exam. Further testing may be done for severe constipation. Some tests may include:  A barium enema X-ray to examine your rectum, colon, and, sometimes, your small intestine.   A sigmoidoscopy to examine your lower colon.   A colonoscopy to examine your entire colon. TREATMENT  Treatment will depend on the severity of your constipation and what is causing it. Some dietary treatments include drinking more fluids and eating more fiber-rich foods. Lifestyle treatments may include regular exercise. If these diet and lifestyle recommendations do not help, your health care provider may recommend taking over-the-counter laxative medicines to help you have bowel movements. Prescription medicines may be prescribed if over-the-counter medicines do not work.   HOME CARE INSTRUCTIONS   Eat foods that have a lot of fiber, such as fruits, vegetables, whole grains, and beans.  Limit foods high in fat and processed sugars, such as french fries, hamburgers, cookies, candies, and soda.   A fiber supplement may be added to your diet if you cannot get enough fiber from foods.   Drink enough fluids to keep your urine clear or pale yellow.   Exercise regularly or as directed by your health care provider.   Go to the restroom when you have the urge to go. Do not hold it.   Only take over-the-counter or prescription medicines as directed by your health care provider. Do not take other medicines for constipation without talking to your health care provider first.  [place] IF:   You have bright red blood in your stool.   Your constipation lasts for more than 4 days or gets worse.   You have abdominal or rectal pain.   You have thin, pencil-like stools.   You have unexplained weight loss. MAKE SURE YOU:   Understand these instructions.  Will watch your condition.  Will get help right away if you are not doing well or get worse.   This information is not intended to replace advice given to you by your health care provider. Make sure you discuss any questions you have with your health care provider.   Document Released: 05/25/2004 Document Revised: 09/17/2014 Document Reviewed: 06/08/2013 Elsevier Interactive Patient Education 2016 [employer].  Consider addition of Metamucil or Fibercon.

## 2015-12-08 NOTE — Patient Instructions (Signed)
I had a long d/w patient about her recent stroke, risk for recurrent stroke/TIAs, personally independently reviewed imaging studies and stroke evaluation results and answered questions.Continue aspirin 325 mg daily  for secondary stroke prevention and maintain strict control of hypertension with blood pressure goal below 130/90, diabetes with hemoglobin A1c goal below 6.5% and lipids with LDL cholesterol goal below 70 mg/dL. I also advised the patient to eat a healthy diet with plenty of whole grains, cereals, fruits and vegetables, exercise regularly and maintain ideal body weight Followup in the future with me in 6 months or call earlier if necessary Stroke Prevention Some medical conditions and behaviors are associated with an increased chance of having a stroke. You may prevent a stroke by making healthy choices and managing medical conditions. HOW CAN I REDUCE MY RISK OF HAVING A STROKE?   Stay physically active. Get at least 30 minutes of activity on most or all days.  Do not smoke. It may also be helpful to avoid exposure to secondhand smoke.  Limit alcohol use. Moderate alcohol use is considered to be:  No more than 2 drinks per day for men.  No more than 1 drink per day for nonpregnant women.  Eat healthy foods. This involves:  Eating 5 or more servings of fruits and vegetables a day.  Making dietary changes that address high blood pressure (hypertension), high cholesterol, diabetes, or obesity.  Manage your cholesterol levels.  Making food choices that are high in fiber and low in saturated fat, trans fat, and cholesterol may control cholesterol levels.  Take any prescribed medicines to control cholesterol as directed by your health care provider.  Manage your diabetes.  Controlling your carbohydrate and sugar intake is recommended to manage diabetes.  Take any prescribed medicines to control diabetes as directed by your health care provider.  Control your  hypertension.  Making food choices that are low in salt (sodium), saturated fat, trans fat, and cholesterol is recommended to manage hypertension.  Ask your health care provider if you need treatment to lower your blood pressure. Take any prescribed medicines to control hypertension as directed by your health care provider.  If you are 62-8 years of age, have your blood pressure checked every 3-5 years. If you are 59 years of age or older, have your blood pressure checked every year.  Maintain a healthy weight.  Reducing calorie intake and making food choices that are low in sodium, saturated fat, trans fat, and cholesterol are recommended to manage weight.  Stop drug abuse.  Avoid taking birth control pills.  Talk to your health care provider about the risks of taking birth control pills if you are over 2 years old, smoke, get migraines, or have ever had a blood clot.  Get evaluated for sleep disorders (sleep apnea).  Talk to your health care provider about getting a sleep evaluation if you snore a lot or have excessive sleepiness.  Take medicines only as directed by your health care provider.  For some people, aspirin or blood thinners (anticoagulants) are helpful in reducing the risk of forming abnormal blood clots that can lead to stroke. If you have the irregular heart rhythm of atrial fibrillation, you should be on a blood thinner unless there is a good reason you cannot take them.  Understand all your medicine instructions.  Make sure that other conditions (such as anemia or atherosclerosis) are addressed. SEEK IMMEDIATE MEDICAL CARE IF:   You have sudden weakness or numbness of the face, arm, or  leg, especially on one side of the body.  Your face or eyelid droops to one side.  You have sudden confusion.  You have trouble speaking (aphasia) or understanding.  You have sudden trouble seeing in one or both eyes.  You have sudden trouble walking.  You have  dizziness.  You have a loss of balance or coordination.  You have a sudden, severe headache with no known cause.  You have new chest pain or an irregular heartbeat. Any of these symptoms may represent a serious problem that is an emergency. Do not wait to see if the symptoms will go away. Get medical help at once. Call your local emergency services (911 in [place] Do not drive yourself to the hospital.   This information is not intended to replace advice given to you by your health care provider. Make sure you discuss any questions you have with your health care provider.   Document Released: 10/04/2004 Document Revised: 09/17/2014 Document Reviewed: 02/27/2013 Elsevier Interactive Patient Education [employer].

## 2015-12-09 DIAGNOSIS — R2681 Unsteadiness on feet: Secondary | ICD-10-CM | POA: Diagnosis not present

## 2015-12-09 DIAGNOSIS — R32 Unspecified urinary incontinence: Secondary | ICD-10-CM | POA: Diagnosis not present

## 2015-12-09 DIAGNOSIS — M6281 Muscle weakness (generalized): Secondary | ICD-10-CM | POA: Diagnosis not present

## 2015-12-09 DIAGNOSIS — R29898 Other symptoms and signs involving the musculoskeletal system: Secondary | ICD-10-CM | POA: Diagnosis not present

## 2015-12-09 DIAGNOSIS — R29818 Other symptoms and signs involving the nervous system: Secondary | ICD-10-CM | POA: Diagnosis not present

## 2015-12-09 DIAGNOSIS — R0600 Dyspnea, unspecified: Secondary | ICD-10-CM | POA: Diagnosis not present

## 2015-12-12 ENCOUNTER — Ambulatory Visit: Payer: Medicare Other | Admitting: Family Medicine

## 2015-12-14 DIAGNOSIS — F341 Dysthymic disorder: Secondary | ICD-10-CM | POA: Diagnosis not present

## 2015-12-27 DIAGNOSIS — M6281 Muscle weakness (generalized): Secondary | ICD-10-CM | POA: Diagnosis not present

## 2015-12-27 DIAGNOSIS — M545 Low back pain: Secondary | ICD-10-CM | POA: Diagnosis not present

## 2015-12-27 DIAGNOSIS — R29818 Other symptoms and signs involving the nervous system: Secondary | ICD-10-CM | POA: Diagnosis not present

## 2015-12-27 DIAGNOSIS — N3946 Mixed incontinence: Secondary | ICD-10-CM | POA: Diagnosis not present

## 2015-12-27 DIAGNOSIS — M25551 Pain in right hip: Secondary | ICD-10-CM | POA: Diagnosis not present

## 2015-12-27 DIAGNOSIS — R0600 Dyspnea, unspecified: Secondary | ICD-10-CM | POA: Diagnosis not present

## 2015-12-27 DIAGNOSIS — R29898 Other symptoms and signs involving the musculoskeletal system: Secondary | ICD-10-CM | POA: Diagnosis not present

## 2015-12-27 DIAGNOSIS — R2681 Unsteadiness on feet: Secondary | ICD-10-CM | POA: Diagnosis not present

## 2015-12-27 DIAGNOSIS — R32 Unspecified urinary incontinence: Secondary | ICD-10-CM | POA: Diagnosis not present

## 2015-12-28 DIAGNOSIS — F341 Dysthymic disorder: Secondary | ICD-10-CM | POA: Diagnosis not present

## 2015-12-29 DIAGNOSIS — R29818 Other symptoms and signs involving the nervous system: Secondary | ICD-10-CM | POA: Diagnosis not present

## 2015-12-29 DIAGNOSIS — R29898 Other symptoms and signs involving the musculoskeletal system: Secondary | ICD-10-CM | POA: Diagnosis not present

## 2015-12-29 DIAGNOSIS — M25551 Pain in right hip: Secondary | ICD-10-CM | POA: Diagnosis not present

## 2015-12-29 DIAGNOSIS — M6281 Muscle weakness (generalized): Secondary | ICD-10-CM | POA: Diagnosis not present

## 2015-12-29 DIAGNOSIS — M545 Low back pain: Secondary | ICD-10-CM | POA: Diagnosis not present

## 2015-12-29 DIAGNOSIS — R32 Unspecified urinary incontinence: Secondary | ICD-10-CM | POA: Diagnosis not present

## 2016-01-03 DIAGNOSIS — R32 Unspecified urinary incontinence: Secondary | ICD-10-CM | POA: Diagnosis not present

## 2016-01-03 DIAGNOSIS — R29818 Other symptoms and signs involving the nervous system: Secondary | ICD-10-CM | POA: Diagnosis not present

## 2016-01-03 DIAGNOSIS — R29898 Other symptoms and signs involving the musculoskeletal system: Secondary | ICD-10-CM | POA: Diagnosis not present

## 2016-01-03 DIAGNOSIS — M25551 Pain in right hip: Secondary | ICD-10-CM | POA: Diagnosis not present

## 2016-01-03 DIAGNOSIS — M6281 Muscle weakness (generalized): Secondary | ICD-10-CM | POA: Diagnosis not present

## 2016-01-03 DIAGNOSIS — M545 Low back pain: Secondary | ICD-10-CM | POA: Diagnosis not present

## 2016-01-03 NOTE — Patient Outreach (Signed)
[place] [facility]) Care Management  01/03/2016  Sara Logan [birth year] [id number]   Per physician note in patient's chart for office visit on 12/08/15, mRS was successfully completed. mRS = 1

## 2016-01-05 DIAGNOSIS — M6281 Muscle weakness (generalized): Secondary | ICD-10-CM | POA: Diagnosis not present

## 2016-01-05 DIAGNOSIS — M25551 Pain in right hip: Secondary | ICD-10-CM | POA: Diagnosis not present

## 2016-01-05 DIAGNOSIS — R29818 Other symptoms and signs involving the nervous system: Secondary | ICD-10-CM | POA: Diagnosis not present

## 2016-01-05 DIAGNOSIS — M545 Low back pain: Secondary | ICD-10-CM | POA: Diagnosis not present

## 2016-01-05 DIAGNOSIS — R29898 Other symptoms and signs involving the musculoskeletal system: Secondary | ICD-10-CM | POA: Diagnosis not present

## 2016-01-05 DIAGNOSIS — R32 Unspecified urinary incontinence: Secondary | ICD-10-CM | POA: Diagnosis not present

## 2016-01-06 ENCOUNTER — Other Ambulatory Visit: Payer: Self-pay

## 2016-01-06 NOTE — Patient Outreach (Signed)
[place] [facility]) Care Management  01/06/2016  Sara Logan 1933/02/27 [id number]   Correction to previous note about mRS score. Additional score will be collected in the required timeframe of 12/11/15 and 01/09/16 to meet 90 day requirement. See subsequent notes for additional score.

## 2016-01-06 NOTE — Patient Outreach (Signed)
First outreach attempt to obtain mRS. Left message for return call.

## 2016-01-09 ENCOUNTER — Telehealth: Payer: Self-pay

## 2016-01-09 NOTE — Patient Outreach (Signed)
Second attempt to reach patient regarding mrS.  Left message for a return call.  [place] Care Management Assistant

## 2016-01-10 ENCOUNTER — Other Ambulatory Visit: Payer: Self-pay

## 2016-01-10 DIAGNOSIS — R32 Unspecified urinary incontinence: Secondary | ICD-10-CM | POA: Diagnosis not present

## 2016-01-10 DIAGNOSIS — R2681 Unsteadiness on feet: Secondary | ICD-10-CM | POA: Diagnosis not present

## 2016-01-10 DIAGNOSIS — R0600 Dyspnea, unspecified: Secondary | ICD-10-CM | POA: Diagnosis not present

## 2016-01-10 DIAGNOSIS — M6281 Muscle weakness (generalized): Secondary | ICD-10-CM | POA: Diagnosis not present

## 2016-01-10 DIAGNOSIS — R29898 Other symptoms and signs involving the musculoskeletal system: Secondary | ICD-10-CM | POA: Diagnosis not present

## 2016-01-10 DIAGNOSIS — M25551 Pain in right hip: Secondary | ICD-10-CM | POA: Diagnosis not present

## 2016-01-10 DIAGNOSIS — M545 Low back pain: Secondary | ICD-10-CM | POA: Diagnosis not present

## 2016-01-10 DIAGNOSIS — R29818 Other symptoms and signs involving the nervous system: Secondary | ICD-10-CM | POA: Diagnosis not present

## 2016-01-10 DIAGNOSIS — N3946 Mixed incontinence: Secondary | ICD-10-CM | POA: Diagnosis not present

## 2016-01-10 NOTE — Patient Outreach (Signed)
3rd outreach attempt to collect mRS from patient. Telephone outreach to patient to obtain mRS was successfully completed. mRS = 1.

## 2016-01-12 DIAGNOSIS — F341 Dysthymic disorder: Secondary | ICD-10-CM | POA: Diagnosis not present

## 2016-01-16 DIAGNOSIS — Z961 Presence of intraocular lens: Secondary | ICD-10-CM | POA: Diagnosis not present

## 2016-01-16 DIAGNOSIS — H268 Other specified cataract: Secondary | ICD-10-CM | POA: Diagnosis not present

## 2016-01-17 DIAGNOSIS — R32 Unspecified urinary incontinence: Secondary | ICD-10-CM | POA: Diagnosis not present

## 2016-01-17 DIAGNOSIS — R29818 Other symptoms and signs involving the nervous system: Secondary | ICD-10-CM | POA: Diagnosis not present

## 2016-01-17 DIAGNOSIS — M545 Low back pain: Secondary | ICD-10-CM | POA: Diagnosis not present

## 2016-01-17 DIAGNOSIS — R29898 Other symptoms and signs involving the musculoskeletal system: Secondary | ICD-10-CM | POA: Diagnosis not present

## 2016-01-17 DIAGNOSIS — M25551 Pain in right hip: Secondary | ICD-10-CM | POA: Diagnosis not present

## 2016-01-17 DIAGNOSIS — M6281 Muscle weakness (generalized): Secondary | ICD-10-CM | POA: Diagnosis not present

## 2016-01-18 DIAGNOSIS — R490 Dysphonia: Secondary | ICD-10-CM | POA: Diagnosis not present

## 2016-01-18 DIAGNOSIS — R1314 Dysphagia, pharyngoesophageal phase: Secondary | ICD-10-CM | POA: Diagnosis not present

## 2016-01-19 DIAGNOSIS — R29818 Other symptoms and signs involving the nervous system: Secondary | ICD-10-CM | POA: Diagnosis not present

## 2016-01-19 DIAGNOSIS — M6281 Muscle weakness (generalized): Secondary | ICD-10-CM | POA: Diagnosis not present

## 2016-01-19 DIAGNOSIS — R29898 Other symptoms and signs involving the musculoskeletal system: Secondary | ICD-10-CM | POA: Diagnosis not present

## 2016-01-19 DIAGNOSIS — M25551 Pain in right hip: Secondary | ICD-10-CM | POA: Diagnosis not present

## 2016-01-19 DIAGNOSIS — R32 Unspecified urinary incontinence: Secondary | ICD-10-CM | POA: Diagnosis not present

## 2016-01-19 DIAGNOSIS — M545 Low back pain: Secondary | ICD-10-CM | POA: Diagnosis not present

## 2016-01-24 DIAGNOSIS — R29818 Other symptoms and signs involving the nervous system: Secondary | ICD-10-CM | POA: Diagnosis not present

## 2016-01-24 DIAGNOSIS — M545 Low back pain: Secondary | ICD-10-CM | POA: Diagnosis not present

## 2016-01-24 DIAGNOSIS — M6281 Muscle weakness (generalized): Secondary | ICD-10-CM | POA: Diagnosis not present

## 2016-01-24 DIAGNOSIS — R32 Unspecified urinary incontinence: Secondary | ICD-10-CM | POA: Diagnosis not present

## 2016-01-24 DIAGNOSIS — M25551 Pain in right hip: Secondary | ICD-10-CM | POA: Diagnosis not present

## 2016-01-24 DIAGNOSIS — R29898 Other symptoms and signs involving the musculoskeletal system: Secondary | ICD-10-CM | POA: Diagnosis not present

## 2016-03-15 ENCOUNTER — Ambulatory Visit (INDEPENDENT_AMBULATORY_CARE_PROVIDER_SITE_OTHER): Payer: Medicare Other | Admitting: Adult Health

## 2016-03-15 ENCOUNTER — Encounter: Payer: Self-pay | Admitting: Adult Health

## 2016-03-15 VITALS — BP 134/82 | Temp 98.9°F | Ht 63.0 in | Wt 193.6 lb

## 2016-03-15 DIAGNOSIS — N3001 Acute cystitis with hematuria: Secondary | ICD-10-CM

## 2016-03-15 DIAGNOSIS — I639 Cerebral infarction, unspecified: Secondary | ICD-10-CM | POA: Diagnosis not present

## 2016-03-15 LAB — POCT URINALYSIS DIPSTICK
Bilirubin, UA: NEGATIVE
Glucose, UA: NEGATIVE
Ketones, UA: NEGATIVE
Nitrite, UA: NEGATIVE
Protein, UA: NEGATIVE
Spec Grav, UA: >=1.03
Urobilinogen, UA: 0.2
pH, UA: 6

## 2016-03-15 MED ORDER — SULFAMETHOXAZOLE-TRIMETHOPRIM 800-160 MG PO TABS: 1.0000 | ORAL_TABLET | Freq: Two times a day (BID) | ORAL | Status: DC

## 2016-03-15 NOTE — Progress Notes (Signed)
  [id number] W, MD No chief complaint on file.   Current Issues:  Presents with  days of dysuria, urinary urgency and urinary frequency Associated symptoms include:  dysuria, flank pain on the left, hematuria, lower abdominal pain, urinary frequency, urinary hesitancy, urinary incontinence and urinary urgency  There is a previous history of of similar symptoms.   Prior to Admission medications   Medication Sig Start Date End Date Taking? Authorizing Provider  amLODipine (NORVASC) 5 MG tablet Take 1 tablet (5 mg total) by mouth daily. 10/27/15  Yes Eulas Post, MD  amoxicillin (AMOXIL) 500 MG capsule For Dental Procedure 09/29/15  Yes Historical Provider, MD  aspirin EC 325 MG EC tablet Take 1 tablet (325 mg total) by mouth daily. 09/27/15  Yes Donzetta Starch, NP  Biotin 10 MG TABS Take 10 mg by mouth daily.    Yes Historical Provider, MD  Cholecalciferol (VITAMIN D) 2000 UNITS CAPS Take 2,000 Units by mouth daily.    Yes Historical Provider, MD  Probiotic Product (PRO-BIOTIC BLEND PO) Take 1 tablet by mouth daily.    Yes Historical Provider, MD  Specialty Vitamins Products (MAGNESIUM, AMINO ACID CHELATE,) 133 MG tablet Take 1 tablet by mouth daily.   Yes Historical Provider, MD  venlafaxine XR (EFFEXOR-XR) 75 MG 24 hr capsule TAKE 2 CAPSULES (150MG ) BY MOUTH DAILY. 11/25/15  Yes Eulas Post, MD  vitamin C (ASCORBIC ACID) 500 MG tablet Take 500 mg by mouth daily.   Yes Historical Provider, MD  vitamin E 100 UNIT capsule Take 100 Units by mouth daily.   Yes Historical Provider, MD  atorvastatin (LIPITOR) 10 MG tablet Take 1 tablet (10 mg total) by mouth daily at 6 PM. Patient not taking: Reported on 03/15/2016 09/27/15   Donzetta Starch, NP    Review of Systems: Negative unless mentioned above   PE:  BP 134/82 mmHg  Temp(Src) 98.9 F (37.2 C) (Oral)  Ht 5\' 3"  (1.6 m)  Wt 193 lb 9.6 oz (87.816 kg)  BMI 34.30 kg/m2 Constitutional: Alert and oriented  Heart: Normal rate and  normal rhythm  Lungs: Clear to auscultation  Back: mild left sided CVA tenderness Abdomen: No masses, tenderness, distension or rebound  Results for orders placed or performed in visit on 11/24/15  Lipid panel  Result Value Ref Range   Cholesterol 148 0 - 200 mg/dL   Triglycerides 119.0 0.0 - 149.0 mg/dL   HDL 48.90 >39.00 mg/dL   VLDL 23.8 0.0 - 40.0 mg/dL   LDL Cholesterol 75 0 - 99 mg/dL   Total CHOL/HDL Ratio 3    NonHDL 98.93   Hepatic function panel  Result Value Ref Range   Total Bilirubin 0.6 0.2 - 1.2 mg/dL   Bilirubin, Direct 0.1 0.0 - 0.3 mg/dL   Alkaline Phosphatase 64 39 - 117 U/L   AST 18 0 - 37 U/L   ALT 15 0 - 35 U/L   Total Protein 6.9 6.0 - 8.3 g/dL   Albumin 3.9 3.5 - 5.2 g/dL    Assessment and Plan:    1. Acute cystitis with hematuria [N30.01] - sulfamethoxazole-trimethoprim (BACTRIM DS,SEPTRA DS) 800-160 MG tablet; Take 1 tablet by mouth 2 (two) times daily.  Dispense: 6 tablet; Refill: 0 - POC Urinalysis Dipstick - Urine culture  Dorothyann Peng, NP\

## 2016-03-15 NOTE — Patient Instructions (Signed)

## 2016-03-17 ENCOUNTER — Other Ambulatory Visit: Payer: Self-pay | Admitting: Family Medicine

## 2016-03-17 LAB — URINE CULTURE: Colony Count: >=100000

## 2016-04-10 ENCOUNTER — Ambulatory Visit (INDEPENDENT_AMBULATORY_CARE_PROVIDER_SITE_OTHER): Payer: Medicare Other | Admitting: Family Medicine

## 2016-04-10 VITALS — BP 140/80 | HR 99 | Temp 98.9°F | Ht 63.0 in | Wt 195.7 lb

## 2016-04-10 DIAGNOSIS — M654 Radial styloid tenosynovitis [de Quervain]: Secondary | ICD-10-CM | POA: Diagnosis not present

## 2016-04-10 DIAGNOSIS — I639 Cerebral infarction, unspecified: Secondary | ICD-10-CM | POA: Diagnosis not present

## 2016-04-10 NOTE — Progress Notes (Signed)
Subjective:     Patient ID: Sara Logan, female   DOB: Jun 06, 1933, 82 y.o.   MRN: [id number]  HPI Patient seen with right wrist pain. Onset a few weeks ago. No injury. She has soreness involving the extensor and abductor tendons of the thumb. She's had some mild swelling. No redness. No known injury. Taken Tylenol without much relief No history of similar problem No neck pain  Past Medical History:  Diagnosis Date  . Arthritis   . Depression   . Hyperlipidemia   . Osteopenia   . Stroke ([place])   . Thrombocytopenia ([place])   . Vitamin D deficiency   . Wrist fracture 2002   left   Past Surgical History:  Procedure Laterality Date  . APPENDECTOMY  1967  . CARPAL TUNNEL RELEASE  2008   right  . JOINT REPLACEMENT  2002   right total     reports that she has never smoked. She does not have any smokeless tobacco history on file. She reports that she does not drink alcohol or use drugs. family history includes Cancer in her mother and sister. Allergies  Allergen Reactions  . Bactrim [Sulfamethoxazole-Trimethoprim] Nausea Only  '    Review of Systems  Neurological: Negative for weakness and numbness.       Objective:   Physical Exam  Constitutional: She appears well-developed and well-nourished.  Cardiovascular: Normal rate and regular rhythm.   Pulmonary/Chest: Effort normal and breath sounds normal. No respiratory distress. She has no wheezes. She has no rales.  Musculoskeletal:  Right wrist reveals some tenderness near the radial styloid process. She has tenderness over the extensor and abductor tendons of the right thumb. No erythema. No warmth. She has full strength all digits of the hand.       Assessment:     De Quervain's tenosynovitis right thumb     Plan:     -Try icing 20 minutes 3 times daily  -Consider thumb spica splint if not improved with the above  -Offered steroid injection if not improved in 2-3 weeks  -Avoid overuse activities   Eulas Post MD [facility]

## 2016-04-10 NOTE — Patient Instructions (Signed)
Alfonse Ras Disease Alfonse Ras disease is inflammation of the tendon on the thumb side of the wrist. Tendons are cords of tissue that connect bones to muscles. The tendons in your hand pass through a tunnel, or sheath. A slippery layer of tissue (synovium) lets the tendons move smoothly in the sheath. With de Quervain disease, the sheath swells or thickens, causing friction and pain. The condition is also called de Quervain tendinosis and de Quervain syndrome. It occurs most often in women who are 15-41 years old. CAUSES  The exact cause of de Quervain disease is not known. It may result from:   Overusing your hands, especially with repetitive motions that involve twisting your hand or using a forceful grip.  Pregnancy.  Rheumatoid disease. RISK FACTORS You may have a greater risk for de Quervain disease if you:  Are a middle-aged woman.  Are pregnant.  Have rheumatoid arthritis.  Have diabetes.  Use your hands far more than normal, especially with a tight grip or excessive twisting. SIGNS AND SYMPTOMS Pain on the thumb side of your wrist is the main symptom of de Quervain disease. Other signs and symptoms include:  Pain that gets worse when you grasp something or turn your wrist.  Pain that extends up the forearm.  Cysts in the area of the pain.  Swelling of your wrist and hand.  A sensation of snapping in the wrist.  Trouble moving the thumb and wrist. DIAGNOSIS  Your health care provider may diagnose de Quervain disease based on your signs and symptoms. A physical exam will also be done. A simple test Wynn Maudlin test) that involves pulling your thumb and wrist to see if this causes pain can help determine whether you have the condition. Sometimes you may need to have an X-ray.  TREATMENT  Avoiding any activity that causes pain and swelling is the best treatment. Other options include:  Wearing a splint.  Taking medicine. Anti-inflammatory medicines and corticosteroid  injections may reduce inflammation and relieve pain.  Having surgery if other treatments do not work. HOME CARE INSTRUCTIONS   Using ice can be helpful after doing activities that involve the sore wrist. To apply ice to the injured area:  Put ice in a plastic bag.  Place a towel between your skin and the bag.  Leave the ice on for 20 minutes, 2-3 times a day.  Take medicines only as directed by your health care provider.  Wear your splint as directed. This will allow your hand to rest and heal. SEEK MEDICAL CARE IF:   Your pain medicine does not help.   Your pain gets worse.  You develop new symptoms. MAKE SURE YOU:   Understand these instructions.  Will watch your condition.  Will get help right away if you are not doing well or get worse.   This information is not intended to replace advice given to you by your health care provider. Make sure you discuss any questions you have with your health care provider.   Document Released: 05/22/2001 Document Revised: 09/17/2014 Document Reviewed: 12/30/2013 Elsevier Interactive Patient Education 2016 [employer].  Try icing 20 minutes three times daily. Could try thumb spica brace/splint if no better in 2-3 weeks.

## 2016-04-10 NOTE — Progress Notes (Signed)
Pre visit review using our clinic review tool, if applicable. No additional management support is needed unless otherwise documented below in the visit note. 

## 2016-04-12 NOTE — Progress Notes (Deleted)
Subjective:   Sara Logan is a 82 y.o. female who presents for Medicare Annual (Subsequent) preventive examination.  Review of Systems:   HRA assessment completed during this visit with   The Patient was informed that the wellness visit is to identify future health risk and educate and initiate measures that can reduce risk for increased disease through the lifespan.    NO ROS; Medicare Wellness Visit Last OV:  11/2015 fup s/p recent IV TPA / January Labs completed: Lipids 11/2015 HDL 48; LDL 75; Trig 119; A1c 5.9 ) Vit D  Stroke Hyperlipidemia (11/2015 lipids HDL 48; LDL 75; trigl 119;  A1c 5.9 ) osteopenia  Never smoked ETOH ; none  Psychosocial: (mother had breast cancer; sister as well )   Medications reviewed for issues; compliance; otc meds  BMI: 34   Diet;  Heart healthy? Fried foods? Sodium: Cholesterol Nutritional counseling given:  Teeth or Denture issues?   Exercise;  active vs sedentary What is the hardest physical activity you have done recently and for how long?   HOME SAFETY reviewed for short term and long term;  Issues reviewed that may potentiate risk include multilevel or inaccessible homes or long term plan?  Personal safety issues reviewed for risk such as safe community; smoke Secondary school teacher; firearms safety if applicable; protection when in the sun; driving safety for seniors or any recent accidents. Fall hx;  Fear of falling?  UA or BOWEL incontinence;  Functional losses from last year to this year?  Given education on "Fall Prevention in the Home" for more safety tips the patient can apply as appropriate.   Risk for Depression reviewed: Any emotional problems? Anxious, depressed, irritable, sad or blue?  Denies feeling depressed or hopeless; voices pleasure in daily life How many social activities have you been engaged in within the last 2 weeks? Who would help you with chores; illness; shopping other? Sleep   Cognitive;  Manages checkbook,  medications; no failures of task Ad8 score reviewed for issues;  Issues making decisions; no  Less interest in hobbies / activities" no  Repeats questions, stories; family complaining: NO  Trouble using ordinary gadgets; microwave; computer: no  Forgets the month or year: no  Mismanaging finances: no  Missing apt: no but does write them down  Daily problems with thinking of memory NO Ad8 score is 0   Advanced Directive addressed; Completed or educated   Counseling Health Maintenance Gaps:  Colonoscopy; 07/2007 EKG: 09/2015 Mammogram:  Dexa/ 01/2014Osteopenia with lowest T score -1.9 at femoral neck PAP: educated regarding the need for GYN exam;      Hearing:  Right ear Left ear Difficulty hearing a whisper Tinnitus; American [employer]inn[employer];   Ophthalmology exam Glaucoma screening Diabetic retinopathy if appropriate   Immunizations Due: (Vaccines reviewed and educated regarding any overdue)  PSV 23 due Tetanus due   Established and updated Risk reviewed and appropriate referral made or health recommendations:  Current Care Team reviewed and updated          Objective:     Vitals: There were no vitals taken for this visit.  There is no height or weight on file to calculate BMI.   Tobacco History  Smoking Status  . Never Smoker  Smokeless Tobacco  . Not on file     Counseling given: Not Answered   Past Medical History:  Diagnosis Date  . Arthritis   . Depression   . Hyperlipidemia   . Osteopenia   . Stroke ([place])   .  Thrombocytopenia ([place])   . Vitamin D deficiency   . Wrist fracture 2002   left   Past Surgical History:  Procedure Laterality Date  . APPENDECTOMY  1967  . CARPAL TUNNEL RELEASE  2008   right  . JOINT REPLACEMENT  2002   right total    Family History  Problem Relation Age of Onset  . Cancer Mother     breast  . Cancer Sister     breast   History  Sexual Activity  . Sexual activity: Not on file     Outpatient Encounter Prescriptions as of 04/13/2016  Medication Sig  . amLODipine (NORVASC) 5 MG tablet TAKE 1 TABLET ONCE DAILY.  Marland Kitchen amoxicillin (AMOXIL) 500 MG capsule For Dental Procedure  . aspirin EC 325 MG EC tablet Take 1 tablet (325 mg total) by mouth daily.  . Biotin 10 MG TABS Take 10 mg by mouth daily.   . Cholecalciferol (VITAMIN D) 2000 UNITS CAPS Take 2,000 Units by mouth daily.   . Probiotic Product (PRO-BIOTIC BLEND PO) Take 1 tablet by mouth daily.   Marland Kitchen Specialty Vitamins Products (MAGNESIUM, AMINO ACID CHELATE,) 133 MG tablet Take 1 tablet by mouth daily.  Marland Kitchen venlafaxine XR (EFFEXOR-XR) 75 MG 24 hr capsule TAKE 2 CAPSULES (150MG ) BY MOUTH DAILY.  . vitamin C (ASCORBIC ACID) 500 MG tablet Take 500 mg by mouth daily.  . vitamin E 100 UNIT capsule Take 100 Units by mouth daily.   No facility-administered encounter medications on file as of 04/13/2016.     Activities of Daily Living In your present state of health, do you have any difficulty performing the following activities: 09/25/2015  Hearing? N  Vision? N  Difficulty concentrating or making decisions? N  Walking or climbing stairs? N  Dressing or bathing? N  Doing errands, shopping? Y  Some recent data might be hidden    Patient Care Team: Eulas Post, MD as PCP - General    Assessment:    *** Exercise Activities and Dietary recommendations    Goals    None     Fall Risk Fall Risk  12/08/2015 03/18/2015 08/07/2013  Falls in the past year? No No No   Depression Screen PHQ 2/9 Scores 03/18/2015 08/07/2013  PHQ - 2 Score 0 0     Cognitive Testing No flowsheet data found.  Immunization History  Administered Date(s) Administered  . Influenza Split 06/18/2012  . Influenza Whole 07/11/2010  . Influenza, High Dose Seasonal PF 07/07/2013, 07/14/2014, 06/24/2015  . Pneumococcal Conjugate-13 04/27/2014  . Td 09/10/2005  . Zoster 11/29/2006   Screening Tests Health Maintenance  Topic Date Due  .  PNA vac Low Risk Adult (2 of 2 - PPSV23) 04/28/2015  . TETANUS/TDAP  09/11/2015  . INFLUENZA VACCINE  04/10/2016  . DEXA SCAN  Completed  . ZOSTAVAX  Completed      Plan:   *** During the course of the visit the patient was educated and counseled about the following appropriate screening and preventive services:   Vaccines to include Pneumoccal, Influenza, Hepatitis B, Td, Zostavax, HCV  Electrocardiogram 09/2015  Cardiovascular Disease/ hx of stroke;   Colorectal cancer screening/ 2008  Bone density screening osteopenia; Vit D supplementation  Diabetes screening/ 5.9 in 2017  Glaucoma screening/   Mammography/PAP 11/2015  Nutrition counseling   Patient Instructions (the written plan) was given to the patient.   Wynetta Fines, RN  04/12/2016

## 2016-04-13 ENCOUNTER — Ambulatory Visit: Payer: Medicare Other

## 2016-05-01 ENCOUNTER — Ambulatory Visit (INDEPENDENT_AMBULATORY_CARE_PROVIDER_SITE_OTHER): Payer: Medicare Other | Admitting: Family Medicine

## 2016-05-01 DIAGNOSIS — M79644 Pain in right finger(s): Secondary | ICD-10-CM | POA: Diagnosis not present

## 2016-05-01 DIAGNOSIS — M778 Other enthesopathies, not elsewhere classified: Secondary | ICD-10-CM

## 2016-05-01 DIAGNOSIS — I639 Cerebral infarction, unspecified: Secondary | ICD-10-CM | POA: Diagnosis not present

## 2016-05-01 MED ORDER — METHYLPREDNISOLONE ACETATE 40 MG/ML IJ SUSP
40.0000 mg | Freq: Once | INTRAMUSCULAR | Status: AC
  Administered 2016-05-01: 40 mg via INTRA_ARTICULAR

## 2016-05-01 MED ORDER — METHYLPREDNISOLONE ACETATE 80 MG/ML IJ SUSP: 40.0000 mg | Freq: Once | INTRAMUSCULAR | Status: DC

## 2016-05-01 NOTE — Progress Notes (Signed)
Subjective:     Patient ID: Sara Logan, female   DOB: 19-May-1933, 82 y.o.   MRN: [id number]  HPI Follow-up right wrist pain. Suspected de Quervain's tenosynovitis. She's tried icing but not very frequently. She did get a thumb spica plan which she wears inconsistently. She has pain with gripping and lifting. No definite weakness. No numbness. Pain is moderate.  Past Medical History:  Diagnosis Date  . Arthritis   . Depression   . Hyperlipidemia   . Osteopenia   . Stroke ([place])   . Thrombocytopenia ([place])   . Vitamin D deficiency   . Wrist fracture 2002   left   Past Surgical History:  Procedure Laterality Date  . APPENDECTOMY  1967  . CARPAL TUNNEL RELEASE  2008   right  . JOINT REPLACEMENT  2002   right total     reports that she has never smoked. She does not have any smokeless tobacco history on file. She reports that she does not drink alcohol or use drugs. family history includes Breast cancer in her mother and sister. Allergies  Allergen Reactions  . Bactrim [Sulfamethoxazole-Trimethoprim] Nausea Only     Review of Systems  Musculoskeletal: Negative for neck pain.  Neurological: Negative for weakness and numbness.       Objective:   Physical Exam  Constitutional: She appears well-developed and well-nourished.  Cardiovascular: Normal rate and regular rhythm.   Musculoskeletal:  Right wrist full range of motion. No erythema. No warmth. No ecchymosis. She has some tenderness along the abductor tendon right thumb.       Assessment:     Tendinitis of right wrist    Plan:     We discussed risk and benefits of steroid injection right wrist. We discussed risk of bleeding, bruising, infection. Patient consented. Using 25-gauge needle injected 1/2 mL of Depo-Medrol into the tendon sheath of the abductor tendon right thumb. Patient tolerated well. Continue icing. Continue splint as necessary. Touch base in 2 weeks if not improved  Eulas Post MD [place]  [facility] at [facility] -

## 2016-05-01 NOTE — Patient Instructions (Signed)
Continue with icing 2-3 times daily.

## 2016-05-01 NOTE — Addendum Note (Signed)
Addended by: Elio Forget on: 05/01/2016 11:24 AM   Modules accepted: Orders

## 2016-05-01 NOTE — Progress Notes (Signed)
Pre visit review using our clinic review tool, if applicable. No additional management support is needed unless otherwise documented below in the visit note. 

## 2016-05-02 ENCOUNTER — Encounter: Payer: Self-pay | Admitting: Gastroenterology

## 2016-06-06 ENCOUNTER — Encounter (HOSPITAL_COMMUNITY): Payer: Self-pay | Admitting: *Deleted

## 2016-06-06 ENCOUNTER — Emergency Department (HOSPITAL_COMMUNITY): Payer: Medicare Other

## 2016-06-06 ENCOUNTER — Observation Stay (HOSPITAL_COMMUNITY)
Admission: EM | Admit: 2016-06-06 | Discharge: 2016-06-07 | Disposition: A | Discharge status: Home / Self Care | Payer: Medicare Other | Attending: Internal Medicine | Admitting: Internal Medicine

## 2016-06-06 DIAGNOSIS — R42 Dizziness and giddiness: Secondary | ICD-10-CM | POA: Diagnosis not present

## 2016-06-06 DIAGNOSIS — E785 Hyperlipidemia, unspecified: Secondary | ICD-10-CM

## 2016-06-06 DIAGNOSIS — I639 Cerebral infarction, unspecified: Secondary | ICD-10-CM | POA: Diagnosis present

## 2016-06-06 DIAGNOSIS — Z8673 Personal history of transient ischemic attack (TIA), and cerebral infarction without residual deficits: Secondary | ICD-10-CM | POA: Insufficient documentation

## 2016-06-06 DIAGNOSIS — F339 Major depressive disorder, recurrent, unspecified: Secondary | ICD-10-CM | POA: Diagnosis present

## 2016-06-06 DIAGNOSIS — I10 Essential (primary) hypertension: Secondary | ICD-10-CM | POA: Diagnosis not present

## 2016-06-06 DIAGNOSIS — R112 Nausea with vomiting, unspecified: Secondary | ICD-10-CM

## 2016-06-06 DIAGNOSIS — R404 Transient alteration of awareness: Secondary | ICD-10-CM | POA: Diagnosis not present

## 2016-06-06 DIAGNOSIS — F329 Major depressive disorder, single episode, unspecified: Secondary | ICD-10-CM

## 2016-06-06 DIAGNOSIS — Z7982 Long term (current) use of aspirin: Secondary | ICD-10-CM | POA: Insufficient documentation

## 2016-06-06 DIAGNOSIS — E669 Obesity, unspecified: Secondary | ICD-10-CM | POA: Insufficient documentation

## 2016-06-06 DIAGNOSIS — E860 Dehydration: Secondary | ICD-10-CM | POA: Insufficient documentation

## 2016-06-06 DIAGNOSIS — R0600 Dyspnea, unspecified: Secondary | ICD-10-CM | POA: Diagnosis not present

## 2016-06-06 DIAGNOSIS — Z6834 Body mass index (BMI) 34.0-34.9, adult: Secondary | ICD-10-CM | POA: Diagnosis not present

## 2016-06-06 DIAGNOSIS — R0602 Shortness of breath: Secondary | ICD-10-CM | POA: Diagnosis not present

## 2016-06-06 DIAGNOSIS — R55 Syncope and collapse: Secondary | ICD-10-CM | POA: Diagnosis not present

## 2016-06-06 DIAGNOSIS — Z79899 Other long term (current) drug therapy: Secondary | ICD-10-CM | POA: Diagnosis not present

## 2016-06-06 LAB — CBC WITH DIFFERENTIAL/PLATELET
Basophils Absolute: 0 10*3/uL (ref 0.0–0.1)
Basophils Relative: 0 %
Eosinophils Absolute: 0 10*3/uL (ref 0.0–0.7)
Eosinophils Relative: 0 %
HCT: 38.9 % (ref 36.0–46.0)
Hemoglobin: 12.7 g/dL (ref 12.0–15.0)
Lymphocytes Relative: 8 %
Lymphs Abs: 0.4 10*3/uL — ABNORMAL LOW (ref 0.7–4.0)
MCH: 29.5 pg (ref 26.0–34.0)
MCHC: 32.6 g/dL (ref 30.0–36.0)
MCV: 90.5 fL (ref 78.0–100.0)
Monocytes Absolute: 0.4 10*3/uL (ref 0.1–1.0)
Monocytes Relative: 8 %
Neutro Abs: 4.6 10*3/uL (ref 1.7–7.7)
Neutrophils Relative %: 84 %
Platelets: 104 10*3/uL — ABNORMAL LOW (ref 150–400)
RBC: 4.3 MIL/uL (ref 3.87–5.11)
RDW: 13.6 % (ref 11.5–15.5)
WBC: 5.5 10*3/uL (ref 4.0–10.5)

## 2016-06-06 LAB — URINALYSIS, ROUTINE W REFLEX MICROSCOPIC
Bilirubin Urine: NEGATIVE
Glucose, UA: NEGATIVE mg/dL
Ketones, ur: NEGATIVE mg/dL
Nitrite: NEGATIVE
Protein, ur: NEGATIVE mg/dL
Specific Gravity, Urine: 1.026 (ref 1.005–1.030)
pH: 6 (ref 5.0–8.0)

## 2016-06-06 LAB — COMPREHENSIVE METABOLIC PANEL
ALT: 21 U/L (ref 14–54)
AST: 24 U/L (ref 15–41)
Albumin: 3.5 g/dL (ref 3.5–5.0)
Alkaline Phosphatase: 59 U/L (ref 38–126)
Anion gap: 6 (ref 5–15)
BUN: 17 mg/dL (ref 6–20)
CO2: 27 mmol/L (ref 22–32)
Calcium: 9.3 mg/dL (ref 8.9–10.3)
Chloride: 105 mmol/L (ref 101–111)
Creatinine, Ser: 0.77 mg/dL (ref 0.44–1.00)
GFR calc Af Amer: >60 mL/min (ref >60)
GFR calc non Af Amer: >60 mL/min (ref >60)
Glucose, Bld: 116 mg/dL — ABNORMAL HIGH (ref 65–99)
Potassium: 3.8 mmol/L (ref 3.5–5.1)
Sodium: 138 mmol/L (ref 135–145)
Total Bilirubin: 0.8 mg/dL (ref 0.3–1.2)
Total Protein: 6.7 g/dL (ref 6.5–8.1)

## 2016-06-06 LAB — URINE MICROSCOPIC-ADD ON

## 2016-06-06 LAB — I-STAT TROPONIN, ED
Troponin i, poc: 0.01 ng/mL (ref 0.00–0.08)
Troponin i, poc: 0 ng/mL (ref 0.00–0.08)

## 2016-06-06 MED ORDER — VITAMIN C 500 MG PO TABS
500.0000 mg | ORAL_TABLET | Freq: Every day | ORAL | Status: DC
  Administered 2016-06-07: 500 mg via ORAL
  Filled 2016-06-06: qty 1

## 2016-06-06 MED ORDER — MAGNESIUM OXIDE 400 (241.3 MG) MG PO TABS
400.0000 mg | ORAL_TABLET | Freq: Every day | ORAL | Status: DC
  Administered 2016-06-07: 400 mg via ORAL
  Filled 2016-06-06: qty 1

## 2016-06-06 MED ORDER — AMLODIPINE BESYLATE 5 MG PO TABS
5.0000 mg | ORAL_TABLET | Freq: Every day | ORAL | Status: DC
  Administered 2016-06-07: 5 mg via ORAL
  Filled 2016-06-06: qty 1

## 2016-06-06 MED ORDER — BIOTIN 10 MG PO TABS: 10.0000 mg | ORAL_TABLET | Freq: Every day | ORAL | Status: DC

## 2016-06-06 MED ORDER — ALBUTEROL SULFATE (2.5 MG/3ML) 0.083% IN NEBU: 2.5000 mg | INHALATION_SOLUTION | RESPIRATORY_TRACT | Status: DC | PRN

## 2016-06-06 MED ORDER — SODIUM CHLORIDE 0.9 % IV BOLUS (SEPSIS)
1000.0000 mL | Freq: Once | INTRAVENOUS | Status: AC
  Administered 2016-06-06: 1000 mL via INTRAVENOUS

## 2016-06-06 MED ORDER — ASPIRIN EC 325 MG PO TBEC
325.0000 mg | DELAYED_RELEASE_TABLET | Freq: Every day | ORAL | Status: DC
  Administered 2016-06-07: 325 mg via ORAL
  Filled 2016-06-06: qty 1

## 2016-06-06 MED ORDER — ENOXAPARIN SODIUM 40 MG/0.4ML ~~LOC~~ SOLN
40.0000 mg | SUBCUTANEOUS | Status: DC
  Administered 2016-06-07: 40 mg via SUBCUTANEOUS
  Filled 2016-06-06: qty 0.4

## 2016-06-06 MED ORDER — GUAIFENESIN ER 600 MG PO TB12
600.0000 mg | ORAL_TABLET | Freq: Two times a day (BID) | ORAL | Status: DC
  Filled 2016-06-06 (×2): qty 1

## 2016-06-06 MED ORDER — VENLAFAXINE HCL ER 75 MG PO CP24
75.0000 mg | ORAL_CAPSULE | Freq: Every day | ORAL | Status: DC
  Administered 2016-06-07: 75 mg via ORAL
  Filled 2016-06-06: qty 1

## 2016-06-06 MED ORDER — VITAMIN D 1000 UNITS PO TABS
2000.0000 [IU] | ORAL_TABLET | Freq: Every day | ORAL | Status: DC
  Administered 2016-06-07: 2000 [IU] via ORAL
  Filled 2016-06-06: qty 2

## 2016-06-06 MED ORDER — VITAMIN E 45 MG (100 UNIT) PO CAPS
100.0000 [IU] | ORAL_CAPSULE | Freq: Every day | ORAL | Status: DC
  Administered 2016-06-07: 100 [IU] via ORAL
  Filled 2016-06-06: qty 1

## 2016-06-06 NOTE — ED Provider Notes (Signed)
Emergency Department Provider Note   I have reviewed the triage vital signs and the nursing notes.   HISTORY  Chief Complaint Nausea and Dizziness   HPI Sara Logan is a 82 y.o. female with PMH of HLD, CVA, and thrombocytopenia presents to the ED for evaluation of near-syncope, nausea, and vomiting. Patient states that she was running errands today and was feeling generally well with the exception of some mild rhinorrhea and mild body aches. Patient states that after finishing at the grocery store she began to feel nauseated on the drive home. She denies any associated chest pain or dyspnea. No fevers today. Patient states that she returns home and began walking inside when she felt a strong urge to vomit and lowered herself to the ground. She denies any loss of consciousness. She felt very lightheaded at the time and was unable to get up on her own. She denies associated vertigo unilateral weakness or numbness. Assistance arrived on scene and EMS was called. The patient received 8 mg of Zofran with EMS and is feeling slightly better.    Past Medical History:  Diagnosis Date  . Arthritis   . Depression   . Hyperlipidemia   . Osteopenia   . Stroke ([place])   . Thrombocytopenia ([place])   . Vitamin D deficiency   . Wrist fracture 2002   left    Patient Active Problem List   Diagnosis Date Noted  . Near syncope 06/06/2016  . Essential hypertension 10/27/2015  . 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 History of normocytic normochromic anemia 03/18/2015  . Obesity (BMI 30-39.9) 08/07/2013  . Urinary urgency 03/19/2013  . Osteopenia 08/27/2012  . Venous stasis 02/06/2012  . Rosacea 07/05/2011  . Vitamin D deficiency 12/08/2010  . Hyperlipidemia LDL goal <70 09/08/2010  . Depression 09/08/2010    Past Surgical History:  Procedure Laterality Date  . APPENDECTOMY  1967  . CARPAL TUNNEL RELEASE  2008   right  . JOINT REPLACEMENT  2002   right total     Current  Outpatient Rx  . Order #: [id number] Class: Normal  . Order #: [id number] Class: Historical Med  . Order #: [id number] Class: Normal  . Order #: [id number] Class: Historical Med  . Order #: [id number] Class: Historical Med  . Order #: [id number] Class: Historical Med  . Order #: [id number] Class: Normal  . Order #: [id number] Class: Historical Med  . Order #: [id number] Class: Historical Med    Allergies Bactrim [sulfamethoxazole-trimethoprim]  Family History  Problem Relation Age of Onset  . Breast cancer Mother   . Breast cancer Sister     Social History Social History  Substance Use Topics  . Smoking status: Never Smoker  . Smokeless tobacco: Never Used  . Alcohol use No    Review of Systems  Constitutional: No fever/chills. Positive mild body aches and near-syncope.  Eyes: No visual changes. ENT: No sore throat. Cardiovascular: Denies chest pain. Respiratory: Denies shortness of breath. Gastrointestinal: No abdominal pain. Positive nausea, no vomiting.  No diarrhea.  No constipation. Genitourinary: Negative for dysuria. Musculoskeletal: Negative for back pain. Skin: Negative for rash. Neurological: Negative for headaches, focal weakness or numbness.  10-point ROS otherwise negative.  ____________________________________________   PHYSICAL EXAM:  VITAL SIGNS:  Resp: 18 SpO2: 96% Pulse: 99 BP: 155/80  Constitutional: Alert and oriented. Well appearing and in no acute distress. Eyes: Conjunctivae are normal.  Head: Atraumatic. Nose: No congestion/rhinnorhea. Mouth/Throat: Mucous membranes are moist.  Oropharynx non-erythematous. Neck: No stridor.  Cardiovascular: Normal rate, regular rhythm. Good peripheral circulation. Grossly normal heart sounds.   Respiratory: Normal respiratory effort.  No retractions. Lungs CTAB. Gastrointestinal: Soft and nontender. No distention.  Musculoskeletal: No lower extremity tenderness nor edema. No gross deformities of  extremities. Neurologic:  Normal speech and language. No gross focal neurologic deficits are appreciated.  Skin:  Skin is warm, dry and intact. No rash noted. Psychiatric: Mood and affect are normal. Speech and behavior are normal.  ____________________________________________   LABS (all labs ordered are listed, but only abnormal results are displayed)  Labs Reviewed  COMPREHENSIVE METABOLIC PANEL - Abnormal; Notable for the following:       Result Value   Glucose, Bld 116 (*)    All other components within normal limits  CBC WITH DIFFERENTIAL/PLATELET - Abnormal; Notable for the following:    Platelets 104 (*)    Lymphs Abs 0.4 (*)    All other components within normal limits  URINALYSIS, ROUTINE W REFLEX MICROSCOPIC (NOT AT [facility]) - Abnormal; Notable for the following:    APPearance CLOUDY (*)    Hgb urine dipstick SMALL (*)    Leukocytes, UA TRACE (*)    All other components within normal limits  URINE MICROSCOPIC-ADD ON - Abnormal; Notable for the following:    Squamous Epithelial / LPF 0-5 (*)    Bacteria, UA MANY (*)    All other components within normal limits  I-STAT TROPOININ, ED  I-STAT TROPOININ, ED   ____________________________________________  EKG   EKG Interpretation  Date/Time:  Wednesday June 06 2016 17:59:55 EDT Ventricular Rate:  100 PR Interval:    QRS Duration: 142 QT Interval:  372 QTC Calculation: 480 R Axis:   16 Text Interpretation:  Sinus tachycardia Right bundle branch block Inferior infarct, acute Lateral leads are also involved No STEMI. Similar to prior.  Confirmed by Coe Angelos MD, Ramonia Mcclaran [phone]) on 06/06/2016 6:05:05 PM       ____________________________________________  RADIOLOGY  Dg Chest 2 View  Result Date: 06/06/2016 CLINICAL DATA:  82 year old with acute onset of shortness of breath, dizziness and nausea. EXAM: CHEST  2 VIEW COMPARISON:  04/14/2014, 05/22/2012 and 01/26/2005. FINDINGS: AP semi-erect and lateral images were  obtained. Suboptimal inspiration accounts for crowded bronchovascular markings, especially in the bases, and accentuates the cardiac silhouette. Taking this into account, cardiac silhouette upper normal in size for the AP technique. Thoracic aorta tortuous and atherosclerotic, unchanged. Hilar and mediastinal contours otherwise unremarkable. Prominent bronchovascular markings diffusely and mild central peribronchial thickening, more so than on the prior examination. Lungs otherwise clear. No localized airspace consolidation. No pleural effusions. No pneumothorax. Normal pulmonary vascularity. Exaggeration of the usual thoracic kyphosis, osseous demineralization and degenerative changes throughout the thoracic spine. IMPRESSION: 1. Suboptimal inspiration. Mild changes of acute bronchitis and/or asthma without focal airspace pneumonia. 2. Thoracic aortic atherosclerosis. Electronically Signed   By: Evangeline Dakin M.D.   On: 06/06/2016 19:31    ____________________________________________   PROCEDURES  Procedure(s) performed:   Procedures  None ____________________________________________   INITIAL IMPRESSION / ASSESSMENT AND PLAN / ED COURSE  Pertinent labs & imaging results that were available during my care of the patient were reviewed by me and considered in my medical decision making (see chart for details).  The patient presents to the emergency department for evaluation of sudden onset nausea, vomiting, lightheadedness, and near syncope. No preceding her palpitations, chest pain, dyspnea. Patient has improved nausea after Zofran given by EMS. I reviewed her EKG. She has an existing right  bundle branch block which is unchanged today. No evidence of acute ischemia on EKG. Plan for IV fluids and labs to rule out atypical ACS presentation. No evidence of head or neck trauma to necessitate further imaging. The patient is on full dose aspirin after stroke earlier this year.   10:41 PM Patient  is persistently tachycardic and orthostatic after IV fluids. Low suspicion for ACS. Second troponin pending. She has had ginger ale to drink. Plan for admission for continued IV fluids and workup of near syncope.   Discussed patient's case with hospitalist, Dr. Tamala Julian.  Recommend admission to observation, telemetry bed.  I will place holding orders per their request. Patient and family (if present) updated with plan. Care transferred to hospitalist service.  I reviewed all nursing notes, vitals, pertinent old records, EKGs, labs, imaging (as available). ____________________________________________  FINAL CLINICAL IMPRESSION(S) / ED DIAGNOSES  Final diagnoses:  Near syncope  Dehydration     MEDICATIONS GIVEN DURING THIS VISIT:  Medications  sodium chloride 0.9 % bolus 1,000 mL (1,000 mLs Intravenous New Bag/Given 06/06/16 1840)     NEW OUTPATIENT MEDICATIONS STARTED DURING THIS VISIT:  None   Note:  This document was prepared using Dragon voice recognition software and may include unintentional dictation errors.  Nanda Quinton, MD Emergency Medicine   Margette Fast, MD 06/06/16 [phone]

## 2016-06-06 NOTE — ED Triage Notes (Signed)
Patient comes in with c/o dizziness and nausea. Fell today on grass when she became dizzy and nauseous. Vomited x4 in the last 24 hours. LBM this AM. + flatus. Denies h/a. No slurred speech. No weakness or drift in extremities noted. No c/o visual loss. Patient is not on blood thinners. Hx of CVA and HTN. IV access 20 LAC. EMS gave 8 of zofran. Temp 100.0. 141/76, 119, 104. RBBB on EMS EKG.

## 2016-06-06 NOTE — H&P (Addendum)
**Note Sara via Obfuscation** History and Physical    [name] [id number] DOB: 07/14/1933 DOA: 06/06/2016  Referring MD/NP/PA: Dr. Laverta Baltimore PCP: Eulas Post, MD  Logan coming from: [facility]  Chief Complaint: Near syncope  HPI: Sara Logan is a 82 y.o. 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 Logan is a adequate historian, at baseline is able to complete all ADLs, and doesn't use any assistive device to ambulate across. She states that she had a busy morning running errands. Reports having breakfast and lunch where she lives. This afternoon she went to the hospice facility to have a TB test so that she can start volunteering. Thereafter, she drove to [employer] to pick up some groceries. She finished grocery shopping and return to her car noting that it was very hot inside the car. During the drive home she started to feel dizzy, nauseated, and had some dry heaves. After she made at home she got out of the car, but was lightheaded. She walked a short distance and began vomiting. She lowered herself to the ground. Denies any trauma or loss of consciousness. However, she was unable to get herself back up on her own and had to call for help. Admits that she may not have hydrated herself as well as she normally does. Associated symptoms included some mild shortness of breath, intermittent nonproductive cough, and generalized weakness. Denies any chest pain, wheezing, palpitations, dysuria, fever, chills, recent medication changes, or recent sick contacts. Logan notes that she had a stroke back in January but has no residual deficits. Also, she expressed some left eye pain yesterday that resolved with Tylenol. EMS was called and Logan was given 8 mg of Zofran enroute.  ED Course: Upon admission into the emergency department the Logan was seen to have heart rates in the  100s, respirations up to 28, Orthostatic vital signs were positive, and O2 saturations within normal limits.  Lab work was unremarkable. Initial troponin x 2 were negative. UA was positive for many bacteria, trace leukocytes, negative nitrites, 0-5 squamous epithelial cells, 0-5 WBCs, and specific gravity 1.026. Logan was given 1 L of IV fluids. TRH called to admit for observation.  Review of Systems: As per HPI otherwise 10 point review of systems negative.   Past Medical History:  Diagnosis Date  . Arthritis   . Depression   . Hyperlipidemia   . Osteopenia   . Stroke ([place])   . Thrombocytopenia ([place])   . Vitamin D deficiency   . Wrist fracture 2002   left    Past Surgical History:  Procedure Laterality Date  . APPENDECTOMY  1967  . CARPAL TUNNEL RELEASE  2008   right  . JOINT REPLACEMENT  2002   right total      reports that she has never smoked. She has never used smokeless tobacco. She reports that she does not drink alcohol or use drugs.  Allergies  Allergen Reactions  . Bactrim [Sulfamethoxazole-Trimethoprim] Nausea Only    Family History  Problem Relation Age of Onset  . Breast cancer Mother   . Breast cancer Sister     Prior to Admission medications   Medication Sig Start Date End Date Taking? Authorizing Provider  amLODipine (NORVASC) 5 MG tablet TAKE 1 TABLET ONCE DAILY. Logan taking differently: TAKE 1 TABLET ONCE DAILY IN EVENING 03/19/16  Yes Eulas Post, MD  amoxicillin (AMOXIL) 500 MG capsule  Take 2,000 mg by mouth once. FOR DENTAL 06/02/16  Yes Historical Provider, MD  aspirin EC 325 MG EC tablet Take 1 tablet (325 mg total) by mouth daily. 09/27/15  Yes Donzetta Starch, NP  Biotin 10 MG TABS Take 10 mg by mouth daily.    Yes Historical Provider, MD  Cholecalciferol (VITAMIN D) 2000 UNITS CAPS Take 2,000 Units by mouth daily.    Yes Historical Provider, MD  Specialty Vitamins Products (MAGNESIUM, AMINO ACID CHELATE,) 133 MG tablet Take 1 tablet by  mouth daily.   Yes Historical Provider, MD  venlafaxine XR (EFFEXOR-XR) 75 MG 24 hr capsule TAKE 2 CAPSULES (150MG ) BY MOUTH DAILY. 11/25/15  Yes Eulas Post, MD  vitamin C (ASCORBIC ACID) 500 MG tablet Take 500 mg by mouth daily.   Yes Historical Provider, MD  vitamin E 100 UNIT capsule Take 100 Units by mouth daily.   Yes Historical Provider, MD    Physical Exam:    Constitutional: Obese elderly female in NAD, calm, comfortable Vitals:   06/06/16 2045 06/06/16 2130 06/06/16 2215 06/06/16 2230  BP: 140/74 144/76 161/77 152/75  Pulse: 99 98 96 97  Resp: 18     SpO2: 94%  94% 91%  Weight:      Height:       Eyes: PERRL, lids and conjunctivae normal ENMT: Mucous membranes are moist. Posterior pharynx clear of any exudate or lesions. Normal dentition.  Neck: normal, supple, no masses, no thyromegaly Respiratory: clear to auscultation bilaterally, no wheezing, no crackles. Normal respiratory effort. No accessory muscle use.  Cardiovascular: Regular rate and rhythm, no murmurs / rubs / gallops. Trace lower extremity edema. 2+ pedal pulses. No carotid bruits.  Abdomen: no tenderness, no masses palpated. No hepatosplenomegaly. Bowel sounds positive.  Musculoskeletal: no clubbing / cyanosis. No joint deformity upper and lower extremities. Good ROM, no contractures. Normal muscle tone.  Skin: rosacea of face, no lesions or ulcers. No induration Neurologic: CN 2-12 grossly intact. Sensation intact, DTR normal. Strength 5/5 in all 4.  Psychiatric: Normal judgment and insight. Alert and oriented x 3. Normal mood.     Labs on Admission: I have personally reviewed following labs and imaging studies  CBC:  Recent Labs Lab 06/06/16 1912  WBC 5.5  NEUTROABS 4.6  HGB 12.7  HCT 38.9  MCV 90.5  PLT 123456*   Basic Metabolic Panel:  Recent Labs Lab 06/06/16 1912  NA 138  K 3.8  CL 105  CO2 27  GLUCOSE 116*  BUN 17  CREATININE 0.77  CALCIUM 9.3   GFR: Estimated Creatinine  Clearance: 57.4 mL/min (by C-G formula based on SCr of 0.77 mg/dL). Liver Function Tests:  Recent Labs Lab 06/06/16 1912  AST 24  ALT 21  ALKPHOS 59  BILITOT 0.8  PROT 6.7  ALBUMIN 3.5   No results for input(s): LIPASE, AMYLASE in the last 168 hours. No results for input(s): AMMONIA in the last 168 hours. Coagulation Profile: No results for input(s): INR, PROTIME in the last 168 hours. Cardiac Enzymes: No results for input(s): CKTOTAL, CKMB, CKMBINDEX, TROPONINI in the last 168 hours. BNP (last 3 results) No results for input(s): PROBNP in the last 8760 hours. HbA1C: No results for input(s): HGBA1C in the last 72 hours. CBG: No results for input(s): GLUCAP in the last 168 hours. Lipid Profile: No results for input(s): CHOL, HDL, LDLCALC, TRIG, CHOLHDL, LDLDIRECT in the last 72 hours. Thyroid Function Tests: No results for input(s): TSH, T4TOTAL, FREET4, T3FREE, THYROIDAB  in the last 72 hours. Anemia Panel: No results for input(s): VITAMINB12, FOLATE, FERRITIN, TIBC, IRON, RETICCTPCT in the last 72 hours. Urine analysis:    Component Value Date/Time   COLORURINE YELLOW 06/06/2016 2005   APPEARANCEUR CLOUDY (A) 06/06/2016 2005   LABSPEC 1.026 06/06/2016 2005   PHURINE 6.0 06/06/2016 2005   GLUCOSEU NEGATIVE 06/06/2016 2005   GLUCOSEU NEGATIVE 04/23/2014 1207   HGBUR SMALL (A) 06/06/2016 2005   BILIRUBINUR NEGATIVE 06/06/2016 2005   BILIRUBINUR Neg 03/15/2016 [place] 06/06/2016 2005   PROTEINUR NEGATIVE 06/06/2016 2005   UROBILINOGEN 0.2 03/15/2016 1549   UROBILINOGEN 0.2 04/23/2014 1207   NITRITE NEGATIVE 06/06/2016 2005   LEUKOCYTESUR TRACE (A) 06/06/2016 2005   Sepsis Labs: No results found for this or any previous visit (from the past 240 hour(s)).   Radiological Exams on Admission: Dg Chest 2 View  Result Date: 06/06/2016 CLINICAL DATA:  82 year old with acute onset of shortness of breath, dizziness and nausea. EXAM: CHEST  2 VIEW  COMPARISON:  04/14/2014, 05/22/2012 and 01/26/2005. FINDINGS: AP semi-erect and lateral images were obtained. Suboptimal inspiration accounts for crowded bronchovascular markings, especially in the bases, and accentuates the cardiac silhouette. Taking this into account, cardiac silhouette upper normal in size for the AP technique. Thoracic aorta tortuous and atherosclerotic, unchanged. Hilar and mediastinal contours otherwise unremarkable. Prominent bronchovascular markings diffusely and mild central peribronchial thickening, more so than on the prior examination. Lungs otherwise clear. No localized airspace consolidation. No pleural effusions. No pneumothorax. Normal pulmonary vascularity. Exaggeration of the usual thoracic kyphosis, osseous demineralization and degenerative changes throughout the thoracic spine. IMPRESSION: 1. Suboptimal inspiration. Mild changes of acute bronchitis and/or asthma without focal airspace pneumonia. 2. Thoracic aortic atherosclerosis. Electronically Signed   By: Evangeline Dakin M.D.   On: 06/06/2016 19:31    EKG: Independently reviewed. Sinus tachycardia at 100 bpm. Similar to previous tracing  Assessment/Plan Near syncope/orthostatic hypotension/dehydration: Acute. Logan nearly syncopal episode found to be orthostatic on admission. Also, found to have an elevated BUN to creatinine ratio suggesting dehydration along with upper limit of normal urine specific gravity . Given 1 L of IV fluids in the ED - admit to a telemetry bed - IV fluids NS at 75 ml/hr - recheck orthostatic vital signs in a.m. - Up with assistance - Follow-up telemetry overnight   Nausea and vomiting: Acute. Cause unknown at this time question possible gastroenteritis versus - zofran prn n/v - Advance diet as tolerated to heart healthy  Dyspnea: Now resolved. Logan denies any significant history of allergies. Physical exam reveals no wheezing. Chest x-ray showing signs of acute bronchitis or  asthma without focal airspace disease. - Albuterol prn sob/wheezzing - Trial of Mucinex as Logan reports dry cough   Abnormal UA: UA was positive for many bacteria, trace leukocytes, negative nitrites, 0-5 squamous epithelial cells, 0-5 WBCs, and specific gravity 1.026. - check Urine culture  Essential hypertension -  continue amlodipine   History of CVA s/p IV TPA in 09/2015 - Continue aspirin 325mg   Hyperlipidemia: Logan reports stopping previous statin secondary to muscle aches. - Logan may benefit from being tried on a different statin by PCP in the outpatient setting   Depression - Continue Effexor   DVT prophylaxis: Lovenox  Code Status: Full  Family Communication: Discussed plan with the Logan and friends present at bedside Disposition Plan: Discharge home in am if stable Consults called: none Admission status: Obs  telemetry Norval Morton MD [facility] Pager [phone]  If 7PM-7AM, please contact night-coverage [email or web address] Password [email or web address]  06/06/2016, 11:00 PM

## 2016-06-06 NOTE — ED Notes (Signed)
Pt ambulated self efficiently to restroom with no difficulty. Minimal assist.

## 2016-06-07 DIAGNOSIS — E860 Dehydration: Secondary | ICD-10-CM | POA: Diagnosis not present

## 2016-06-07 DIAGNOSIS — R55 Syncope and collapse: Secondary | ICD-10-CM | POA: Diagnosis not present

## 2016-06-07 DIAGNOSIS — I10 Essential (primary) hypertension: Secondary | ICD-10-CM | POA: Diagnosis not present

## 2016-06-07 LAB — CBC
HCT: 38.3 % (ref 36.0–46.0)
Hemoglobin: 12.5 g/dL (ref 12.0–15.0)
MCH: 29.4 pg (ref 26.0–34.0)
MCHC: 32.6 g/dL (ref 30.0–36.0)
MCV: 90.1 fL (ref 78.0–100.0)
Platelets: 93 10*3/uL — ABNORMAL LOW (ref 150–400)
RBC: 4.25 MIL/uL (ref 3.87–5.11)
RDW: 13.6 % (ref 11.5–15.5)
WBC: 4.3 10*3/uL (ref 4.0–10.5)

## 2016-06-07 LAB — BASIC METABOLIC PANEL
Anion gap: 6 (ref 5–15)
BUN: 10 mg/dL (ref 6–20)
CO2: 26 mmol/L (ref 22–32)
Calcium: 8.9 mg/dL (ref 8.9–10.3)
Chloride: 107 mmol/L (ref 101–111)
Creatinine, Ser: 0.67 mg/dL (ref 0.44–1.00)
GFR calc Af Amer: >60 mL/min (ref >60)
GFR calc non Af Amer: >60 mL/min (ref >60)
Glucose, Bld: 109 mg/dL — ABNORMAL HIGH (ref 65–99)
Potassium: 3.3 mmol/L — ABNORMAL LOW (ref 3.5–5.1)
Sodium: 139 mmol/L (ref 135–145)

## 2016-06-07 LAB — MRSA PCR SCREENING: MRSA by PCR: NEGATIVE

## 2016-06-07 MED ORDER — ACETAMINOPHEN 325 MG PO TABS
650.0000 mg | ORAL_TABLET | Freq: Four times a day (QID) | ORAL | Status: DC | PRN
  Administered 2016-06-07: 650 mg via ORAL
  Filled 2016-06-07 (×2): qty 2

## 2016-06-07 MED ORDER — SODIUM CHLORIDE 0.9 % IV SOLN
INTRAVENOUS | Status: DC
  Administered 2016-06-07: 02:00:00 via INTRAVENOUS

## 2016-06-07 MED ORDER — POTASSIUM CHLORIDE CRYS ER 20 MEQ PO TBCR
40.0000 meq | EXTENDED_RELEASE_TABLET | Freq: Once | ORAL | Status: AC
  Administered 2016-06-07: 40 meq via ORAL
  Filled 2016-06-07: qty 2

## 2016-06-07 NOTE — Care Management Obs Status (Signed)
[facility] NOTIFICATION   Patient Details  Name: Sara Logan MRN: [id number] Date of Birth: Nov 04, 1932   Medicare Observation Status Notification Given:       Norina Buzzard, RN 06/07/2016, 11:47 AM

## 2016-06-07 NOTE — Discharge Summary (Signed)
Physician Discharge Summary  Sara Logan [id number] DOB: Jun 04, 1933 DOA: 06/06/2016  PCP: Sara Post, MD  Admit date: 06/06/2016 Discharge date: 06/07/2016  Time spent: 35 minutes  Recommendations for Outpatient Follow-up:  1.  Sara Logan presented with near syncope, improved with IV fluids.  2. She was discharged to her independent living facility   Discharge Diagnoses:  Principal Problem:   Near syncope Active Problems:   Hyperlipidemia LDL goal <70   Depression   aborted L brain stroke s/p IV tPA   Essential hypertension   Nausea and vomiting   Dyspnea   Discharge Condition: Stable  Diet recommendation: Regular diet  Filed Weights   06/06/16 1802 06/07/16 0007  Weight: 86.2 kg (190 lb) 88.7 kg (195 lb 9.6 oz)    History of present illness:  Sara Logan is a 82 y.o. 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 Patient is a adequate historian, at baseline is able to complete all ADLs, and doesn't use any assistive device to ambulate across. She states that she had a busy morning running errands. Reports having breakfast and lunch where she lives. This afternoon she went to the hospice facility to have a TB test so that she can start volunteering. Thereafter, she drove to [employer] to pick up some groceries. She finished grocery shopping and return to her car noting that it was very hot inside the car. During the drive home she started to feel dizzy, nauseated, and had some dry heaves. After she made at home she got out of the car, but was lightheaded. She walked a short distance and began vomiting. She lowered herself to the ground. Denies any trauma or loss of consciousness. However, she was unable to get herself back up on her own and had to call for help. Admits that she may not have hydrated herself as well as she normally does.  Associated symptoms included some mild shortness of breath, intermittent nonproductive cough, and generalized weakness. Denies any chest pain, wheezing, palpitations, dysuria, fever, chills, recent medication changes, or recent sick contacts. Patient notes that she had a stroke back in January but has no residual deficits. Also, she expressed some left eye pain yesterday that resolved with Tylenol. EMS was called and patient was given 8 mg of Zofran enroute.  Hospital Course:  Sara Logan is a pleasant 82 year old with a past history of hypertension, dyslipidemia, CVA, admitted to the medicine service on 06/06/2016 when she presented with complaints of dizziness, lightheadedness while driving. Fortunately she did not have loss of consciousness. She denied focal neurological deficits, chest pain, fevers, chills. Initial workup unremarkable, white count within normal range at 5,500. She was started on IV fluids and placed in overnight observation and monitored. By the following morning she reported feeling much better. She ambulated down the hallway to the nurse's station without requiring a walker. Denied further episodes of dizziness and lightheadedness. I suspect that symptoms could be related to dehydration given improvement with IV fluid administration. She expressed wishes to be discharged on the following day stating she was back to her baseline. She was discharged to her independent living facility on 05/30/2016 in stable condition.   Discharge Exam: Vitals:   06/07/16 0012 06/07/16 0632  BP: (!) 154/66 (!) 145/76  Pulse: 88 82  Resp: 20 17  Temp: 99.1 F (37.3 C) 99.6 F (37.6 C)  General: No acute distress, anxious to go home today. Cardiovascular: Regular rate and rhythm normal S1-S2 Respiratory: Normal respiratory effort, lungs are clear to auscultation bilaterally Abdomen: Soft nontender nondistended Extremities: No edema  Discharge Instructions   Discharge Instructions    Call  MD for:    Complete by:  As directed    Call MD for:  difficulty breathing, headache or visual disturbances    Complete by:  As directed    Call MD for:  extreme fatigue    Complete by:  As directed    Call MD for:  hives    Complete by:  As directed    Call MD for:  persistant dizziness or light-headedness    Complete by:  As directed    Call MD for:  persistant nausea and vomiting    Complete by:  As directed    Call MD for:  redness, tenderness, or signs of infection (pain, swelling, redness, odor or green/yellow discharge around incision site)    Complete by:  As directed    Call MD for:  severe uncontrolled pain    Complete by:  As directed    Call MD for:  temperature >100.4    Complete by:  As directed    Diet - low sodium heart healthy    Complete by:  As directed    Increase activity slowly    Complete by:  As directed      Current Discharge Medication List    CONTINUE these medications which have NOT CHANGED   Details  amLODipine (NORVASC) 5 MG tablet TAKE 1 TABLET ONCE DAILY. Qty: 30 tablet, Refills: 3    aspirin EC 325 MG EC tablet Take 1 tablet (325 mg total) by mouth daily. Qty: 30 tablet, Refills: 0    Biotin 10 MG TABS Take 10 mg by mouth daily.     Cholecalciferol (VITAMIN D) 2000 UNITS CAPS Take 2,000 Units by mouth daily.     Specialty Vitamins Products (MAGNESIUM, AMINO ACID CHELATE,) 133 MG tablet Take 1 tablet by mouth daily.    venlafaxine XR (EFFEXOR-XR) 75 MG 24 hr capsule TAKE 2 CAPSULES (150MG ) BY MOUTH DAILY. Qty: 60 capsule, Refills: 5    vitamin C (ASCORBIC ACID) 500 MG tablet Take 500 mg by mouth daily.    vitamin E 100 UNIT capsule Take 100 Units by mouth daily.      STOP taking these medications     amoxicillin (AMOXIL) 500 MG capsule        Allergies  Allergen Reactions  . Bactrim [Sulfamethoxazole-Trimethoprim] Nausea Only   Follow-up Information    Sara Post, MD Follow up in 1 week(s).   Specialty:  Family  Medicine Contact information: [place] [address] [phone]            The results of significant diagnostics from this hospitalization (including imaging, microbiology, ancillary and laboratory) are listed below for reference.    Significant Diagnostic Studies: Dg Chest 2 View  Result Date: 06/06/2016 CLINICAL DATA:  82 year old with acute onset of shortness of breath, dizziness and nausea. EXAM: CHEST  2 VIEW COMPARISON:  04/14/2014, 05/22/2012 and 01/26/2005. FINDINGS: AP semi-erect and lateral images were obtained. Suboptimal inspiration accounts for crowded bronchovascular markings, especially in the bases, and accentuates the cardiac silhouette. Taking this into account, cardiac silhouette upper normal in size for the AP technique. Thoracic aorta tortuous and atherosclerotic, unchanged. Hilar and mediastinal contours otherwise unremarkable. Prominent bronchovascular markings diffusely and mild central  peribronchial thickening, more so than on the prior examination. Lungs otherwise clear. No localized airspace consolidation. No pleural effusions. No pneumothorax. Normal pulmonary vascularity. Exaggeration of the usual thoracic kyphosis, osseous demineralization and degenerative changes throughout the thoracic spine. IMPRESSION: 1. Suboptimal inspiration. Mild changes of acute bronchitis and/or asthma without focal airspace pneumonia. 2. Thoracic aortic atherosclerosis. Electronically Signed   By: Evangeline Dakin M.D.   On: 06/06/2016 19:31    Microbiology: Recent Results (from the past 240 hour(s))  MRSA PCR Screening     Status: None   Collection Time: 06/07/16 12:36 AM  Result Value Ref Range Status   MRSA by PCR NEGATIVE NEGATIVE Final    Comment:        The GeneXpert MRSA Assay (FDA approved for NASAL specimens only), is one component of a comprehensive MRSA colonization surveillance program. It is not intended to diagnose MRSA infection nor to  guide or monitor treatment for MRSA infections.      Labs: Basic Metabolic Panel:  Recent Labs Lab 06/06/16 1912 06/07/16 0530  NA 138 139  K 3.8 3.3*  CL 105 107  CO2 27 26  GLUCOSE 116* 109*  BUN 17 10  CREATININE 0.77 0.67  CALCIUM 9.3 8.9   Liver Function Tests:  Recent Labs Lab 06/06/16 1912  AST 24  ALT 21  ALKPHOS 59  BILITOT 0.8  PROT 6.7  ALBUMIN 3.5   No results for input(s): LIPASE, AMYLASE in the last 168 hours. No results for input(s): AMMONIA in the last 168 hours. CBC:  Recent Labs Lab 06/06/16 1912 06/07/16 0530  WBC 5.5 4.3  NEUTROABS 4.6  --   HGB 12.7 12.5  HCT 38.9 38.3  MCV 90.5 90.1  PLT 104* 93*   Cardiac Enzymes: No results for input(s): CKTOTAL, CKMB, CKMBINDEX, TROPONINI in the last 168 hours. BNP: BNP (last 3 results) No results for input(s): BNP in the last 8760 hours.  ProBNP (last 3 results) No results for input(s): PROBNP in the last 8760 hours.  CBG: No results for input(s): GLUCAP in the last 168 hours.     Signed:  Kelvin Cellar MD.  [facility] 06/07/2016, 2:18 PM

## 2016-06-07 NOTE — Care Management Note (Signed)
Case Management Note  Patient Details  Name: Sara Logan MRN: [id number] Date of Birth: 1932-12-28  Subjective/Objective:                 [facility], [facility], dehydration, possible DC today.   Action/Plan:  Will return to [facility].   Expected Discharge Date:                  Expected Discharge Plan:   ([facility])  In-House Referral:  Clinical Social Work  Discharge planning Services  CM Consult  Post Acute Care Choice:    Choice offered to:     DME Arranged:    DME Agency:     HH Arranged:    [facility] Agency:     Status of Service:  In process, will continue to follow  If discussed at Long Length of Stay Meetings, dates discussed:    Additional Comments:  Carles Collet, RN 06/07/2016, 10:11 AM

## 2016-06-07 NOTE — Progress Notes (Signed)
NURSING PROGRESS NOTE  Sara Logan [id number] Admission Data: 06/07/2016 1:17 AM Attending Provider: Norval Morton, MD [id number] W, MD Code Status: FULL  Allergies:  Bactrim [sulfamethoxazole-trimethoprim] Past Medical History:   has a past medical history of Arthritis; Depression; Hyperlipidemia; Osteopenia; Stroke [facility]); Thrombocytopenia ([place]); Vitamin D deficiency; and Wrist fracture (2002). Past Surgical History:   has a past surgical history that includes Joint replacement (2002); Carpal tunnel release (2008); and Appendectomy [id number]). Social History:   reports that she has never smoked. She has never used smokeless tobacco. She reports that she does not drink alcohol or use drugs.  Sara Logan is a 82 y.o. female patient admitted from ED:   Last Documented Vital Signs: Blood pressure (!) 154/66, pulse 88, temperature 99.1 F (37.3 C), temperature source Oral, resp. rate 20, height 5\' 3"  (1.6 m), weight 88.7 kg (195 lb 9.6 oz), SpO2 94 %.  Cardiac Monitoring: Box # 18 in place. Cardiac monitor yields:normal sinus rhythm.  IV Fluids:  IV in place, occlusive dsg intact without redness, IV cath antecubital left, condition patent and no redness normal saline.   Skin: Intact and appropriate for ethnicity with the exception if upper lip redness due to new partial plate placement.  Patient orientated to room. Information packet given to patient. Admission inpatient armband information verified with patient to include name and date of birth and placed on patient arm. Side rails up x 2, fall assessment and education completed with patient. Patient able to verbalize understanding of risk associated with falls and verbalized understanding to call for assistance before getting out of bed. Call light within reach. Patient able to voice and demonstrate understanding of unit orientation instructions.    Will continue to evaluate and treat per MD orders.  Sara Hakim RN,  BSN

## 2016-06-07 NOTE — Progress Notes (Addendum)
Subjective:   Sara Logan is a 82 y.o. female who presents for Medicare Annual (Subsequent) preventive examination.  HRA assessment completed during this visit with Sara Logan  The Patient was informed that the wellness visit is to identify future health risk and educate and initiate measures that can reduce risk for increased disease through the lifespan.    NO ROS; Medicare Wellness Visit Describes health as good, fair or great? "has been better"  Had ER admit 09/27 (Wed) with overnight admit.  Hx:  States left eye ached at the beginning of the week; told the nurse at [facility] and BP was high / taking ASA; took tylenol and pain over left eye went away in 2 hours. BP resolved  Tuesday- she had dental work  WEd: had lunch and was leaving for volunteer work at hospice No issues until she got into the car   Felt nauseated; vomiting very small amount;  Walked from car to grass and fell; had no energy to pick herself up  Thought she may have been dehydrated in ER (K was low)  Had temp of 101;   Also noted reddish-bluish discoloration of upper lip as well as redness on right check; Upper lip appears slightly swollen; states it has been like this for 3 weeks;   Discussed apt today; but is going to the dentist at 2pm and will relay her issues and have someone look at her dental work, etc. will make apt with Dr. Elease Hashimoto next week. Dentist: Dr. Dub Amis; [phone]  Mini stroke in January of this year; left foot would not come off the floor but was transient symptom; BP was elevated at that time. EMS tx to hospital; had CAT scan; MRI; was thought she had mini stroke   Describes multiple stressors;   Mobility difficulty / no issues at present;  Knows her weight is a factor; can't seem to lose weight;   Lives at [facility] in [place] [facility] Husband passed away this past 08/31/23.   Psychosocial (hx of breast cancer)  Support ;lives in [facility] [place]  Son passed  away x 67 yo; ? Had heart issues;  They were from [place]  and moved here to [facility] D[facility] in [place]; with some health issues Youngest  Child in [place]; but not sure they will be there long;  Actually  loves [place] and very comfortable there Son is on D/a  Dtr moved to [place] with children after job issue here;  No one is here which is another adjustment  Living situation; in independent apt  Primary Prevention Tobacco never smoked  ETOH no  Diet "not good" eat deserts;  Breakfast; eats in her room; eggs; oatmeal;  Cheerios; yogurt;  Lunch: deli at [facility]; salads; healthy Dinner; making good choices  Eats fish  Would eat better if more selective   Exercise; opportunities to walk; lots to do but does not engage;  ? Depression and thought she may be;  Agreed to see Dr. Elease Hashimoto to fup on depression.    Dental; doctor has worked out [employer]; 2; last one was 47; did not trip but just fell; did not lose consciousness;   Given education on "Fall Prevention in the Home" for more safety tips the patient can apply as appropriate.   Given information on Community safety; driving safety, sun protection, firearm safety, smoke detectors as well as the "yellow dot" program for residents in [facility].   Screenings  for secondary risk Health Maintenance Due  Topic Date Due  . PNA vac Low Risk Adult (2 of 2 - PPSV23) 04/28/2015  . TETANUS/TDAP  09/11/2015  . INFLUENZA VACCINE  04/10/2016   2nd pneumonia shot;  Flu shot due Td due / can take at pharmacy if cheaper but let us know  Agrees to take her flu but request to wait a week until she sees Dr. Elease Hashimoto; Still very concerned over this most recent episode  Vision; goes frequently but doctor is following;  Next apt is in Oct   Mammogram 10/2014/ 10/2016  Dexa 09/2012 (-1.9) Does take calcium ( yogurt) and Vit d 2000 per day Colonoscopy 07/2007/ aged out EKG 05/2016  Stressors; car issues;  Family will help her  Cost her 100 to fix; and son bought her one; (mac)  Had to change meal plans; choices are not as good   Vaccination update: pending her feeling better; states she still does not feel good; BP was good today; temp 98.4   Medications reviewed for issues;   Feels Venlafaxine is not helping but will see Dr. Elease Hashimoto     Depression; anxiety or mood issues assessed  Do you have little interest or pleasure in doing things? Not engaging; but is eating; trying to volunteer.  Thinks she is; is withdrawing from some activities;  Have you been feeling down, depressed, hopeless? No "not at all"  PHQ9 waived or completed  She is seeing  Hospice counselor and grief counselor  Adjustments issues complicated by losses and financial constraints    Cognitive screen completed; MMSE documented or assessed for failures or issues with the AD8 screen below:   Ad8 score reviewed for issues;  Issues making decisions; no  Less interest in hobbies / activities" no  Repeats questions, stories; family complaining: NO  Trouble using ordinary gadgets; microwave; computer: no  Forgets the month or year: no  Mismanaging finances: no  Missing apt: no but does write them down  Daily problems with thinking of memory NO Ad8 score is 0  MMSE not appropriate unless AD8 score is > 2   Advanced Directive reviewed for completion or educated regarding [facility] [employer] form; the electing a health care agent and completing the Living Will.    Established and updated Risk reviewed and appropriate referral made or health recommendations as appropriate based on individual needs and choices;   Current Care Team reviewed and updated        Objective:     Vitals: BP 124/70   Pulse 74   Temp 98.4 F (36.9 C)   Ht 5\' 3"  (1.6 m)   Wt 195 lb (88.5 kg)   SpO2 96%   BMI 34.54 kg/m   Body mass index is 34.54 kg/m.   Tobacco History  Smoking Status  . Never Smoker  Smokeless Tobacco  .  Never Used     Counseling given: Yes   Past Medical History:  Diagnosis Date  . Arthritis   . Depression   . Hyperlipidemia   . Osteopenia   . Stroke ([place])   . Thrombocytopenia ([place])   . Vitamin D deficiency   . Wrist fracture 2002   left   Past Surgical History:  Procedure Laterality Date  . APPENDECTOMY  1967  . CARPAL TUNNEL RELEASE  2008   right  . JOINT REPLACEMENT  2002   right total    Family History  Problem Relation Age of Onset  . Breast cancer Mother   .  Breast cancer Sister    History  Sexual Activity  . Sexual activity: Not on file    Outpatient Encounter Prescriptions as of 06/08/2016  Medication Sig  . amLODipine (NORVASC) 5 MG tablet TAKE 1 TABLET ONCE DAILY. (Patient taking differently: TAKE 1 TABLET ONCE DAILY IN EVENING)  . aspirin EC 325 MG EC tablet Take 1 tablet (325 mg total) by mouth daily.  . Biotin 10 MG TABS Take 10 mg by mouth daily.   . Cholecalciferol (VITAMIN D) 2000 UNITS CAPS Take 2,000 Units by mouth daily.   Marland Kitchen Specialty Vitamins Products (MAGNESIUM, AMINO ACID CHELATE,) 133 MG tablet Take 1 tablet by mouth daily.  Marland Kitchen venlafaxine XR (EFFEXOR-XR) 75 MG 24 hr capsule TAKE 2 CAPSULES (150MG ) BY MOUTH DAILY.  . vitamin C (ASCORBIC ACID) 500 MG tablet Take 500 mg by mouth daily.  . vitamin E 100 UNIT capsule Take 100 Units by mouth daily.  . [DISCONTINUED] amoxicillin (AMOXIL) 500 MG capsule Take 2,000 mg by mouth once. FOR DENTAL  . [DISCONTINUED] 0.9 %  sodium chloride infusion   . [DISCONTINUED] acetaminophen (TYLENOL) tablet 650 mg   . [DISCONTINUED] albuterol (PROVENTIL) (2.5 MG/3ML) 0.083% nebulizer solution 2.5 mg   . [DISCONTINUED] amLODipine (NORVASC) tablet 5 mg   . [DISCONTINUED] aspirin EC tablet 325 mg   . [DISCONTINUED] cholecalciferol (VITAMIN D) tablet 2,000 Units   . [DISCONTINUED] enoxaparin (LOVENOX) injection 40 mg   . [DISCONTINUED] guaiFENesin (MUCINEX) 12 hr tablet 600 mg   . [DISCONTINUED] magnesium oxide  (MAG-OX) tablet 400 mg   . [DISCONTINUED] venlafaxine XR (EFFEXOR-XR) 24 hr capsule 75 mg   . [DISCONTINUED] vitamin C (ASCORBIC ACID) tablet 500 mg   . [DISCONTINUED] vitamin E capsule 100 Units    No facility-administered encounter medications on file as of 06/08/2016.     Activities of Daily Living In your present state of health, do you have any difficulty performing the following activities: 06/08/2016 06/07/2016  Hearing? N N  Vision? N N  Difficulty concentrating or making decisions? - N  Walking or climbing stairs? Y Y  Dressing or bathing? - Y  Doing errands, shopping? - N  Conservation officer, nature and eating ? N -  Using the Toilet? Y -  In the past six months, have you accidently leaked urine? N -  Do you have problems with loss of bowel control? N -  Managing your Medications? N -  Managing your Finances? N -  Housekeeping or managing your Housekeeping? N -  Some recent data might be hidden    Patient Care Team: Eulas Post, MD as PCP - General    Assessment:    Educated on stress reduction; prioritizing her issues;  Seeing Dr. Elease Hashimoto.  Exercise Activities and Dietary recommendations    Goals    . Exercise 150 minutes per week (moderate activity)          Exercise Will do more walking; can walk indoors or outdoors. Once a day; 30 minutes       Fall Risk Fall Risk  06/08/2016 12/08/2015 03/18/2015 08/07/2013  Falls in the past year? Yes No No No  Number falls in past yr: 2 or more - - -  Risk for fall due to : (No Data) - - -  Risk for fall due to (comments): feels she needs to take care of herself  - - -  Follow up Education provided - - -   Depression Screen PHQ 2/9 Scores 06/08/2016 03/18/2015 08/07/2013  PHQ -  2 Score 2 0 0  PHQ- 9 Score 4 - -     Cognitive Testing MMSE - Mini Mental State Exam 06/08/2016  Not completed: (No Data)   Ad8 score is 0   Immunization History  Administered Date(s) Administered  . Influenza Split 06/18/2012  .  Influenza Whole 07/11/2010  . Influenza, High Dose Seasonal PF 07/07/2013, 07/14/2014, 06/24/2015  . Pneumococcal Conjugate-13 04/27/2014  . Td 09/10/2005  . Zoster 11/29/2006   Screening Tests Health Maintenance  Topic Date Due  . PNA vac Low Risk Adult (2 of 2 - PPSV23) 04/28/2015  . TETANUS/TDAP  09/11/2015  . INFLUENZA VACCINE  04/10/2016  . DEXA SCAN  Completed  . ZOSTAVAX  Completed      Plan:   Get some rest;   Make an apt with Dr. Elease Hashimoto (review for depression and fup for ER / overnight stay at hospital )   If temp spikes, go to ER; and any unusual symptoms; go to er; reviewed stroke s/s; nurses are available at [facility] .   During the course of the visit the patient was educated and counseled about the following appropriate screening and preventive services:   Vaccines to include Pneumoccal, Influenza, Hepatitis B, Td, Zostavax, HCV; declined flu and PSV23 but will take   Electrocardiogram  Cardiovascular Disease  Colorectal cancer screening  Bone density screening on calcium and Vit d;   Not exercising at present   Diabetes screening n/a  Glaucoma screening/ regular eye checks;   Mammography/waive  Nutrition counseling   Patient Instructions (the written plan) was given to the patient.   [id number], RN  06/08/2016   Agree with assessment and plan as above per Wynetta Fines.  Sara Logan recent urine cx from hospital negative.  Will assess her depression in more detail at follow up.  Eulas Post MD [facility]

## 2016-06-08 ENCOUNTER — Ambulatory Visit: Payer: Medicare Other

## 2016-06-08 VITALS — BP 124/70 | HR 74 | Temp 98.4°F | Ht 63.0 in | Wt 195.0 lb

## 2016-06-08 DIAGNOSIS — Z0000 Encounter for general adult medical examination without abnormal findings: Secondary | ICD-10-CM

## 2016-06-08 LAB — URINE CULTURE

## 2016-06-08 NOTE — Patient Instructions (Addendum)
Sara Logan , Thank you for taking time to come for your Medicare Wellness Visit. I appreciate your ongoing commitment to your health goals. Please review the following plan we discussed and let me know if I can assist you in the future.   Get some rest;   Make an apt with Dr. Elease Hashimoto  If temp spikes, go to ER; and any unusual symptoms; go to er   Due to osteopenia; re start your exercise with machines; as tolerated  Calcium 1252m with Vit D 800u per day; more as directed by physician Strength building exercises discussed; can include walking; housework; small weights or stretch bands; silver sneakers [facility] to the [facility]  2nd pneumonia; PSV 23 Flu shot due when feeling better TD (take the tetanus with pertussis or tdap)     These are the goals we discussed: Goals    . Exercise 150 minutes per week (moderate activity)          Exercise Will do more walking; can walk indoors or outdoors. Once a day; 30 minutes        This is a list of the screening recommended for you and due dates:  Health Maintenance  Topic Date Due  . Pneumonia vaccines (2 of 2 - PPSV23) 04/28/2015  . Tetanus Vaccine  09/11/2015  . Flu Shot  04/10/2016  . DEXA scan (bone density measurement)  Completed  . Shingles Vaccine  Completed       Fall Prevention in the Home  Falls can cause injuries. They can happen to people of all ages. There are many things you can do to make your home safe and to help prevent falls.  WHAT CAN I DO ON THE OUTSIDE OF MY HOME?  Regularly fix the edges of walkways and driveways and fix any cracks.  Remove anything that might make you trip as you walk through a door, such as a raised step or threshold.  Trim any bushes or trees on the path to your home.  Use bright outdoor lighting.  Clear any walking paths of anything that might make someone trip, such as rocks or tools.  Regularly check to see if handrails are loose or broken. Make sure that both sides of any  steps have handrails.  Any raised decks and porches should have guardrails on the edges.  Have any leaves, snow, or ice cleared regularly.  Use sand or salt on walking paths during winter.  Clean up any spills in your garage right away. This includes oil or grease spills. WHAT CAN I DO IN THE BATHROOM?   Use night lights.  Install grab bars by the toilet and in the tub and shower. Do not use towel bars as grab bars.  Use non-skid mats or decals in the tub or shower.  If you need to sit down in the shower, use a plastic, non-slip stool.  Keep the floor dry. Clean up any water that spills on the floor as soon as it happens.  Remove soap buildup in the tub or shower regularly.  Attach bath mats securely with double-sided non-slip rug tape.  Do not have throw rugs and other things on the floor that can make you trip. WHAT CAN I DO IN THE BEDROOM?  Use night lights.  Make sure that you have a light by your bed that is easy to reach.  Do not use any sheets or blankets that are too big for your bed. They should not hang down onto the floor.  Have a firm chair that has side arms. You can use this for support while you get dressed.  Do not have throw rugs and other things on the floor that can make you trip. WHAT CAN I DO IN THE KITCHEN?  Clean up any spills right away.  Avoid walking on wet floors.  Keep items that you use a lot in easy-to-reach places.  If you need to reach something above you, use a strong step stool that has a grab bar.  Keep electrical cords out of the way.  Do not use floor polish or wax that makes floors slippery. If you must use wax, use non-skid floor wax.  Do not have throw rugs and other things on the floor that can make you trip. WHAT CAN I DO WITH MY STAIRS?  Do not leave any items on the stairs.  Make sure that there are handrails on both sides of the stairs and use them. Fix handrails that are broken or loose. Make sure that handrails are  as long as the stairways.  Check any carpeting to make sure that it is firmly attached to the stairs. Fix any carpet that is loose or worn.  Avoid having throw rugs at the top or bottom of the stairs. If you do have throw rugs, attach them to the floor with carpet tape.  Make sure that you have a light switch at the top of the stairs and the bottom of the stairs. If you do not have them, ask someone to add them for you. WHAT ELSE CAN I DO TO HELP PREVENT FALLS?  Wear shoes that:  Do not have high heels.  Have rubber bottoms.  Are comfortable and fit you well.  Are closed at the toe. Do not wear sandals.  If you use a stepladder:  Make sure that it is fully opened. Do not climb a closed stepladder.  Make sure that both sides of the stepladder are locked into place.  Ask someone to hold it for you, if possible.  Clearly mark and make sure that you can see:  Any grab bars or handrails.  First and last steps.  Where the edge of each step is.  Use tools that help you move around (mobility aids) if they are needed. These include:  Canes.  Walkers.  Scooters.  Crutches.  Turn on the lights when you go into a dark area. Replace any light bulbs as soon as they burn out.  Set up your furniture so you have a clear path. Avoid moving your furniture around.  If any of your floors are uneven, fix them.  If there are any pets around you, be aware of where they are.  Review your medicines with your doctor. Some medicines can make you feel dizzy. This can increase your chance of falling. Ask your doctor what other things that you can do to help prevent falls.   This information is not intended to replace advice given to you by your health care provider. Make sure you discuss any questions you have with your health care provider.   Document Released: 06/23/2009 Document Revised: 01/11/2015 Document Reviewed: 10/01/2014 Elsevier Interactive Patient Education 2016 [place] Maintenance, Female Adopting a healthy lifestyle and getting preventive care can go a long way to promote health and wellness. Talk with your health care provider about what schedule of regular examinations is right for you. This is a good chance for you to check in with your provider about  disease prevention and staying healthy. In between checkups, there are plenty of things you can do on your own. Experts have done a lot of research about which lifestyle changes and preventive measures are most likely to keep you healthy. Ask your health care provider for more information. WEIGHT AND DIET  Eat a healthy diet  Be sure to include plenty of vegetables, fruits, low-fat dairy products, and lean protein.  Do not eat a lot of foods high in solid fats, added sugars, or salt.  Get regular exercise. This is one of the most important things you can do for your health.  Most adults should exercise for at least 150 minutes each week. The exercise should increase your heart rate and make you sweat (moderate-intensity exercise).  Most adults should also do strengthening exercises at least twice a week. This is in addition to the moderate-intensity exercise.  Maintain a healthy weight  Body mass index (BMI) is a measurement that can be used to identify possible weight problems. It estimates body fat based on height and weight. Your health care provider can help determine your BMI and help you achieve or maintain a healthy weight.  For females 31 years of age and older:   A BMI below 18.5 is considered underweight.  A BMI of 18.5 to 24.9 is normal.  A BMI of 25 to 29.9 is considered overweight.  A BMI of 30 and above is considered obese.  Watch levels of cholesterol and blood lipids  You should start having your blood tested for lipids and cholesterol at 80 years of age, then have this test every 5 years.  You may need to have your cholesterol levels checked more often if:  Your  lipid or cholesterol levels are high.  You are older than 80 years of age.  You are at high risk for heart disease.  CANCER SCREENING   Lung Cancer  Lung cancer screening is recommended for adults 25-62 years old who are at high risk for lung cancer because of a history of smoking.  A yearly low-dose CT scan of the lungs is recommended for people who:  Currently smoke.  Have quit within the past 15 years.  Have at least a 30-pack-year history of smoking. A pack year is smoking an average of one pack of cigarettes a day for 1 year.  Yearly screening should continue until it has been 15 years since you quit.  Yearly screening should stop if you develop a health problem that would prevent you from having lung cancer treatment.  Breast Cancer  Practice breast self-awareness. This means understanding how your breasts normally appear and feel.  It also means doing regular breast self-exams. Let your health care provider know about any changes, no matter how small.  If you are in your 20s or 30s, you should have a clinical breast exam (CBE) by a health care provider every 1-3 years as part of a regular health exam.  If you are 85 or older, have a CBE every year. Also consider having a breast X-ray (mammogram) every year.  If you have a family history of breast cancer, talk to your health care provider about genetic screening.  If you are at high risk for breast cancer, talk to your health care provider about having an MRI and a mammogram every year.  Breast cancer gene (BRCA) assessment is recommended for women who have family members with BRCA-related cancers. BRCA-related cancers include:  Breast.  Ovarian.  Tubal.  Peritoneal cancers.  Results of the assessment will determine the need for genetic counseling and BRCA1 and BRCA2 testing. Cervical Cancer Your health care provider may recommend that you be screened regularly for cancer of the pelvic organs (ovaries, uterus,  and vagina). This screening involves a pelvic examination, including checking for microscopic changes to the surface of your cervix (Pap test). You may be encouraged to have this screening done every 3 years, beginning at age 1.  For women ages 26-65, health care providers may recommend pelvic exams and Pap testing every 3 years, or they may recommend the Pap and pelvic exam, combined with testing for human papilloma virus (HPV), every 5 years. Some types of HPV increase your risk of cervical cancer. Testing for HPV may also be done on women of any age with unclear Pap test results.  Other health care providers may not recommend any screening for nonpregnant women who are considered low risk for pelvic cancer and who do not have symptoms. Ask your health care provider if a screening pelvic exam is right for you.  If you have had past treatment for cervical cancer or a condition that could lead to cancer, you need Pap tests and screening for cancer for at least 20 years after your treatment. If Pap tests have been discontinued, your risk factors (such as having a new sexual partner) need to be reassessed to determine if screening should resume. Some women have medical problems that increase the chance of getting cervical cancer. In these cases, your health care provider may recommend more frequent screening and Pap tests. Colorectal Cancer  This type of cancer can be detected and often prevented.  Routine colorectal cancer screening usually begins at 80 years of age and continues through 80 years of age.  Your health care provider may recommend screening at an earlier age if you have risk factors for colon cancer.  Your health care provider may also recommend using home test kits to check for hidden blood in the stool.  A small camera at the end of a tube can be used to examine your colon directly (sigmoidoscopy or colonoscopy). This is done to check for the earliest forms of colorectal  cancer.  Routine screening usually begins at age 74.  Direct examination of the colon should be repeated every 5-10 years through 80 years of age. However, you may need to be screened more often if early forms of precancerous polyps or small growths are found. Skin Cancer  Check your skin from head to toe regularly.  Tell your health care provider about any new moles or changes in moles, especially if there is a change in a mole's shape or color.  Also tell your health care provider if you have a mole that is larger than the size of a pencil eraser.  Always use sunscreen. Apply sunscreen liberally and repeatedly throughout the day.  Protect yourself by wearing long sleeves, pants, a wide-brimmed hat, and sunglasses whenever you are outside. HEART DISEASE, DIABETES, AND HIGH BLOOD PRESSURE   High blood pressure causes heart disease and increases the risk of stroke. High blood pressure is more likely to develop in:  People who have blood pressure in the high end of the normal range (130-139/85-89 mm Hg).  People who are overweight or obese.  People who are African American.  If you are 31-52 years of age, have your blood pressure checked every 3-5 years. If you are 18 years of age or older, have your blood pressure checked every year.  You should have your blood pressure measured twice--once when you are at a hospital or clinic, and once when you are not at a hospital or clinic. Record the average of the two measurements. To check your blood pressure when you are not at a hospital or clinic, you can use:  An automated blood pressure machine at a pharmacy.  A home blood pressure monitor.  If you are between 31 years and 34 years old, ask your health care provider if you should take aspirin to prevent strokes.  Have regular diabetes screenings. This involves taking a blood sample to check your fasting blood sugar level.  If you are at a normal weight and have a low risk for diabetes,  have this test once every three years after 80 years of age.  If you are overweight and have a high risk for diabetes, consider being tested at a younger age or more often. PREVENTING INFECTION  Hepatitis B  If you have a higher risk for hepatitis B, you should be screened for this virus. You are considered at high risk for hepatitis B if:  You were born in a country where hepatitis B is common. Ask your health care provider which countries are considered high risk.  Your parents were born in a high-risk country, and you have not been immunized against hepatitis B (hepatitis B vaccine).  You have HIV or AIDS.  You use needles to inject street drugs.  You live with someone who has hepatitis B.  You have had sex with someone who has hepatitis B.  You get hemodialysis treatment.  You take certain medicines for conditions, including cancer, organ transplantation, and autoimmune conditions. Hepatitis C  Blood testing is recommended for:  Everyone born from [birth year] through 1963.  Anyone with known risk factors for hepatitis C. Sexually transmitted infections (STIs)  You should be screened for sexually transmitted infections (STIs) including gonorrhea and chlamydia if:  You are sexually active and are younger than 80 years of age.  You are older than 80 years of age and your health care provider tells you that you are at risk for this type of infection.  Your sexual activity has changed since you were last screened and you are at an increased risk for chlamydia or gonorrhea. Ask your health care provider if you are at risk.  If you do not have HIV, but are at risk, it may be recommended that you take a prescription medicine daily to prevent HIV infection. This is called pre-exposure prophylaxis (PrEP). You are considered at risk if:  You are sexually active and do not regularly use condoms or know the HIV status of your partner(s).  You take drugs by injection.  You are sexually  active with a partner who has HIV. Talk with your health care provider about whether you are at high risk of being infected with HIV. If you choose to begin PrEP, you should first be tested for HIV. You should then be tested every 3 months for as long as you are taking PrEP.  PREGNANCY   If you are premenopausal and you may become pregnant, ask your health care provider about preconception counseling.  If you may become pregnant, take 400 to 800 micrograms (mcg) of folic acid every day.  If you want to prevent pregnancy, talk to your health care provider about birth control (contraception). OSTEOPOROSIS AND MENOPAUSE   Osteoporosis is a disease in which the bones lose minerals and strength with aging. This can result  in serious bone fractures. Your risk for osteoporosis can be identified using a bone density scan.  If you are 84 years of age or older, or if you are at risk for osteoporosis and fractures, ask your health care provider if you should be screened.  Ask your health care provider whether you should take a calcium or vitamin D supplement to lower your risk for osteoporosis.  Menopause may have certain physical symptoms and risks.  Hormone replacement therapy may reduce some of these symptoms and risks. Talk to your health care provider about whether hormone replacement therapy is right for you.  HOME CARE INSTRUCTIONS   Schedule regular health, dental, and eye exams.  Stay current with your immunizations.   Do not use any tobacco products including cigarettes, chewing tobacco, or electronic cigarettes.  If you are pregnant, do not drink alcohol.  If you are breastfeeding, limit how much and how often you drink alcohol.  Limit alcohol intake to no more than 1 drink per day for nonpregnant women. One drink equals 12 ounces of beer, 5 ounces of wine, or 1 ounces of hard liquor.  Do not use street drugs.  Do not share needles.  Ask your health care provider for help if  you need support or information about quitting drugs.  Tell your health care provider if you often feel depressed.  Tell your health care provider if you have ever been abused or do not feel safe at home.   This information is not intended to replace advice given to you by your health care provider. Make sure you discuss any questions you have with your health care provider.   Document Released: 03/12/2011 Document Revised: 09/17/2014 Document Reviewed: 07/29/2013 Elsevier Interactive Patient Education [employer].

## 2016-06-13 ENCOUNTER — Ambulatory Visit (INDEPENDENT_AMBULATORY_CARE_PROVIDER_SITE_OTHER): Payer: Medicare Other | Admitting: Family Medicine

## 2016-06-13 VITALS — BP 130/78 | HR 102 | Temp 98.0°F | Ht 63.0 in | Wt 192.0 lb

## 2016-06-13 DIAGNOSIS — R55 Syncope and collapse: Secondary | ICD-10-CM | POA: Diagnosis not present

## 2016-06-13 DIAGNOSIS — F341 Dysthymic disorder: Secondary | ICD-10-CM | POA: Diagnosis not present

## 2016-06-13 DIAGNOSIS — Z23 Encounter for immunization: Secondary | ICD-10-CM | POA: Diagnosis not present

## 2016-06-13 DIAGNOSIS — I639 Cerebral infarction, unspecified: Secondary | ICD-10-CM

## 2016-06-13 DIAGNOSIS — I10 Essential (primary) hypertension: Secondary | ICD-10-CM | POA: Diagnosis not present

## 2016-06-13 NOTE — Progress Notes (Signed)
Pre visit review using our clinic review tool, if applicable. No additional management support is needed unless otherwise documented below in the visit note. 

## 2016-06-13 NOTE — Progress Notes (Signed)
Subjective:     Patient ID: Sara Logan, female   DOB: 1933/07/07, 82 y.o.   MRN: [id number]  HPI Patient seen for hospital follow-up. She was admitted on 9/27 with near syncopal episode. This occurred after she had been grocery shopping and had gotten into her hot car and felt somewhat overheated and felt slightly dizzy and slightly nausea while driving. After she got home she had some dry heaves after getting out of the car was lightheaded and apparently walked a short distance and began vomiting. She does not think she actually had full syncope. EMS was called and she was given Zofran. Her symptoms improved after IV fluids. Lab work was unremarkable. She's not had any recurrent symptoms since discharge. She does have occasional lightheadedness with standing. She takes amlodipine 5 mg daily for hypertension. She's not had any focal weakness. No confusion. No headaches. No chest pains. Recent troponins negative.  Still needs flu vaccine.  Past Medical History:  Diagnosis Date  . Arthritis   . Depression   . Hyperlipidemia   . Osteopenia   . Stroke ([place])   . Thrombocytopenia ([place])   . Vitamin D deficiency   . Wrist fracture 2002   left   Past Surgical History:  Procedure Laterality Date  . APPENDECTOMY  1967  . CARPAL TUNNEL RELEASE  2008   right  . JOINT REPLACEMENT  2002   right total     reports that she has never smoked. She has never used smokeless tobacco. She reports that she does not drink alcohol or use drugs. family history includes Breast cancer in her mother and sister. Allergies  Allergen Reactions  . Bactrim [Sulfamethoxazole-Trimethoprim] Nausea Only     Review of Systems  Constitutional: Negative for fatigue.  Eyes: Negative for visual disturbance.  Respiratory: Negative for cough, chest tightness, shortness of breath and wheezing.   Cardiovascular: Negative for chest pain, palpitations and leg swelling.  Gastrointestinal: Negative for abdominal pain.   Genitourinary: Negative for dysuria.  Neurological: Positive for dizziness. Negative for seizures, syncope, weakness, light-headedness and headaches.  Psychiatric/Behavioral: Negative for confusion.       Objective:   Physical Exam  Constitutional: She is oriented to person, place, and time. She appears well-developed and well-nourished.  HENT:  Mouth/Throat: Oropharynx is clear and moist.  Cardiovascular: Normal rate and regular rhythm.   Pulmonary/Chest: Effort normal and breath sounds normal. No respiratory distress. She has no wheezes. She has no rales.  Musculoskeletal: She exhibits no edema.  Neurological: She is alert and oriented to person, place, and time. No cranial nerve deficit. Coordination normal.       Assessment:     #1 recent near syncopal episode probably related to dehydration and probable vasovagal related drop in blood pressure.  No definite syncope  #2 history of hypertension. Orthostatic hypotension today with blood pressure today seated left arm 122/64 and standing 98/60    Plan:     -Stay well-hydrated -She is encouraged to stand for a few seconds when changing position before ambulating -Reduce amlodipine to 2.5 mgs once daily -Office follow-up in 4 weeks to reassess blood pressure -flu vaccine given.  Eulas Post MD [facility]

## 2016-06-13 NOTE — Patient Instructions (Signed)
Go ahead and decrease the Amlodipine to one half tablet daily Stay well hydrated.

## 2016-06-18 ENCOUNTER — Encounter: Payer: Self-pay | Admitting: Neurology

## 2016-06-18 ENCOUNTER — Other Ambulatory Visit: Payer: Self-pay | Admitting: Family Medicine

## 2016-06-18 ENCOUNTER — Ambulatory Visit (INDEPENDENT_AMBULATORY_CARE_PROVIDER_SITE_OTHER): Payer: Medicare Other | Admitting: Neurology

## 2016-06-18 VITALS — BP 119/80 | HR 82 | Ht 63.0 in | Wt 196.0 lb

## 2016-06-18 DIAGNOSIS — I639 Cerebral infarction, unspecified: Secondary | ICD-10-CM | POA: Diagnosis not present

## 2016-06-18 DIAGNOSIS — I6381 Other cerebral infarction due to occlusion or stenosis of small artery: Secondary | ICD-10-CM

## 2016-06-18 NOTE — Patient Instructions (Signed)
I had a long d/w patient about her remote lacunar stroke,recent hospitalization for dehydration, risk for recurrent stroke/TIAs, personally independently reviewed imaging studies and stroke evaluation results and answered questions.Continue aspirin 325 mg daily  for secondary stroke prevention and maintain strict control of hypertension with blood pressure goal below 130/90, diabetes with hemoglobin A1c goal below 6.5% and lipids with LDL cholesterol goal below 70 mg/dL. I also advised the patient to eat a healthy diet with plenty of whole grains, cereals, fruits and vegetables, exercise regularly and maintain ideal body weight. I encouraged her to maintain adequate hydration and drink at least 6-8 glasses of fluids/liquids today. No routine scheduled follow-up appointment with me is necessary but she may return as needed

## 2016-06-18 NOTE — Progress Notes (Signed)
[facility] [address] [place]. [place] [address] [phone]       OFFICE FOLLOW-UP NOTE  [name] Date of Birth:  1933-05-27 Medical Record Number:  [id number]   HPI: 8 year lady seen today for first office follow-up visit following hospital admission for stroke in January 2017. Sara Logan is a 82 y.o. female with a history of hyperlipidemia who presented with sudden onset right-sided weakness that started not eating lunch. She sat down around 12:30 PM, on 09/25/2015 and got up at 1:30 PM and noticed that her right side was dragging. EMS was called and she was transported as a code stroke. After arrival here, she was found to have significant right-sided weakness which was felt to merit TPA and therefore risks and benefits were discussed and she agreed to proceed with TPA. Her last known well was listed as 1:30 PM on 09/25/2015. She was admitted to the neuro ICU for further evaluation and treatment. Her blood pressure was tightly controlled. She made full neurologic recovery and remained stable. MRI scan of the brain revealed no acute infarct but showed remote age left thalamic infarct and changes of small vessel disease and mild generalized atrophy. MRA of the brain showed no large vessel intracranial stenosis. MRA of the neck also showed no significant extracranial stenosis. Transthoracic echo showed normal ejection fraction without cardiac source of embolism. Hemoglobin A1c was 5.9 in August 2016. LDL cholesterol was 1-2 mg percent. Patient didn't well and was discharged home. She is currently living at home. She says she is back to her baseline and has no deficits. She states her blood pressure is well controlled today it is 134/76. She is tolerating her Lipitor well without myalgias or arthralgias or other side effects. She is currently living at friend's home [place]. She's had some recent symptoms of irritable bowel and plans to see her primary felt care  physician for the same. She has not had any follow-up lipid profile checked after starting Lipitor. She is tolerating aspirin well without any side effects. Update 06/18/2016 : She returns for follow-up after last visit 6 months ago. He states she was briefly hospitalized last week for symptoms of near syncope which have felt to be related to dehydration. She was found to be orthostatic. She was hydrated and did well. She states she had had some dental work done a few days prior and son sure whether this was related. She remains on aspirin for stroke prevention which is tolerating well without bleeding or bruising. She has had no recurrent stroke or TIA symptoms. She states her blood pressure is well controlled and today it is 119/80. She has no new complaints today. ROS:   14 system review of systems is positive for  eye discharge, shortness of breath, frequency of urination, dizziness and all other systems negative  PMH:  Past Medical History:  Diagnosis Date  . Arthritis   . Depression   . Hyperlipidemia   . Osteopenia   . Stroke ([place])   . Thrombocytopenia ([place])   . Vitamin D deficiency   . Wrist fracture 2002   left    Social History:  Social History   Social History  . Marital status: Married    Spouse name: N/A  . Number of children: N/A  . Years of education: N/A   Occupational History  . Not on file.   Social History Main Topics  . Smoking status: Never Smoker  . Smokeless tobacco: Never Used  .  Alcohol use No  . Drug use: No  . Sexual activity: Not on file   Other Topics Concern  . Not on file   Social History Narrative  . No narrative on file    Medications:   Current Outpatient Prescriptions on File Prior to Visit  Medication Sig Dispense Refill  . amLODipine (NORVASC) 5 MG tablet Take 2.5 mg by mouth daily. Take one half tablet once daily    . aspirin EC 325 MG EC tablet Take 1 tablet (325 mg total) by mouth daily. 30 tablet 0  . Biotin 10 MG TABS Take 10  mg by mouth daily.     . Cholecalciferol (VITAMIN D) 2000 UNITS CAPS Take 2,000 Units by mouth daily.     Marland Kitchen Specialty Vitamins Products (MAGNESIUM, AMINO ACID CHELATE,) 133 MG tablet Take 1 tablet by mouth daily.    Marland Kitchen venlafaxine XR (EFFEXOR-XR) 75 MG 24 hr capsule TAKE 2 CAPSULES (150MG ) BY MOUTH DAILY. 60 capsule 5  . vitamin C (ASCORBIC ACID) 500 MG tablet Take 500 mg by mouth daily.    . vitamin E 100 UNIT capsule Take 100 Units by mouth daily.     No current facility-administered medications on file prior to visit.     Allergies:   Allergies  Allergen Reactions  . Bactrim [Sulfamethoxazole-Trimethoprim] Nausea Only    Physical Exam General: well developed, well nourished elderly lady, seated, in no evident distress Head: head normocephalic and atraumatic.  Neck: supple with no carotid or supraclavicular bruits Cardiovascular: regular rate and rhythm, no murmurs Musculoskeletal: no deformity Skin:  no rash/petichiae Vascular:  Normal pulses all extremities Vitals:   06/18/16 1132  BP: 119/80  Pulse: 82   Neurologic Exam Mental Status: Awake and fully alert. Oriented to place and time. Recent and remote memory intact. Attention span, concentration and fund of knowledge appropriate. Mood and affect appropriate.  Cranial Nerves: Fundoscopic exam Not done. Pupils equal, briskly reactive to light. Extraocular movements full without nystagmus. Visual fields full to confrontation. Hearing intact. Facial sensation intact. Face, tongue, palate moves normally and symmetrically.  Motor: Normal bulk and tone. Normal strength in all tested extremity muscles.Diminished fine finger movements on the right. Orbits left over right upper extremity. Sensory.: intact to touch ,pinprick .position and vibratory sensation.  Coordination: Rapid alternating movements normal in all extremities. Finger-to-nose and heel-to-shin performed accurately bilaterally. Gait and Station: Arises from chair without  difficulty. Stance is normal. Gait demonstrates normal stride length and balance . Able to heel, toe and tandem walk without difficulty.  Reflexes: 1+ and symmetric. Toes downgoing.   NIHSS 0  Modified Rankin  1  ASSESSMENT: 10 year patient with small left brain stroke due to small vessel disease not visualized on MRI treated with IV tPA in January 2017 with full recovery. Vascular risk factors of hypertension and hyperlipidemia and age    PLAN: I had a long d/w patient about her remote lacunar stroke,recent hospitalization for dehydration, risk for recurrent stroke/TIAs, personally independently reviewed imaging studies and stroke evaluation results and answered questions.Continue aspirin 325 mg daily  for secondary stroke prevention and maintain strict control of hypertension with blood pressure goal below 130/90, diabetes with hemoglobin A1c goal below 6.5% and lipids with LDL cholesterol goal below 70 mg/dL. I also advised the patient to eat a healthy diet with plenty of whole grains, cereals, fruits and vegetables, exercise regularly and maintain ideal body weight. I encouraged her to maintain adequate hydration and drink at least 6-8 glasses  of fluids/liquids today. Greater than 50% time during this 25 minute visit was spent on counseling and coordination of care about stroke risk prevention and treatment .No routine scheduled follow-up appointment with me is necessary but she may return as needed Antony Contras, MD   Note: This document was prepared with digital dictation and possible smart phrase technology. Any transcriptional errors that result from this process are unintentional

## 2016-07-04 DIAGNOSIS — F341 Dysthymic disorder: Secondary | ICD-10-CM | POA: Diagnosis not present

## 2016-07-17 ENCOUNTER — Ambulatory Visit (INDEPENDENT_AMBULATORY_CARE_PROVIDER_SITE_OTHER): Payer: Medicare Other | Admitting: Family Medicine

## 2016-07-17 VITALS — BP 142/80 | HR 97 | Temp 98.2°F | Ht 63.0 in | Wt 196.1 lb

## 2016-07-17 DIAGNOSIS — R300 Dysuria: Secondary | ICD-10-CM | POA: Diagnosis not present

## 2016-07-17 DIAGNOSIS — R3915 Urgency of urination: Secondary | ICD-10-CM

## 2016-07-17 DIAGNOSIS — I639 Cerebral infarction, unspecified: Secondary | ICD-10-CM

## 2016-07-17 LAB — POCT URINALYSIS DIPSTICK
Bilirubin, UA: NEGATIVE
Glucose, UA: NEGATIVE
Ketones, UA: NEGATIVE
Nitrite, UA: NEGATIVE
Spec Grav, UA: 1.02
Urobilinogen, UA: 0.2
pH, UA: 5.5

## 2016-07-17 MED ORDER — CIPROFLOXACIN HCL 500 MG PO TABS: 500.0000 mg | ORAL_TABLET | Freq: Two times a day (BID) | ORAL | 0 refills | Status: DC

## 2016-07-17 NOTE — Progress Notes (Signed)
Pre visit review using our clinic review tool, if applicable. No additional management support is needed unless otherwise documented below in the visit note. 

## 2016-07-17 NOTE — Progress Notes (Signed)
Subjective:     Patient ID: Sara Logan, female   DOB: June 02, 1933, 82 y.o.   MRN: [id number]  HPI  Acute visit for dysuria. She's had a few days now of urine frequency and burning with urination. History of frequent UTIs in the past. Recent UTI treated with Bactrim and developed nausea with that medication. She has tolerated Cipro in the past. No fevers or chills. No nausea or vomiting. Increased malaise.  Past Medical History:  Diagnosis Date  . Arthritis   . Depression   . Hyperlipidemia   . Osteopenia   . Stroke ([place])   . Thrombocytopenia ([place])   . Vitamin D deficiency   . Wrist fracture 2002   left   Past Surgical History:  Procedure Laterality Date  . APPENDECTOMY  1967  . CARPAL TUNNEL RELEASE  2008   right  . JOINT REPLACEMENT  2002   right total     reports that she has never smoked. She has never used smokeless tobacco. She reports that she does not drink alcohol or use drugs. family history includes Breast cancer in her mother and sister. Allergies  Allergen Reactions  . Bactrim [Sulfamethoxazole-Trimethoprim] Nausea Only    Review of Systems  Constitutional: Negative for chills and fever.  Gastrointestinal: Negative for nausea.  Genitourinary: Positive for dysuria, frequency and urgency. Negative for hematuria.       Objective:   Physical Exam  Constitutional: She appears well-developed and well-nourished.  Cardiovascular: Normal rate and regular rhythm.   Pulmonary/Chest: Effort normal and breath sounds normal. No respiratory distress. She has no wheezes. She has no rales.  Neurological: She is alert.       Assessment:     Dysuria. Rule out UTI. Urine dipstick suggest likely UTI with leukocytes and blood    Plan:     -Obtain urine culture -Start Cipro 500 mgs twice a day for 7 days -Drink plenty of fluids -Follow-up immediately for any fever or worsening symptoms  Eulas Post MD [facility] at [facility]

## 2016-07-17 NOTE — Patient Instructions (Signed)

## 2016-07-18 DIAGNOSIS — L821 Other seborrheic keratosis: Secondary | ICD-10-CM | POA: Diagnosis not present

## 2016-07-18 DIAGNOSIS — D1801 Hemangioma of skin and subcutaneous tissue: Secondary | ICD-10-CM | POA: Diagnosis not present

## 2016-07-18 DIAGNOSIS — L853 Xerosis cutis: Secondary | ICD-10-CM | POA: Diagnosis not present

## 2016-07-20 LAB — URINE CULTURE

## 2016-07-26 ENCOUNTER — Other Ambulatory Visit: Payer: Self-pay

## 2016-07-26 MED ORDER — CEPHALEXIN 500 MG PO CAPS: 500.0000 mg | ORAL_CAPSULE | Freq: Three times a day (TID) | ORAL | 0 refills | Status: DC

## 2016-08-20 DIAGNOSIS — H268 Other specified cataract: Secondary | ICD-10-CM | POA: Diagnosis not present

## 2016-08-20 DIAGNOSIS — H524 Presbyopia: Secondary | ICD-10-CM | POA: Diagnosis not present

## 2016-08-20 DIAGNOSIS — H52201 Unspecified astigmatism, right eye: Secondary | ICD-10-CM | POA: Diagnosis not present

## 2016-08-20 DIAGNOSIS — H5211 Myopia, right eye: Secondary | ICD-10-CM | POA: Diagnosis not present

## 2016-08-20 DIAGNOSIS — Z961 Presence of intraocular lens: Secondary | ICD-10-CM | POA: Diagnosis not present

## 2016-08-31 ENCOUNTER — Other Ambulatory Visit: Payer: Self-pay | Admitting: Family Medicine

## 2016-10-04 ENCOUNTER — Telehealth: Payer: Self-pay

## 2016-10-04 NOTE — Telephone Encounter (Signed)
Received PA request from [facility] for Venlafaxine er 75 mg capsules. PA approved & form faxed back to pharmacy.

## 2016-10-11 ENCOUNTER — Telehealth: Payer: Self-pay

## 2016-10-11 NOTE — Telephone Encounter (Signed)
[employer] is requesting a PA on pt's Venlafaxine 75mg . Pt is taking 2 capsules daily and they are only covering one capsule daily. Need to increase to #60 a month. Thanks.

## 2016-10-11 NOTE — Telephone Encounter (Signed)
This PA was already approved a couple days ago.

## 2016-10-12 NOTE — Telephone Encounter (Signed)
error 

## 2016-11-07 ENCOUNTER — Other Ambulatory Visit: Payer: Self-pay | Admitting: Family Medicine

## 2016-11-07 DIAGNOSIS — Z1231 Encounter for screening mammogram for malignant neoplasm of breast: Secondary | ICD-10-CM

## 2016-11-27 ENCOUNTER — Ambulatory Visit: Payer: Medicare Other

## 2016-12-11 ENCOUNTER — Ambulatory Visit (INDEPENDENT_AMBULATORY_CARE_PROVIDER_SITE_OTHER): Payer: Medicare Other | Admitting: Family Medicine

## 2016-12-11 ENCOUNTER — Encounter: Payer: Self-pay | Admitting: Family Medicine

## 2016-12-11 VITALS — BP 120/72 | HR 82 | Temp 98.2°F | Wt 196.0 lb

## 2016-12-11 DIAGNOSIS — R55 Syncope and collapse: Secondary | ICD-10-CM | POA: Diagnosis not present

## 2016-12-11 DIAGNOSIS — R197 Diarrhea, unspecified: Secondary | ICD-10-CM | POA: Diagnosis not present

## 2016-12-11 NOTE — Progress Notes (Signed)
Pre visit review using our clinic review tool, if applicable. No additional management support is needed unless otherwise documented below in the visit note. 

## 2016-12-11 NOTE — Progress Notes (Signed)
Subjective:     Patient ID: Sara Logan, female   DOB: 10/29/1932, 83 y.o.   MRN: [id number]  HPI Patient seen for the following 2 things today:  She relates possibly one month history of frequent loose stools. She generally has 1 or 2 bouts daily and sometimes 2 or 3 - not watery but more loose. These were noted after she returned from trip visit family in [place]. Around March 1 she started taking some omeprazole but has not seemed to help any. She's also noted increased gas. Never any blood in her stools. Good appetite. No weight loss. No fevers or chills. No recent antibiotics. No history of IBS. She's not had any associated nausea, vomiting, or diarrhea. She had colonoscopy around 2008. Other than trip to [place] no other recent travels  Other issue is that she's had couple episodes of near-syncope but no true syncope. She was admitted last September and felt to have some dehydration. She recalls recent episode where she was eating with some friends and had some slight nausea and possibly reflux followed by near-syncope but no true syncope. Symptoms cleared after several minutes. No chest pains. No dyspnea. No focal weakness. She had echocardiogram January 2017 normal ejection fraction no acute abnormalities. She's had previous MRA 1/17 which was unremarkable. She is not aware of any recent chest pains or palpitations. No focal weakness. No speech changes.  Past Medical History:  Diagnosis Date  . Arthritis   . Depression   . Hyperlipidemia   . Osteopenia   . Stroke ([place])   . Thrombocytopenia ([place])   . Vitamin D deficiency   . Wrist fracture 2002   left   Past Surgical History:  Procedure Laterality Date  . APPENDECTOMY  1967  . CARPAL TUNNEL RELEASE  2008   right  . JOINT REPLACEMENT  2002   right total     reports that she has never smoked. She has never used smokeless tobacco. She reports that she does not drink alcohol or use drugs. family history includes Breast cancer in her  mother and sister. Allergies  Allergen Reactions  . Bactrim [Sulfamethoxazole-Trimethoprim] Nausea Only     Review of Systems  Constitutional: Negative for fatigue.  Eyes: Negative for visual disturbance.  Respiratory: Negative for cough, chest tightness, shortness of breath and wheezing.   Cardiovascular: Negative for chest pain, palpitations and leg swelling.  Gastrointestinal: Negative for abdominal pain.  Neurological: Positive for dizziness. Negative for seizures, syncope, weakness, light-headedness and headaches.       Objective:   Physical Exam  Constitutional: She is oriented to person, place, and time. She appears well-developed and well-nourished.  Eyes: Pupils are equal, round, and reactive to light.  Neck: Neck supple. No JVD present. No thyromegaly present.  Cardiovascular: Normal rate and regular rhythm.  Exam reveals no gallop.   Pulmonary/Chest: Effort normal and breath sounds normal. No respiratory distress. She has no wheezes. She has no rales.  Abdominal: Soft. Bowel sounds are normal. She exhibits no distension and no mass. There is no tenderness. There is no rebound and no guarding.  Musculoskeletal: She exhibits no edema.  Neurological: She is alert and oriented to person, place, and time. No cranial nerve deficit. Coordination normal.  No focal weakness.  Gait normal.  No ataxia.       Assessment:     #1 frequent loose stools. Possibly exacerbated by omeprazole.  ? Functional.  #2 history of recurrent dizziness and near syncope-?vasovagal.    Plan:     -  Discontinue omeprazole. She will try over-the-counter Gas-X -Touch base if symptoms not improving after discontinuation of omeprazole -Set up cardiac event monitor -Follow-up immediately for any recurrent episodes of near-syncope -encouraged to stay well hydrated and change positions slowly.  Eulas Post MD [facility] at [facility]

## 2016-12-11 NOTE — Patient Instructions (Signed)
Hold the Omeprazole for now Consider OTC Gas-X.

## 2016-12-12 ENCOUNTER — Ambulatory Visit
Admission: RE | Admit: 2016-12-12 | Discharge: 2016-12-12 | Disposition: A | Discharge status: Home / Self Care | Payer: Medicare Other | Source: Ambulatory Visit | Attending: Family Medicine | Admitting: Family Medicine

## 2016-12-12 ENCOUNTER — Ambulatory Visit (INDEPENDENT_AMBULATORY_CARE_PROVIDER_SITE_OTHER): Payer: Medicare Other

## 2016-12-12 DIAGNOSIS — Z1231 Encounter for screening mammogram for malignant neoplasm of breast: Secondary | ICD-10-CM | POA: Diagnosis not present

## 2016-12-12 DIAGNOSIS — R55 Syncope and collapse: Secondary | ICD-10-CM | POA: Diagnosis not present

## 2016-12-26 ENCOUNTER — Other Ambulatory Visit: Payer: Self-pay | Admitting: Family Medicine

## 2017-01-01 ENCOUNTER — Ambulatory Visit (INDEPENDENT_AMBULATORY_CARE_PROVIDER_SITE_OTHER): Payer: Medicare Other | Admitting: Family Medicine

## 2017-01-01 ENCOUNTER — Encounter: Payer: Self-pay | Admitting: Family Medicine

## 2017-01-01 ENCOUNTER — Ambulatory Visit (INDEPENDENT_AMBULATORY_CARE_PROVIDER_SITE_OTHER)
Admission: RE | Admit: 2017-01-01 | Discharge: 2017-01-01 | Disposition: A | Discharge status: Home / Self Care | Payer: Medicare Other | Source: Ambulatory Visit | Attending: Family Medicine | Admitting: Family Medicine

## 2017-01-01 VITALS — BP 124/70 | HR 98 | Temp 98.4°F | Wt 199.5 lb

## 2017-01-01 DIAGNOSIS — M25552 Pain in left hip: Secondary | ICD-10-CM

## 2017-01-01 NOTE — Patient Instructions (Addendum)
Continue with Tylenol as needed for hip/back pain. Go for X-ray, as discussed.

## 2017-01-01 NOTE — Progress Notes (Signed)
Subjective:     Patient ID: Sara Logan, female   DOB: [birth year], 83 y.o.   MRN: [id number]  HPI Patient seen with some poorly localized pain around her left hip and left lower extremity Pain is mostly hip region radiating somewhat inferior and anterior. She does have some lateral pain as well. She denies any weakness. She describes achy pain which is both at rest but also with movement. She has difficulty with crossing her left lower extremity over her right. She's had previous right total knee replacement. She's tried Tylenol which helped somewhat. Denies any recent injury. Denies any current low back pain though she's had some intermittently in the past.  History of recent near syncopal episodes. She has an event monitor in place currently. She's not had any further episodes since wearing the monitor. She is requesting turning in the monitor this week as she plans to leave out of town to travel to [facility] next week.  Past Medical History:  Diagnosis Date  . Arthritis   . Depression   . Hyperlipidemia   . Osteopenia   . Stroke ([place])   . Thrombocytopenia ([place])   . Vitamin D deficiency   . Wrist fracture 2002   left   Past Surgical History:  Procedure Laterality Date  . APPENDECTOMY  1967  . CARPAL TUNNEL RELEASE  2008   right  . JOINT REPLACEMENT  2002   right total     reports that she has never smoked. She has never used smokeless tobacco. She reports that she does not drink alcohol or use drugs. family history includes Breast cancer in her mother and sister. Allergies  Allergen Reactions  . Bactrim [Sulfamethoxazole-Trimethoprim] Nausea Only     Review of Systems  Constitutional: Negative for fatigue.  Eyes: Negative for visual disturbance.  Respiratory: Negative for cough, chest tightness, shortness of breath and wheezing.   Cardiovascular: Negative for chest pain, palpitations and leg swelling.  Neurological: Negative for dizziness, seizures, syncope, weakness,  light-headedness and headaches.       Objective:   Physical Exam  Constitutional: She appears well-developed and well-nourished.  Eyes: Pupils are equal, round, and reactive to light.  Neck: Neck supple. No JVD present. No thyromegaly present.  Cardiovascular: Normal rate and regular rhythm.  Exam reveals no gallop.   Pulmonary/Chest: Effort normal and breath sounds normal. No respiratory distress. She has no wheezes. She has no rales.  Musculoskeletal: She exhibits no edema.  She has mild tenderness to palpation of the left greater trochanter bursa. She has somewhat limited range of motion with internal or external rotation of the left hip. Straight leg raise are negative.  Neurological: She is alert.       Assessment:     Left hip and lower extremity pain. Etiology unclear. Rule out osteoarthritis of the hip. She has mild tenderness over the bursa region but most her pain is more anterior hip. We also discussed that lumbar nerve impingement could be causing some of her pain    Plan:     -Start with plain films of left hip -Continue Tylenol as needed for pain control -She will schedule follow-up after she returns from [place] to discuss her event monitor and further assess  Eulas Post MD [facility] at [facility]

## 2017-01-02 ENCOUNTER — Telehealth: Payer: Self-pay | Admitting: Family Medicine

## 2017-01-02 NOTE — Telephone Encounter (Signed)
Pt is returning rachel call and needs xray results

## 2017-01-02 NOTE — Telephone Encounter (Signed)
Patient is aware of result

## 2017-01-22 ENCOUNTER — Encounter: Payer: Self-pay | Admitting: Family Medicine

## 2017-01-22 ENCOUNTER — Ambulatory Visit (INDEPENDENT_AMBULATORY_CARE_PROVIDER_SITE_OTHER): Payer: Medicare Other | Admitting: Family Medicine

## 2017-01-22 VITALS — BP 110/70 | HR 68 | Temp 98.5°F | Wt 196.9 lb

## 2017-01-22 DIAGNOSIS — M25552 Pain in left hip: Secondary | ICD-10-CM

## 2017-01-22 DIAGNOSIS — R55 Syncope and collapse: Secondary | ICD-10-CM

## 2017-01-22 DIAGNOSIS — R300 Dysuria: Secondary | ICD-10-CM

## 2017-01-22 LAB — POC URINALSYSI DIPSTICK (AUTOMATED)
Bilirubin, UA: NEGATIVE
Glucose, UA: NEGATIVE
Ketones, UA: 5
Nitrite, UA: NEGATIVE
Protein, UA: 15
Spec Grav, UA: >=1.03 — AB (ref 1.010–1.025)
Urobilinogen, UA: 0.2 E.U./dL
pH, UA: 6 (ref 5.0–8.0)

## 2017-01-22 MED ORDER — CIPROFLOXACIN HCL 500 MG PO TABS: 500.0000 mg | ORAL_TABLET | Freq: Two times a day (BID) | ORAL | 0 refills | Status: DC

## 2017-01-22 NOTE — Patient Instructions (Signed)

## 2017-01-22 NOTE — Progress Notes (Signed)
Pre visit review using our clinic review tool, if applicable. No additional management support is needed unless otherwise documented below in the visit note. 

## 2017-01-22 NOTE — Progress Notes (Signed)
Subjective:     Patient ID: Sara Logan, female   DOB: [birth year], 83 y.o.   MRN: [id number]  HPI Patient seen with one-week history of intermittent burning with urination and frequency. No fevers or chills. No flank pain. No nausea or vomiting. History of frequent UTI in the past. No gross hematuria.  Recent near syncopal episode. Event monitor came back normal with no significant arrhythmias. She's had no further episodes since then. No chest pains.  Recent left hip pains. X-rays revealed no significant degenerative arthritis. Her pains are fairly stable this time. She is taking Tylenol as needed. No recent falls.  Past Medical History:  Diagnosis Date  . Arthritis   . Depression   . Hyperlipidemia   . Osteopenia   . Stroke ([place])   . Thrombocytopenia ([place])   . Vitamin D deficiency   . Wrist fracture 2002   left   Past Surgical History:  Procedure Laterality Date  . APPENDECTOMY  1967  . CARPAL TUNNEL RELEASE  2008   right  . JOINT REPLACEMENT  2002   right total     reports that she has never smoked. She has never used smokeless tobacco. She reports that she does not drink alcohol or use drugs. family history includes Breast cancer in her mother and sister. Allergies  Allergen Reactions  . Bactrim [Sulfamethoxazole-Trimethoprim] Nausea Only     Review of Systems  Constitutional: Negative for appetite change, chills and fever.  Gastrointestinal: Negative for abdominal pain, constipation, diarrhea, nausea and vomiting.  Genitourinary: Positive for dysuria and frequency.  Musculoskeletal: Positive for arthralgias.  Neurological: Negative for dizziness.       Objective:   Physical Exam  Constitutional: She is oriented to person, place, and time. She appears well-developed and well-nourished.  HENT:  Head: Normocephalic and atraumatic.  Neck: Neck supple. No thyromegaly present.  Cardiovascular: Normal rate, regular rhythm and normal heart sounds.    Pulmonary/Chest: Breath sounds normal.  Abdominal: Soft. Bowel sounds are normal. There is no tenderness.  Neurological: She is alert and oriented to person, place, and time. No cranial nerve deficit.       Assessment:     #1 recurrent dysuria. Suspect UTI. She has leukocytes and blood on dipstick  #2 recent near-syncopal episode with no further episodes. Event monitor unremarkable  #3 recent hip pain improved with no evidence for significant degenerative arthritis    Plan:     -Urine culture sent -Cipro 500 mg twice a day for 5 days pending culture results -Increase fluid intake -We had a long discussion with her regarding practical strategies for trying to lose some weight -Routine follow-up in one month to reassess  Eulas Post MD [facility] at [facility]

## 2017-01-23 LAB — URINE CULTURE

## 2017-01-30 ENCOUNTER — Encounter: Payer: Self-pay | Admitting: Family Medicine

## 2017-01-30 ENCOUNTER — Ambulatory Visit (INDEPENDENT_AMBULATORY_CARE_PROVIDER_SITE_OTHER): Payer: Medicare Other | Admitting: Family Medicine

## 2017-01-30 VITALS — BP 110/70 | HR 85 | Temp 99.1°F | Wt 198.0 lb

## 2017-01-30 DIAGNOSIS — M654 Radial styloid tenosynovitis [de Quervain]: Secondary | ICD-10-CM | POA: Diagnosis not present

## 2017-01-30 DIAGNOSIS — H9190 Unspecified hearing loss, unspecified ear: Secondary | ICD-10-CM

## 2017-01-30 MED ORDER — METHYLPREDNISOLONE ACETATE 80 MG/ML IJ SUSP
80.0000 mg | Freq: Once | INTRAMUSCULAR | Status: AC
  Administered 2017-01-30: 80 mg

## 2017-01-30 NOTE — Patient Instructions (Signed)
Alfonse Ras Disease Alfonse Ras disease is inflammation of the tendon on the thumb side of the wrist. Tendons are cords of tissue that connect bones to muscles. The tendons in your hand pass through a tunnel, or sheath. A slippery layer of tissue (synovium) lets the tendons move smoothly in the sheath. With de Quervain disease, the sheath swells or thickens, causing friction and pain. The condition is also called de Quervain tendinosis and de Quervain syndrome. It occurs most often in women who are 48-63 years old. What are the causes? The exact cause of de Quervain disease is not known. It may result from:  Overusing your hands, especially with repetitive motions that involve twisting your hand or using a forceful grip.  Pregnancy.  Rheumatoid disease. What increases the risk? You may have a greater risk for de Quervain disease if you:  Are a middle-aged woman.  Are pregnant.  Have rheumatoid arthritis.  Have diabetes.  Use your hands far more than normal, especially with a tight grip or excessive twisting. What are the signs or symptoms? Pain on the thumb side of your wrist is the main symptom of de Quervain disease. Other signs and symptoms include:  Pain that gets worse when you grasp something or turn your wrist.  Pain that extends up the forearm.  Cysts in the area of the pain.  Swelling of your wrist and hand.  A sensation of snapping in the wrist.  Trouble moving the thumb and wrist. How is this diagnosed? Your health care provider may diagnose de Quervain disease based on your signs and symptoms. A physical exam will also be done. A simple test Wynn Maudlin test) that involves pulling your thumb and wrist to see if this causes pain can help determine whether you have the condition. Sometimes you may need to have an X-ray. How is this treated? Avoiding any activity that causes pain and swelling is the best treatment. Other options include:  Wearing a  splint.  Taking medicine. Anti-inflammatory medicines and corticosteroid injections may reduce inflammation and relieve pain.  Having surgery if other treatments do not work. Follow these instructions at home:  Using ice can be helpful after doing activities that involve the sore wrist. To apply ice to the injured area:  Put ice in a plastic bag.  Place a towel between your skin and the bag.  Leave the ice on for 20 minutes, 2-3 times a day.  Take medicines only as directed by your health care provider.  Wear your splint as directed. This will allow your hand to rest and heal. Contact a health care provider if:  Your pain medicine does not help.  Your pain gets worse.  You develop new symptoms. This information is not intended to replace advice given to you by your health care provider. Make sure you discuss any questions you have with your health care provider. Document Released: 05/22/2001 Document Revised: 02/02/2016 Document Reviewed: 12/30/2013 Elsevier Interactive Patient Education  2017 [employer].

## 2017-01-30 NOTE — Progress Notes (Signed)
Subjective:     Patient ID: Sara Logan, female   DOB: 01/03/1933, 83 y.o.   MRN: [id number]  HPI Patient seen with recurrence of right thumb pain. She had similar occurrence last fall which we diagnosed as tendinitis and this improved following steroid injection. She's tried icing and thumb spica splint for the past 2 months without improvement. No weakness. She is right-hand dominant. No injury. No visible swelling. No bruising. No warmth or erythema.  Patient has some chronic hearing loss and also requesting referral to audiologist  Past Medical History:  Diagnosis Date  . Arthritis   . Depression   . Hyperlipidemia   . Osteopenia   . Stroke ([place])   . Thrombocytopenia ([place])   . Vitamin D deficiency   . Wrist fracture 2002   left   Past Surgical History:  Procedure Laterality Date  . APPENDECTOMY  1967  . CARPAL TUNNEL RELEASE  2008   right  . JOINT REPLACEMENT  2002   right total     reports that she has never smoked. She has never used smokeless tobacco. She reports that she does not drink alcohol or use drugs. family history includes Breast cancer in her mother and sister. Allergies  Allergen Reactions  . Bactrim [Sulfamethoxazole-Trimethoprim] Nausea Only     Review of Systems  Neurological: Negative for weakness and numbness.       Objective:   Physical Exam  Constitutional: She appears well-developed and well-nourished.  Cardiovascular: Normal rate and regular rhythm.   Musculoskeletal:  Right thumb reveals full range of motion. She has some tenderness along the extensor tendon of the right thumb. No distal radial tenderness. No crepitus.       Assessment:     Tendinitis of right thumb not improved with conservative therapy including icing and thumb spica splint    Plan:     -We discussed risk and benefits of steroid injection into tendon sheath including risk of bleeding, bruising, infection. Patient consented. Using 25-gauge 5/8 needle injected 1/2  mL of Depo-Medrol into tendon sheath right thumb.  Pt tolerated well.  She  Will continue with some icing next few days -touch base in one week if not improved.  Eulas Post MD [facility] at [facility]

## 2017-02-01 DIAGNOSIS — H903 Sensorineural hearing loss, bilateral: Secondary | ICD-10-CM | POA: Diagnosis not present

## 2017-02-01 DIAGNOSIS — H9313 Tinnitus, bilateral: Secondary | ICD-10-CM | POA: Diagnosis not present

## 2017-02-03 IMAGING — MR MR MRA HEAD W/O CM
17 of 18 series · 17 of 18 positions shown · IV contrast (multihance)
Comparison: 09/25/2015 head CT.  No comparison brain MR.

CLINICAL DATA: 82-year-old female with hyperlipidemia presenting
with acute onset of right-sided weakness. Subsequent encounter.

EXAM:
MRI HEAD WITHOUT CONTRAST AND MRA HEAD WITHOUT AND MRI NECK WITHOUT
AND WITH CONTRAST
TECHNIQUE: Multiplanar, multiecho pulse sequences of the brain and surrounding
structures were obtained without intravenous contrast. Angiographic
images of the head were obtained using MRA technique without
contrast. Multiplanar, multiecho pulse sequences of the neck and
surrounding structures were obtained without and with intravenous
contrast.
CONTRAST:  20 cc MultiHance.

[Series 4: DWI · axial · 3.6mm · 0.94mm/px · 1 of 86 slices shown (1 of 4)]
[im 1/86]
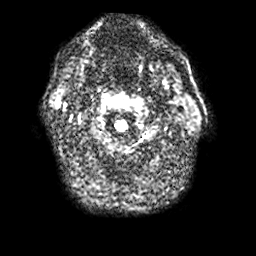

[Series 5: DWI · coronal · 5.0mm · 0.94mm/px · 1 of 68 slices shown (2 of 4)]
[im 1/68]
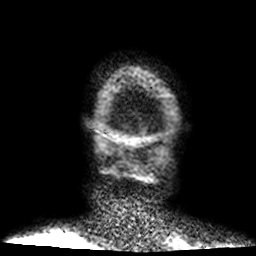

[Series 6: ax (id) 2 · axial · 1.2mm · 0.43mm/px · 1 of 200 slices shown]
[im 1/200]
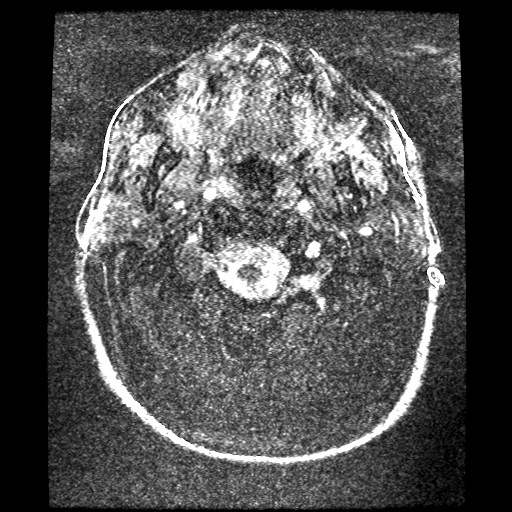

[Series 7: FLAIR · sagittal · 5.0mm · 0.47mm/px · 1 of 23 slices shown (1 of 2)]
[im 1/23]
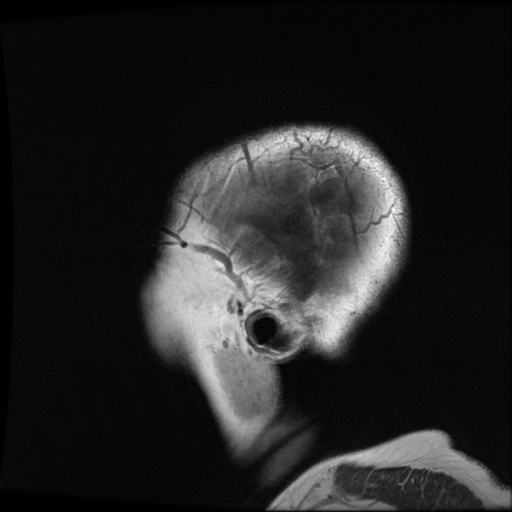

[Series 8: T2 · axial · 5.0mm · 0.47mm/px · 1 of 26 slices shown]
[im 1/26]
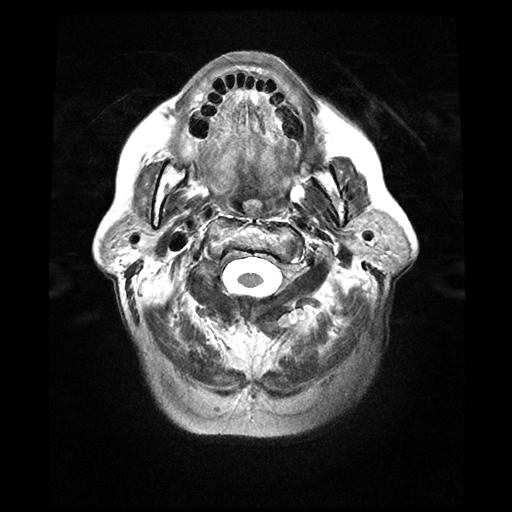

[Series 9: FLAIR · axial · 5.0mm · 0.47mm/px · 1 of 26 slices shown (2 of 2)]
[im 1/26]
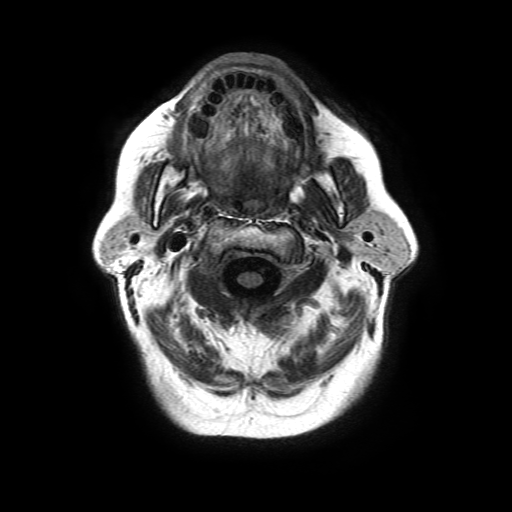

[Series 10: (person_name) · axial · 3.0mm · 0.47mm/px · 1 of 104 slices shown]
[im 1/104]
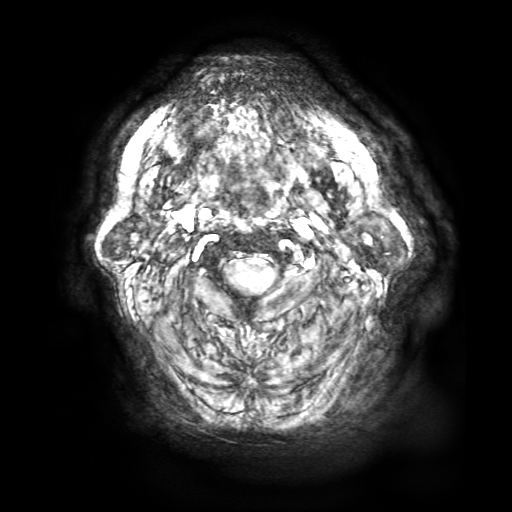

[Series 12: T2 post-contrast · coronal · 5.0mm · 0.39mm/px · 1 of 28 slices shown]
[im 1/28]
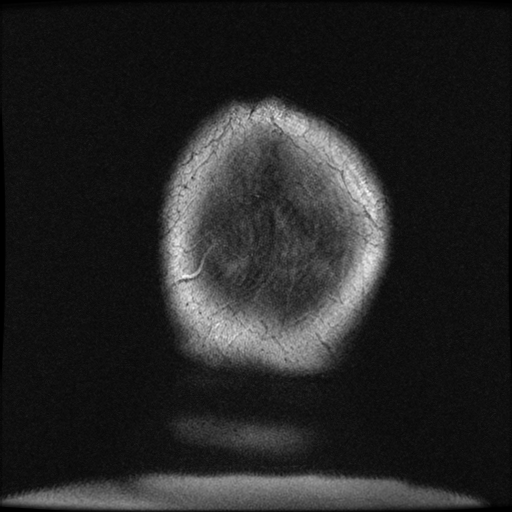

[Series 17: ax (id) acr · axial · 1.5mm · 0.47mm/px · 1 of 129 slices shown]
[im 1/129]
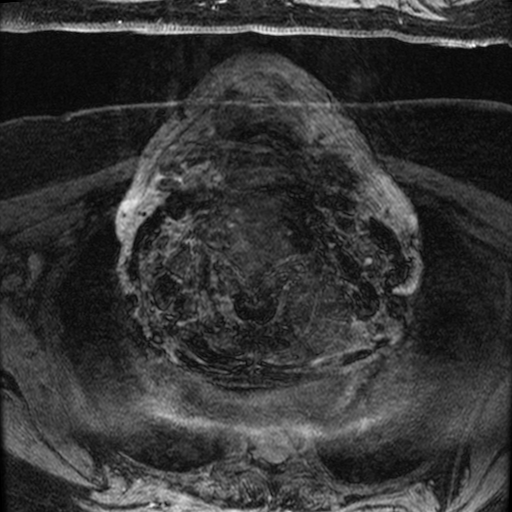

[Series 400: DWI · axial · 3.6mm · 0.94mm/px · 1 of 43 slices shown (3 of 4)]
[im 1/43]
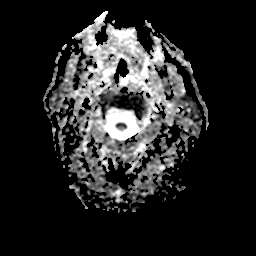

[Series 500: DWI · coronal · 5.0mm · 0.94mm/px · 1 of 34 slices shown (4 of 4)]
[im 1/34]
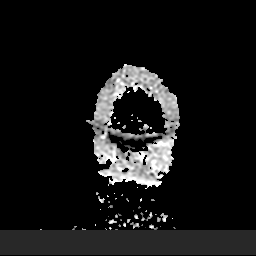

[Series 1000: filt_pha: (person_name) · axial · 3.0mm · 0.47mm/px · 1 of 101 slices shown]
[im 1/101]
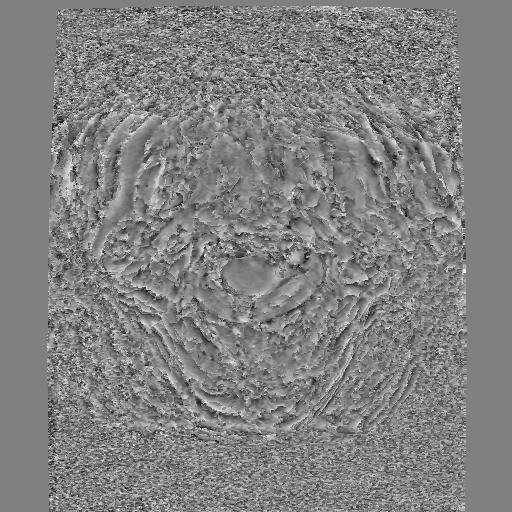

[Series 1800: cor cemra ft · coronal · 1.2mm · 0.59mm/px · 1 of 153 slices shown]
[im 1/153]
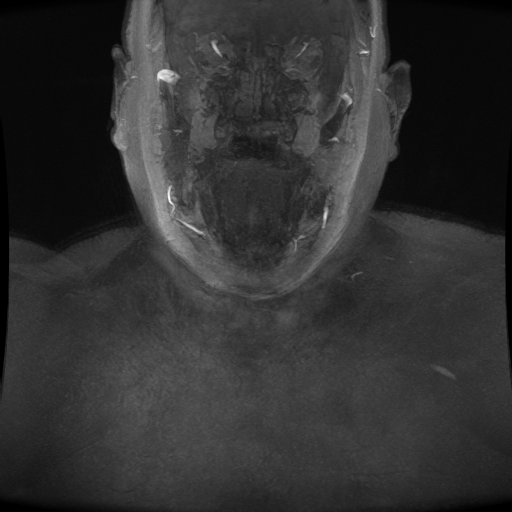

[Series 1801: ph1/cor cemra ft · coronal · 1.2mm · 0.59mm/px · 1 of 152 slices shown]
[im 1/152]
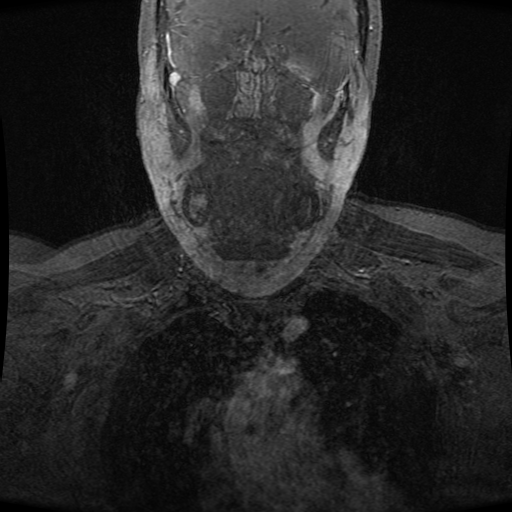

[Series 1802: ph2/cor cemra ft · coronal · 1.2mm · 0.59mm/px · 1 of 152 slices shown]
[im 1/152]
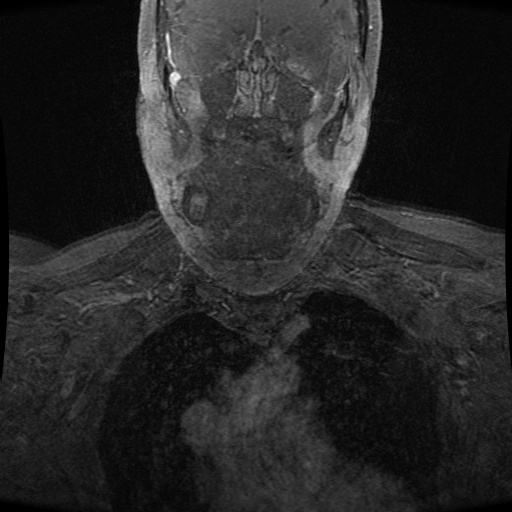

[((date))-((date)) · coronal · 1.2mm · 0.59mm/px · 1 of 151 slices shown (1 of 2)]
[im 1/151]
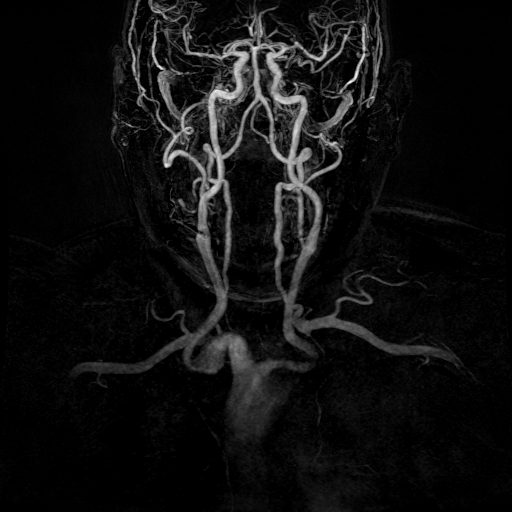

[((date))-((date)) · coronal · 1.2mm · 0.59mm/px · 1 of 153 slices shown (2 of 2)]
[im 1/153]
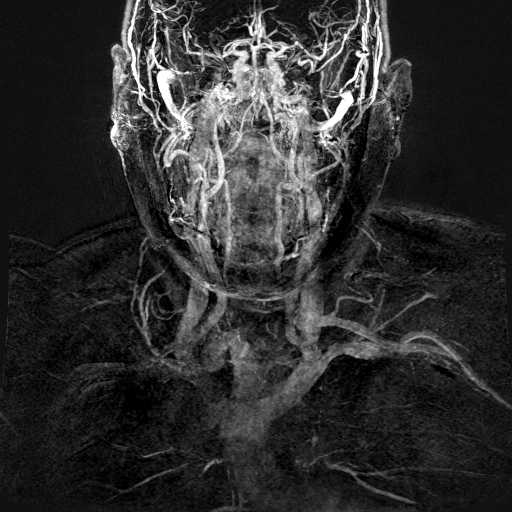

[17 of 18 positions shown; findings below may reference images not displayed]

FINDINGS: MRI HEAD FINDINGS

No acute infarct.

Remote left thalamic infarct.

Prominent chronic small vessel disease changes.

Global atrophy without hydrocephalus.

No intracranial mass lesion noted on this unenhanced exam.

Tiny blood breakdown products within the pons most likely related to
prior episodes hemorrhagic ischemia otherwise no intracranial
hemorrhage.

Post lens replacement otherwise orbital structures unremarkable.

Mild transverse ligament hypertrophy. Cervical medullary junction,
pituitary region and pineal region within normal limits.

Mild mucosal thickening with slightly polypoid appearance inferior
aspect right maxillary sinus.

MRA HEAD FINDINGS

Anterior circulation without medium or large size vessel significant
stenosis or occlusion.

Middle cerebral artery branch vessel mild narrowing and
irregularity.

Minimal bulge left middle cerebral artery bifurcation may represent
a tiny 1.2 mm aneurysm.

No significant stenosis distal vertebral arteries or basilar artery.

Large left posterior inferior cerebellar artery and right anterior
inferior cerebellar artery with nonvisualized right posterior
inferior cerebellar artery and left anterior inferior cerebellar
artery.

Mild narrowing P2 segment left posterior cerebral artery.

MRI NECK FINDINGS

Common origin innominate and left common carotid artery. Ectatic
innominate artery with prominent fold and narrowing.

Prominent ectasia proximal common carotid arteries with fold and
narrowing bilaterally.

No evidence of hemodynamically significant stenosis involving either
carotid bifurcation or internal carotid artery.

Full extent of the left subclavian artery is not included on present
exam. Ectatic right subclavian artery with mild narrowing.

Mild narrowing proximal right vertebral artery. Left vertebral
artery is dominant. Ectatic proximal left vertebral artery.
IMPRESSION: MRI HEAD

No acute infarct.

Remote left thalamic infarct.

Prominent chronic small vessel disease changes.

Global atrophy.

Tiny blood breakdown products within the pons most likely related to
prior episodes hemorrhagic ischemia otherwise no intracranial
hemorrhage.

MRA HEAD

Anterior circulation without medium or large size vessel significant
stenosis or occlusion.

Middle cerebral artery branch vessel mild narrowing and
irregularity.

Minimal bulge left middle cerebral artery bifurcation may represent
a tiny 1.2 mm aneurysm.

No significant stenosis distal vertebral arteries or basilar artery.

Large left posterior inferior cerebellar artery and right anterior
inferior cerebellar artery with nonvisualized right posterior
inferior cerebellar artery and left anterior inferior cerebellar
artery.

Mild narrowing P2 segment left posterior cerebral artery.

MRI NECK

Common origin innominate and left common carotid artery. Ectatic
innominate artery with prominent fold and narrowing.

Prominent ectasia proximal common carotid arteries with fold and
narrowing bilaterally.

No evidence of hemodynamically significant stenosis involving either
carotid bifurcation or internal carotid artery.

Mild narrowing proximal right vertebral artery. Left vertebral
artery is dominant. Ectatic proximal left vertebral artery.

## 2017-03-27 ENCOUNTER — Ambulatory Visit (INDEPENDENT_AMBULATORY_CARE_PROVIDER_SITE_OTHER): Payer: Medicare Other | Admitting: Family Medicine

## 2017-03-27 ENCOUNTER — Encounter: Payer: Self-pay | Admitting: Family Medicine

## 2017-03-27 VITALS — BP 142/78 | HR 94 | Temp 98.2°F | Ht 63.0 in | Wt 198.2 lb

## 2017-03-27 DIAGNOSIS — F32A Depression, unspecified: Secondary | ICD-10-CM

## 2017-03-27 DIAGNOSIS — R5383 Other fatigue: Secondary | ICD-10-CM

## 2017-03-27 DIAGNOSIS — F329 Major depressive disorder, single episode, unspecified: Secondary | ICD-10-CM | POA: Diagnosis not present

## 2017-03-27 DIAGNOSIS — R3915 Urgency of urination: Secondary | ICD-10-CM | POA: Diagnosis not present

## 2017-03-27 MED ORDER — MIRABEGRON ER 25 MG PO TB24: 25.0000 mg | ORAL_TABLET | Freq: Every day | ORAL | 6 refills | Status: DC

## 2017-03-27 NOTE — Progress Notes (Signed)
Subjective:     Patient ID: Sara Logan, female   DOB: [birth year], 83 y.o.   MRN: [id number]  HPI Patient seen today with complaints of fatigue and what she describes as "shallow breathing ". She denies any chest pain or any dyspnea with exertion. She thinks some of this may be anxiety related. She sometimes feels she has difficulty getting a deep breath. No pleuritic pain. No cough. No fevers or chills. No exertional symptoms. No orthopnea. No increased peripheral edema.  Major issue is fatigue. She feels that she has very disrupted sleep because of urine urgency. Usually gets up 4-5 times per night. She's tried to reduce caffeine and also late day intake of fluids. No recent burning with urination. She is very reluctant to try most medications because of fear of anti-cholinergic side effects she has read about previously-Such as constipation and dry mouth..  She feels the fatigue is probably related to disrupted sleep. She has good appetite. No significant weight changes.  Past Medical History:  Diagnosis Date  . Arthritis   . Depression   . Hyperlipidemia   . Osteopenia   . Stroke ([place])   . Thrombocytopenia ([place])   . Vitamin D deficiency   . Wrist fracture 2002   left   Past Surgical History:  Procedure Laterality Date  . APPENDECTOMY  1967  . CARPAL TUNNEL RELEASE  2008   right  . JOINT REPLACEMENT  2002   right total     reports that she has never smoked. She has never used smokeless tobacco. She reports that she does not drink alcohol or use drugs. family history includes Breast cancer in her mother and sister. Allergies  Allergen Reactions  . Bactrim [Sulfamethoxazole-Trimethoprim] Nausea Only      Review of Systems  Constitutional: Positive for fatigue. Negative for chills and fever.  Respiratory: Negative for cough and wheezing.   Cardiovascular: Negative for chest pain.  Gastrointestinal: Negative for abdominal pain.  Genitourinary: Positive for urgency.  Negative for difficulty urinating, dysuria, flank pain and hematuria.  Musculoskeletal: Negative for back pain.  Neurological: Negative for dizziness.       Objective:   Physical Exam  Constitutional: She appears well-developed and well-nourished.  Neck: Neck supple. No thyromegaly present.  Cardiovascular: Normal rate and regular rhythm.   Pulmonary/Chest: Effort normal and breath sounds normal. No respiratory distress. She has no wheezes. She has no rales.  Musculoskeletal: She exhibits no edema.  Psychiatric: She has a normal mood and affect. Her behavior is normal.       Assessment:     #1 urine urgency. She's had this problem for quite some time but progressive with very disrupted sleep.  #2 fatigue suspect largely related to #1 above  #3 history of recurrent depression currently on Effexor    Plan:     -We discussed trial of Myrbetriq 25 mg once daily -Avoid excessive fluids at night and no caffeine after 1 or 2 PM -Follow-up in 2 weeks to reassess. -Consider follow-up labs then if not improved  Eulas Post MD [facility] at [facility]

## 2017-03-27 NOTE — Patient Instructions (Signed)
Try the Myrbetriq for urine frequency/urgency. Let me know in 2 weeks if no improvement with the medication.

## 2017-03-29 ENCOUNTER — Telehealth: Payer: Self-pay | Admitting: *Deleted

## 2017-03-29 NOTE — Telephone Encounter (Signed)
[facility] prescription for mirabegron ER (MYRBETRIQ) 25 MG TB24 tablet is too expensive for the patient, and she is ineligible for a coupon.  Would you like to send in a different prescription?

## 2017-03-29 NOTE — Telephone Encounter (Signed)
She was very concerned about side effects from anti-cholinergics (which is pretty much all other urine urgency drugs).  We do not have generic equivalent to Myrbetriq.

## 2017-04-01 NOTE — Telephone Encounter (Signed)
Spoke with patient and she will discuss with Dr Elease Hashimoto at upcoming appointment.

## 2017-04-10 ENCOUNTER — Ambulatory Visit (INDEPENDENT_AMBULATORY_CARE_PROVIDER_SITE_OTHER): Payer: Medicare Other | Admitting: Family Medicine

## 2017-04-10 ENCOUNTER — Encounter: Payer: Self-pay | Admitting: Family Medicine

## 2017-04-10 VITALS — BP 120/64 | HR 64 | Temp 98.3°F | Wt 202.1 lb

## 2017-04-10 DIAGNOSIS — L603 Nail dystrophy: Secondary | ICD-10-CM | POA: Diagnosis not present

## 2017-04-10 DIAGNOSIS — R3915 Urgency of urination: Secondary | ICD-10-CM | POA: Diagnosis not present

## 2017-04-10 DIAGNOSIS — R5383 Other fatigue: Secondary | ICD-10-CM | POA: Diagnosis not present

## 2017-04-10 LAB — BASIC METABOLIC PANEL
BUN: 16 mg/dL (ref 6–23)
CO2: 28 mEq/L (ref 19–32)
Calcium: 9.3 mg/dL (ref 8.4–10.5)
Chloride: 102 mEq/L (ref 96–112)
Creatinine, Ser: 0.7 mg/dL (ref 0.40–1.20)
GFR: 85.04 mL/min (ref >60.00)
Glucose, Bld: 106 mg/dL — ABNORMAL HIGH (ref 70–99)
Potassium: 3.8 mEq/L (ref 3.5–5.1)
Sodium: 138 mEq/L (ref 135–145)

## 2017-04-10 LAB — TSH: TSH: 2.01 u[IU]/mL (ref 0.35–4.50)

## 2017-04-10 MED ORDER — MIRABEGRON ER 25 MG PO TB24: 25.0000 mg | ORAL_TABLET | Freq: Every day | ORAL | 6 refills | Status: DC

## 2017-04-10 NOTE — Progress Notes (Signed)
Subjective:     Patient ID: Sara Logan, female   DOB: Dec 22, 1932, 83 y.o.   MRN: [id number]  HPI Patient seen for follow-up from last visit with increased fatigue. We suspect that her urine urgency (which is causing nocturia 4-5 times per night) is a strong contributor. She is very leery of taking anticholinergic medications. We prescribed Myrbetriq.  She went to the pharmacy and this was going to be prohibitive because of cost. She is now back for follow-up. She went to donate blood recently and hemoglobin 12.7 last weekend. She does have history of mild hypokalemia with potassium 3.3 last fall. Is not taking any diuretics.  No history of hypothyroidism. Last TSH was 2 years ago. Denies any chest pains. No headaches. No appetite or weight changes. No polyuria or polydipsia.  Thickened painful toenail right second toe. No injury.  Past Medical History:  Diagnosis Date  . Arthritis   . Depression   . Hyperlipidemia   . Osteopenia   . Stroke ([place])   . Thrombocytopenia ([place])   . Vitamin D deficiency   . Wrist fracture 2002   left   Past Surgical History:  Procedure Laterality Date  . APPENDECTOMY  1967  . CARPAL TUNNEL RELEASE  2008   right  . JOINT REPLACEMENT  2002   right total     reports that she has never smoked. She has never used smokeless tobacco. She reports that she does not drink alcohol or use drugs. family history includes Breast cancer in her mother and sister. Allergies  Allergen Reactions  . Bactrim [Sulfamethoxazole-Trimethoprim] Nausea Only     Review of Systems  Constitutional: Positive for fatigue. Negative for appetite change, chills and unexpected weight change.  Respiratory: Negative for cough and shortness of breath.   Cardiovascular: Negative for chest pain.  Gastrointestinal: Negative for abdominal pain.  Genitourinary: Positive for urgency. Negative for dysuria.  Neurological: Negative for dizziness, weakness and headaches.   Psychiatric/Behavioral: Negative for confusion.       Objective:   Physical Exam  Constitutional: She appears well-developed and well-nourished.  Cardiovascular: Normal rate and regular rhythm.   Pulmonary/Chest: Effort normal and breath sounds normal. No respiratory distress. She has no wheezes. She has no rales.  Musculoskeletal: She exhibits no edema.  Skin:  Right second toe very thickened dystrophic nail       Assessment:     #1 fatigue. Suspect related to poor sleep quality related to urine urgency  #2. Urgency which is causing significant nocturia  #3 dystrophic right second toenail. Possible onychomycosis    Plan:     -She plans to see podiatrist regarding her toe -She will check on trying to get Myrbetriq prescription filled again or at least give this a one-month trial -Check TSH and basic metabolic panel. She had recent hemoglobin which was stable at 12.7  Eulas Post MD [facility] at [facility]

## 2017-04-29 ENCOUNTER — Telehealth: Payer: Self-pay | Admitting: Family Medicine

## 2017-04-29 NOTE — Telephone Encounter (Signed)
Pt state that the mirabegron R [facility]) is a little too expensive for her at this present time and she state the pharmacy will keep it on hold for a year just in case she sees that she is able to afford it in the future.

## 2017-06-03 ENCOUNTER — Telehealth: Payer: Self-pay | Admitting: Family Medicine

## 2017-06-03 NOTE — Telephone Encounter (Signed)
error 

## 2017-06-04 ENCOUNTER — Encounter: Payer: Self-pay | Admitting: Family Medicine

## 2017-06-04 ENCOUNTER — Ambulatory Visit (INDEPENDENT_AMBULATORY_CARE_PROVIDER_SITE_OTHER): Payer: Medicare Other | Admitting: Family Medicine

## 2017-06-04 VITALS — BP 110/70 | HR 97 | Temp 98.7°F | Wt 197.0 lb

## 2017-06-04 DIAGNOSIS — R300 Dysuria: Secondary | ICD-10-CM

## 2017-06-04 DIAGNOSIS — Z23 Encounter for immunization: Secondary | ICD-10-CM

## 2017-06-04 DIAGNOSIS — L853 Xerosis cutis: Secondary | ICD-10-CM | POA: Diagnosis not present

## 2017-06-04 DIAGNOSIS — R3915 Urgency of urination: Secondary | ICD-10-CM

## 2017-06-04 DIAGNOSIS — M1712 Unilateral primary osteoarthritis, left knee: Secondary | ICD-10-CM | POA: Diagnosis not present

## 2017-06-04 LAB — POCT URINALYSIS DIPSTICK
Bilirubin, UA: NEGATIVE
Glucose, UA: NEGATIVE
Ketones, UA: NEGATIVE
Nitrite, UA: NEGATIVE
Protein, UA: NEGATIVE
Spec Grav, UA: 1.015 (ref 1.010–1.025)
Urobilinogen, UA: 0.2 E.U./dL
pH, UA: 6.5 (ref 5.0–8.0)

## 2017-06-04 NOTE — Patient Instructions (Signed)
Use moisturizing cream on face- leave off the Bacitracin Continue Tylenol as needed for knee pain We will call with urine culture results.

## 2017-06-04 NOTE — Progress Notes (Signed)
Subjective:     Patient ID: Sara Logan, female   DOB: 12-25-1932, 83 y.o.   MRN: [id number]  HPI Patient seen for several items as follows  Left knee pain. No recent injury. She's had several years of progressive pain. She had prior history of right total knee replacement 2002. She takes Tylenol which helps somewhat. Pain especially when she first gets up in the morning. No bruising. Pain somewhat poorly localized. Moderate severity. Not impairing ambulation much so far  Patient concerned about possible UTI. She's had 2 week history of severe frequency and mild burning with urination but symptoms very inconsistent. No fevers or chills. No gross hematuria.  other issue is some mild skin irritation left face just below the lip. She first noticed this about a week ago. No injury. No vesicle. No pustule. Use some bacitracin topically without much change  Continues to have some urine urgency. She cannot afford Myrbetriq. She has concerns about dry mouth and constipation with other medications for urine urgency. Still gets up about 3-4 times per night  Past Medical History:  Diagnosis Date  . Arthritis   . Depression   . Hyperlipidemia   . Osteopenia   . Stroke ([place])   . Thrombocytopenia ([place])   . Vitamin D deficiency   . Wrist fracture 2002   left   Past Surgical History:  Procedure Laterality Date  . APPENDECTOMY  1967  . CARPAL TUNNEL RELEASE  2008   right  . JOINT REPLACEMENT  2002   right total     reports that she has never smoked. She has never used smokeless tobacco. She reports that she does not drink alcohol or use drugs. family history includes Breast cancer in her mother and sister. Allergies  Allergen Reactions  . Bactrim [Sulfamethoxazole-Trimethoprim] Nausea Only     Review of Systems  Constitutional: Negative for appetite change, chills and fever.  Respiratory: Negative for cough and shortness of breath.   Cardiovascular: Negative for chest pain.   Genitourinary: Positive for dysuria and frequency. Negative for flank pain and hematuria.  Musculoskeletal: Positive for arthralgias.  Neurological: Negative for dizziness.       Objective:   Physical Exam  Constitutional: She appears well-developed and well-nourished.  Cardiovascular: Normal rate and regular rhythm.   Pulmonary/Chest: Effort normal and breath sounds normal. No respiratory distress. She has no wheezes. She has no rales.  Musculoskeletal: She exhibits no edema.  Left knee full range of motion. No effusion. No warmth. No localized tenderness.  Skin:  She has slightly dry patch of skin which is only about 2 x 2 mm left chin region. Nonpustular.       Assessment:     #1 osteoarthritis left knee.  #2 urine frequency. She's had some mild assured past couple weeks. Urine dipstick only shows trace leukocytes and trace blood.  #3 history of chronic urine urgency  #4 dry skin left chin. No sign of secondary infection    Plan:     -Discussed options regarding knee. Focus on strengthening exercises and continue Tylenol -Try over-the-counter hydrocortisone cream for chin dryness -Urine culture sent. No antibiotics unless culture comes back positive or less she develops fever or worsening symptoms  Eulas Post MD [facility] at [facility]

## 2017-06-06 LAB — URINE CULTURE
MICRO NUMBER:: 81060623
SPECIMEN QUALITY:: ADEQUATE

## 2017-06-07 ENCOUNTER — Telehealth: Payer: Self-pay | Admitting: Family Medicine

## 2017-06-07 NOTE — Telephone Encounter (Signed)
Spoke with patient and an appointment made.  Patient will call back and cancel if symptoms improve.

## 2017-06-07 NOTE — Telephone Encounter (Signed)
Pt would like to have her urine culture results

## 2017-06-07 NOTE — Telephone Encounter (Signed)
Patient is aware of lab results.  She states she is having the same symptoms and will like to know if she can leave a  urine sample to the lab?

## 2017-06-07 NOTE — Telephone Encounter (Signed)
Since culture was just done and negative, would not repeat yet unless new symptoms such as fever.

## 2017-06-12 ENCOUNTER — Ambulatory Visit (INDEPENDENT_AMBULATORY_CARE_PROVIDER_SITE_OTHER): Payer: Medicare Other | Admitting: Family Medicine

## 2017-06-12 ENCOUNTER — Encounter: Payer: Self-pay | Admitting: Family Medicine

## 2017-06-12 VITALS — BP 120/70 | HR 89 | Temp 98.3°F | Wt 194.9 lb

## 2017-06-12 DIAGNOSIS — R300 Dysuria: Secondary | ICD-10-CM | POA: Diagnosis not present

## 2017-06-12 DIAGNOSIS — N952 Postmenopausal atrophic vaginitis: Secondary | ICD-10-CM

## 2017-06-12 DIAGNOSIS — K5900 Constipation, unspecified: Secondary | ICD-10-CM | POA: Diagnosis not present

## 2017-06-12 LAB — POCT URINALYSIS DIPSTICK
Bilirubin, UA: NEGATIVE
Glucose, UA: NEGATIVE
Ketones, UA: NEGATIVE
Nitrite, UA: NEGATIVE
Protein, UA: NEGATIVE
Spec Grav, UA: 1.015 (ref 1.010–1.025)
Urobilinogen, UA: 0.2 E.U./dL
pH, UA: 6 (ref 5.0–8.0)

## 2017-06-12 MED ORDER — ESTRADIOL 0.1 MG/GM VA CREA: 1.0000 | TOPICAL_CREAM | VAGINAL | 12 refills | Status: DC

## 2017-06-12 NOTE — Patient Instructions (Signed)
Consider fiber supplement such as Fibercon, Metamucil, or Citrucel Goal fiber intake of 25 grams per day. Start the Estrace cream three times weekly such as Mon, Wednesday, and Friday.

## 2017-06-12 NOTE — Progress Notes (Signed)
Subjective:     Patient ID: Sara Logan, female   DOB: 1933/03/23, 83 y.o.   MRN: [id number]  HPI Patient seen for the following items  History of frequent burning with urination. She's had multiple cultures that have been negative including recent culture. She has some urine frequency and burning which is intermittent. No fevers or chills. No flank pain. No hematuria. No vaginal discharge.  Second issue is patient states that she has tendencies toward loose stools at times. She will sometimes have constipation with straining and after straining hard stool frequently has some loose stool following that. No bloody stools. She had colonoscopy several years ago. Because of age would not get further screening. No appetite or weight changes. Tries to drink plenty of fluids. No regular exercise. No localizing abdominal pain.  Past Medical History:  Diagnosis Date  . Arthritis   . Depression   . Hyperlipidemia   . Osteopenia   . Stroke ([place])   . Thrombocytopenia ([place])   . Vitamin D deficiency   . Wrist fracture 2002   left   Past Surgical History:  Procedure Laterality Date  . APPENDECTOMY  1967  . CARPAL TUNNEL RELEASE  2008   right  . JOINT REPLACEMENT  2002   right total     reports that she has never smoked. She has never used smokeless tobacco. She reports that she does not drink alcohol or use drugs. family history includes Breast cancer in her mother and sister. Allergies  Allergen Reactions  . Bactrim [Sulfamethoxazole-Trimethoprim] Nausea Only     Review of Systems  Constitutional: Negative for appetite change, chills and fever.  Gastrointestinal: Positive for constipation. Negative for abdominal pain, diarrhea, nausea and vomiting.  Genitourinary: Positive for dysuria and frequency. Negative for hematuria.  Musculoskeletal: Negative for back pain.  Neurological: Negative for dizziness.       Objective:   Physical Exam  Constitutional: She appears well-developed.   Cardiovascular: Normal rate and regular rhythm.   Pulmonary/Chest: Effort normal and breath sounds normal. No respiratory distress. She has no wheezes. She has no rales.  Abdominal: Soft. There is no tenderness.       Assessment:     #1 recurrent dysuria. Most of her recent cultures been negative. Suspect component of atrophic vaginitis  #2 intermittent constipation followed by loose stools.    Plan:     -Recommend trial of Estrace vaginal cream 3 times weekly on Monday, Wednesday, and Friday -We suggested trial of fiber supplement such as Citrucel, Metamucil, or FiberCon. -Drink plenty of fluids -Reassess in 2 weeks  Eulas Post MD [facility] at [facility]

## 2017-06-13 ENCOUNTER — Ambulatory Visit (INDEPENDENT_AMBULATORY_CARE_PROVIDER_SITE_OTHER): Payer: Medicare Other

## 2017-06-13 VITALS — BP 120/64 | HR 83 | Ht 63.0 in | Wt 195.0 lb

## 2017-06-13 DIAGNOSIS — Z0000 Encounter for general adult medical examination without abnormal findings: Secondary | ICD-10-CM | POA: Diagnosis not present

## 2017-06-13 NOTE — Patient Instructions (Addendum)
Sara Logan , Thank you for taking time to come for your Medicare Wellness Visit. I appreciate your ongoing commitment to your health goals. Please review the following plan we discussed and let me know if I can assist you in the future.   Contact [employer]? at: [email or web address] or [phone]  Monday - Friday 9 AM to 8 PM ET  Will take PSV 23; last pneumonia on 10/17 when you see Dr. Elease Logan   A Tetanus is recommended every 10 years. Medicare covers a tetanus if you have a cut or wound; otherwise, there may be a charge. If you had not had a tetanus with pertusses, known as the Tdap, you can take this anytime.   You can have a hearing screen and Medicare will pay. [facility] & [facility] of Hearing [facility] - can assist with hearing aid x 1  No reviews  [facility]  [place] [address]  [phone]  - Sara Logan is contact   Shingrix is a vaccine for the prevention of Shingles in Adults 50 and older.  If you are on Medicare, you can request a prescription from your doctor to be filled at a pharmacy.  Please check with your benefits regarding applicable copays or out of pocket expenses.  The Shingrix is given in 2 vaccines approx 8 weeks apart. You must receive the 2nd dose prior to 6 months from receipt of the first.    These are the goals we discussed: Goals    . Exercise 150 minutes per week (moderate activity)          Exercise Will do more walking; can walk indoors or outdoors. Once a day; 30 minutes     . Exercise 150 minutes per week (moderate activity)          Try a few exercise classes and walking and see what you like Do this just as you need to go to the cafeteria and eat        This is a list of the screening recommended for you and due dates:  Health Maintenance  Topic Date Due  . Pneumonia vaccines (2 of 2 - PPSV23) 04/28/2015  . Tetanus Vaccine  09/11/2015  . Flu Shot  Completed  . DEXA scan  (bone density measurement)  Completed     Prevention of falls: Remove rugs or any tripping hazards in the home Use Non slip mats in bathtubs and showers Placing grab bars next to the toilet and or shower Placing handrails on both sides of the stair way Adding extra lighting in the home.   Personal safety issues reviewed:  1. Consider starting a community watch program per [facility] 2.  Changes batteries is smoke detector and/or carbon monoxide detector  3.  If you have firearms; keep them in a safe place 4.  Wear protection when in the sun; Always wear sunscreen or a hat; It is good to have your doctor check your skin annually or review any new areas of concern 5. Driving safety; Keep in the right lane; stay 3 car lengths behind the car in front of you on the highway; look 3 times prior to pulling out; carry your cell phone everywhere you go!    Learn about the Yellow Dot program:  The program allows first responders at your emergency to have access to who your physician is, as well as your medications and medical conditions.  Citizens requesting the Yellow Dot Packages should contact  Master Corporal Sara Logan at the [facility] [phone] for the first week of the program and beginning the week after Easter citizens should contact their Scientist, physiological.       Fall Prevention in the Home Falls can cause injuries. They can happen to people of all ages. There are many things you can do to make your home safe and to help prevent falls. What can I do on the outside of my home?  Regularly fix the edges of walkways and driveways and fix any cracks.  Remove anything that might make you trip as you walk through a door, such as a raised step or threshold.  Trim any bushes or trees on the path to your home.  Use bright outdoor lighting.  Clear any walking paths of anything that might make someone trip, such as rocks or  tools.  Regularly check to see if handrails are loose or broken. Make sure that both sides of any steps have handrails.  Any raised decks and porches should have guardrails on the edges.  Have any leaves, snow, or ice cleared regularly.  Use sand or salt on walking paths during winter.  Clean up any spills in your garage right away. This includes oil or grease spills. What can I do in the bathroom?  Use night lights.  Install grab bars by the toilet and in the tub and shower. Do not use towel bars as grab bars.  Use non-skid mats or decals in the tub or shower.  If you need to sit down in the shower, use a plastic, non-slip stool.  Keep the floor dry. Clean up any water that spills on the floor as soon as it happens.  Remove soap buildup in the tub or shower regularly.  Attach bath mats securely with double-sided non-slip rug tape.  Do not have throw rugs and other things on the floor that can make you trip. What can I do in the bedroom?  Use night lights.  Make sure that you have a light by your bed that is easy to reach.  Do not use any sheets or blankets that are too big for your bed. They should not hang down onto the floor.  Have a firm chair that has side arms. You can use this for support while you get dressed.  Do not have throw rugs and other things on the floor that can make you trip. What can I do in the kitchen?  Clean up any spills right away.  Avoid walking on wet floors.  Keep items that you use a lot in easy-to-reach places.  If you need to reach something above you, use a strong step stool that has a grab bar.  Keep electrical cords out of the way.  Do not use floor polish or wax that makes floors slippery. If you must use wax, use non-skid floor wax.  Do not have throw rugs and other things on the floor that can make you trip. What can I do with my stairs?  Do not leave any items on the stairs.  Make sure that there are handrails on both  sides of the stairs and use them. Fix handrails that are broken or loose. Make sure that handrails are as long as the stairways.  Check any carpeting to make sure that it is firmly attached to the stairs. Fix any carpet that is loose or worn.  Avoid having throw rugs at the top or bottom of the  stairs. If you do have throw rugs, attach them to the floor with carpet tape.  Make sure that you have a light switch at the top of the stairs and the bottom of the stairs. If you do not have them, ask someone to add them for you. What else can I do to help prevent falls?  Wear shoes that: ? Do not have high heels. ? Have rubber bottoms. ? Are comfortable and fit you well. ? Are closed at the toe. Do not wear sandals.  If you use a stepladder: ? Make sure that it is fully opened. Do not climb a closed stepladder. ? Make sure that both sides of the stepladder are locked into place. ? Ask someone to hold it for you, if possible.  Clearly mark and make sure that you can see: ? Any grab bars or handrails. ? First and last steps. ? Where the edge of each step is.  Use tools that help you move around (mobility aids) if they are needed. These include: ? Canes. ? Walkers. ? Scooters. ? Crutches.  Turn on the lights when you go into a dark area. Replace any light bulbs as soon as they burn out.  Set up your furniture so you have a clear path. Avoid moving your furniture around.  If any of your floors are uneven, fix them.  If there are any pets around you, be aware of where they are.  Review your medicines with your doctor. Some medicines can make you feel dizzy. This can increase your chance of falling. Ask your doctor what other things that you can do to help prevent falls. This information is not intended to replace advice given to you by your health care provider. Make sure you discuss any questions you have with your health care provider. Document Released: 06/23/2009 Document Revised:  02/02/2016 Document Reviewed: 10/01/2014 Elsevier Interactive Patient Education  2018 [place] Maintenance, Female Adopting a healthy lifestyle and getting preventive care can go a long way to promote health and wellness. Talk with your health care provider about what schedule of regular examinations is right for you. This is a good chance for you to check in with your provider about disease prevention and staying healthy. In between checkups, there are plenty of things you can do on your own. Experts have done a lot of research about which lifestyle changes and preventive measures are most likely to keep you healthy. Ask your health care provider for more information. Weight and diet Eat a healthy diet  Be sure to include plenty of vegetables, fruits, low-fat dairy products, and lean protein.  Do not eat a lot of foods high in solid fats, added sugars, or salt.  Get regular exercise. This is one of the most important things you can do for your health. ? Most adults should exercise for at least 150 minutes each week. The exercise should increase your heart rate and make you sweat (moderate-intensity exercise). ? Most adults should also do strengthening exercises at least twice a week. This is in addition to the moderate-intensity exercise.  Maintain a healthy weight  Body mass index (BMI) is a measurement that can be used to identify possible weight problems. It estimates body fat based on height and weight. Your health care provider can help determine your BMI and help you achieve or maintain a healthy weight.  For females 1 years of age and older: ? A BMI below 18.5 is considered underweight. ? A BMI  of 18.5 to 24.9 is normal. ? A BMI of 25 to 29.9 is considered overweight. ? A BMI of 30 and above is considered obese.  Watch levels of cholesterol and blood lipids  You should start having your blood tested for lipids and cholesterol at 81 years of age, then have this test  every 5 years.  You may need to have your cholesterol levels checked more often if: ? Your lipid or cholesterol levels are high. ? You are older than 81 years of age. ? You are at high risk for heart disease.  Cancer screening Lung Cancer  Lung cancer screening is recommended for adults 70-65 years old who are at high risk for lung cancer because of a history of smoking.  A yearly low-dose CT scan of the lungs is recommended for people who: ? Currently smoke. ? Have quit within the past 15 years. ? Have at least a 30-pack-year history of smoking. A pack year is smoking an average of one pack of cigarettes a day for 1 year.  Yearly screening should continue until it has been 15 years since you quit.  Yearly screening should stop if you develop a health problem that would prevent you from having lung cancer treatment.  Breast Cancer  Practice breast self-awareness. This means understanding how your breasts normally appear and feel.  It also means doing regular breast self-exams. Let your health care provider know about any changes, no matter how small.  If you are in your 20s or 30s, you should have a clinical breast exam (CBE) by a health care provider every 1-3 years as part of a regular health exam.  If you are 75 or older, have a CBE every year. Also consider having a breast X-ray (mammogram) every year.  If you have a family history of breast cancer, talk to your health care provider about genetic screening.  If you are at high risk for breast cancer, talk to your health care provider about having an MRI and a mammogram every year.  Breast cancer gene (BRCA) assessment is recommended for women who have family members with BRCA-related cancers. BRCA-related cancers include: ? Breast. ? Ovarian. ? Tubal. ? Peritoneal cancers.  Results of the assessment will determine the need for genetic counseling and BRCA1 and BRCA2 testing.  Cervical Cancer Your health care provider  may recommend that you be screened regularly for cancer of the pelvic organs (ovaries, uterus, and vagina). This screening involves a pelvic examination, including checking for microscopic changes to the surface of your cervix (Pap test). You may be encouraged to have this screening done every 3 years, beginning at age 79.  For women ages 28-65, health care providers may recommend pelvic exams and Pap testing every 3 years, or they may recommend the Pap and pelvic exam, combined with testing for human papilloma virus (HPV), every 5 years. Some types of HPV increase your risk of cervical cancer. Testing for HPV may also be done on women of any age with unclear Pap test results.  Other health care providers may not recommend any screening for nonpregnant women who are considered low risk for pelvic cancer and who do not have symptoms. Ask your health care provider if a screening pelvic exam is right for you.  If you have had past treatment for cervical cancer or a condition that could lead to cancer, you need Pap tests and screening for cancer for at least 20 years after your treatment. If Pap tests have been discontinued,  your risk factors (such as having a new sexual partner) need to be reassessed to determine if screening should resume. Some women have medical problems that increase the chance of getting cervical cancer. In these cases, your health care provider may recommend more frequent screening and Pap tests.  Colorectal Cancer  This type of cancer can be detected and often prevented.  Routine colorectal cancer screening usually begins at 81 years of age and continues through 81 years of age.  Your health care provider may recommend screening at an earlier age if you have risk factors for colon cancer.  Your health care provider may also recommend using home test kits to check for hidden blood in the stool.  A small camera at the end of a tube can be used to examine your colon directly  (sigmoidoscopy or colonoscopy). This is done to check for the earliest forms of colorectal cancer.  Routine screening usually begins at age 23.  Direct examination of the colon should be repeated every 5-10 years through 81 years of age. However, you may need to be screened more often if early forms of precancerous polyps or small growths are found.  Skin Cancer  Check your skin from head to toe regularly.  Tell your health care provider about any new moles or changes in moles, especially if there is a change in a mole's shape or color.  Also tell your health care provider if you have a mole that is larger than the size of a pencil eraser.  Always use sunscreen. Apply sunscreen liberally and repeatedly throughout the day.  Protect yourself by wearing long sleeves, pants, a wide-brimmed hat, and sunglasses whenever you are outside.  Heart disease, diabetes, and high blood pressure  High blood pressure causes heart disease and increases the risk of stroke. High blood pressure is more likely to develop in: ? People who have blood pressure in the high end of the normal range (130-139/85-89 mm Hg). ? People who are overweight or obese. ? People who are African American.  If you are 20-60 years of age, have your blood pressure checked every 3-5 years. If you are 63 years of age or older, have your blood pressure checked every year. You should have your blood pressure measured twice-once when you are at a hospital or clinic, and once when you are not at a hospital or clinic. Record the average of the two measurements. To check your blood pressure when you are not at a hospital or clinic, you can use: ? An automated blood pressure machine at a pharmacy. ? A home blood pressure monitor.  If you are between 57 years and 34 years old, ask your health care provider if you should take aspirin to prevent strokes.  Have regular diabetes screenings. This involves taking a blood sample to check your  fasting blood sugar level. ? If you are at a normal weight and have a low risk for diabetes, have this test once every three years after 81 years of age. ? If you are overweight and have a high risk for diabetes, consider being tested at a younger age or more often. Preventing infection Hepatitis B  If you have a higher risk for hepatitis B, you should be screened for this virus. You are considered at high risk for hepatitis B if: ? You were born in a country where hepatitis B is common. Ask your health care provider which countries are considered high risk. ? Your parents were born in a  high-risk country, and you have not been immunized against hepatitis B (hepatitis B vaccine). ? You have HIV or AIDS. ? You use needles to inject street drugs. ? You live with someone who has hepatitis B. ? You have had sex with someone who has hepatitis B. ? You get hemodialysis treatment. ? You take certain medicines for conditions, including cancer, organ transplantation, and autoimmune conditions.  Hepatitis C  Blood testing is recommended for: ? Everyone born from [birth year] through 1963. ? Anyone with known risk factors for hepatitis C.  Sexually transmitted infections (STIs)  You should be screened for sexually transmitted infections (STIs) including gonorrhea and chlamydia if: ? You are sexually active and are younger than 81 years of age. ? You are older than 81 years of age and your health care provider tells you that you are at risk for this type of infection. ? Your sexual activity has changed since you were last screened and you are at an increased risk for chlamydia or gonorrhea. Ask your health care provider if you are at risk.  If you do not have HIV, but are at risk, it may be recommended that you take a prescription medicine daily to prevent HIV infection. This is called pre-exposure prophylaxis (PrEP). You are considered at risk if: ? You are sexually active and do not regularly use condoms  or know the HIV status of your partner(s). ? You take drugs by injection. ? You are sexually active with a partner who has HIV.  Talk with your health care provider about whether you are at high risk of being infected with HIV. If you choose to begin PrEP, you should first be tested for HIV. You should then be tested every 3 months for as long as you are taking PrEP. Pregnancy  If you are premenopausal and you may become pregnant, ask your health care provider about preconception counseling.  If you may become pregnant, take 400 to 800 micrograms (mcg) of folic acid every day.  If you want to prevent pregnancy, talk to your health care provider about birth control (contraception). Osteoporosis and menopause  Osteoporosis is a disease in which the bones lose minerals and strength with aging. This can result in serious bone fractures. Your risk for osteoporosis can be identified using a bone density scan.  If you are 69 years of age or older, or if you are at risk for osteoporosis and fractures, ask your health care provider if you should be screened.  Ask your health care provider whether you should take a calcium or vitamin D supplement to lower your risk for osteoporosis.  Menopause may have certain physical symptoms and risks.  Hormone replacement therapy may reduce some of these symptoms and risks. Talk to your health care provider about whether hormone replacement therapy is right for you. Follow these instructions at home:  Schedule regular health, dental, and eye exams.  Stay current with your immunizations.  Do not use any tobacco products including cigarettes, chewing tobacco, or electronic cigarettes.  If you are pregnant, do not drink alcohol.  If you are breastfeeding, limit how much and how often you drink alcohol.  Limit alcohol intake to no more than 1 drink per day for nonpregnant women. One drink equals 12 ounces of beer, 5 ounces of wine, or 1 ounces of hard  liquor.  Do not use street drugs.  Do not share needles.  Ask your health care provider for help if you need support or information about quitting  drugs.  Tell your health care provider if you often feel depressed.  Tell your health care provider if you have ever been abused or do not feel safe at home. This information is not intended to replace advice given to you by your health care provider. Make sure you discuss any questions you have with your health care provider. Document Released: 03/12/2011 Document Revised: 02/02/2016 Document Reviewed: 05/31/2015 Elsevier Interactive Patient Education  [employer].

## 2017-06-13 NOTE — Progress Notes (Addendum)
Subjective:   Sara Logan is a 83 y.o. female who presents for Medicare Annual (Subsequent) preventive examination.  The Patient was informed that the wellness visit is to identify future health risk and educate and initiate measures that can reduce risk for increased disease through the lifespan.    Annual Wellness Assessment  Reports health as   Preventive Screening -Counseling & Management  Medicare Annual Preventive Care Visit - Subsequent Last OV was here yesterday    Was in for AWV [facility] when she had her stroke  Spoke to Dr. Elease Hashimoto about her diarrhea Was told to start fiber;    VS reviewed;   Diet  [facility] and she likes it there dtr is on [place] but has not relatives here Sister is L[place] is helping monetarily so she is using this to assist with meals  Breakfast; now eating cheerios' milk, yogurt 1/2 banana Lunch; varies if she eats lunch or dinner at assisted living Noodles and oranges today  Did have jello; chicken; potato and green beans  Supper will eat dinner there tonight    BMI 34   Exercise No exercise;Exercise offered there; some OA in right side  Can walk the entire community without having to go outside Recommended exercise classes or any exercise of any type consistently  Advised her to see the activities director at the facility   Hobbies;  Likes to travel to [place] and [place]; [place] to [place] early in their marriage ;  [place] first of July to [place] in Sept Meets sister from [place] in [place]; Did not take mirabegron er due to expense Given pharmaceutical Rx assistance program  Started Estrace vaginal as ordered by Dr. Elease Hashimoto for vaginal dryness    Dental Dentist: Dr. Dub Amis; [phone] Regular dental checks   Stressors:  Shanon Brow (son) died in 2022-09-27 years ago Spouse died approx 2 yo   Sleep patterns: not sleeping  well due to having to go to the bathroom all night   Pain- OA on the right    Advanced Directives completed    Patient Care Team: Eulas Post, MD as PCP - General      Cardiac Risk Factors include: advanced age (>65men, >54 women)     Objective:     Vitals: BP 120/64   Pulse 83   Ht 5\' 3"  (1.6 m)   Wt 195 lb (88.5 kg)   SpO2 93%   BMI 34.54 kg/m   Body mass index is 34.54 kg/m.   Tobacco History  Smoking Status  . Never Smoker  Smokeless Tobacco  . Never Used     Counseling given: Yes   Past Medical History:  Diagnosis Date  . Arthritis   . Depression   . Hyperlipidemia   . Osteopenia   . Stroke ([place])   . Thrombocytopenia ([place])   . Vitamin D deficiency   . Wrist fracture 2002   left   Past Surgical History:  Procedure Laterality Date  . APPENDECTOMY  1967  . CARPAL TUNNEL RELEASE  2008   right  . JOINT REPLACEMENT  2002   right total    Family History  Problem Relation Age of Onset  . Breast cancer Mother   . Breast cancer Sister    History  Sexual Activity  . Sexual activity: Not on file    Outpatient Encounter Prescriptions as of  06/13/2017  Medication Sig  . amLODipine (NORVASC) 5 MG tablet TAKE 1 TABLET ONCE DAILY.  Marland Kitchen aspirin EC 325 MG EC tablet Take 1 tablet (325 mg total) by mouth daily.  . Biotin 10 MG TABS Take 10 mg by mouth daily.   . Cholecalciferol (VITAMIN D) 2000 UNITS CAPS Take 2,000 Units by mouth daily.   Marland Kitchen estradiol (ESTRACE VAGINAL) 0.1 MG/GM vaginal cream Place 1 Applicatorful vaginally 3 (three) times a week.  . Misc Natural Products (APPLE CIDER VINEGAR DIET PO) Take 200 mg by mouth.  Marland Kitchen Specialty Vitamins Products (MAGNESIUM, AMINO ACID CHELATE,) 133 MG tablet Take 1 tablet by mouth daily.  Marland Kitchen venlafaxine XR (EFFEXOR-XR) 75 MG 24 hr capsule TAKE 2 CAPSULES (150MG ) BY MOUTH DAILY.  . vitamin C (ASCORBIC ACID) 500 MG tablet Take 500 mg by mouth daily.  . vitamin E 100 UNIT capsule Take 100 Units by mouth daily.   . Zinc Sulfate (ZINC 15 PO) Take by mouth.  . mirabegron ER (MYRBETRIQ) 25 MG TB24 tablet Take 1 tablet (25 mg total) by mouth daily. (Patient not taking: Reported on 06/13/2017)   No facility-administered encounter medications on file as of 06/13/2017.     Activities of Daily Living In your present state of health, do you have any difficulty performing the following activities: 06/13/2017  Hearing? Y  Vision? N  Difficulty concentrating or making decisions? N  Walking or climbing stairs? Y  Dressing or bathing? N  Doing errands, shopping? N  Preparing Food and eating ? N  Using the Toilet? Y  In the past six months, have you accidently leaked urine? Y  Do you have problems with loss of bowel control? Y  Comment under medical management   Managing your Medications? N  Managing your Finances? N  Housekeeping or managing your Housekeeping? N  Some recent data might be hidden    Patient Care Team: Eulas Post, MD as PCP - General    Assessment:     Exercise Activities and Dietary recommendations Current Exercise Habits: Structured exercise class, Time (Minutes): 30, Frequency (Times/Week): 3, Weekly Exercise (Minutes/Week): 90  Goals    . Exercise 150 minutes per week (moderate activity)          Exercise Will do more walking; can walk indoors or outdoors. Once a day; 30 minutes     . Exercise 150 minutes per week (moderate activity)          Try a few exercise classes and walking and see what you like Do this just as you need to go to the cafeteria and eat       Fall Risk Fall Risk  06/13/2017 06/18/2016 06/08/2016 12/08/2015 03/18/2015  Falls in the past year? No No Yes No No  Number falls in past yr: - - 2 or more - -  Risk for fall due to : - - (No Data) - -  Risk for fall due to: Comment - - feels she needs to take care of herself  - -  Follow up - - Education provided - -   Depression Screen PHQ 2/9 Scores 06/13/2017 06/08/2016 03/18/2015 08/07/2013  PHQ - 2  Score 0 2 0 0  PHQ- 9 Score - 4 - -     Cognitive Function MMSE - Mini Mental State Exam 06/13/2017 06/08/2016  Not completed: (No Data) (No Data)     Ad8 score reviewed for issues:  Issues making decisions:  Less interest in hobbies / activities:  Repeats questions, stories (family complaining):  Trouble using ordinary gadgets (microwave, computer, phone):  Forgets the month or year:   Mismanaging finances:   Remembering appts:  Daily problems with thinking and/or memory: Ad8 score is=0 Can travel successfully         Immunization History  Administered Date(s) Administered  . Influenza Split 06/18/2012  . Influenza Whole 07/11/2010  . Influenza, High Dose Seasonal PF 07/07/2013, 07/14/2014, 06/24/2015, 06/13/2016, 06/04/2017  . Pneumococcal Conjugate-13 04/27/2014  . Td 09/10/2005  . Zoster 11/29/2006   Screening Tests Health Maintenance  Topic Date Due  . TETANUS/TDAP  09/11/2015  . PNA vac Low Risk Adult (2 of 2 - PPSV23) 06/26/2017 (Originally 04/28/2015)  . INFLUENZA VACCINE  Completed  . DEXA SCAN  Completed      Plan:      PCP Notes  Health Maintenance  Discussed PSV 23; not sure she ever had this Had the prevnar 2015; discussed the PSV 23 in lieu of your flu vaccine last week; would prefer to wait 2 weeks in between vaccinations unless given on the same day.  Will plan to take PSV during scheduled OV on 10/17  Will need Tdap; the one w pertussis   She is not taking Myrbetriq due to cost, given information on pharmacy assistance for this med and can fup  Discussed adverse SE of other med and not sure she would elect to do this.  Will discuss with Dr. Elease Hashimoto on her OV on 10/17  Abnormal Screens  States her hearing is worse Will have hearing checked; ( or Dr. Elease Hashimoto to check her ears)  Given information on hearing aid assistance   Referrals  Hearing issues suggested   Patient concerns; Bladder control and expensive of medicine as  noted   Nurse Concerns; Not exercising; encouraged her to get moving   Next PCP apt 10/17      I have personally reviewed and noted the following in the patient's chart:   . Medical and social history . Use of alcohol, tobacco or illicit drugs  . Current medications and supplements . Functional ability and status . Nutritional status . Physical activity . Advanced directives . List of other physicians . Hospitalizations, surgeries, and ER visits in previous 12 months . Vitals . Screenings to include cognitive, depression, and falls . Referrals and appointments  In addition, I have reviewed and discussed with patient certain preventive protocols, quality metrics, and best practice recommendations. A written personalized care plan for preventive services as well as general preventive health recommendations were provided to patient.     Wynetta Fines, RN  06/13/2017  83 year old patient who was seen today for subsequent Medicare wellness visit.  Results reviewed and agree with findings.  Nyoka Cowden

## 2017-06-18 ENCOUNTER — Telehealth: Payer: Self-pay | Admitting: Family Medicine

## 2017-06-18 NOTE — Telephone Encounter (Signed)
If they are concerned about DVT, I feel she should be re-evaluated.

## 2017-06-18 NOTE — Telephone Encounter (Signed)
Patient is having aches and states  it is only relieved with tylenol. Does she need to be reevaluated. [facility] stated [facility] leg was warm and had some swelling. Possible a doppler is needed. Contact patient on Cell [phone]

## 2017-06-18 NOTE — Telephone Encounter (Signed)
[facility] at [place] stated looked at patients leg and would like for patient to receive a dopler test. patient requests a call back.

## 2017-06-18 NOTE — Telephone Encounter (Signed)
Patient notified and is scheduled for appt tomorrow 10/10 at 1:30 pm

## 2017-06-19 ENCOUNTER — Encounter: Payer: Self-pay | Admitting: Family Medicine

## 2017-06-19 ENCOUNTER — Ambulatory Visit (INDEPENDENT_AMBULATORY_CARE_PROVIDER_SITE_OTHER): Payer: Medicare Other | Admitting: Family Medicine

## 2017-06-19 VITALS — BP 110/64 | HR 103 | Temp 98.2°F | Resp 17 | Ht 63.0 in | Wt 194.0 lb

## 2017-06-19 DIAGNOSIS — M5416 Radiculopathy, lumbar region: Secondary | ICD-10-CM | POA: Diagnosis not present

## 2017-06-19 NOTE — Patient Instructions (Addendum)
Lumbosacral Radiculopathy Lumbosacral radiculopathy is a condition that involves the spinal nerves and nerve roots in the low back and bottom of the spine. The condition develops when these nerves and nerve roots move out of place or become inflamed and cause symptoms. What are the causes? This condition may be caused by:  Pressure from a disk that bulges out of place (herniated disk). A disk is a plate of cartilage that separates bones in the spine.  Disk degeneration.  A narrowing of the bones of the lower back (spinal stenosis).  A tumor.  An infection.  An injury that places sudden pressure on the disks that cushion the bones of your lower spine.  What increases the risk? This condition is more likely to develop in:  Males aged 30-50 years.  Females aged 50-60 years.  People who lift improperly.  People who are overweight or live a sedentary lifestyle.  People who smoke.  People who perform repetitive activities that strain the spine.  What are the signs or symptoms? Symptoms of this condition include:  Pain that goes down from the back into the legs (sciatica). This is the most common symptom. The pain may be worse with sitting, coughing, or sneezing.  Pain and numbness in the arms and legs.  Muscle weakness.  Tingling.  Loss of bladder control or bowel control.  How is this diagnosed? This condition is diagnosed with a physical exam and medical history. If the pain is lasting, you may have tests, such as:  MRI scan.  X-ray.  CT scan.  Myelogram.  Nerve conduction study.  How is this treated? This condition is often treated with:  Hot packs and ice applied to affected areas.  Stretches to improve flexibility.  Exercises to strengthen back muscles.  Physical therapy.  Pain medicine.  A steroid injection in the spine.  In some cases, no treatment is needed. If the condition is long-lasting (chronic), or if symptoms are severe, treatment may  involve surgery or lifestyle changes, such as following a weight loss plan. Follow these instructions at home: Medicines  Take medicines only as directed by your health care provider.  Do not drive or operate heavy machinery while taking pain medicine. Injury care  Apply a heat pack to the injured area as directed by your health care provider.  Apply ice to the affected area: ? Put ice in a plastic bag. ? Place a towel between your skin and the bag. ? Leave the ice on for 20-30 minutes, every 2 hours while you are awake or as needed. Or, leave the ice on for as long as directed by your health care provider. Other Instructions  If you were shown how to do any exercises or stretches, do them as directed by your health care provider.  If your health care provider prescribed a diet or exercise program, follow it as directed.  Keep all follow-up visits as directed by your health care provider. This is important. Contact a health care provider if:  Your pain does not improve over time even when taking pain medicines. Get help right away if:  Your develop severe pain.  Your pain suddenly gets worse.  You develop increasing weakness in your legs.  You lose the ability to control your bladder or bowel.  You have difficulty walking or balancing.  You have a fever. This information is not intended to replace advice given to you by your health care provider. Make sure you discuss any questions you have with your   health care provider. Document Released: 08/27/2005 Document Revised: 02/02/2016 Document Reviewed: 08/23/2014 Elsevier Interactive Patient Education  2018 [place] with Tylenol as needed for pain Follow up for any weakness or numbness or progressive pain.

## 2017-06-19 NOTE — Progress Notes (Signed)
Subjective:     Patient ID: Sara Logan, female   DOB: [birth year], 83 y.o.   MRN: [id number]  HPI Patient seen after call received yesterday. She had some physical therapy at [facility] who saw her and had some concern for possible DVT. Patient states that the physical therapist felt her thighs and felt the left was slightly "warmer" than the right.    Also, patient has been having pain diffusely in the left lower extremity though really more lateral pain. She describes achy pain which radiates from the buttock area all the way down to the foot at times. No weakness. No numbness. No urine or stool incontinence. No history of DVT.  Patient has not noted any color changes in her skin. Pain occurs at rest. No exacerbating features. Improved with Tylenol. No dyspnea. No pleuritic pain. No chest pain. No pain with ambulation  Past Medical History:  Diagnosis Date  . Arthritis   . Depression   . Hyperlipidemia   . Osteopenia   . Stroke ([place])   . Thrombocytopenia ([place])   . Vitamin D deficiency   . Wrist fracture 2002   left   Past Surgical History:  Procedure Laterality Date  . APPENDECTOMY  1967  . CARPAL TUNNEL RELEASE  2008   right  . JOINT REPLACEMENT  2002   right total     reports that she has never smoked. She has never used smokeless tobacco. She reports that she does not drink alcohol or use drugs. family history includes Breast cancer in her mother and sister. Allergies  Allergen Reactions  . Bactrim [Sulfamethoxazole-Trimethoprim] Nausea Only     Review of Systems  Respiratory: Negative for shortness of breath.   Cardiovascular: Negative for chest pain.       Objective:   Physical Exam  Constitutional: She appears well-developed and well-nourished.  Cardiovascular: Normal rate and regular rhythm.   Pulmonary/Chest: Effort normal and breath sounds normal. No respiratory distress. She has no wheezes. She has no rales.  Musculoskeletal: She exhibits no edema.   Patient does not have any visible change in size of her legs right versus left. No color changes. No increased warmth. She has some prominent varicose veins bilaterally. No calf tenderness. No thigh tenderness. Straight leg raise is negative.  Neurological:  Full strength lower extremities throughout. Sensory function normal throughout       Assessment:     Left lower extremity pain. Clinically, no evidence for likely DVT. She describes diffuse achiness and suspect more likely lumbar radiculitis    Plan:     -Continue Tylenol since this seems to be helping and observation -Follow-up promptly for any weakness or worsening pain -Follow-up promptly for any increased edema or other concerns  Eulas Post MD [facility] at [facility]

## 2017-06-26 ENCOUNTER — Ambulatory Visit: Payer: Medicare Other | Admitting: Family Medicine

## 2017-06-27 ENCOUNTER — Other Ambulatory Visit: Payer: Self-pay | Admitting: Family Medicine

## 2017-07-16 DIAGNOSIS — Q6689 Other  specified congenital deformities of feet: Secondary | ICD-10-CM | POA: Diagnosis not present

## 2017-07-16 DIAGNOSIS — L602 Onychogryphosis: Secondary | ICD-10-CM | POA: Diagnosis not present

## 2017-07-17 DIAGNOSIS — Z961 Presence of intraocular lens: Secondary | ICD-10-CM | POA: Diagnosis not present

## 2017-07-17 DIAGNOSIS — H35362 Drusen (degenerative) of macula, left eye: Secondary | ICD-10-CM | POA: Diagnosis not present

## 2017-07-17 DIAGNOSIS — H21232 Degeneration of iris (pigmentary), left eye: Secondary | ICD-10-CM | POA: Diagnosis not present

## 2017-08-22 ENCOUNTER — Other Ambulatory Visit: Payer: Self-pay | Admitting: Family Medicine

## 2017-09-30 ENCOUNTER — Telehealth: Payer: Self-pay | Admitting: *Deleted

## 2017-09-30 NOTE — Telephone Encounter (Signed)
Copied from [place] [id number]. Topic: Inquiry >> Sep 30, 2017 10:47 AM Sara Logan wrote: Reason for CRM: pt called to ask about the pneumonia and shingles vaccine, pt wants to know the recommendation on when to take them and if she can take them at the same time or come in opposite days to get them, contact pt to advise  Left message on machine for patient to return our call

## 2017-09-30 NOTE — Telephone Encounter (Signed)
She can get at same time- but, of course, we have been told that she would need to get Shingrix through pharmacy since she has Medicare.

## 2017-10-08 NOTE — Telephone Encounter (Signed)
Patient is aware and will call back to schedule an appointment for a pneumonia vaccine

## 2017-10-14 DIAGNOSIS — H35033 Hypertensive retinopathy, bilateral: Secondary | ICD-10-CM | POA: Insufficient documentation

## 2017-10-14 DIAGNOSIS — Z961 Presence of intraocular lens: Secondary | ICD-10-CM | POA: Diagnosis not present

## 2017-10-14 DIAGNOSIS — H52201 Unspecified astigmatism, right eye: Secondary | ICD-10-CM | POA: Diagnosis not present

## 2017-10-14 DIAGNOSIS — H524 Presbyopia: Secondary | ICD-10-CM | POA: Diagnosis not present

## 2017-10-14 DIAGNOSIS — H5211 Myopia, right eye: Secondary | ICD-10-CM | POA: Diagnosis not present

## 2017-10-14 DIAGNOSIS — H268 Other specified cataract: Secondary | ICD-10-CM | POA: Diagnosis not present

## 2017-10-15 ENCOUNTER — Ambulatory Visit (INDEPENDENT_AMBULATORY_CARE_PROVIDER_SITE_OTHER): Payer: Medicare Other | Admitting: *Deleted

## 2017-10-15 DIAGNOSIS — Z23 Encounter for immunization: Secondary | ICD-10-CM

## 2017-10-15 IMAGING — CR DG CHEST 2V
2 series · 2 of 2 positions shown · non-contrast
Comparison: 04/14/2014, 05/22/2012 and 01/26/2005.

CLINICAL DATA: 83-year-old with acute onset of shortness of breath,
dizziness and nausea.

EXAM:
CHEST  2 VIEW

[chest lat]
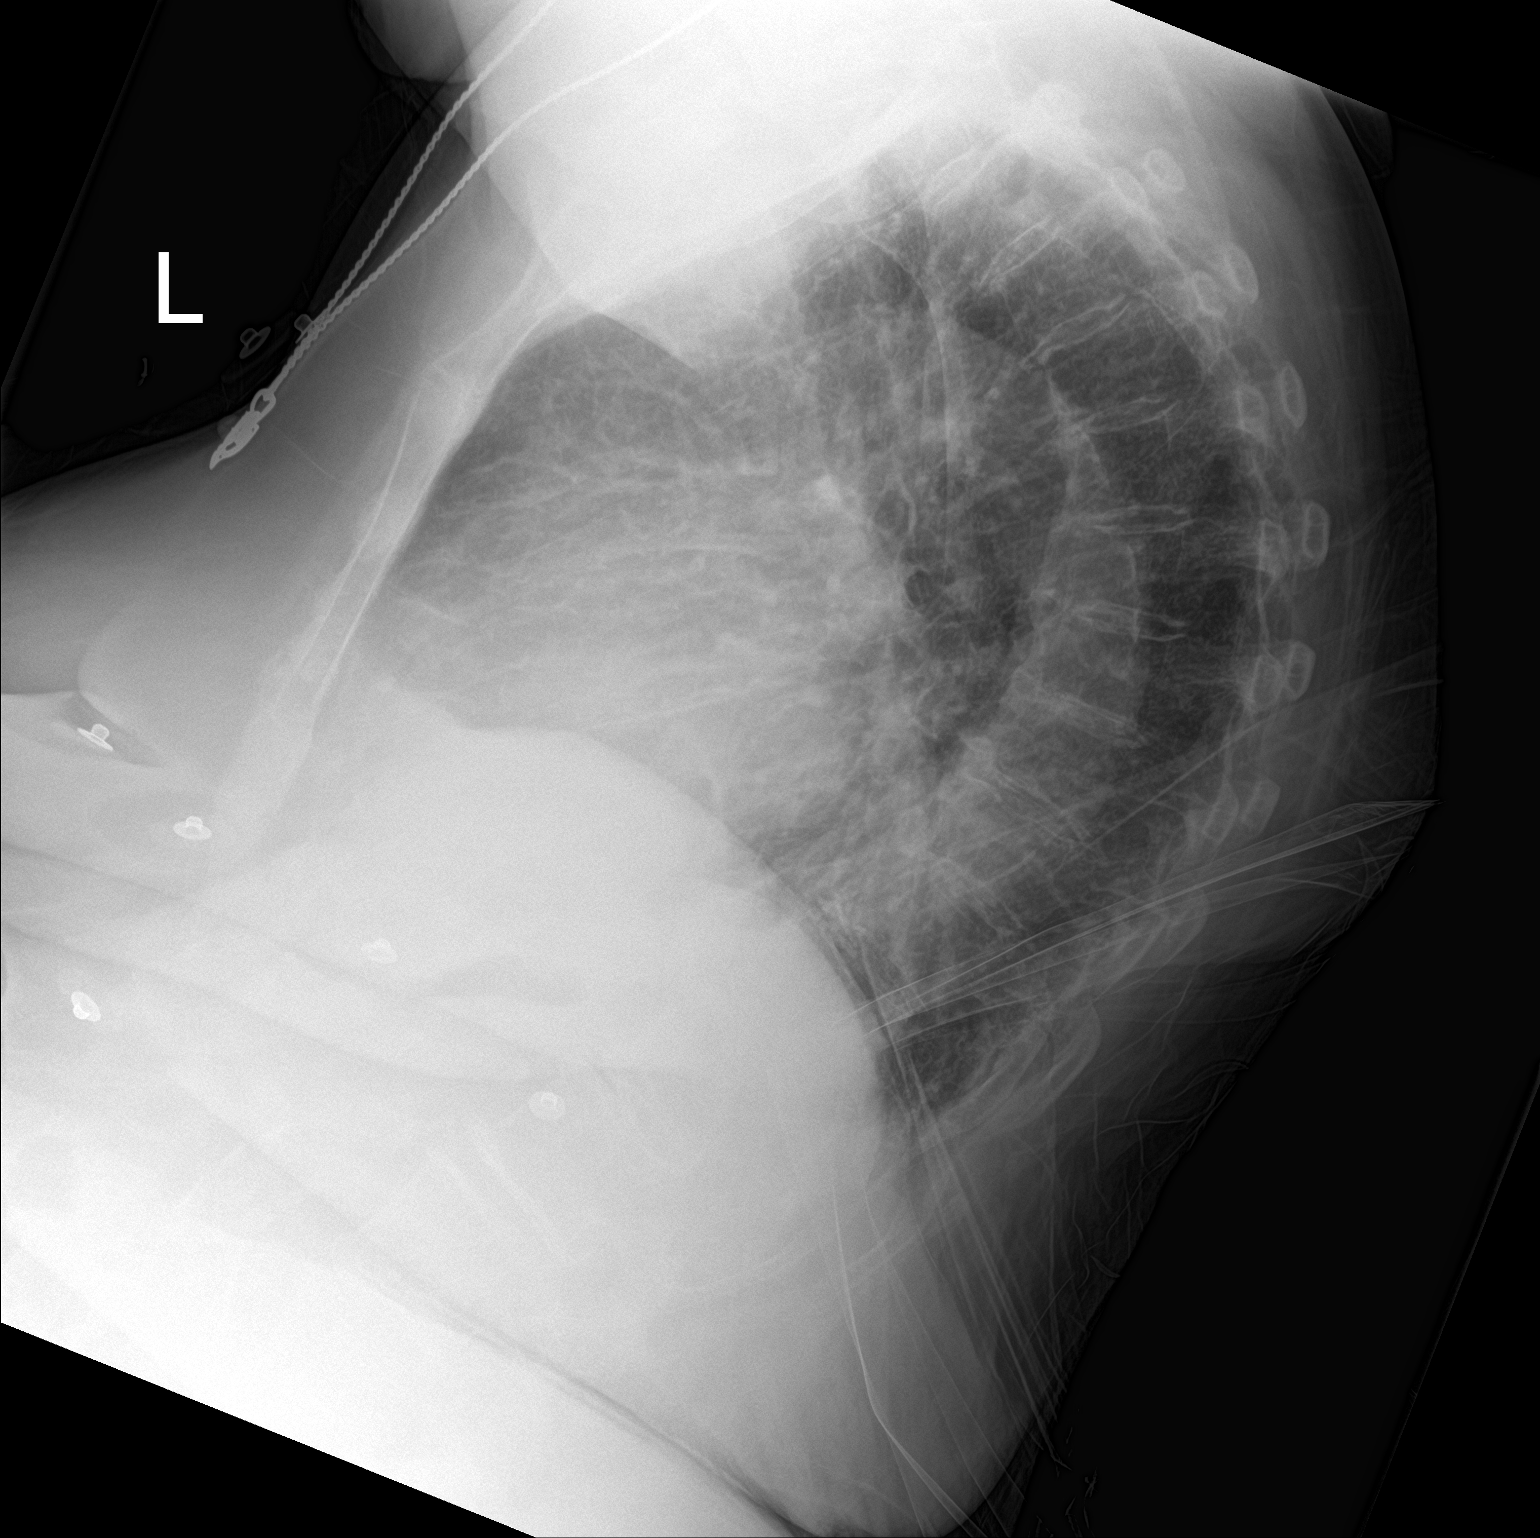

[chest ap]
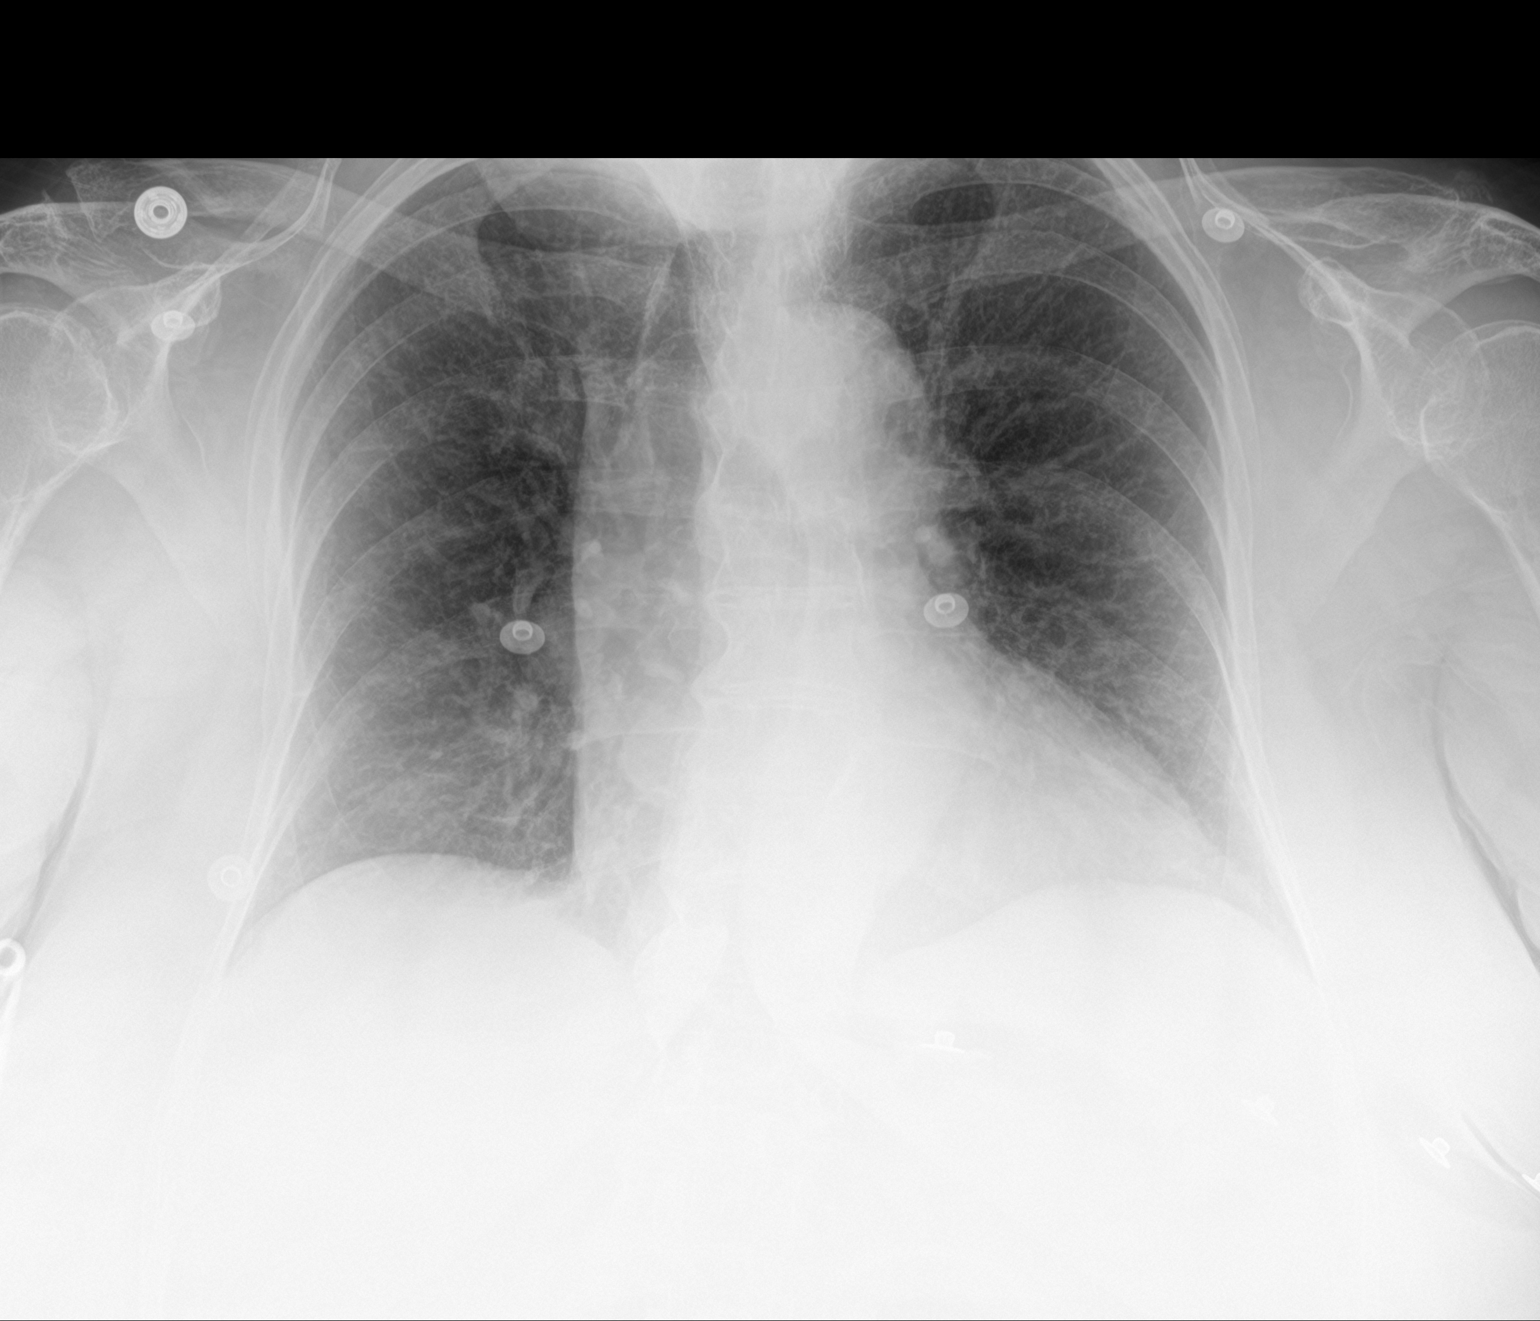

[2 of 2 positions shown; findings below may reference images not displayed]

FINDINGS: AP semi-erect and lateral images were obtained. Suboptimal
inspiration accounts for crowded bronchovascular markings,
especially in the bases, and accentuates the cardiac silhouette.
Taking this into account, cardiac silhouette upper normal in size
for the AP technique. Thoracic aorta tortuous and atherosclerotic,
unchanged. Hilar and mediastinal contours otherwise unremarkable.
Prominent bronchovascular markings diffusely and mild central
peribronchial thickening, more so than on the prior examination.
Lungs otherwise clear. No localized airspace consolidation. No
pleural effusions. No pneumothorax. Normal pulmonary vascularity.
Exaggeration of the usual thoracic kyphosis, osseous
demineralization and degenerative changes throughout the thoracic
spine.
IMPRESSION: 1. Suboptimal inspiration. Mild changes of acute bronchitis and/or
asthma without focal airspace pneumonia.
2. Thoracic aortic atherosclerosis.

## 2017-10-15 NOTE — Progress Notes (Signed)
Per orders of Dr. Elease Hashimoto, injection of Pneumovax 23 given by Dorrene German. Patient tolerated injection well.

## 2017-12-03 ENCOUNTER — Other Ambulatory Visit: Payer: Self-pay | Admitting: Family Medicine

## 2017-12-03 DIAGNOSIS — Z1231 Encounter for screening mammogram for malignant neoplasm of breast: Secondary | ICD-10-CM

## 2017-12-20 ENCOUNTER — Other Ambulatory Visit: Payer: Self-pay | Admitting: Family Medicine

## 2017-12-25 ENCOUNTER — Ambulatory Visit
Admission: RE | Admit: 2017-12-25 | Discharge: 2017-12-25 | Disposition: A | Discharge status: Home / Self Care | Payer: Medicare Other | Source: Ambulatory Visit | Attending: Family Medicine | Admitting: Family Medicine

## 2017-12-25 DIAGNOSIS — Z1231 Encounter for screening mammogram for malignant neoplasm of breast: Secondary | ICD-10-CM | POA: Diagnosis not present

## 2018-01-22 ENCOUNTER — Encounter: Payer: Self-pay | Admitting: Family Medicine

## 2018-01-22 ENCOUNTER — Ambulatory Visit (INDEPENDENT_AMBULATORY_CARE_PROVIDER_SITE_OTHER): Payer: Medicare Other | Admitting: Family Medicine

## 2018-01-22 VITALS — BP 110/70 | HR 96 | Temp 99.4°F | Wt 191.8 lb

## 2018-01-22 DIAGNOSIS — R300 Dysuria: Secondary | ICD-10-CM | POA: Diagnosis not present

## 2018-01-22 DIAGNOSIS — I10 Essential (primary) hypertension: Secondary | ICD-10-CM

## 2018-01-22 DIAGNOSIS — R3915 Urgency of urination: Secondary | ICD-10-CM | POA: Diagnosis not present

## 2018-01-22 DIAGNOSIS — Z9181 History of falling: Secondary | ICD-10-CM

## 2018-01-22 LAB — POCT URINALYSIS DIPSTICK
Bilirubin, UA: NEGATIVE
Glucose, UA: NEGATIVE
Ketones, UA: NEGATIVE
Nitrite, UA: NEGATIVE
Protein, UA: POSITIVE
Spec Grav, UA: 1.015 (ref 1.010–1.025)
Urobilinogen, UA: 0.2 E.U./dL
pH, UA: 6 (ref 5.0–8.0)

## 2018-01-22 MED ORDER — MIRABEGRON ER 25 MG PO TB24: 25.0000 mg | ORAL_TABLET | Freq: Every day | ORAL | 6 refills | Status: DC

## 2018-01-22 NOTE — Progress Notes (Signed)
  Subjective:     Patient ID: Sara Logan, female   DOB: 1932/12/21, 84 y.o.   MRN: [id number]  HPI Patient seen for the following issues  History of frequent UTI in the past.  Suspected atrophic vaginitis and she has taken Estrace cream in the past which seemed to help but she stopped taking this several months ago. She presents now with some intermittent burning with urination although she denies any burning now for several days. No flank pain. No vaginal discharge. No fevers or chills.  She has history of some chronic urinary urgency. We have prescribed Myrbetriq previously but she never got this filled. She usually gets up about 5 times at night to urinate. This has been more chronic.  Hypertension treated with amlodipine. No recent dizziness or headaches. No chest pains. She states she's felt somewhat more "off balance" recently. She has had discussions with physical therapist at her current residence and they're considering starting some physical therapy for balance and fall risk reduction  Past Medical History:  Diagnosis Date  . Arthritis   . Depression   . Hyperlipidemia   . Osteopenia   . Stroke ([place])   . Thrombocytopenia ([place])   . Vitamin D deficiency   . Wrist fracture 2002   left   Past Surgical History:  Procedure Laterality Date  . APPENDECTOMY  1967  . CARPAL TUNNEL RELEASE  2008   right  . JOINT REPLACEMENT  2002   right total     reports that she has never smoked. She has never used smokeless tobacco. She reports that she does not drink alcohol or use drugs. family history includes Breast cancer in her mother and sister. Allergies  Allergen Reactions  . Bactrim [Sulfamethoxazole-Trimethoprim] Nausea Only     Review of Systems  Constitutional: Negative for chills and fever.  Respiratory: Negative for shortness of breath.   Cardiovascular: Negative for chest pain.  Gastrointestinal: Negative for abdominal pain.  Genitourinary: Positive for dysuria,  frequency and urgency. Negative for flank pain and hematuria.  Musculoskeletal: Negative for back pain.  Neurological: Positive for weakness. Negative for dizziness and syncope.  Hematological: Negative for adenopathy.       Objective:   Physical Exam  Constitutional: She is oriented to person, place, and time. She appears well-developed and well-nourished.  Cardiovascular: Normal rate and regular rhythm.  Pulmonary/Chest: Effort normal and breath sounds normal. No respiratory distress. She has no wheezes. She has no rales.  Musculoskeletal: She exhibits no edema.  Neurological: She is alert and oriented to person, place, and time. No cranial nerve deficit.       Assessment:     #1 dysuria. Symptoms very intermittent over the past few weeks. Suspect may be related to atrophic vaginitis more than anything. Urine dip today reveals trace leukocytes but negative nitrites. She was unable to give [place] sufficient quantity to culture  #2 urinary urgency  #3 hypertension stable and at goal  #4 at risk for falls.    Plan:     -We attempted to obtain urine culture but patient was unable to go -Follow-up immediately for any fever, persistent dysuria, gross hematuria -Get back on more regular use of Estrace vaginal cream at least 3 times weekly -We recommended she go ahead and start some physical therapy at her residence to help with balance and fall risk reduction  Eulas Post MD [facility] at [facility]

## 2018-01-22 NOTE — Patient Instructions (Addendum)
Get back on the Estrace vaginal cream Follow up for any fever, persistent burning with urination, or any gross blood in urine Atrophic Vaginitis Atrophic vaginitis is a condition in which the tissues that line the vagina become dry and thin. This condition is most common in women who have stopped having regular menstrual periods (menopause). This usually starts when a woman is 59-82 years old. Estrogen helps to keep the vagina moist. It stimulates the vagina to produce a clear fluid that lubricates the vagina for sexual intercourse. This fluid also protects the vagina from infection. Lack of estrogen can cause the lining of the vagina to get thinner and dryer. The vagina may also shrink in size. It may become less elastic. Atrophic vaginitis tends to get worse over time as a woman's estrogen level drops. What are the causes? This condition is caused by the normal drop in estrogen that happens around the time of menopause. What increases the risk? Certain conditions or situations may lower a woman's estrogen level, which increases her risk of atrophic vaginitis. These include:  Taking medicine that blocks estrogen.  Having ovaries removed surgically.  Being treated for cancer with X-ray treatment (radiation) or medicines (chemotherapy).  Exercising very hard and often.  Having an eating disorder (anorexia).  Giving birth or breastfeeding.  Being over the age of 64.  Smoking.  What are the signs or symptoms? Symptoms of this condition include:  Pain, soreness, or bleeding during sexual intercourse (dyspareunia).  Vaginal burning, irritation, or itching.  Pain or bleeding during a vaginal examination using a speculum (pelvic exam).  Loss of interest in sexual activity.  Having burning pain when passing urine.  Vaginal discharge that is brown or yellow.  In some cases, there are no symptoms. How is this diagnosed? This condition is diagnosed with a medical history and physical  exam. This will include a pelvic exam that checks whether the inside of your vagina appears pale, thin, or dry. Rarely, you may also have other tests, including:  A urine test.  A test that checks the acid balance in your vaginal fluid (acid balance test).  How is this treated? Treatment for this condition may depend on the severity of your symptoms. Treatment may include:  Using an over-the-counter vaginal lubricant before you have sexual intercourse.  Using a long-acting vaginal moisturizer.  Using low-dose vaginal estrogen for moderate to severe symptoms that do not respond to other treatments. Options include creams, tablets, and inserts (vaginal rings). Before using vaginal estrogen, tell your health care provider if you have a history of: ? Breast cancer. ? Endometrial cancer. ? Blood clots.  Taking medicines. You may be able to take a daily pill for dyspareunia. Discuss all of the risks of this medicine with your health care provider. It is usually not recommended for women who have a family history or personal history of breast cancer.  If your symptoms are very mild and you are not sexually active, you may not need treatment. Follow these instructions at home:  Take medicines only as directed by your health care provider. Do not use herbal or alternative medicines unless your health care provider says that you can.  Use over-the-counter creams, lubricants, or moisturizers for dryness only as directed by your health care provider.  If your atrophic vaginitis is caused by menopause, discuss all of your menopausal symptoms and treatment options with your health care provider.  Do not douche.  Do not use products that can make your vagina dry. These  include: ? Scented feminine sprays. ? Scented tampons. ? Scented soaps.  If it hurts to have sex, talk with your sexual partner. Contact a health care provider if:  Your discharge looks different than normal.  Your vagina has  an unusual smell.  You have new symptoms.  Your symptoms do not improve with treatment.  Your symptoms get worse. This information is not intended to replace advice given to you by your health care provider. Make sure you discuss any questions you have with your health care provider. Document Released: 01/11/2015 Document Revised: 02/02/2016 Document Reviewed: 08/18/2014 Elsevier Interactive Patient Education  [employer].

## 2018-01-27 ENCOUNTER — Other Ambulatory Visit: Payer: Self-pay | Admitting: Family Medicine

## 2018-02-07 ENCOUNTER — Ambulatory Visit (INDEPENDENT_AMBULATORY_CARE_PROVIDER_SITE_OTHER): Payer: Medicare Other | Admitting: Family Medicine

## 2018-02-07 ENCOUNTER — Encounter: Payer: Self-pay | Admitting: Family Medicine

## 2018-02-07 VITALS — BP 110/64 | HR 93 | Temp 98.5°F | Ht 61.5 in | Wt 193.2 lb

## 2018-02-07 DIAGNOSIS — R61 Generalized hyperhidrosis: Secondary | ICD-10-CM | POA: Diagnosis not present

## 2018-02-07 DIAGNOSIS — M79644 Pain in right finger(s): Secondary | ICD-10-CM

## 2018-02-07 DIAGNOSIS — R531 Weakness: Secondary | ICD-10-CM

## 2018-02-07 NOTE — Progress Notes (Signed)
Subjective:     Patient ID: Sara Logan, female   DOB: Oct 25, 1932, 84 y.o.   MRN: [id number]  HPI Patient seen for several issues as follows  Right thumb pain. She was seen for similar issues several months ago and felt to have tendinitis. We injected her thumb and she felt much better afterwards but never fully resolved. She is right-hand dominant. She has thumb spica splint at home but has not been using this. She's not tried any icing.  Other issue is she has occasional night sweats. She's been on Effexor for depression for several years. Reluctant to reduce. She has not had any recent weight loss, appetite changes, cough, lymphadenopathy, or other concerning symptoms.  Patient had recent episode of transient weakness. She went to the beach about 8 days ago. When coming back on a train trip she developed some diarrhea and was delayed getting back. She feels like the combination of travel and diarrhea had weakened her. She also was not drinking enough fluids. After rehydrating she felt better. Diarrhea resolved at this time  Past Medical History:  Diagnosis Date  . Arthritis   . Depression   . Hyperlipidemia   . Osteopenia   . Stroke ([place])   . Thrombocytopenia ([place])   . Vitamin D deficiency   . Wrist fracture 2002   left   Past Surgical History:  Procedure Laterality Date  . APPENDECTOMY  1967  . CARPAL TUNNEL RELEASE  2008   right  . JOINT REPLACEMENT  2002   right total     reports that she has never smoked. She has never used smokeless tobacco. She reports that she does not drink alcohol or use drugs. family history includes Breast cancer in her mother and sister. Allergies  Allergen Reactions  . Bactrim [Sulfamethoxazole-Trimethoprim] Nausea Only     Review of Systems  Constitutional: Negative for chills and fever.  Eyes: Negative for visual disturbance.  Respiratory: Negative for cough, chest tightness, shortness of breath and wheezing.   Cardiovascular:  Negative for chest pain, palpitations and leg swelling.  Genitourinary: Negative for dysuria.  Neurological: Negative for dizziness, seizures, syncope, weakness, light-headedness and headaches.  Hematological: Negative for adenopathy.       Objective:   Physical Exam  Constitutional: She appears well-developed and well-nourished.  Neck: Neck supple.  Cardiovascular: Normal rate.  Pulmonary/Chest: Effort normal and breath sounds normal. She has no wheezes. She has no rales.  Musculoskeletal:  Patient has a small bruise over her right distal radius. No bony tenderness. She has some minimal tenderness over the abductor tendon of the thumb around the distal radius region. Full range of motion in the wrist. No edema. No warmth. No tenderness around the [facility] or and MCP joint of the thumb  Lymphadenopathy:    She has no cervical adenopathy.       Assessment:     #1 right thumb pain. Suspect de Quervain's tenosynovitis  #2 night sweats. Patient does not have any red flag symptoms such as appetite change, weight loss, fever, chills, lymphadenopathy, chronic cough, etc. Suspect likely related to her Effexor  #3 recent transient weakness probably related to travels and diarrhea.    Plan:     -Recommend icing 15-20 minutes 2-3 times daily for right wrist -Get back into thumb spica splint especially at night -Consider steroid injection if not improving over the next few weeks -Increase hydration -We discussed pros and cons of tapering back on Effexor and at this point she is  reluctant. Follow-up immediately for any fever, chills, appetite change, or other new symptoms  Eulas Post MD [facility]

## 2018-02-07 NOTE — Patient Instructions (Signed)
Get back to using right thumb spica splint at night  Try some icing to right wrist 15-20 minutes 2-3 times daily  If wrist no better in 2-3 weeks let me know and we can consider steroid injection.    Suspect your night sweats may be related to the Effexor.

## 2018-02-18 DIAGNOSIS — R0600 Dyspnea, unspecified: Secondary | ICD-10-CM | POA: Diagnosis not present

## 2018-02-18 DIAGNOSIS — R29898 Other symptoms and signs involving the musculoskeletal system: Secondary | ICD-10-CM | POA: Diagnosis not present

## 2018-02-18 DIAGNOSIS — M6281 Muscle weakness (generalized): Secondary | ICD-10-CM | POA: Diagnosis not present

## 2018-02-18 DIAGNOSIS — N3946 Mixed incontinence: Secondary | ICD-10-CM | POA: Diagnosis not present

## 2018-02-18 DIAGNOSIS — R2681 Unsteadiness on feet: Secondary | ICD-10-CM | POA: Diagnosis not present

## 2018-02-21 DIAGNOSIS — M6281 Muscle weakness (generalized): Secondary | ICD-10-CM | POA: Diagnosis not present

## 2018-02-21 DIAGNOSIS — R29898 Other symptoms and signs involving the musculoskeletal system: Secondary | ICD-10-CM | POA: Diagnosis not present

## 2018-02-21 DIAGNOSIS — N3946 Mixed incontinence: Secondary | ICD-10-CM | POA: Diagnosis not present

## 2018-02-21 DIAGNOSIS — R0600 Dyspnea, unspecified: Secondary | ICD-10-CM | POA: Diagnosis not present

## 2018-02-21 DIAGNOSIS — R2681 Unsteadiness on feet: Secondary | ICD-10-CM | POA: Diagnosis not present

## 2018-02-24 DIAGNOSIS — R29898 Other symptoms and signs involving the musculoskeletal system: Secondary | ICD-10-CM | POA: Diagnosis not present

## 2018-02-24 DIAGNOSIS — R0600 Dyspnea, unspecified: Secondary | ICD-10-CM | POA: Diagnosis not present

## 2018-02-24 DIAGNOSIS — N3946 Mixed incontinence: Secondary | ICD-10-CM | POA: Diagnosis not present

## 2018-02-24 DIAGNOSIS — R2681 Unsteadiness on feet: Secondary | ICD-10-CM | POA: Diagnosis not present

## 2018-02-24 DIAGNOSIS — M6281 Muscle weakness (generalized): Secondary | ICD-10-CM | POA: Diagnosis not present

## 2018-02-27 DIAGNOSIS — M6281 Muscle weakness (generalized): Secondary | ICD-10-CM | POA: Diagnosis not present

## 2018-02-27 DIAGNOSIS — N3946 Mixed incontinence: Secondary | ICD-10-CM | POA: Diagnosis not present

## 2018-02-27 DIAGNOSIS — R0600 Dyspnea, unspecified: Secondary | ICD-10-CM | POA: Diagnosis not present

## 2018-02-27 DIAGNOSIS — R2681 Unsteadiness on feet: Secondary | ICD-10-CM | POA: Diagnosis not present

## 2018-02-27 DIAGNOSIS — R29898 Other symptoms and signs involving the musculoskeletal system: Secondary | ICD-10-CM | POA: Diagnosis not present

## 2018-03-01 ENCOUNTER — Other Ambulatory Visit: Payer: Self-pay | Admitting: Family Medicine

## 2018-03-04 DIAGNOSIS — R29898 Other symptoms and signs involving the musculoskeletal system: Secondary | ICD-10-CM | POA: Diagnosis not present

## 2018-03-04 DIAGNOSIS — R0600 Dyspnea, unspecified: Secondary | ICD-10-CM | POA: Diagnosis not present

## 2018-03-04 DIAGNOSIS — R2681 Unsteadiness on feet: Secondary | ICD-10-CM | POA: Diagnosis not present

## 2018-03-04 DIAGNOSIS — N3946 Mixed incontinence: Secondary | ICD-10-CM | POA: Diagnosis not present

## 2018-03-04 DIAGNOSIS — M6281 Muscle weakness (generalized): Secondary | ICD-10-CM | POA: Diagnosis not present

## 2018-03-11 DIAGNOSIS — R29898 Other symptoms and signs involving the musculoskeletal system: Secondary | ICD-10-CM | POA: Diagnosis not present

## 2018-03-11 DIAGNOSIS — N3946 Mixed incontinence: Secondary | ICD-10-CM | POA: Diagnosis not present

## 2018-03-11 DIAGNOSIS — M6281 Muscle weakness (generalized): Secondary | ICD-10-CM | POA: Diagnosis not present

## 2018-03-11 DIAGNOSIS — R2681 Unsteadiness on feet: Secondary | ICD-10-CM | POA: Diagnosis not present

## 2018-03-11 DIAGNOSIS — R29818 Other symptoms and signs involving the nervous system: Secondary | ICD-10-CM | POA: Diagnosis not present

## 2018-03-11 DIAGNOSIS — R32 Unspecified urinary incontinence: Secondary | ICD-10-CM | POA: Diagnosis not present

## 2018-03-11 DIAGNOSIS — R0600 Dyspnea, unspecified: Secondary | ICD-10-CM | POA: Diagnosis not present

## 2018-03-14 DIAGNOSIS — R29818 Other symptoms and signs involving the nervous system: Secondary | ICD-10-CM | POA: Diagnosis not present

## 2018-03-14 DIAGNOSIS — R32 Unspecified urinary incontinence: Secondary | ICD-10-CM | POA: Diagnosis not present

## 2018-03-14 DIAGNOSIS — R29898 Other symptoms and signs involving the musculoskeletal system: Secondary | ICD-10-CM | POA: Diagnosis not present

## 2018-03-14 DIAGNOSIS — R2681 Unsteadiness on feet: Secondary | ICD-10-CM | POA: Diagnosis not present

## 2018-03-14 DIAGNOSIS — R0600 Dyspnea, unspecified: Secondary | ICD-10-CM | POA: Diagnosis not present

## 2018-03-14 DIAGNOSIS — M6281 Muscle weakness (generalized): Secondary | ICD-10-CM | POA: Diagnosis not present

## 2018-03-18 DIAGNOSIS — R32 Unspecified urinary incontinence: Secondary | ICD-10-CM | POA: Diagnosis not present

## 2018-03-18 DIAGNOSIS — M6281 Muscle weakness (generalized): Secondary | ICD-10-CM | POA: Diagnosis not present

## 2018-03-18 DIAGNOSIS — R29898 Other symptoms and signs involving the musculoskeletal system: Secondary | ICD-10-CM | POA: Diagnosis not present

## 2018-03-18 DIAGNOSIS — R29818 Other symptoms and signs involving the nervous system: Secondary | ICD-10-CM | POA: Diagnosis not present

## 2018-03-18 DIAGNOSIS — R0600 Dyspnea, unspecified: Secondary | ICD-10-CM | POA: Diagnosis not present

## 2018-03-18 DIAGNOSIS — R2681 Unsteadiness on feet: Secondary | ICD-10-CM | POA: Diagnosis not present

## 2018-03-21 DIAGNOSIS — R0600 Dyspnea, unspecified: Secondary | ICD-10-CM | POA: Diagnosis not present

## 2018-03-21 DIAGNOSIS — M6281 Muscle weakness (generalized): Secondary | ICD-10-CM | POA: Diagnosis not present

## 2018-03-21 DIAGNOSIS — R32 Unspecified urinary incontinence: Secondary | ICD-10-CM | POA: Diagnosis not present

## 2018-03-21 DIAGNOSIS — R29818 Other symptoms and signs involving the nervous system: Secondary | ICD-10-CM | POA: Diagnosis not present

## 2018-03-21 DIAGNOSIS — R29898 Other symptoms and signs involving the musculoskeletal system: Secondary | ICD-10-CM | POA: Diagnosis not present

## 2018-03-21 DIAGNOSIS — R2681 Unsteadiness on feet: Secondary | ICD-10-CM | POA: Diagnosis not present

## 2018-03-24 ENCOUNTER — Ambulatory Visit (INDEPENDENT_AMBULATORY_CARE_PROVIDER_SITE_OTHER): Payer: Medicare Other | Admitting: Family Medicine

## 2018-03-24 ENCOUNTER — Encounter: Payer: Self-pay | Admitting: Family Medicine

## 2018-03-24 VITALS — BP 118/80 | HR 91 | Temp 98.4°F | Resp 16 | Ht 61.5 in | Wt 191.5 lb

## 2018-03-24 DIAGNOSIS — N390 Urinary tract infection, site not specified: Secondary | ICD-10-CM

## 2018-03-24 DIAGNOSIS — N3941 Urge incontinence: Secondary | ICD-10-CM

## 2018-03-24 DIAGNOSIS — N952 Postmenopausal atrophic vaginitis: Secondary | ICD-10-CM

## 2018-03-24 DIAGNOSIS — R300 Dysuria: Secondary | ICD-10-CM | POA: Diagnosis not present

## 2018-03-24 DIAGNOSIS — R32 Unspecified urinary incontinence: Secondary | ICD-10-CM | POA: Insufficient documentation

## 2018-03-24 LAB — POCT URINALYSIS DIPSTICK
Bilirubin, UA: NEGATIVE
Blood, UA: POSITIVE
Glucose, UA: NEGATIVE
Ketones, UA: NEGATIVE
Nitrite, UA: NEGATIVE
Protein, UA: POSITIVE — AB
Spec Grav, UA: >=1.03 — AB (ref 1.010–1.025)
Urobilinogen, UA: 1 E.U./dL
pH, UA: 6 (ref 5.0–8.0)

## 2018-03-24 MED ORDER — CIPROFLOXACIN HCL 250 MG PO TABS: 250.0000 mg | ORAL_TABLET | Freq: Two times a day (BID) | ORAL | 0 refills | Status: AC

## 2018-03-24 MED ORDER — OXYBUTYNIN CHLORIDE ER 5 MG PO TB24: 5.0000 mg | ORAL_TABLET | Freq: Every day | ORAL | 1 refills | Status: DC

## 2018-03-24 NOTE — Patient Instructions (Signed)
A few things to remember from today's visit:   Dysuria - Plan: POCT urinalysis dipstick, Urine culture  Urinary tract infection without hematuria, site unspecified - Plan: ciprofloxacin (CIPRO) 250 MG tablet, Urine culture  Atrophic vaginitis  Urge incontinence of urine - Plan: oxybutynin (DITROPAN-XL) 5 MG 24 hr tablet    Adequate fluid intake, avoid holding urine for long hours, and over the counter Vit C OR cranberry capsules might help.  Today we will treat empirically with antibiotic, which we might need to change when urine culture comes back depending of bacteria susceptibility.  Seek immediate medical attention if severe abdominal pain, vomiting, fever/chills, or worsening symptoms. F/U if symptomatic are not any better after 2-3 days of antibiotic treatment.  Please be sure medication list is accurate. If a new problem present, please set up appointment sooner than planned today.

## 2018-03-24 NOTE — Progress Notes (Signed)
HPI:  Chief Complaint  Patient presents with  . Dysuria    off and on for a few weeks    Sara Logan is a 84 y.o. female, who is here today complaining of 2-3 weeks of intermittent dysuria.  Dysuria: Present. Urinary frequency: Yes,it is not more than usual. Nocturia interfering with her sleep, she feels fatigued. Urinary urgency: Not more than usual. Incontinence: Chronic, urgency with frequent urine leakage. Gross hematuria: Denies.  Abdominal pain:Negative.  Nausea or vomiting: Negative.  Abnormal vaginal bleeding or discharge: Negative.  LMP: Post menopausal. Sexual activity: No Hx of UTI, she has not had one since 05/2017. Ucx 05/2017 3 or more organisms present, normal flora contamination.  She has not noted fever or chills but she does "not feel good."  Denies fever, chills, changes in appetite, or recent falls. Occasionally she has lightheadedness.  Lower back pain, no radiated, achy-like and mild. She denies history of back pain. She has not identified exacerbating or alleviating factors.  OTC medications for this problem: Tylenol.  History of overactive bladder, urgency. She cannot rest at night because of nocturia. She was prescribed Myrbetriq but it is expensive, cannot afford it. According to patient, she has not tried other medications. She denies history of glaucoma.  History of atrophic vaginitis, she has been using topical estradiol as needed due to cost.   Review of Systems  Constitutional: Positive for fatigue. Negative for activity change, appetite change and fever.  Respiratory: Negative for shortness of breath and wheezing.   Cardiovascular: Negative for leg swelling.  Gastrointestinal: Negative for abdominal pain, constipation, diarrhea, nausea and vomiting.  Endocrine: Negative for polydipsia, polyphagia and polyuria.  Genitourinary: Positive for dysuria, frequency and urgency. Negative for decreased urine volume, hematuria,  vaginal bleeding and vaginal discharge.  Musculoskeletal: Positive for back pain. Negative for gait problem and myalgias.  Neurological: Positive for light-headedness. Negative for syncope, weakness and headaches.  Psychiatric/Behavioral: Positive for sleep disturbance. Negative for confusion. The patient is nervous/anxious.       Current Outpatient Medications on File Prior to Visit  Medication Sig Dispense Refill  . amLODipine (NORVASC) 5 MG tablet TAKE 1 TABLET ONCE DAILY. 90 tablet 0  . aspirin EC 325 MG EC tablet Take 1 tablet (325 mg total) by mouth daily. 30 tablet 0  . Biotin 10 MG TABS Take 10 mg by mouth daily.     . Cholecalciferol (VITAMIN D3) 2000 units capsule 1 tablet daily.    Marland Kitchen estradiol (ESTRACE VAGINAL) 0.1 MG/GM vaginal cream Place 1 Applicatorful vaginally 3 (three) times a week. (Patient taking differently: Place 1 Applicatorful vaginally as needed. ) 42.5 g 12  . mirabegron ER (MYRBETRIQ) 25 MG TB24 tablet Take 1 tablet (25 mg total) by mouth daily. 30 tablet 6  . Misc Natural Products (APPLE CIDER VINEGAR DIET PO) Take 200 mg by mouth.    Marland Kitchen Specialty Vitamins Products (MAGNESIUM, AMINO ACID CHELATE,) 133 MG tablet Take 1 tablet by mouth daily.    . TURMERIC PO Take by mouth daily.    Marland Kitchen venlafaxine XR (EFFEXOR-XR) 75 MG 24 hr capsule TAKE 2 CAPSULES (150MG ) BY MOUTH DAILY. 180 capsule 0  . vitamin B-12 (CYANOCOBALAMIN) 1000 MCG tablet daily.    . vitamin C (ASCORBIC ACID) 500 MG tablet Take 500 mg by mouth daily.    . vitamin E 100 UNIT capsule Take 100 Units by mouth daily.    . Zinc Sulfate (ZINC 15 PO) Take by mouth.  No current facility-administered medications on file prior to visit.      Past Medical History:  Diagnosis Date  . Arthritis   . Depression   . Hyperlipidemia   . Osteopenia   . Stroke ([place])   . Thrombocytopenia ([place])   . Vitamin D deficiency   . Wrist fracture 2002   left   Allergies  Allergen Reactions  . Bactrim  [Sulfamethoxazole-Trimethoprim] Nausea Only    Social History   Socioeconomic History  . Marital status: Married    Spouse name: Not on file  . Number of children: Not on file  . Years of education: Not on file  . Highest education level: Not on file  Occupational History  . Not on file  Social Needs  . Financial resource strain: Not on file  . Food insecurity:    Worry: Not on file    Inability: Not on file  . Transportation needs:    Medical: Not on file    Non-medical: Not on file  Tobacco Use  . Smoking status: Never Smoker  . Smokeless tobacco: Never Used  Substance and Sexual Activity  . Alcohol use: No  . Drug use: No  . Sexual activity: Not on file  Lifestyle  . Physical activity:    Days per week: Not on file    Minutes per session: Not on file  . Stress: Not on file  Relationships  . Social connections:    Talks on phone: Not on file    Gets together: Not on file    Attends religious service: Not on file    Active member of club or organization: Not on file    Attends meetings of clubs or organizations: Not on file    Relationship status: Not on file  Other Topics Concern  . Not on file  Social History Narrative  . Not on file    Vitals:   03/24/18 1104  BP: 118/80  Pulse: 91  Resp: 16  Temp: 98.4 F (36.9 C)  SpO2: 96%   Body mass index is 35.6 kg/m.   Physical Exam  Nursing note and vitals reviewed. Constitutional: She is oriented to person, place, and time. She appears well-developed. No distress.  HENT:  Head: Normocephalic and atraumatic.  Mouth/Throat: Oropharynx is clear and moist. Mucous membranes are dry.  Eyes: Pupils are equal, round, and reactive to light. Conjunctivae are normal.  Cardiovascular: Normal rate and regular rhythm.  No murmur heard. Respiratory: Effort normal and breath sounds normal. No respiratory distress.  GI: Soft. She exhibits no mass. There is no hepatomegaly. There is tenderness ("discomfort") in the  suprapubic area. There is no rigidity, no rebound, no guarding and no CVA tenderness.  Musculoskeletal: She exhibits edema (Trace pitting edema LE bilateral). She exhibits no tenderness.       Lumbar back: She exhibits no tenderness and no bony tenderness.  Lymphadenopathy:    She has no cervical adenopathy.  Neurological: She is alert and oriented to person, place, and time. She has normal strength.  SLR negative, bilateral.  Skin: Skin is warm. No rash noted. No erythema.  Psychiatric: Her mood appears anxious.  Well-groomed, good eye contact.     ASSESSMENT AND PLAN:   Ms. William was seen today for dysuria.  Diagnoses and all orders for this visit:  Dysuria  Urine dipstick abnormal. Urine sent for culture.  -     POCT urinalysis dipstick -     Urine culture  Urinary tract infection  without hematuria, site unspecified  Empiric abx treatment started today and will be tailored according to Ucx results and susceptibility report. We discussed some side effects of Cipro, including arrhythmias, GI symptoms, C. difficile, peripheral neuropathy, and Achilles tendon rupture among some. . Clearly instructed about warning signs. F/U if symptoms persist.  -     ciprofloxacin (CIPRO) 250 MG tablet; Take 1 tablet (250 mg total) by mouth 2 (two) times daily for 3 days. -     Urine culture  Atrophic vaginitis  Recommend using vaginal estradiol twice per week consistently.  Urge incontinence of urine  We discussed a few other pharmacologic treatment options. She agrees with trying oxybutynin, we will start with a low dose. We discussed some side effects. Follow-up with PCP in 2 months, before if needed.  -     oxybutynin (DITROPAN-XL) 5 MG 24 hr tablet; Take 1 tablet (5 mg total) by mouth at bedtime.     -SaraASHARIA LOTTER was advised to return or notify a doctor immediately if symptoms worsen.      Tarence Searcy G. Martinique, MD  [facility] [place]  office.

## 2018-04-01 DIAGNOSIS — M6281 Muscle weakness (generalized): Secondary | ICD-10-CM | POA: Diagnosis not present

## 2018-04-01 DIAGNOSIS — R0600 Dyspnea, unspecified: Secondary | ICD-10-CM | POA: Diagnosis not present

## 2018-04-01 DIAGNOSIS — R29818 Other symptoms and signs involving the nervous system: Secondary | ICD-10-CM | POA: Diagnosis not present

## 2018-04-01 DIAGNOSIS — R32 Unspecified urinary incontinence: Secondary | ICD-10-CM | POA: Diagnosis not present

## 2018-04-01 DIAGNOSIS — R2681 Unsteadiness on feet: Secondary | ICD-10-CM | POA: Diagnosis not present

## 2018-04-01 DIAGNOSIS — R29898 Other symptoms and signs involving the musculoskeletal system: Secondary | ICD-10-CM | POA: Diagnosis not present

## 2018-04-04 DIAGNOSIS — R2681 Unsteadiness on feet: Secondary | ICD-10-CM | POA: Diagnosis not present

## 2018-04-04 DIAGNOSIS — M6281 Muscle weakness (generalized): Secondary | ICD-10-CM | POA: Diagnosis not present

## 2018-04-04 DIAGNOSIS — R0600 Dyspnea, unspecified: Secondary | ICD-10-CM | POA: Diagnosis not present

## 2018-04-04 DIAGNOSIS — R29818 Other symptoms and signs involving the nervous system: Secondary | ICD-10-CM | POA: Diagnosis not present

## 2018-04-04 DIAGNOSIS — R32 Unspecified urinary incontinence: Secondary | ICD-10-CM | POA: Diagnosis not present

## 2018-04-04 DIAGNOSIS — R29898 Other symptoms and signs involving the musculoskeletal system: Secondary | ICD-10-CM | POA: Diagnosis not present

## 2018-04-11 DIAGNOSIS — M6281 Muscle weakness (generalized): Secondary | ICD-10-CM | POA: Diagnosis not present

## 2018-04-11 DIAGNOSIS — N3946 Mixed incontinence: Secondary | ICD-10-CM | POA: Diagnosis not present

## 2018-04-11 DIAGNOSIS — R0600 Dyspnea, unspecified: Secondary | ICD-10-CM | POA: Diagnosis not present

## 2018-04-11 DIAGNOSIS — R2681 Unsteadiness on feet: Secondary | ICD-10-CM | POA: Diagnosis not present

## 2018-04-11 DIAGNOSIS — R29898 Other symptoms and signs involving the musculoskeletal system: Secondary | ICD-10-CM | POA: Diagnosis not present

## 2018-04-15 DIAGNOSIS — M6281 Muscle weakness (generalized): Secondary | ICD-10-CM | POA: Diagnosis not present

## 2018-04-15 DIAGNOSIS — R2681 Unsteadiness on feet: Secondary | ICD-10-CM | POA: Diagnosis not present

## 2018-04-15 DIAGNOSIS — N3946 Mixed incontinence: Secondary | ICD-10-CM | POA: Diagnosis not present

## 2018-04-15 DIAGNOSIS — R29898 Other symptoms and signs involving the musculoskeletal system: Secondary | ICD-10-CM | POA: Diagnosis not present

## 2018-04-15 DIAGNOSIS — R0600 Dyspnea, unspecified: Secondary | ICD-10-CM | POA: Diagnosis not present

## 2018-04-18 DIAGNOSIS — N3946 Mixed incontinence: Secondary | ICD-10-CM | POA: Diagnosis not present

## 2018-04-18 DIAGNOSIS — M6281 Muscle weakness (generalized): Secondary | ICD-10-CM | POA: Diagnosis not present

## 2018-04-18 DIAGNOSIS — R2681 Unsteadiness on feet: Secondary | ICD-10-CM | POA: Diagnosis not present

## 2018-04-18 DIAGNOSIS — R0600 Dyspnea, unspecified: Secondary | ICD-10-CM | POA: Diagnosis not present

## 2018-04-18 DIAGNOSIS — R29898 Other symptoms and signs involving the musculoskeletal system: Secondary | ICD-10-CM | POA: Diagnosis not present

## 2018-04-22 DIAGNOSIS — R29898 Other symptoms and signs involving the musculoskeletal system: Secondary | ICD-10-CM | POA: Diagnosis not present

## 2018-04-22 DIAGNOSIS — R0600 Dyspnea, unspecified: Secondary | ICD-10-CM | POA: Diagnosis not present

## 2018-04-22 DIAGNOSIS — N3946 Mixed incontinence: Secondary | ICD-10-CM | POA: Diagnosis not present

## 2018-04-22 DIAGNOSIS — R2681 Unsteadiness on feet: Secondary | ICD-10-CM | POA: Diagnosis not present

## 2018-04-22 DIAGNOSIS — M6281 Muscle weakness (generalized): Secondary | ICD-10-CM | POA: Diagnosis not present

## 2018-04-25 DIAGNOSIS — M6281 Muscle weakness (generalized): Secondary | ICD-10-CM | POA: Diagnosis not present

## 2018-04-25 DIAGNOSIS — N3946 Mixed incontinence: Secondary | ICD-10-CM | POA: Diagnosis not present

## 2018-04-25 DIAGNOSIS — R29898 Other symptoms and signs involving the musculoskeletal system: Secondary | ICD-10-CM | POA: Diagnosis not present

## 2018-04-25 DIAGNOSIS — R0600 Dyspnea, unspecified: Secondary | ICD-10-CM | POA: Diagnosis not present

## 2018-04-25 DIAGNOSIS — R2681 Unsteadiness on feet: Secondary | ICD-10-CM | POA: Diagnosis not present

## 2018-04-26 ENCOUNTER — Other Ambulatory Visit: Payer: Self-pay | Admitting: Family Medicine

## 2018-04-29 DIAGNOSIS — N3946 Mixed incontinence: Secondary | ICD-10-CM | POA: Diagnosis not present

## 2018-04-29 DIAGNOSIS — M6281 Muscle weakness (generalized): Secondary | ICD-10-CM | POA: Diagnosis not present

## 2018-04-29 DIAGNOSIS — R2681 Unsteadiness on feet: Secondary | ICD-10-CM | POA: Diagnosis not present

## 2018-04-29 DIAGNOSIS — R0600 Dyspnea, unspecified: Secondary | ICD-10-CM | POA: Diagnosis not present

## 2018-04-29 DIAGNOSIS — R29898 Other symptoms and signs involving the musculoskeletal system: Secondary | ICD-10-CM | POA: Diagnosis not present

## 2018-05-01 DIAGNOSIS — M6281 Muscle weakness (generalized): Secondary | ICD-10-CM | POA: Diagnosis not present

## 2018-05-01 DIAGNOSIS — R29898 Other symptoms and signs involving the musculoskeletal system: Secondary | ICD-10-CM | POA: Diagnosis not present

## 2018-05-01 DIAGNOSIS — R0600 Dyspnea, unspecified: Secondary | ICD-10-CM | POA: Diagnosis not present

## 2018-05-01 DIAGNOSIS — N3946 Mixed incontinence: Secondary | ICD-10-CM | POA: Diagnosis not present

## 2018-05-01 DIAGNOSIS — R2681 Unsteadiness on feet: Secondary | ICD-10-CM | POA: Diagnosis not present

## 2018-05-05 DIAGNOSIS — R0600 Dyspnea, unspecified: Secondary | ICD-10-CM | POA: Diagnosis not present

## 2018-05-05 DIAGNOSIS — R2681 Unsteadiness on feet: Secondary | ICD-10-CM | POA: Diagnosis not present

## 2018-05-05 DIAGNOSIS — M6281 Muscle weakness (generalized): Secondary | ICD-10-CM | POA: Diagnosis not present

## 2018-05-05 DIAGNOSIS — N3946 Mixed incontinence: Secondary | ICD-10-CM | POA: Diagnosis not present

## 2018-05-05 DIAGNOSIS — R29898 Other symptoms and signs involving the musculoskeletal system: Secondary | ICD-10-CM | POA: Diagnosis not present

## 2018-06-02 ENCOUNTER — Other Ambulatory Visit: Payer: Self-pay | Admitting: Family Medicine

## 2018-06-16 NOTE — Progress Notes (Addendum)
Subjective:   Sara Logan is a 84 y.o. female who presents for Medicare Annual (Subsequent) preventive examination.  Reports health as good   [facility] and she likes it there dtr is on Jerelyn Scott is [place]  Will be visiting sister in [place] soon   [place]; now eating cheerios' milk, yogurt 1/2 banana Lunch; varies if she eats lunch or dinner at assisted living Noodles and oranges today  Did have jello; chicken; potato and green beans  Supper will eat dinner there tonight    BMI 34   Exercise No exercise;Exercise offered there; some OA in right side  Can walk the entire community without having to go outside Recommended exercise classes or any exercise of any type consistently  Advised her to see the activities director at the facility    Health Maintenance Due  Topic Date Due  . TETANUS/TDAP  09/11/2015  . INFLUENZA VACCINE  04/10/2018   Will take tetanus when she needs it at the pharmacy Educated regarding the shingrix  Took her high dose flu vaccine today   Colonoscopy 2008 Mammogram 12/2017 dexa 09/2012  -1.9 - no falls     Cardiac Risk Factors include: advanced age (>1men, >10 women);family history of premature cardiovascular disease;obesity (BMI >30kg/m2)        Objective:     Vitals: Wt 188 lb (85.3 kg)   BMI 34.95 kg/m   Body mass index is 34.95 kg/m.  Advanced Directives 06/17/2018 06/13/2017 06/07/2016 06/06/2016 09/25/2015 09/25/2015  Does Patient Have a Medical Advance Directive? Yes Yes No No Yes Yes  Copy of Healthcare Power of Attorney in Chart? - - - - Yes No - copy requested  Would patient like information on creating a medical advance directive? - - - No - patient declined information - -    Tobacco Social History   Tobacco Use  Smoking Status Never Smoker  Smokeless Tobacco Never Used     Counseling given: Not Answered   Clinical Intake:     Past Medical History:  Diagnosis Date  . Arthritis   . Depression     . Hyperlipidemia   . Osteopenia   . Stroke ([place])   . Thrombocytopenia ([place])   . Vitamin D deficiency   . Wrist fracture 2002   left   Past Surgical History:  Procedure Laterality Date  . APPENDECTOMY  1967  . CARPAL TUNNEL RELEASE  2008   right  . JOINT REPLACEMENT  2002   right total    Family History  Problem Relation Age of Onset  . Breast cancer Mother   . Breast cancer Sister    Social History   Socioeconomic History  . Marital status: Married    Spouse name: Not on file  . Number of children: Not on file  . Years of education: Not on file  . Highest education level: Not on file  Occupational History  . Not on file  Social Needs  . Financial resource strain: Not on file  . Food insecurity:    Worry: Not on file    Inability: Not on file  . Transportation needs:    Medical: Not on file    Non-medical: Not on file  Tobacco Use  . Smoking status: Never Smoker  . Smokeless tobacco: Never Used  Substance and Sexual Activity  . Alcohol use: No  . Drug use: No  . Sexual activity: Not on file  Lifestyle  . Physical activity:    Days per week:  Not on file    Minutes per session: Not on file  . Stress: Not on file  Relationships  . Social connections:    Talks on phone: Not on file    Gets together: Not on file    Attends religious service: Not on file    Active member of club or organization: Not on file    Attends meetings of clubs or organizations: Not on file    Relationship status: Not on file  Other Topics Concern  . Not on file  Social History Narrative  . Not on file    Outpatient Encounter Medications as of 06/17/2018  Medication Sig  . amLODipine (NORVASC) 5 MG tablet TAKE 1 TABLET ONCE DAILY.  Marland Kitchen aspirin EC 325 MG EC tablet Take 1 tablet (325 mg total) by mouth daily.  . Biotin 10 MG TABS Take 10 mg by mouth daily.   . Cholecalciferol (VITAMIN D3) 2000 units capsule 1 tablet daily.  Marland Kitchen estradiol (ESTRACE VAGINAL) 0.1 MG/GM vaginal cream  Place 1 Applicatorful vaginally 3 (three) times a week. (Patient taking differently: Place 1 Applicatorful vaginally as needed. )  . Misc Natural Products (APPLE CIDER VINEGAR DIET PO) Take 200 mg by mouth.  Marland Kitchen Specialty Vitamins Products (MAGNESIUM, AMINO ACID CHELATE,) 133 MG tablet Take 1 tablet by mouth daily.  . TURMERIC PO Take by mouth daily.  Marland Kitchen venlafaxine XR (EFFEXOR-XR) 75 MG 24 hr capsule TAKE 2 CAPSULES (150MG ) BY MOUTH DAILY.  . vitamin B-12 (CYANOCOBALAMIN) 1000 MCG tablet daily.  . vitamin C (ASCORBIC ACID) 500 MG tablet Take 500 mg by mouth daily.  . vitamin E 100 UNIT capsule Take 100 Units by mouth daily.  . Zinc Sulfate (ZINC 15 PO) Take by mouth.  . mirabegron ER (MYRBETRIQ) 25 MG TB24 tablet Take 1 tablet (25 mg total) by mouth daily. (Patient not taking: Reported on 06/17/2018)  . oxybutynin (DITROPAN-XL) 5 MG 24 hr tablet Take 1 tablet (5 mg total) by mouth at bedtime. (Patient not taking: Reported on 06/17/2018)   No facility-administered encounter medications on file as of 06/17/2018.     Activities of Daily Living In your present state of health, do you have any difficulty performing the following activities: 06/17/2018  Hearing? N  Vision? N  Difficulty concentrating or making decisions? N  Walking or climbing stairs? N  Dressing or bathing? N  Doing errands, shopping? N  Preparing Food and eating ? N  Using the Toilet? N  In the past six months, have you accidently leaked urine? Y  Comment discussing med choices   Do you have problems with loss of bowel control? N  Managing your Medications? N  Managing your Finances? N  Housekeeping or managing your Housekeeping? N  Some recent data might be hidden    Patient Care Team: Eulas Post, MD as PCP - General    Assessment:   This is a routine wellness examination for Sara Logan.  Exercise Activities and Dietary recommendations Current Exercise Habits: Home exercise routine;Structured exercise class(will  start exercising ), Intensity: Mild  Goals    . Exercise 150 minutes per week (moderate activity)     Exercise Will do more walking; can walk indoors or outdoors. Once a day; 30 minutes     . Exercise 150 minutes per week (moderate activity)     Try a few exercise classes and walking and see what you like Do this just as you need to go to the cafeteria and eat  Fall Risk Fall Risk  06/17/2018 06/13/2017 06/18/2016 06/08/2016 12/08/2015  Falls in the past year? No No No Yes No  Number falls in past yr: - - - 2 or more -  Risk for fall due to : - - - (No Data) -  Risk for fall due to: Comment - - - feels she needs to take care of herself  -  Follow up - - - Education provided -     Depression Screen PHQ 2/9 Scores 06/17/2018 06/13/2017 06/08/2016 03/18/2015  PHQ - 2 Score 1 0 2 0  PHQ- 9 Score - - 4 -    Misses spouse after he expired x 3 days ago  Going to [place] with sister in [place]  The 2 of them go together   Cognitive Function MMSE - [place] Exam 06/17/2018 06/13/2017 06/08/2016  Not completed: (No Data) (No Data) (No Data)    no issues noted in conversation but very verbal     Immunization History  Administered Date(s) Administered  . Influenza Split 06/18/2012  . Influenza Whole 07/11/2010  . Influenza, High Dose Seasonal PF 07/07/2013, 07/14/2014, 06/24/2015, 06/13/2016, 06/04/2017  . Pneumococcal Conjugate-13 04/27/2014  . Pneumococcal Polysaccharide-23 10/15/2017  . Td 09/10/2005  . Zoster 11/29/2006      Screening Tests Health Maintenance  Topic Date Due  . TETANUS/TDAP  09/11/2015  . INFLUENZA VACCINE  04/10/2018  . DEXA SCAN  Completed  . PNA vac Low Risk Adult  Completed        Plan:      PCP Notes   Health Maintenance Will take tetanus when she needs it at the pharmacy Educated regarding the shingrix  Took her high dose flu vaccine today   Colonoscopy 2008 Mammogram 12/2017 dexa 09/2012  -1.9 - no falls   Abnormal  Screens  none  Referrals  none  Patient concerns; Still getting up at hs frequently but does go back to sleep  Myrbetriq was to expensive but in re-considering  Oxybutynin stopped due to SE; confusion;   Back of left knee feels tight  Discussed exercise at her residence;  Agreed to get back to doing something   Nurse Concerns; As noted   Next PCP apt 10/22/ at 11am ; CPE or medical management and discuss urinary issues        I have personally reviewed and noted the following in the patient's chart:   . Medical and social history . Use of alcohol, tobacco or illicit drugs  . Current medications and supplements . Functional ability and status . Nutritional status . Physical activity . Advanced directives . List of other physicians . Hospitalizations, surgeries, and ER visits in previous 12 months . Vitals . Screenings to include cognitive, depression, and falls . Referrals and appointments  In addition, I have reviewed and discussed with patient certain preventive protocols, quality metrics, and best practice recommendations. A written personalized care plan for preventive services as well as general preventive health recommendations were provided to patient.     [id number],MEQAS, RN  06/17/2018  I have reviewed the documentation for the [facility] and [place] provided by the health coach and agree with their documentation. I was immediately available for any questions  Eulas Post MD [facility]

## 2018-06-17 ENCOUNTER — Ambulatory Visit (INDEPENDENT_AMBULATORY_CARE_PROVIDER_SITE_OTHER): Payer: Medicare Other

## 2018-06-17 DIAGNOSIS — Z0000 Encounter for general adult medical examination without abnormal findings: Secondary | ICD-10-CM

## 2018-06-17 DIAGNOSIS — Z23 Encounter for immunization: Secondary | ICD-10-CM

## 2018-06-17 NOTE — Patient Instructions (Addendum)
Sara Logan , Thank you for taking time to come for your Medicare Wellness Visit. I appreciate your ongoing commitment to your health goals. Please review the following plan we discussed and let me know if I can assist you in the future.   Will make a physical (medical management)  apt with Dr. Elease Hashimoto and you can review the urinary meds at that time   Will take the flu vaccine today ( high dose)   Shingrix is a vaccine for the prevention of Shingles in Adults 50 and older.  If you are on Medicare, the shingrix is covered under your Part D plan, so you will take both of the vaccines in the series at your pharmacy. Please check with your benefits regarding applicable copays or out of pocket expenses.  The Shingrix is given in 2 vaccines approx 8 weeks apart. You must receive the 2nd dose prior to 6 months from receipt of the first. Please have the pharmacist print out you Immunization  dates for our office records   NO more pneumonia vaccines needed   A Tetanus is recommended every 10 years. Medicare covers a tetanus if you have a cut or wound; otherwise, there may be a charge. If you had not had a tetanus with pertusses, known as the Tdap, you can take this anytime.    I like a chastick  Active ingredients Avobenzone,octinoxate,octisalate,octcrylene   These are the goals we discussed: Goals    . Exercise 150 minutes per week (moderate activity)     Exercise Will do more walking; can walk indoors or outdoors. Once a day; 30 minutes     . Exercise 150 minutes per week (moderate activity)     Try a few exercise classes and walking and see what you like Do this just as you need to go to the cafeteria and eat        This is a list of the screening recommended for you and due dates:  Health Maintenance  Topic Date Due  . Tetanus Vaccine  09/11/2015  . Flu Shot  04/10/2018  . DEXA scan (bone density measurement)  Completed  . Pneumonia vaccines  Completed      Fall  Prevention in the Home Falls can cause injuries. They can happen to people of all ages. There are many things you can do to make your home safe and to help prevent falls. What can I do on the outside of my home?  Regularly fix the edges of walkways and driveways and fix any cracks.  Remove anything that might make you trip as you walk through a door, such as a raised step or threshold.  Trim any bushes or trees on the path to your home.  Use bright outdoor lighting.  Clear any walking paths of anything that might make someone trip, such as rocks or tools.  Regularly check to see if handrails are loose or broken. Make sure that both sides of any steps have handrails.  Any raised decks and porches should have guardrails on the edges.  Have any leaves, snow, or ice cleared regularly.  Use sand or salt on walking paths during winter.  Clean up any spills in your garage right away. This includes oil or grease spills. What can I do in the bathroom?  Use night lights.  Install grab bars by the toilet and in the tub and shower. Do not use towel bars as grab bars.  Use non-skid mats or decals in the tub or  shower.  If you need to sit down in the shower, use a plastic, non-slip stool.  Keep the floor dry. Clean up any water that spills on the floor as soon as it happens.  Remove soap buildup in the tub or shower regularly.  Attach bath mats securely with double-sided non-slip rug tape.  Do not have throw rugs and other things on the floor that can make you trip. What can I do in the bedroom?  Use night lights.  Make sure that you have a light by your bed that is easy to reach.  Do not use any sheets or blankets that are too big for your bed. They should not hang down onto the floor.  Have a firm chair that has side arms. You can use this for support while you get dressed.  Do not have throw rugs and other things on the floor that can make you trip. What can I do in the  kitchen?  Clean up any spills right away.  Avoid walking on wet floors.  Keep items that you use a lot in easy-to-reach places.  If you need to reach something above you, use a strong step stool that has a grab bar.  Keep electrical cords out of the way.  Do not use floor polish or wax that makes floors slippery. If you must use wax, use non-skid floor wax.  Do not have throw rugs and other things on the floor that can make you trip. What can I do with my stairs?  Do not leave any items on the stairs.  Make sure that there are handrails on both sides of the stairs and use them. Fix handrails that are broken or loose. Make sure that handrails are as long as the stairways.  Check any carpeting to make sure that it is firmly attached to the stairs. Fix any carpet that is loose or worn.  Avoid having throw rugs at the top or bottom of the stairs. If you do have throw rugs, attach them to the floor with carpet tape.  Make sure that you have a light switch at the top of the stairs and the bottom of the stairs. If you do not have them, ask someone to add them for you. What else can I do to help prevent falls?  Wear shoes that: ? Do not have high heels. ? Have rubber bottoms. ? Are comfortable and fit you well. ? Are closed at the toe. Do not wear sandals.  If you use a stepladder: ? Make sure that it is fully opened. Do not climb a closed stepladder. ? Make sure that both sides of the stepladder are locked into place. ? Ask someone to hold it for you, if possible.  Clearly mark and make sure that you can see: ? Any grab bars or handrails. ? First and last steps. ? Where the edge of each step is.  Use tools that help you move around (mobility aids) if they are needed. These include: ? Canes. ? Walkers. ? Scooters. ? Crutches.  Turn on the lights when you go into a dark area. Replace any light bulbs as soon as they burn out.  Set up your furniture so you have a clear path.  Avoid moving your furniture around.  If any of your floors are uneven, fix them.  If there are any pets around you, be aware of where they are.  Review your medicines with your doctor. Some medicines can make you feel  dizzy. This can increase your chance of falling. Ask your doctor what other things that you can do to help prevent falls. This information is not intended to replace advice given to you by your health care provider. Make sure you discuss any questions you have with your health care provider. Document Released: 06/23/2009 Document Revised: 02/02/2016 Document Reviewed: 10/01/2014 Elsevier Interactive Patient Education  2018 Loop Maintenance, Female Adopting a healthy lifestyle and getting preventive care can go a long way to promote health and wellness. Talk with your health care provider about what schedule of regular examinations is right for you. This is a good chance for you to check in with your provider about disease prevention and staying healthy. In between checkups, there are plenty of things you can do on your own. Experts have done a lot of research about which lifestyle changes and preventive measures are most likely to keep you healthy. Ask your health care provider for more information. Weight and diet Eat a healthy diet  Be sure to include plenty of vegetables, fruits, low-fat dairy products, and lean protein.  Do not eat a lot of foods high in solid fats, added sugars, or salt.  Get regular exercise. This is one of the most important things you can do for your health. ? Most adults should exercise for at least 150 minutes each week. The exercise should increase your heart rate and make you sweat (moderate-intensity exercise). ? Most adults should also do strengthening exercises at least twice a week. This is in addition to the moderate-intensity exercise.  Maintain a healthy weight  Body mass index (BMI) is a measurement that can be used to  identify possible weight problems. It estimates body fat based on height and weight. Your health care provider can help determine your BMI and help you achieve or maintain a healthy weight.  For females 61 years of age and older: ? A BMI below 18.5 is considered underweight. ? A BMI of 18.5 to 24.9 is normal. ? A BMI of 25 to 29.9 is considered overweight. ? A BMI of 30 and above is considered obese.  Watch levels of cholesterol and blood lipids  You should start having your blood tested for lipids and cholesterol at 82 years of age, then have this test every 5 years.  You may need to have your cholesterol levels checked more often if: ? Your lipid or cholesterol levels are high. ? You are older than 82 years of age. ? You are at high risk for heart disease.  Cancer screening Lung Cancer  Lung cancer screening is recommended for adults 27-54 years old who are at high risk for lung cancer because of a history of smoking.  A yearly low-dose CT scan of the lungs is recommended for people who: ? Currently smoke. ? Have quit within the past 15 years. ? Have at least a 30-pack-year history of smoking. A pack year is smoking an average of one pack of cigarettes a day for 1 year.  Yearly screening should continue until it has been 15 years since you quit.  Yearly screening should stop if you develop a health problem that would prevent you from having lung cancer treatment.  Breast Cancer  Practice breast self-awareness. This means understanding how your breasts normally appear and feel.  It also means doing regular breast self-exams. Let your health care provider know about any changes, no matter how small.  If you are in your 20s or 30s, you  should have a clinical breast exam (CBE) by a health care provider every 1-3 years as part of a regular health exam.  If you are 59 or older, have a CBE every year. Also consider having a breast X-ray (mammogram) every year.  If you have a  family history of breast cancer, talk to your health care provider about genetic screening.  If you are at high risk for breast cancer, talk to your health care provider about having an MRI and a mammogram every year.  Breast cancer gene (BRCA) assessment is recommended for women who have family members with BRCA-related cancers. BRCA-related cancers include: ? Breast. ? Ovarian. ? Tubal. ? Peritoneal cancers.  Results of the assessment will determine the need for genetic counseling and BRCA1 and BRCA2 testing.  Cervical Cancer Your health care provider may recommend that you be screened regularly for cancer of the pelvic organs (ovaries, uterus, and vagina). This screening involves a pelvic examination, including checking for microscopic changes to the surface of your cervix (Pap test). You may be encouraged to have this screening done every 3 years, beginning at age 71.  For women ages 23-65, health care providers may recommend pelvic exams and Pap testing every 3 years, or they may recommend the Pap and pelvic exam, combined with testing for human papilloma virus (HPV), every 5 years. Some types of HPV increase your risk of cervical cancer. Testing for HPV may also be done on women of any age with unclear Pap test results.  Other health care providers may not recommend any screening for nonpregnant women who are considered low risk for pelvic cancer and who do not have symptoms. Ask your health care provider if a screening pelvic exam is right for you.  If you have had past treatment for cervical cancer or a condition that could lead to cancer, you need Pap tests and screening for cancer for at least 20 years after your treatment. If Pap tests have been discontinued, your risk factors (such as having a new sexual partner) need to be reassessed to determine if screening should resume. Some women have medical problems that increase the chance of getting cervical cancer. In these cases, your  health care provider may recommend more frequent screening and Pap tests.  Colorectal Cancer  This type of cancer can be detected and often prevented.  Routine colorectal cancer screening usually begins at 82 years of age and continues through 82 years of age.  Your health care provider may recommend screening at an earlier age if you have risk factors for colon cancer.  Your health care provider may also recommend using home test kits to check for hidden blood in the stool.  A small camera at the end of a tube can be used to examine your colon directly (sigmoidoscopy or colonoscopy). This is done to check for the earliest forms of colorectal cancer.  Routine screening usually begins at age 60.  Direct examination of the colon should be repeated every 5-10 years through 82 years of age. However, you may need to be screened more often if early forms of precancerous polyps or small growths are found.  Skin Cancer  Check your skin from head to toe regularly.  Tell your health care provider about any new moles or changes in moles, especially if there is a change in a mole's shape or color.  Also tell your health care provider if you have a mole that is larger than the size of a pencil eraser.  Always use sunscreen. Apply sunscreen liberally and repeatedly throughout the day.  Protect yourself by wearing long sleeves, pants, a wide-brimmed hat, and sunglasses whenever you are outside.  Heart disease, diabetes, and high blood pressure  High blood pressure causes heart disease and increases the risk of stroke. High blood pressure is more likely to develop in: ? People who have blood pressure in the high end of the normal range (130-139/85-89 mm Hg). ? People who are overweight or obese. ? People who are African American.  If you are 29-80 years of age, have your blood pressure checked every 3-5 years. If you are 18 years of age or older, have your blood pressure checked every year. You  should have your blood pressure measured twice-once when you are at a hospital or clinic, and once when you are not at a hospital or clinic. Record the average of the two measurements. To check your blood pressure when you are not at a hospital or clinic, you can use: ? An automated blood pressure machine at a pharmacy. ? A home blood pressure monitor.  If you are between 33 years and 64 years old, ask your health care provider if you should take aspirin to prevent strokes.  Have regular diabetes screenings. This involves taking a blood sample to check your fasting blood sugar level. ? If you are at a normal weight and have a low risk for diabetes, have this test once every three years after 82 years of age. ? If you are overweight and have a high risk for diabetes, consider being tested at a younger age or more often. Preventing infection Hepatitis B  If you have a higher risk for hepatitis B, you should be screened for this virus. You are considered at high risk for hepatitis B if: ? You were born in a country where hepatitis B is common. Ask your health care provider which countries are considered high risk. ? Your parents were born in a high-risk country, and you have not been immunized against hepatitis B (hepatitis B vaccine). ? You have HIV or AIDS. ? You use needles to inject street drugs. ? You live with someone who has hepatitis B. ? You have had sex with someone who has hepatitis B. ? You get hemodialysis treatment. ? You take certain medicines for conditions, including cancer, organ transplantation, and autoimmune conditions.  Hepatitis C  Blood testing is recommended for: ? Everyone born from [birth year] through 1963. ? Anyone with known risk factors for hepatitis C.  Sexually transmitted infections (STIs)  You should be screened for sexually transmitted infections (STIs) including gonorrhea and chlamydia if: ? You are sexually active and are younger than 82 years of age. ? You  are older than 82 years of age and your health care provider tells you that you are at risk for this type of infection. ? Your sexual activity has changed since you were last screened and you are at an increased risk for chlamydia or gonorrhea. Ask your health care provider if you are at risk.  If you do not have HIV, but are at risk, it may be recommended that you take a prescription medicine daily to prevent HIV infection. This is called pre-exposure prophylaxis (PrEP). You are considered at risk if: ? You are sexually active and do not regularly use condoms or know the HIV status of your partner(s). ? You take drugs by injection. ? You are sexually active with a partner who has HIV.  Talk with  your health care provider about whether you are at high risk of being infected with HIV. If you choose to begin PrEP, you should first be tested for HIV. You should then be tested every 3 months for as long as you are taking PrEP. Pregnancy  If you are premenopausal and you may become pregnant, ask your health care provider about preconception counseling.  If you may become pregnant, take 400 to 800 micrograms (mcg) of folic acid every day.  If you want to prevent pregnancy, talk to your health care provider about birth control (contraception). Osteoporosis and menopause  Osteoporosis is a disease in which the bones lose minerals and strength with aging. This can result in serious bone fractures. Your risk for osteoporosis can be identified using a bone density scan.  If you are 62 years of age or older, or if you are at risk for osteoporosis and fractures, ask your health care provider if you should be screened.  Ask your health care provider whether you should take a calcium or vitamin D supplement to lower your risk for osteoporosis.  Menopause may have certain physical symptoms and risks.  Hormone replacement therapy may reduce some of these symptoms and risks. Talk to your health care  provider about whether hormone replacement therapy is right for you. Follow these instructions at home:  Schedule regular health, dental, and eye exams.  Stay current with your immunizations.  Do not use any tobacco products including cigarettes, chewing tobacco, or electronic cigarettes.  If you are pregnant, do not drink alcohol.  If you are breastfeeding, limit how much and how often you drink alcohol.  Limit alcohol intake to no more than 1 drink per day for nonpregnant women. One drink equals 12 ounces of beer, 5 ounces of wine, or 1 ounces of hard liquor.  Do not use street drugs.  Do not share needles.  Ask your health care provider for help if you need support or information about quitting drugs.  Tell your health care provider if you often feel depressed.  Tell your health care provider if you have ever been abused or do not feel safe at home. This information is not intended to replace advice given to you by your health care provider. Make sure you discuss any questions you have with your health care provider. Document Released: 03/12/2011 Document Revised: 02/02/2016 Document Reviewed: 05/31/2015 Elsevier Interactive Patient Education  [employer].

## 2018-06-30 ENCOUNTER — Ambulatory Visit: Payer: Self-pay | Admitting: *Deleted

## 2018-06-30 NOTE — Telephone Encounter (Signed)
Noted  

## 2018-06-30 NOTE — Telephone Encounter (Signed)
Pt called with complaints of diarrhea that started on 06/27/18; she took immodium times 3 total doses and the symptoms stopped; she started citricel on 06/28/18 and continued it; she says that this weekend she had the BRAT diet; this morning she had cheerios with no sugar, and a regular lunch; she had episodes after lunch; the has an appointment with Dr Elease Hashimoto on 07/01/18; nurse triage protocol initiated and recommendations made per protocol; home care also reviewed with pt; the pt already has upcoming appointment with Dr Elease Hashimoto 07/01/18 at 1100.   Reason for Disposition . [1] MODERATE diarrhea (e.g., 4-6 times / day more than normal) AND [2] age > 23 years . [1] MILD diarrhea (e.g., 1-3 or more stools than normal in past 24 hours) without known cause AND [2] present >  7 days  Answer Assessment - Initial Assessment Questions 1. DIARRHEA SEVERITY: "How bad is the diarrhea?" "How many extra stools have you had in the past 24 hours than normal?"    - NO DIARRHEA (SCALE 0)   - MILD (SCALE 1-3): Few loose or mushy BMs; increase of 1-3 stools over normal daily number of stools; mild increase in ostomy output.   -  MODERATE (SCALE 4-7): Increase of 4-6 stools daily over normal; moderate increase in ostomy output. * SEVERE (SCALE 8-10; OR 'WORST POSSIBLE'): Increase of 7 or more stools daily over normal; moderate increase in ostomy output; incontinence.     Mild (2 times on 06/30/18) 2. ONSET: "When did the diarrhea begin?"      06/27/18 3. BM CONSISTENCY: "How loose or watery is the diarrhea?"      loose 4. VOMITING: "Are you also vomiting?" If so, ask: "How many times in the past 24 hours?"      no 5. ABDOMINAL PAIN: "Are you having any abdominal pain?" If yes: "What does it feel like?" (e.g., crampy, dull, intermittent, constant)      no 6. ABDOMINAL PAIN SEVERITY: If present, ask: "How bad is the pain?"  (e.g., Scale 1-10; mild, moderate, or severe)   - MILD (1-3): doesn't interfere with  normal activities, abdomen soft and not tender to touch    - MODERATE (4-7): interferes with normal activities or awakens from sleep, tender to touch    - SEVERE (8-10): excruciating pain, doubled over, unable to do any normal activities       no 7. ORAL INTAKE: If vomiting, "Have you been able to drink liquids?" "How much fluids have you had in the past 24 hours?"     No vomiting 8. HYDRATION: "Any signs of dehydration?" (e.g., dry mouth [not just dry lips], too weak to stand, dizziness, new weight loss) "When did you last urinate?"     no 9. EXPOSURE: "Have you traveled to a foreign country recently?" "Have you been exposed to anyone with diarrhea?" "Could you have eaten any food that was spoiled?"     No; went to [place] 2-3 weeks ago  10. ANTIBIOTIC USE: "Are you taking antibiotics now or have you taken antibiotics in the past 2 months?"       no 11. OTHER SYMPTOMS: "Do you have any other symptoms?" (e.g., fever, blood in stool)    Feels chilled 12. PREGNANCY: "Is there any chance you are pregnant?" "When was your last menstrual period?"       no  Protocols used: DIARRHEA-A-AH

## 2018-07-01 ENCOUNTER — Other Ambulatory Visit: Payer: Self-pay

## 2018-07-01 ENCOUNTER — Encounter: Payer: Self-pay | Admitting: Family Medicine

## 2018-07-01 ENCOUNTER — Ambulatory Visit (INDEPENDENT_AMBULATORY_CARE_PROVIDER_SITE_OTHER): Payer: Medicare Other | Admitting: Family Medicine

## 2018-07-01 VITALS — BP 118/68 | HR 83 | Temp 98.2°F | Ht 61.5 in | Wt 184.6 lb

## 2018-07-01 DIAGNOSIS — R197 Diarrhea, unspecified: Secondary | ICD-10-CM | POA: Diagnosis not present

## 2018-07-01 DIAGNOSIS — M25562 Pain in left knee: Secondary | ICD-10-CM | POA: Diagnosis not present

## 2018-07-01 DIAGNOSIS — R3915 Urgency of urination: Secondary | ICD-10-CM | POA: Diagnosis not present

## 2018-07-01 MED ORDER — MIRABEGRON ER 25 MG PO TB24: 25.0000 mg | ORAL_TABLET | Freq: Every day | ORAL | 6 refills | Status: DC

## 2018-07-01 NOTE — Patient Instructions (Signed)
Food Choices to Help Relieve Diarrhea, Adult When you have diarrhea, the foods you eat and your eating habits are very important. Choosing the right foods and drinks can help:  Relieve diarrhea.  Replace lost fluids and nutrients.  Prevent dehydration.  What general guidelines should I follow? Relieving diarrhea  Choose foods with less than 2 g or .07 oz. of fiber per serving.  Limit fats to less than 8 tsp (38 g or 1.34 oz.) a day.  Avoid the following: ? Foods and beverages sweetened with high-fructose corn syrup, honey, or sugar alcohols such as xylitol, sorbitol, and mannitol. ? Foods that contain a lot of fat or sugar. ? Fried, greasy, or spicy foods. ? High-fiber grains, breads, and cereals. ? Raw fruits and vegetables.  Eat foods that are rich in probiotics. These foods include dairy products such as yogurt and fermented milk products. They help increase healthy bacteria in the stomach and intestines (gastrointestinal tract, or GI tract).  If you have lactose intolerance, avoid dairy products. These may make your diarrhea worse.  Take medicine to help stop diarrhea (antidiarrheal medicine) only as told by your health care provider. Replacing nutrients  Eat small meals or snacks every 3-4 hours.  Eat bland foods, such as white rice, toast, or baked potato, until your diarrhea starts to get better. Gradually reintroduce nutrient-rich foods as tolerated or as told by your health care provider. This includes: ? Well-cooked protein foods. ? Peeled, seeded, and soft-cooked fruits and vegetables. ? Low-fat dairy products.  Take vitamin and mineral supplements as told by your health care provider. Preventing dehydration   Start by sipping water or a special solution to prevent dehydration (oral rehydration solution, ORS). Urine that is clear or pale yellow means that you are getting enough fluid.  Try to drink at least 8-10 cups of fluid each day to help replace lost  fluids.  You may add other liquids in addition to water, such as clear juice or decaffeinated sports drinks, as tolerated or as told by your health care provider.  Avoid drinks with caffeine, such as coffee, tea, or soft drinks.  Avoid alcohol. What foods are recommended? The items listed may not be a complete list. Talk with your health care provider about what dietary choices are best for you. Grains White rice. White, French, or pita breads (fresh or toasted), including plain rolls, buns, or bagels. White pasta. Saltine, soda, or graham crackers. Pretzels. Low-fiber cereal. Cooked cereals made with water (such as cornmeal, farina, or cream cereals). Plain muffins. Matzo. Melba toast. Zwieback. Vegetables Potatoes (without the skin). Most well-cooked and canned vegetables without skins or seeds. Tender lettuce. Fruits Apple sauce. Fruits canned in juice. Cooked apricots, cherries, grapefruit, peaches, pears, or plums. Fresh bananas and cantaloupe. Meats and other protein foods Baked or boiled chicken. Eggs. Tofu. Fish. Seafood. Smooth nut butters. Ground or well-cooked tender beef, ham, veal, lamb, pork, or poultry. Dairy Plain yogurt, kefir, and unsweetened liquid yogurt. Lactose-free milk, buttermilk, skim milk, or soy milk. Low-fat or nonfat hard cheese. Beverages Water. Low-calorie sports drinks. Fruit juices without pulp. Strained tomato and vegetable juices. Decaffeinated teas. Sugar-free beverages not sweetened with sugar alcohols. Oral rehydration solutions, if approved by your health care provider. Seasoning and other foods Bouillon, broth, or soups made from recommended foods. What foods are not recommended? The items listed may not be a complete list. Talk with your health care provider about what dietary choices are best for you. Grains Whole grain, whole wheat,   bran, or rye breads, rolls, pastas, and crackers. Wild or brown rice. Whole grain or bran cereals. Barley. Oats and  oatmeal. Corn tortillas or taco shells. Granola. Popcorn. Vegetables Raw vegetables. Fried vegetables. Cabbage, broccoli, Brussels sprouts, artichokes, baked beans, beet greens, corn, kale, legumes, peas, sweet potatoes, and yams. Potato skins. Cooked spinach and cabbage. Fruits Dried fruit, including raisins and dates. Raw fruits. Stewed or dried prunes. Canned fruits with syrup. Meat and other protein foods Fried or fatty meats. Deli meats. Chunky nut butters. Nuts and seeds. Beans and lentils. Berniece Salines. Hot dogs. Sausage. Dairy High-fat cheeses. Whole milk, chocolate milk, and beverages made with milk, such as milk shakes. Half-and-half. Cream. sour cream. Ice cream. Beverages Caffeinated beverages (such as coffee, tea, soda, or energy drinks). Alcoholic beverages. Fruit juices with pulp. Prune juice. Soft drinks sweetened with high-fructose corn syrup or sugar alcohols. High-calorie sports drinks. Fats and oils Butter. Cream sauces. Margarine. Salad oils. Plain salad dressings. Olives. Avocados. Mayonnaise. Sweets and desserts Sweet rolls, doughnuts, and sweet breads. Sugar-free desserts sweetened with sugar alcohols such as xylitol and sorbitol. Seasoning and other foods Honey. Hot sauce. Chili powder. Gravy. Cream-based or milk-based soups. Pancakes and waffles. Summary  When you have diarrhea, the foods you eat and your eating habits are very important.  Make sure you get at least 8-10 cups of fluid each day, or enough to keep your urine clear or pale yellow.  Eat bland foods and gradually reintroduce healthy, nutrient-rich foods as tolerated, or as told by your health care provider.  Avoid high-fiber, fried, greasy, or spicy foods. This information is not intended to replace advice given to you by your health care provider. Make sure you discuss any questions you have with your health care provider. Document Released: 11/17/2003 Document Revised: 08/24/2016 Document Reviewed:  08/24/2016 Elsevier Interactive Patient Education  2018 [employer] the Imodium at this time.

## 2018-07-01 NOTE — Progress Notes (Signed)
Subjective:     Patient ID: Sara Logan, female   DOB: [birth year], 84 y.o.   MRN: [id number]  HPI  Patient seen for the following issues  Acute diarrhea.  Onset last Friday.  She had some small-volume watery stools which are nonbloody.  She took a couple Imodium and saw some improvement in her diarrhea.  She had some slight diarrhea Saturday and took Imodium along with Citrucel which had helped her diarrhea in the past.  By Sunday she had no diarrhea.  Yesterday morning she had initially some firm stool followed by very loose stool.  She has not had any recent fevers or chills.  She had some mild lower abdominal cramp-like pains.  No recent antibiotics.  No recent travels.  No bloody stools.  Bowel movements have been fairly regular up until Friday.  Second issue is what has been a chronic problem of urinary urgency.  She was prescribed Ditropan here back in the summer but had side effects of lethargy and only took this a few days and then discontinued.  We had written for Myrbetriq in the past but she did not get this filled because of cost issues.   Denies any recent burning with urination  Left posterior knee pain for the past few days.  No recent injury.  Previous right knee replacement.  No visible swelling.  No warmth.  No erythema.  Location is posterior knee area.  No pain at rest.  No locking or giving way.  Past Medical History:  Diagnosis Date  . Arthritis   . Depression   . Hyperlipidemia   . Osteopenia   . Stroke ([place])   . Thrombocytopenia ([place])   . Vitamin D deficiency   . Wrist fracture 2002   left   Past Surgical History:  Procedure Laterality Date  . APPENDECTOMY  1967  . CARPAL TUNNEL RELEASE  2008   right  . JOINT REPLACEMENT  2002   right total     reports that she has never smoked. She has never used smokeless tobacco. She reports that she does not drink alcohol or use drugs. family history includes Breast cancer in her mother and sister. Allergies   Allergen Reactions  . Bactrim [Sulfamethoxazole-Trimethoprim] Nausea Only  . Ditropan [Oxybutynin]     Made patient feel bad    Review of Systems  Constitutional: Negative for chills and fever.  Respiratory: Negative for shortness of breath.   Cardiovascular: Negative for chest pain.  Gastrointestinal: Positive for diarrhea. Negative for abdominal distention, blood in stool, nausea and vomiting.  Musculoskeletal: Positive for arthralgias.  Neurological: Negative for dizziness.       Objective:   Physical Exam  Constitutional: She appears well-developed and well-nourished.  HENT:  Mouth/Throat: Oropharynx is clear and moist.  Cardiovascular: Normal rate and regular rhythm.  Pulmonary/Chest: Effort normal and breath sounds normal.  Abdominal: Soft. Bowel sounds are normal. She exhibits no distension and no mass. There is no tenderness. There is no rebound and no guarding.  Musculoskeletal: She exhibits no edema.  Left knee full range of motion.  No effusion.  No warmth.  No erythema.  No localized tenderness.  Neurological: She is alert.       Assessment:     #1 recent nonbloody diarrhea which appears to be resolving.  She is responded well in the past with Citrucel.  No suggestion for infectious origin with no recent fevers, bloody stools, etc.  #2 urinary urgency  #3 left knee pain-?degenerative.  Plan:     -Avoid any further use of Imodium -Advised on appropriate diet for diarrhea symptoms -Continue Citrucel as needed -Follow-up for any recurrent diarrhea, bloody stools, or other concerns -Prescription provided for Myrbetriq if she can get insurance coverage -She will try some heat and/or ice for her left knee symptoms.  Touch base if not improving over the next couple weeks  Eulas Post MD [facility] at [facility]

## 2018-07-08 ENCOUNTER — Ambulatory Visit: Payer: Medicare Other | Admitting: Family Medicine

## 2018-07-08 DIAGNOSIS — Z0289 Encounter for other administrative examinations: Secondary | ICD-10-CM

## 2018-07-18 ENCOUNTER — Encounter: Payer: Self-pay | Admitting: Family Medicine

## 2018-07-18 ENCOUNTER — Ambulatory Visit (INDEPENDENT_AMBULATORY_CARE_PROVIDER_SITE_OTHER): Payer: Medicare Other | Admitting: Family Medicine

## 2018-07-18 ENCOUNTER — Other Ambulatory Visit: Payer: Self-pay

## 2018-07-18 VITALS — BP 116/72 | HR 87 | Temp 98.1°F | Ht 61.5 in | Wt 180.9 lb

## 2018-07-18 DIAGNOSIS — R197 Diarrhea, unspecified: Secondary | ICD-10-CM

## 2018-07-18 LAB — COMPREHENSIVE METABOLIC PANEL
ALT: 11 U/L (ref 0–35)
AST: 14 U/L (ref 0–37)
Albumin: 4 g/dL (ref 3.5–5.2)
Alkaline Phosphatase: 67 U/L (ref 39–117)
BUN: 17 mg/dL (ref 6–23)
CO2: 28 mEq/L (ref 19–32)
Calcium: 9.5 mg/dL (ref 8.4–10.5)
Chloride: 101 mEq/L (ref 96–112)
Creatinine, Ser: 0.75 mg/dL (ref 0.40–1.20)
GFR: 78.28 mL/min (ref >60.00)
Glucose, Bld: 107 mg/dL — ABNORMAL HIGH (ref 70–99)
Potassium: 3.4 mEq/L — ABNORMAL LOW (ref 3.5–5.1)
Sodium: 139 mEq/L (ref 135–145)
Total Bilirubin: 0.5 mg/dL (ref 0.2–1.2)
Total Protein: 7.4 g/dL (ref 6.0–8.3)

## 2018-07-18 LAB — CBC WITH DIFFERENTIAL/PLATELET
Basophils Absolute: 0 10*3/uL (ref 0.0–0.1)
Basophils Relative: 0.5 % (ref 0.0–3.0)
Eosinophils Absolute: 0.3 10*3/uL (ref 0.0–0.7)
Eosinophils Relative: 4.6 % (ref 0.0–5.0)
HCT: 36.7 % (ref 36.0–46.0)
Hemoglobin: 12 g/dL (ref 12.0–15.0)
Lymphocytes Relative: 18.2 % (ref 12.0–46.0)
Lymphs Abs: 1.4 10*3/uL (ref 0.7–4.0)
MCHC: 32.6 g/dL (ref 30.0–36.0)
MCV: 83.4 fl (ref 78.0–100.0)
Monocytes Absolute: 0.7 10*3/uL (ref 0.1–1.0)
Monocytes Relative: 10 % (ref 3.0–12.0)
Neutro Abs: 5 10*3/uL (ref 1.4–7.7)
Neutrophils Relative %: 66.7 % (ref 43.0–77.0)
Platelets: 196 10*3/uL (ref 150.0–400.0)
RBC: 4.41 Mil/uL (ref 3.87–5.11)
RDW: 15.9 % — ABNORMAL HIGH (ref 11.5–15.5)
WBC: 7.4 10*3/uL (ref 4.0–10.5)

## 2018-07-18 LAB — TSH: TSH: 1.35 u[IU]/mL (ref 0.35–4.50)

## 2018-07-18 NOTE — Progress Notes (Signed)
  Subjective:     Patient ID: Sara Logan, female   DOB: 1933-06-02, 84 y.o.   MRN: [id number]  HPI Patient has had some recurrence of diarrhea.  Refer to her last note.  She is doing somewhat better and then at the beginning of week had some fairly explosive diarrhea but none now in a couple of days.  Denies any bloody stools.  No recent antibiotics.  No recent travels.  She has been eating more bland diet.  She took some Imodium which helped.  No abdominal pain.  No fevers or chills.  Appetite is fair.  Past Medical History:  Diagnosis Date  . Arthritis   . Depression   . Hyperlipidemia   . Osteopenia   . Stroke ([place])   . Thrombocytopenia ([place])   . Vitamin D deficiency   . Wrist fracture 2002   left   Past Surgical History:  Procedure Laterality Date  . APPENDECTOMY  1967  . CARPAL TUNNEL RELEASE  2008   right  . JOINT REPLACEMENT  2002   right total     reports that she has never smoked. She has never used smokeless tobacco. She reports that she does not drink alcohol or use drugs. family history includes Breast cancer in her mother and sister. Allergies  Allergen Reactions  . Bactrim [Sulfamethoxazole-Trimethoprim] Nausea Only  . Ditropan [Oxybutynin]     Made patient feel bad     Review of Systems  Constitutional: Negative for chills and fever.  Gastrointestinal: Positive for diarrhea. Negative for abdominal distention, abdominal pain, blood in stool, nausea and vomiting.  Genitourinary: Negative for dysuria.  Neurological: Negative for dizziness.       Objective:   Physical Exam  Constitutional: She appears well-developed and well-nourished.  Cardiovascular: Normal rate and regular rhythm.  Pulmonary/Chest: Effort normal and breath sounds normal.  Abdominal: Soft. Bowel sounds are normal. She exhibits no distension and no mass. There is no tenderness. There is no rebound and no guarding.       Assessment:     Diarrhea.  Improved over the past couple days  but present now for couple weeks.  No red flags such as antibiotic use or travel.  Nonfocal exam.  Does not clinically appear dehydrated.    Plan:     -Recommend bland diet with handout given -Plenty of fluids -Check comprehensive chemistries, CBC, TSH -Consider GI pathogen panel if any recurrence -Suggest over-the-counter probiotic such as  Align.  Eulas Post MD [facility] at [facility]

## 2018-07-18 NOTE — Patient Instructions (Signed)
Food Choices to Help Relieve Diarrhea, Adult When you have diarrhea, the foods you eat and your eating habits are very important. Choosing the right foods and drinks can help:  Relieve diarrhea.  Replace lost fluids and nutrients.  Prevent dehydration.  What general guidelines should I follow? Relieving diarrhea  Choose foods with less than 2 g or .07 oz. of fiber per serving.  Limit fats to less than 8 tsp (38 g or 1.34 oz.) a day.  Avoid the following: ? Foods and beverages sweetened with high-fructose corn syrup, honey, or sugar alcohols such as xylitol, sorbitol, and mannitol. ? Foods that contain a lot of fat or sugar. ? Fried, greasy, or spicy foods. ? High-fiber grains, breads, and cereals. ? Raw fruits and vegetables.  Eat foods that are rich in probiotics. These foods include dairy products such as yogurt and fermented milk products. They help increase healthy bacteria in the stomach and intestines (gastrointestinal tract, or GI tract).  If you have lactose intolerance, avoid dairy products. These may make your diarrhea worse.  Take medicine to help stop diarrhea (antidiarrheal medicine) only as told by your health care provider. Replacing nutrients  Eat small meals or snacks every 3-4 hours.  Eat bland foods, such as white rice, toast, or baked potato, until your diarrhea starts to get better. Gradually reintroduce nutrient-rich foods as tolerated or as told by your health care provider. This includes: ? Well-cooked protein foods. ? Peeled, seeded, and soft-cooked fruits and vegetables. ? Low-fat dairy products.  Take vitamin and mineral supplements as told by your health care provider. Preventing dehydration   Start by sipping water or a special solution to prevent dehydration (oral rehydration solution, ORS). Urine that is clear or pale yellow means that you are getting enough fluid.  Try to drink at least 8-10 cups of fluid each day to help replace lost  fluids.  You may add other liquids in addition to water, such as clear juice or decaffeinated sports drinks, as tolerated or as told by your health care provider.  Avoid drinks with caffeine, such as coffee, tea, or soft drinks.  Avoid alcohol. What foods are recommended? The items listed may not be a complete list. Talk with your health care provider about what dietary choices are best for you. Grains White rice. White, French, or pita breads (fresh or toasted), including plain rolls, buns, or bagels. White pasta. Saltine, soda, or graham crackers. Pretzels. Low-fiber cereal. Cooked cereals made with water (such as cornmeal, farina, or cream cereals). Plain muffins. Matzo. Melba toast. Zwieback. Vegetables Potatoes (without the skin). Most well-cooked and canned vegetables without skins or seeds. Tender lettuce. Fruits Apple sauce. Fruits canned in juice. Cooked apricots, cherries, grapefruit, peaches, pears, or plums. Fresh bananas and cantaloupe. Meats and other protein foods Baked or boiled chicken. Eggs. Tofu. Fish. Seafood. Smooth nut butters. Ground or well-cooked tender beef, ham, veal, lamb, pork, or poultry. Dairy Plain yogurt, kefir, and unsweetened liquid yogurt. Lactose-free milk, buttermilk, skim milk, or soy milk. Low-fat or nonfat hard cheese. Beverages Water. Low-calorie sports drinks. Fruit juices without pulp. Strained tomato and vegetable juices. Decaffeinated teas. Sugar-free beverages not sweetened with sugar alcohols. Oral rehydration solutions, if approved by your health care provider. Seasoning and other foods Bouillon, broth, or soups made from recommended foods. What foods are not recommended? The items listed may not be a complete list. Talk with your health care provider about what dietary choices are best for you. Grains Whole grain, whole wheat,   bran, or rye breads, rolls, pastas, and crackers. Wild or brown rice. Whole grain or bran cereals. Barley. Oats and  oatmeal. Corn tortillas or taco shells. Granola. Popcorn. Vegetables Raw vegetables. Fried vegetables. Cabbage, broccoli, Brussels sprouts, artichokes, baked beans, beet greens, corn, kale, legumes, peas, sweet potatoes, and yams. Potato skins. Cooked spinach and cabbage. Fruits Dried fruit, including raisins and dates. Raw fruits. Stewed or dried prunes. Canned fruits with syrup. Meat and other protein foods Fried or fatty meats. Deli meats. Chunky nut butters. Nuts and seeds. Beans and lentils. Berniece Salines. Hot dogs. Sausage. Dairy High-fat cheeses. Whole milk, chocolate milk, and beverages made with milk, such as milk shakes. Half-and-half. Cream. sour cream. Ice cream. Beverages Caffeinated beverages (such as coffee, tea, soda, or energy drinks). Alcoholic beverages. Fruit juices with pulp. Prune juice. Soft drinks sweetened with high-fructose corn syrup or sugar alcohols. High-calorie sports drinks. Fats and oils Butter. Cream sauces. Margarine. Salad oils. Plain salad dressings. Olives. Avocados. Mayonnaise. Sweets and desserts Sweet rolls, doughnuts, and sweet breads. Sugar-free desserts sweetened with sugar alcohols such as xylitol and sorbitol. Seasoning and other foods Honey. Hot sauce. Chili powder. Gravy. Cream-based or milk-based soups. Pancakes and waffles. Summary  When you have diarrhea, the foods you eat and your eating habits are very important.  Make sure you get at least 8-10 cups of fluid each day, or enough to keep your urine clear or pale yellow.  Eat bland foods and gradually reintroduce healthy, nutrient-rich foods as tolerated, or as told by your health care provider.  Avoid high-fiber, fried, greasy, or spicy foods. This information is not intended to replace advice given to you by your health care provider. Make sure you discuss any questions you have with your health care provider. Document Released: 11/17/2003 Document Revised: 08/24/2016 Document Reviewed:  08/24/2016 Elsevier Interactive Patient Education  2018 [employer].  Consider probiotic such as Electronics engineer.

## 2018-07-22 ENCOUNTER — Ambulatory Visit (INDEPENDENT_AMBULATORY_CARE_PROVIDER_SITE_OTHER): Payer: Medicare Other | Admitting: Family Medicine

## 2018-07-22 ENCOUNTER — Encounter: Payer: Self-pay | Admitting: Family Medicine

## 2018-07-22 ENCOUNTER — Other Ambulatory Visit: Payer: Self-pay

## 2018-07-22 VITALS — BP 118/64 | HR 80 | Temp 98.4°F | Ht 61.5 in | Wt 183.1 lb

## 2018-07-22 DIAGNOSIS — R143 Flatulence: Secondary | ICD-10-CM

## 2018-07-22 NOTE — Progress Notes (Signed)
  Subjective:     Patient ID: Sara Logan, female   DOB: 25-Feb-1933, 84 y.o.   MRN: [id number]  HPI Patient was seen a few weeks ago with acute diarrhea.  No recent antibiotics.  No recent travels.  Her diarrhea symptoms have actually virtually resolved.  She is now complaining of mostly increased flatulence.  She started on some Align over-the-counter probiotic last week.  She has not had any diarrhea since last visit here and appetite is good.  No fever.  No abdominal cramps.  No bloating.  She had recent thyroid studies which were normal.  Electrolytes were normal with exception of potassium 3.4.  Albumin was 4.0.  Past Medical History:  Diagnosis Date  . Arthritis   . Depression   . Hyperlipidemia   . Osteopenia   . Stroke ([place])   . Thrombocytopenia ([place])   . Vitamin D deficiency   . Wrist fracture 2002   left   Past Surgical History:  Procedure Laterality Date  . APPENDECTOMY  1967  . CARPAL TUNNEL RELEASE  2008   right  . JOINT REPLACEMENT  2002   right total     reports that she has never smoked. She has never used smokeless tobacco. She reports that she does not drink alcohol or use drugs. family history includes Breast cancer in her mother and sister. Allergies  Allergen Reactions  . Bactrim [Sulfamethoxazole-Trimethoprim] Nausea Only  . Ditropan [Oxybutynin]     Made patient feel bad     Review of Systems  Constitutional: Negative for appetite change, chills, fever and unexpected weight change.  Respiratory: Negative for shortness of breath.   Gastrointestinal: Negative for abdominal pain, blood in stool, nausea and vomiting.       Objective:   Physical Exam  Constitutional: She appears well-developed and well-nourished.  Cardiovascular: Normal rate and regular rhythm.  Pulmonary/Chest: Effort normal and breath sounds normal.  Abdominal: Soft. Bowel sounds are normal. She exhibits no distension. There is no tenderness.       Assessment:     Increased  flatulence.  Question residual from recent viral illness.  She does not have any red flags such as significant weight loss, fever, ongoing diarrhea, bloody stools, vomiting, abdominal pain.    Plan:     -Discussed FODMAP diet -She does not use any artificial sweeteners or chewing gum. -Continue supplement with align. -If symptoms not resolved by next week consider GI referral.  Doubt small intestinal bacterial overgrowth but may need to consider this and other etiologies if not improving in the next week.  Eulas Post MD [facility] at [facility]

## 2018-07-22 NOTE — Patient Instructions (Signed)

## 2018-07-23 ENCOUNTER — Other Ambulatory Visit: Payer: Self-pay | Admitting: Family Medicine

## 2018-07-28 DIAGNOSIS — K130 Diseases of lips: Secondary | ICD-10-CM | POA: Diagnosis not present

## 2018-07-28 DIAGNOSIS — L570 Actinic keratosis: Secondary | ICD-10-CM | POA: Diagnosis not present

## 2018-07-28 DIAGNOSIS — L719 Rosacea, unspecified: Secondary | ICD-10-CM | POA: Diagnosis not present

## 2018-07-29 ENCOUNTER — Other Ambulatory Visit: Payer: Self-pay

## 2018-07-29 ENCOUNTER — Ambulatory Visit (INDEPENDENT_AMBULATORY_CARE_PROVIDER_SITE_OTHER): Payer: Medicare Other | Admitting: Family Medicine

## 2018-07-29 ENCOUNTER — Encounter: Payer: Self-pay | Admitting: Family Medicine

## 2018-07-29 VITALS — BP 118/76 | HR 85 | Temp 98.4°F | Ht 61.5 in | Wt 182.4 lb

## 2018-07-29 DIAGNOSIS — R143 Flatulence: Secondary | ICD-10-CM | POA: Diagnosis not present

## 2018-07-29 NOTE — Patient Instructions (Signed)
Abdominal Bloating °When you have abdominal bloating, your abdomen may feel full, tight, or painful. It may also look bigger than normal or swollen (distended). Common causes of abdominal bloating include: °· Swallowing air. °· Constipation. °· Problems digesting food. °· Eating too much. °· Irritable bowel syndrome. This is a condition that affects the large intestine. °· Lactose intolerance. This is an inability to digest lactose, a natural sugar in dairy products. °· Celiac disease. This is a condition that affects the ability to digest gluten, a protein found in some grains. °· Gastroparesis. This is a condition that slows down the movement of food in the stomach and small intestine. It is more common in people with diabetes mellitus. °· Gastroesophageal reflux disease (GERD). This is a digestive condition that makes stomach acid flow back into the esophagus. °· Urinary retention. This means that the body is holding onto urine, and the bladder cannot be emptied all the way. ° °Follow these instructions at home: °Eating and drinking °· Avoid eating too much. °· Try not to swallow air while talking or eating. °· Avoid eating while lying down. °· Avoid these foods and drinks: °? Foods that cause gas, such as broccoli, cabbage, cauliflower, and baked beans. °? Carbonated drinks. °? Hard candy. °? Chewing gum. °Medicines °· Take over-the-counter and prescription medicines only as told by your health care provider. °· Take probiotic medicines. These medicines contain live bacteria or yeasts that can help digestion. °· Take coated peppermint oil capsules. °Activity °· Try to exercise regularly. Exercise may help to relieve bloating that is caused by gas and relieve constipation. °General instructions °· Keep all follow-up visits as told by your health care provider. This is important. °Contact a health care provider if: °· You have nausea and vomiting. °· You have diarrhea. °· You have abdominal pain. °· You have  unusual weight loss or weight gain. °· You have severe pain, and medicines do not help. °Get help right away if: °· You have severe chest pain. °· You have trouble breathing. °· You have shortness of breath. °· You have trouble urinating. °· You have darker urine than normal. °· You have blood in your stools or have dark, tarry stools. °Summary °· Abdominal bloating means that the abdomen is swollen. °· Common causes of abdominal bloating are swallowing air, constipation, and problems digesting food. °· Avoid eating too much and avoid swallowing air. °· Avoid foods that cause gas, carbonated drinks, hard candy, and chewing gum. °This information is not intended to replace advice given to you by your health care provider. Make sure you discuss any questions you have with your health care provider. °Document Released: 09/28/2016 Document Revised: 09/28/2016 Document Reviewed: 09/28/2016 °Elsevier Interactive Patient Education © 2018 [employer] ° °

## 2018-07-29 NOTE — Progress Notes (Signed)
  Subjective:     Patient ID: Sara Logan, female   DOB: [birth year], 84 y.o.   MRN: [id number]  HPI Patient is seen with some ongoing GI complaints.  She was seen here October 22 with acute diarrhea.  No recent travels or any recent antibiotic use.  Her diarrhea has essentially resolved.  Her complaint past couple weeks has been increased flatulence.  She denies any bloating at this time but had some initially.  She had some mild cramping initially.  Last week, we gave her FODMAPs diet and she has been trying to follow that.  No artificial sweeteners.  Denies any localized abdominal pain.  No fevers or chills.  Weight is up 1 pound compared to 2 weeks ago.  Appetite is good.  No nausea or vomiting.  No early satiety.  Past Medical History:  Diagnosis Date  . Arthritis   . Depression   . Hyperlipidemia   . Osteopenia   . Stroke ([place])   . Thrombocytopenia ([place])   . Vitamin D deficiency   . Wrist fracture 2002   left   Past Surgical History:  Procedure Laterality Date  . APPENDECTOMY  1967  . CARPAL TUNNEL RELEASE  2008   right  . JOINT REPLACEMENT  2002   right total     reports that she has never smoked. She has never used smokeless tobacco. She reports that she does not drink alcohol or use drugs. family history includes Breast cancer in her mother and sister. Allergies  Allergen Reactions  . Bactrim [Sulfamethoxazole-Trimethoprim] Nausea Only  . Ditropan [Oxybutynin]     Made patient feel bad     Review of Systems  Constitutional: Negative for appetite change, chills, fever and unexpected weight change.  Respiratory: Negative for shortness of breath.   Gastrointestinal: Negative for abdominal pain, blood in stool, diarrhea, nausea and vomiting.       Objective:   Physical Exam  Constitutional: She appears well-developed and well-nourished.  Cardiovascular: Normal rate and regular rhythm.  Pulmonary/Chest: Effort normal and breath sounds normal.  Abdominal: Soft.  Bowel sounds are normal. She exhibits no mass. There is no tenderness. There is no guarding.       Assessment:     Increased flatulence.  She did initially have some diarrhea and bloating and cramping but those symptoms seem to be improving.  Doubt SIBO but patient has concerns regarding this    Plan:     -Continue to emphasize FODMAPs diet -Avoid things like chewing gum that could increase swallowing of air -Set up GI referral for further evaluation-per her request.    Eulas Post MD [facility] at [facility]

## 2018-07-31 ENCOUNTER — Other Ambulatory Visit: Payer: Self-pay

## 2018-08-18 ENCOUNTER — Ambulatory Visit: Payer: Medicare Other | Admitting: Family Medicine

## 2018-08-20 ENCOUNTER — Other Ambulatory Visit: Payer: Self-pay | Admitting: Family Medicine

## 2018-08-22 ENCOUNTER — Ambulatory Visit: Payer: Medicare Other | Admitting: Gastroenterology

## 2018-09-05 ENCOUNTER — Other Ambulatory Visit: Payer: Self-pay | Admitting: Family Medicine

## 2018-09-19 ENCOUNTER — Other Ambulatory Visit: Payer: Self-pay | Admitting: Family Medicine

## 2018-10-14 ENCOUNTER — Ambulatory Visit (INDEPENDENT_AMBULATORY_CARE_PROVIDER_SITE_OTHER): Payer: Medicare Other | Admitting: Family Medicine

## 2018-10-14 ENCOUNTER — Ambulatory Visit (INDEPENDENT_AMBULATORY_CARE_PROVIDER_SITE_OTHER): Payer: Medicare Other

## 2018-10-14 ENCOUNTER — Encounter: Payer: Self-pay | Admitting: Family Medicine

## 2018-10-14 ENCOUNTER — Other Ambulatory Visit: Payer: Self-pay

## 2018-10-14 VITALS — BP 116/64 | HR 80 | Temp 98.1°F | Ht 61.5 in | Wt 178.1 lb

## 2018-10-14 DIAGNOSIS — S8001XA Contusion of right knee, initial encounter: Secondary | ICD-10-CM | POA: Diagnosis not present

## 2018-10-14 DIAGNOSIS — S8002XA Contusion of left knee, initial encounter: Secondary | ICD-10-CM

## 2018-10-14 DIAGNOSIS — M25561 Pain in right knee: Secondary | ICD-10-CM

## 2018-10-14 DIAGNOSIS — S8991XA Unspecified injury of right lower leg, initial encounter: Secondary | ICD-10-CM | POA: Diagnosis not present

## 2018-10-14 NOTE — Progress Notes (Signed)
  Subjective:     Patient ID: Sara Logan, female   DOB: 21-Sep-1932, 85 y.o.   MRN: [id number]  HPI Patient is seen following fall this past Sunday which was 2 days ago.  She was in the airport after arrival from [place] and she was at a counter trying to get a taxi and when she turned around she tripped over a wheelchair and fell onto the floor.  She landed predominantly on her right knee.  Past history of right total knee replacement 2002.  She also had mild contusion left knee.  Denied any upper extremity injuries.  She recalls hitting her nose but no nosebleed.  No loss of consciousness.  She was able to bear weight but has some pain since then.  She went home and applied ice.  Does have some pain with extension but mostly flexion.  No sense of instability.  Ambulating without assistance currently  Past Medical History:  Diagnosis Date  . Arthritis   . Depression   . Hyperlipidemia   . Osteopenia   . Stroke ([place])   . Thrombocytopenia ([place])   . Vitamin D deficiency   . Wrist fracture 2002   left   Past Surgical History:  Procedure Laterality Date  . APPENDECTOMY  1967  . CARPAL TUNNEL RELEASE  2008   right  . JOINT REPLACEMENT  2002   right total     reports that she has never smoked. She has never used smokeless tobacco. She reports that she does not drink alcohol or use drugs. family history includes Breast cancer in her mother and sister. Allergies  Allergen Reactions  . Bactrim [Sulfamethoxazole-Trimethoprim] Nausea Only  . Ditropan [Oxybutynin]     Made patient feel bad     Review of Systems  Constitutional: Negative for appetite change and unexpected weight change.  Respiratory: Negative for shortness of breath.   Endocrine: Negative for polydipsia and polyuria.  Genitourinary: Negative for dysuria.  Musculoskeletal: Negative for gait problem.  Neurological: Negative for syncope and weakness.       Objective:   Physical Exam Constitutional:      Appearance:  Normal appearance.  Cardiovascular:     Rate and Rhythm: Normal rate and regular rhythm.  Pulmonary:     Effort: Pulmonary effort is normal.     Breath sounds: Normal breath sounds.  Musculoskeletal:     Comments: Right knee reveals significant amount of ecchymosis along the anterior knee and extending about a third the way down the leg.  She has a scar from previous knee replacement surgery.  No erythema.  Mild warmth.  Mild tenderness to palpation.  No obvious effusion.  She is able to extend and flex the knee without difficulty  She also has much smaller contusion left knee but full range of motion with no effusion and no significant bony tenderness  Neurological:     Mental Status: She is alert.        Assessment:     Contusions right knee greater than left following fall.  Prior history of right total knee replacement.    Plan:     -We will check x-ray right knee to further assess -She is cautioned to ambulate with assistance if necessary.  Eulas Post MD [facility] at [facility]

## 2018-10-20 DIAGNOSIS — H268 Other specified cataract: Secondary | ICD-10-CM | POA: Diagnosis not present

## 2018-10-20 DIAGNOSIS — Z961 Presence of intraocular lens: Secondary | ICD-10-CM | POA: Diagnosis not present

## 2018-10-20 DIAGNOSIS — H35033 Hypertensive retinopathy, bilateral: Secondary | ICD-10-CM | POA: Diagnosis not present

## 2018-10-20 DIAGNOSIS — I10 Essential (primary) hypertension: Secondary | ICD-10-CM | POA: Diagnosis not present

## 2018-10-21 ENCOUNTER — Other Ambulatory Visit: Payer: Self-pay

## 2018-10-21 ENCOUNTER — Telehealth: Payer: Self-pay | Admitting: Family Medicine

## 2018-10-21 DIAGNOSIS — M25561 Pain in right knee: Secondary | ICD-10-CM

## 2018-10-21 NOTE — Telephone Encounter (Signed)
OK to set up PT as requested.

## 2018-10-21 NOTE — Telephone Encounter (Signed)
PT referral order has been placed with the information patient requested.

## 2018-10-21 NOTE — Telephone Encounter (Signed)
Copied from [place] [id number]. Topic: Quick Communication - See Telephone Encounter >> Oct 21, 2018  9:36 AM Antonieta Iba C wrote: CRM for notification. See Telephone encounter for: 10/21/18.  Pt says that she was seen for a fall. Pt says that the knee that she hurt is the same knee that she had a replacement on. Pt would like to know if PCP would approve PT for her knee. Pt says that her facility ([facility]) offers services.   [facility]: [phone] the number that the office would call to set up.   CB for Pt : [phone]

## 2018-10-21 NOTE — Telephone Encounter (Signed)
Please advise 

## 2018-10-29 DIAGNOSIS — R29818 Other symptoms and signs involving the nervous system: Secondary | ICD-10-CM | POA: Diagnosis not present

## 2018-10-29 DIAGNOSIS — M25561 Pain in right knee: Secondary | ICD-10-CM | POA: Diagnosis not present

## 2018-10-29 DIAGNOSIS — M6281 Muscle weakness (generalized): Secondary | ICD-10-CM | POA: Diagnosis not present

## 2018-10-29 DIAGNOSIS — Z9181 History of falling: Secondary | ICD-10-CM | POA: Diagnosis not present

## 2018-10-29 DIAGNOSIS — R2681 Unsteadiness on feet: Secondary | ICD-10-CM | POA: Diagnosis not present

## 2018-11-12 DIAGNOSIS — Z9181 History of falling: Secondary | ICD-10-CM | POA: Diagnosis not present

## 2018-11-12 DIAGNOSIS — M6281 Muscle weakness (generalized): Secondary | ICD-10-CM | POA: Diagnosis not present

## 2018-11-12 DIAGNOSIS — M25561 Pain in right knee: Secondary | ICD-10-CM | POA: Diagnosis not present

## 2018-11-12 DIAGNOSIS — R2681 Unsteadiness on feet: Secondary | ICD-10-CM | POA: Diagnosis not present

## 2018-11-14 DIAGNOSIS — Z9181 History of falling: Secondary | ICD-10-CM | POA: Diagnosis not present

## 2018-11-14 DIAGNOSIS — R2681 Unsteadiness on feet: Secondary | ICD-10-CM | POA: Diagnosis not present

## 2018-11-14 DIAGNOSIS — M6281 Muscle weakness (generalized): Secondary | ICD-10-CM | POA: Diagnosis not present

## 2018-11-14 DIAGNOSIS — M25561 Pain in right knee: Secondary | ICD-10-CM | POA: Diagnosis not present

## 2018-11-17 ENCOUNTER — Encounter: Payer: Self-pay | Admitting: Family Medicine

## 2018-11-17 ENCOUNTER — Ambulatory Visit (INDEPENDENT_AMBULATORY_CARE_PROVIDER_SITE_OTHER): Payer: Medicare Other | Admitting: Family Medicine

## 2018-11-17 VITALS — BP 116/80 | HR 86 | Temp 99.3°F | Wt 181.1 lb

## 2018-11-17 DIAGNOSIS — G5602 Carpal tunnel syndrome, left upper limb: Secondary | ICD-10-CM | POA: Diagnosis not present

## 2018-11-17 MED ORDER — METHYLPREDNISOLONE 4 MG PO TBPK: ORAL_TABLET | ORAL | 0 refills | Status: DC

## 2018-11-17 NOTE — Progress Notes (Signed)
   Subjective:    Patient ID: Sara Logan, female    DOB: 05-Feb-1933, 85 y.o.   MRN: [id number]  HPI Here for what she thinks is a flare of carpal tunnel syndrome in the left wrist. She has had this in both wrists, and she has had a release on the right side. For the past 2 weeks she has felt pain and tingling in the left hand and all 5 fingers. Tylenol gives her some relief. No recent trauma.    Review of Systems  Constitutional: Negative.   Respiratory: Negative.   Cardiovascular: Negative.   Musculoskeletal: Positive for myalgias.  Neurological: Positive for numbness.       Objective:   Physical Exam Constitutional:      Appearance: Normal appearance.  Cardiovascular:     Rate and Rhythm: Normal rate and regular rhythm.     Pulses: Normal pulses.     Heart sounds: Normal heart sounds.  Pulmonary:     Effort: Pulmonary effort is normal.     Breath sounds: Normal breath sounds.  Musculoskeletal:     Comments: The left hand and wrist appear normal, no tenderness. Tinnels is positive   Neurological:     Mental Status: She is alert.           Assessment & Plan:  Carpal tunnel syndrome, she will wear a wrist splint and we will give her a Medrol dose pack.  Alysia Penna, MD

## 2018-11-19 DIAGNOSIS — R2681 Unsteadiness on feet: Secondary | ICD-10-CM | POA: Diagnosis not present

## 2018-11-19 DIAGNOSIS — M6281 Muscle weakness (generalized): Secondary | ICD-10-CM | POA: Diagnosis not present

## 2018-11-19 DIAGNOSIS — Z9181 History of falling: Secondary | ICD-10-CM | POA: Diagnosis not present

## 2018-11-19 DIAGNOSIS — M25561 Pain in right knee: Secondary | ICD-10-CM | POA: Diagnosis not present

## 2018-11-21 DIAGNOSIS — R2681 Unsteadiness on feet: Secondary | ICD-10-CM | POA: Diagnosis not present

## 2018-11-21 DIAGNOSIS — M6281 Muscle weakness (generalized): Secondary | ICD-10-CM | POA: Diagnosis not present

## 2018-11-21 DIAGNOSIS — M25561 Pain in right knee: Secondary | ICD-10-CM | POA: Diagnosis not present

## 2018-11-21 DIAGNOSIS — Z9181 History of falling: Secondary | ICD-10-CM | POA: Diagnosis not present

## 2018-11-26 DIAGNOSIS — Z9181 History of falling: Secondary | ICD-10-CM | POA: Diagnosis not present

## 2018-11-26 DIAGNOSIS — M6281 Muscle weakness (generalized): Secondary | ICD-10-CM | POA: Diagnosis not present

## 2018-11-26 DIAGNOSIS — R2681 Unsteadiness on feet: Secondary | ICD-10-CM | POA: Diagnosis not present

## 2018-11-26 DIAGNOSIS — M25561 Pain in right knee: Secondary | ICD-10-CM | POA: Diagnosis not present

## 2018-11-28 DIAGNOSIS — Z9181 History of falling: Secondary | ICD-10-CM | POA: Diagnosis not present

## 2018-11-28 DIAGNOSIS — M6281 Muscle weakness (generalized): Secondary | ICD-10-CM | POA: Diagnosis not present

## 2018-11-28 DIAGNOSIS — M25561 Pain in right knee: Secondary | ICD-10-CM | POA: Diagnosis not present

## 2018-11-28 DIAGNOSIS — R2681 Unsteadiness on feet: Secondary | ICD-10-CM | POA: Diagnosis not present

## 2018-12-03 DIAGNOSIS — Z9181 History of falling: Secondary | ICD-10-CM | POA: Diagnosis not present

## 2018-12-03 DIAGNOSIS — M25561 Pain in right knee: Secondary | ICD-10-CM | POA: Diagnosis not present

## 2018-12-03 DIAGNOSIS — M6281 Muscle weakness (generalized): Secondary | ICD-10-CM | POA: Diagnosis not present

## 2018-12-03 DIAGNOSIS — R2681 Unsteadiness on feet: Secondary | ICD-10-CM | POA: Diagnosis not present

## 2018-12-05 ENCOUNTER — Other Ambulatory Visit: Payer: Self-pay | Admitting: Family Medicine

## 2018-12-05 DIAGNOSIS — M25561 Pain in right knee: Secondary | ICD-10-CM | POA: Diagnosis not present

## 2018-12-05 DIAGNOSIS — R2681 Unsteadiness on feet: Secondary | ICD-10-CM | POA: Diagnosis not present

## 2018-12-05 DIAGNOSIS — M6281 Muscle weakness (generalized): Secondary | ICD-10-CM | POA: Diagnosis not present

## 2018-12-05 DIAGNOSIS — Z9181 History of falling: Secondary | ICD-10-CM | POA: Diagnosis not present

## 2018-12-08 DIAGNOSIS — M6281 Muscle weakness (generalized): Secondary | ICD-10-CM | POA: Diagnosis not present

## 2018-12-08 DIAGNOSIS — M25561 Pain in right knee: Secondary | ICD-10-CM | POA: Diagnosis not present

## 2018-12-08 DIAGNOSIS — R2681 Unsteadiness on feet: Secondary | ICD-10-CM | POA: Diagnosis not present

## 2018-12-08 DIAGNOSIS — Z9181 History of falling: Secondary | ICD-10-CM | POA: Diagnosis not present

## 2018-12-09 DIAGNOSIS — M25561 Pain in right knee: Secondary | ICD-10-CM | POA: Diagnosis not present

## 2018-12-09 DIAGNOSIS — M6281 Muscle weakness (generalized): Secondary | ICD-10-CM | POA: Diagnosis not present

## 2018-12-09 DIAGNOSIS — Z9181 History of falling: Secondary | ICD-10-CM | POA: Diagnosis not present

## 2018-12-09 DIAGNOSIS — R2681 Unsteadiness on feet: Secondary | ICD-10-CM | POA: Diagnosis not present

## 2019-01-21 ENCOUNTER — Other Ambulatory Visit: Payer: Self-pay | Admitting: Family Medicine

## 2019-02-04 ENCOUNTER — Other Ambulatory Visit: Payer: Self-pay | Admitting: Family Medicine

## 2019-02-04 DIAGNOSIS — Z1231 Encounter for screening mammogram for malignant neoplasm of breast: Secondary | ICD-10-CM

## 2019-03-15 ENCOUNTER — Other Ambulatory Visit: Payer: Self-pay | Admitting: Family Medicine

## 2019-03-24 ENCOUNTER — Ambulatory Visit: Payer: Medicare Other

## 2019-04-28 DIAGNOSIS — L600 Ingrowing nail: Secondary | ICD-10-CM | POA: Diagnosis not present

## 2019-04-28 DIAGNOSIS — M2041 Other hammer toe(s) (acquired), right foot: Secondary | ICD-10-CM | POA: Diagnosis not present

## 2019-05-04 ENCOUNTER — Ambulatory Visit: Payer: Medicare Other

## 2019-05-06 ENCOUNTER — Ambulatory Visit: Payer: Medicare Other

## 2019-05-13 ENCOUNTER — Telehealth: Payer: Self-pay

## 2019-05-13 ENCOUNTER — Telehealth (INDEPENDENT_AMBULATORY_CARE_PROVIDER_SITE_OTHER): Payer: Medicare Other | Admitting: Family Medicine

## 2019-05-13 ENCOUNTER — Other Ambulatory Visit: Payer: Self-pay

## 2019-05-13 DIAGNOSIS — Z7185 Encounter for immunization safety counseling: Secondary | ICD-10-CM

## 2019-05-13 DIAGNOSIS — Z7189 Other specified counseling: Secondary | ICD-10-CM | POA: Diagnosis not present

## 2019-05-13 NOTE — Telephone Encounter (Signed)
Called patient and she stated that she needs to go to [facility] for her sister who passed. She wants to make sure that it will be okay to fly to go to the [employer]. Sept. 24- Sept. 27. She lives at [place]. Set up Doxy at 10:45am today  Copied from [place] [id number]. Topic: General - Other >> May 13, 2019  9:57 AM Keene Breath wrote: Reason for CRM: Patient called to request that the nurse call her because she has some questions for the doctor.  Would not give any further information.  CB# [phone]

## 2019-05-13 NOTE — Progress Notes (Signed)
This visit type was conducted due to national recommendations for restrictions regarding the COVID-19 pandemic in an effort to limit this patient's exposure and mitigate transmission in our community.   Virtual Visit via Telephone Note  I connected with Sara Logan on 05/13/19 at 10:45 AM EDT by telephone and verified that I am speaking with the correct person using two identifiers.   I discussed the limitations, risks, security and privacy concerns of performing an evaluation and management service by telephone and the availability of in person appointments. I also discussed with the patient that there may be a patient responsible charge related to this service. The patient expressed understanding and agreed to proceed.  Location patient: home Location provider: work or home office Participants present for the call: patient, provider Patient did not have a visit in the prior 7 days to address this/these issue(s).   History of Present Illness: Patient has chronic problems including history of rosacea, osteoarthritis, obesity, hyperlipidemia, hypertension.  She called basically for some counseling regarding COVID-19 risk.  She currently resides at [facility] and has been fairly isolated.  Her sister died back in the end of December 21, 2022 in [place] and they have a service for her September 26 in [place].  She has questions about flying.  She is currently asymptomatic.  She has a daughter-in-law who is trying to talk her out of flying.  She has no options of driving.  Patient also had questions about shingles vaccine.  She had previous Zostavax 12-21-2006.  She also has questions about timing of flu vaccine.   Observations/Objective: Patient sounds cheerful and well on the phone. I do not appreciate any SOB. Speech and thought processing are grossly intact. Patient reported vitals:  Assessment and Plan:  #1 COVID-19 counseling -Obviously, she cannot reduce risk 0 with flying but she states that she  cannot conceive not going to the service of her sister.  We discussed safety issues with mask use at all times and gloves and frequent handwashing, proper spacing from others, etc  and she is aware of these.  She is aware that she will very likely need to quarantine for 14 days when she returns to [place]  #2 immunization counseling -Recommend flu vaccine by early October when she gets back -We also discussed Shingrix vaccine which she will consider at pharmacy    Follow Up Instructions:  -as above.   99441 5-10 99442 11-20 99443 21-30 I did not refer this patient for an OV in the next 24 hours for this/these issue(s).  I discussed the assessment and treatment plan with the patient. The patient was provided an opportunity to ask questions and all were answered. The patient agreed with the plan and demonstrated an understanding of the instructions.   The patient was advised to call back or seek an in-person evaluation if the symptoms worsen or if the condition fails to improve as anticipated.  I provided 17 minutes of non-face-to-face time during this encounter.   Carolann Littler, MD

## 2019-05-21 ENCOUNTER — Other Ambulatory Visit: Payer: Self-pay | Admitting: Family Medicine

## 2019-05-26 ENCOUNTER — Other Ambulatory Visit: Payer: Self-pay | Admitting: Family Medicine

## 2019-05-26 NOTE — Telephone Encounter (Signed)
Medication Refill - Medication: estradiol (ESTRACE VAGINAL) 0.1 MG/GM vaginal cream    Has the patient contacted their pharmacy? Yes.   (Agent: If no, request that the patient contact the pharmacy for the refill.) (Agent: If yes, when and what did the pharmacy advise?)  Preferred Pharmacy (with phone number or street name):  [place], [place] [address]  Phone: [phone] Fax: [phone]     Agent: Please be advised that RX refills may take up to 3 business days. We ask that you follow-up with your pharmacy.

## 2019-05-26 NOTE — Telephone Encounter (Signed)
Requested medication (s) are due for refill today: yes  Requested medication (s) are on the active medication list: yes   Future visit scheduled: no  Notes to clinic:  Review for refill   Requested Prescriptions  Pending Prescriptions Disp Refills   estradiol (ESTRACE VAGINAL) 0.1 MG/GM vaginal cream 42.5 g 12    Sig: Place 1 Applicatorful vaginally 3 (three) times a week.     OB/GYN:  Estrogens Failed - 05/26/2019 10:44 AM      Failed - Mammogram is up-to-date per Health Maintenance      Passed - Last BP in normal range    BP Readings from Last 1 Encounters:  11/17/18 116/80         Passed - Valid encounter within last 12 months    Recent Outpatient Visits          1 week ago Advice Given About Covid-19 Virus by [employer] at [facility], Alinda Sierras, MD   6 months ago Carpal tunnel syndrome of left wrist   Therapist, music at [place], MD   7 months ago Acute pain of right knee   Therapist, music at [facility], Alinda Sierras, MD   10 months ago [employer] fast streamer at [facility], Alinda Sierras, MD   10 months ago Civil Service fast streamer at [facility], Alinda Sierras, MD

## 2019-05-29 MED ORDER — ESTRADIOL 0.1 MG/GM VA CREA: 1.0000 | TOPICAL_CREAM | VAGINAL | 12 refills | Status: DC

## 2019-05-29 NOTE — Telephone Encounter (Signed)
Pt called for an update on the refill request for estradiol (ESTRACE VAGINAL) 0.1 MG/GM vaginal cream. Pt requests call back

## 2019-05-29 NOTE — Telephone Encounter (Signed)
Refills OK. 

## 2019-05-29 NOTE — Telephone Encounter (Signed)
This was last filled in 2018, OK to continue to fill? Or does patient need a follow up? Please advise. Patient is going on a trip on Thursday, 06/04/19.

## 2019-06-11 ENCOUNTER — Other Ambulatory Visit: Payer: Self-pay | Admitting: Family Medicine

## 2019-06-13 ENCOUNTER — Ambulatory Visit (INDEPENDENT_AMBULATORY_CARE_PROVIDER_SITE_OTHER): Payer: Medicare Other

## 2019-06-13 ENCOUNTER — Other Ambulatory Visit: Payer: Self-pay

## 2019-06-13 DIAGNOSIS — Z23 Encounter for immunization: Secondary | ICD-10-CM | POA: Diagnosis not present

## 2019-06-18 ENCOUNTER — Ambulatory Visit
Admission: RE | Admit: 2019-06-18 | Discharge: 2019-06-18 | Disposition: A | Discharge status: Home / Self Care | Payer: Medicare Other | Source: Ambulatory Visit | Attending: Family Medicine | Admitting: Family Medicine

## 2019-06-18 ENCOUNTER — Other Ambulatory Visit: Payer: Self-pay

## 2019-06-18 ENCOUNTER — Other Ambulatory Visit: Payer: Self-pay | Admitting: Family Medicine

## 2019-06-18 DIAGNOSIS — Z1231 Encounter for screening mammogram for malignant neoplasm of breast: Secondary | ICD-10-CM | POA: Diagnosis not present

## 2019-07-20 ENCOUNTER — Other Ambulatory Visit: Payer: Self-pay

## 2019-07-21 ENCOUNTER — Encounter: Payer: Self-pay | Admitting: Family Medicine

## 2019-07-21 ENCOUNTER — Ambulatory Visit (INDEPENDENT_AMBULATORY_CARE_PROVIDER_SITE_OTHER): Payer: Medicare Other | Admitting: Family Medicine

## 2019-07-21 VITALS — BP 118/62 | HR 92 | Temp 98.0°F | Resp 16 | Ht 61.5 in | Wt 187.5 lb

## 2019-07-21 DIAGNOSIS — R300 Dysuria: Secondary | ICD-10-CM | POA: Diagnosis not present

## 2019-07-21 LAB — POCT URINALYSIS DIPSTICK
Bilirubin, UA: NEGATIVE
Glucose, UA: NEGATIVE
Ketones, UA: NEGATIVE
Nitrite, UA: NEGATIVE
Protein, UA: NEGATIVE
Spec Grav, UA: 1.02 (ref 1.010–1.025)
Urobilinogen, UA: 0.2 E.U./dL
pH, UA: 5.5 (ref 5.0–8.0)

## 2019-07-21 MED ORDER — CEPHALEXIN 500 MG PO CAPS: 500.0000 mg | ORAL_CAPSULE | Freq: Three times a day (TID) | ORAL | 0 refills | Status: DC

## 2019-07-21 NOTE — Patient Instructions (Signed)

## 2019-07-21 NOTE — Progress Notes (Signed)
  Subjective:     Patient ID: ANAYA ARNS, female   DOB: 1933-01-24, 85 y.o.   MRN: [id number]  HPI   Ms. Thornton Papas is seen with approximately 2-week history of severe frequency and burning with urination.  She does have history of atrophic vaginitis and has taken Estrace cream in the past but not recently because of cost.  She denies any fevers or chills.  No flank pain.  No nausea or vomiting.  No confusion.  Past history of allergy to Bactrim.  Past Medical History:  Diagnosis Date  . Arthritis   . Depression   . Hyperlipidemia   . Osteopenia   . Stroke ([place])   . Thrombocytopenia ([place])   . Vitamin D deficiency   . Wrist fracture 2002   left   Past Surgical History:  Procedure Laterality Date  . APPENDECTOMY  1967  . CARPAL TUNNEL RELEASE  2008   right  . JOINT REPLACEMENT  2002   right total     reports that she has never smoked. She has never used smokeless tobacco. She reports that she does not drink alcohol or use drugs. family history includes Breast cancer in her mother and sister. Allergies  Allergen Reactions  . Bactrim [Sulfamethoxazole-Trimethoprim] Nausea Only  . Ditropan [Oxybutynin]     Made patient feel bad    Review of Systems  Constitutional: Negative for appetite change, chills and fever.  Gastrointestinal: Negative for abdominal pain, constipation, diarrhea, nausea and vomiting.  Genitourinary: Positive for dysuria and frequency.  Musculoskeletal: Negative for back pain.  Neurological: Negative for dizziness.       Objective:   Physical Exam Constitutional:      Appearance: She is well-developed.  HENT:     Head: Normocephalic and atraumatic.  Neck:     Musculoskeletal: Neck supple.     Thyroid: No thyromegaly.  Cardiovascular:     Rate and Rhythm: Normal rate and regular rhythm.     Heart sounds: Normal heart sounds.  Pulmonary:     Breath sounds: Normal breath sounds.  Abdominal:     General: Bowel sounds are normal.     Palpations:  Abdomen is soft.     Tenderness: There is no abdominal tenderness.        Assessment:     Dysuria.  Suspect uncomplicated cystitis.  She has 3+ leukocytes and 1+ blood.  Nontoxic in appearance.    Plan:     -Urine culture sent -Stay well-hydrated -Start Keflex 500 mg 3 times daily for 7 days -Follow-up promptly for any fever or worsening or persistent symptoms  Eulas Post MD [facility] at [facility]

## 2019-07-23 LAB — URINE CULTURE
MICRO NUMBER:: 1084374
SPECIMEN QUALITY:: ADEQUATE

## 2019-07-24 ENCOUNTER — Other Ambulatory Visit: Payer: Self-pay | Admitting: Family Medicine

## 2019-08-04 ENCOUNTER — Other Ambulatory Visit: Payer: Self-pay

## 2019-08-16 ENCOUNTER — Other Ambulatory Visit: Payer: Self-pay | Admitting: Family Medicine

## 2019-09-12 ENCOUNTER — Other Ambulatory Visit: Payer: Self-pay | Admitting: Family Medicine

## 2019-09-14 DIAGNOSIS — Z23 Encounter for immunization: Secondary | ICD-10-CM | POA: Diagnosis not present

## 2019-09-19 ENCOUNTER — Other Ambulatory Visit: Payer: Self-pay | Admitting: Family Medicine

## 2019-09-23 ENCOUNTER — Other Ambulatory Visit: Payer: Self-pay

## 2019-09-23 ENCOUNTER — Ambulatory Visit: Payer: Medicare Other

## 2019-09-24 ENCOUNTER — Ambulatory Visit: Payer: Medicare Other

## 2019-09-29 ENCOUNTER — Telehealth: Payer: Self-pay

## 2019-09-29 ENCOUNTER — Telehealth (INDEPENDENT_AMBULATORY_CARE_PROVIDER_SITE_OTHER): Payer: Medicare Other | Admitting: Family Medicine

## 2019-09-29 ENCOUNTER — Other Ambulatory Visit: Payer: Self-pay

## 2019-09-29 DIAGNOSIS — R197 Diarrhea, unspecified: Secondary | ICD-10-CM

## 2019-09-29 NOTE — Progress Notes (Signed)
This visit type was conducted due to national recommendations for restrictions regarding the COVID-19 pandemic in an effort to limit this patient's exposure and mitigate transmission in our community.   Virtual Visit via Telephone Note  I connected with Sara Logan on 09/29/19 at 10:15 AM EST by telephone and verified that I am speaking with the correct person using two identifiers.   I discussed the limitations, risks, security and privacy concerns of performing an evaluation and management service by telephone and the availability of in person appointments. I also discussed with the patient that there may be a patient responsible charge related to this service. The patient expressed understanding and agreed to proceed.  Location patient: home Location provider: work or home office Participants present for the call: patient, provider Patient did not have a visit in the prior 7 days to address this/these issue(s).   History of Present Illness:  Sara Logan called with frequent loose stools.  She has had tendency to have these sometimes in the past.  She states she has about 3 loose but not watery stools per day.  She has had some urine incontinence and also has occasional minimal leakage with her bowel movements.  She thinks she has some sphincter competence issues.  She has not been on any recent antibiotics.  She has not had any nausea or vomiting.  No fever.  No abdominal pain.  No weight loss.  Good appetite.  She is taken Imodium occasionally which helps a little bit.  She had specific questions regarding whether she should be on a probiotic.  She is eating some low-fat yogurt.  She otherwise tends to avoid most milk products.  She also had questions regarding shingles vaccine.  She had previous Zostavax but not Shingrix.  Past Medical History:  Diagnosis Date  . Arthritis   . Depression   . Hyperlipidemia   . Osteopenia   . Stroke ([place])   . Thrombocytopenia ([place])   . Vitamin D  deficiency   . Wrist fracture 2002   left   Past Surgical History:  Procedure Laterality Date  . APPENDECTOMY  1967  . CARPAL TUNNEL RELEASE  2008   right  . JOINT REPLACEMENT  2002   right total     reports that she has never smoked. She has never used smokeless tobacco. She reports that she does not drink alcohol or use drugs. family history includes Breast cancer in her mother and sister. Allergies  Allergen Reactions  . Bactrim [Sulfamethoxazole-Trimethoprim] Nausea Only  . Ditropan [Oxybutynin]     Made patient feel bad      Observations/Objective: Patient sounds cheerful and well on the phone. I do not appreciate any SOB. Speech and thought processing are grossly intact. Patient reported vitals:  Assessment and Plan: #1 frequent loose stools.  She does not have any red flags such as fever, weight loss, bloody stools.  No recent antibiotic use  -Discussed potential fiber supplement such as Citrucel to see if this will bulk up her stools -She will try over-the-counter probiotics such as align -Touch base if symptoms not improving over the next couple of weeks -We also discussed dietary factors that could contribute to loose stools or diarrhea  #2 immunization questions.  She has had previous Zostavax but not Shingrix  -We strongly advised that she wait until she gets her second Covid vaccine and will need to wait at least 14 days before initiating shingles vaccine  Follow Up Instructions:  -Touch base in a  few weeks if above interventions not helping   99441 5-10 99442 11-20 99443 21-30 I did not refer this patient for an OV in the next 24 hours for this/these issue(s).  I discussed the assessment and treatment plan with the patient. The patient was provided an opportunity to ask questions and all were answered. The patient agreed with the plan and demonstrated an understanding of the instructions.   The patient was advised to call back or seek an in-person  evaluation if the symptoms worsen or if the condition fails to improve as anticipated.  I provided 23 minutes of non-face-to-face time during this encounter.   Carolann Littler, MD

## 2019-09-29 NOTE — Telephone Encounter (Signed)
Called and left a detailed voice message to get more information from this patient. I do not see that this patient has been screened for Covid symptoms and this could be a symptom.  Please screen for Covid symptoms.  OK for PEC to advise/discuss/ and change visit to a virtual visit or telephone visit  CRM Created.

## 2019-10-08 ENCOUNTER — Telehealth: Payer: Self-pay | Admitting: Family Medicine

## 2019-10-08 NOTE — Telephone Encounter (Signed)
Left message for patient to call back and schedule Medicare Annual Wellness Visit (AWV) either virtually,audio only or in person (whichever the patient prefers--45 MINUTES).  Last AWV 10.8.19; please schedule at anytime with [facility]-Nurse Health Advisor at [facility].

## 2019-10-12 DIAGNOSIS — Z23 Encounter for immunization: Secondary | ICD-10-CM | POA: Diagnosis not present

## 2019-10-16 ENCOUNTER — Other Ambulatory Visit: Payer: Self-pay | Admitting: Family Medicine

## 2019-10-16 ENCOUNTER — Ambulatory Visit (INDEPENDENT_AMBULATORY_CARE_PROVIDER_SITE_OTHER): Payer: Medicare Other

## 2019-10-16 ENCOUNTER — Telehealth: Payer: Self-pay | Admitting: *Deleted

## 2019-10-16 ENCOUNTER — Other Ambulatory Visit: Payer: Self-pay

## 2019-10-16 VITALS — BP 110/68 | Temp 97.9°F | Ht 62.0 in | Wt 180.8 lb

## 2019-10-16 DIAGNOSIS — Z0000 Encounter for general adult medical examination without abnormal findings: Secondary | ICD-10-CM

## 2019-10-16 NOTE — Progress Notes (Signed)
Subjective:   Sara Logan is a 86 y.o. female who presents for Medicare Annual (Subsequent) preventive examination.  [name] resides at [facility] in a senior independent apartment. She states she does not walk very much at all but has the space to do so indoors. She receives 2 meals per day that are prepared and prepares her own breakfast every day. She has received both moderna covid vaccines. She is sleeping 8 hours every night.   She does complain about loose stools with some control problems with her bowels. She has been taking align probiotics and states this has helped some but she is hopeful this will continue to improve.  Review of Systems:  No ROS: Annual Medicare Wellness subsequent visit  Cardiac Risk Factors include: advanced age (>35men, >7 women);sedentary lifestyle     Objective:     Vitals: BP 110/68 (BP Location: Left Arm, Patient Position: Sitting)   Temp 97.9 F (36.6 C)   Ht 5\' 2"  (1.575 m)   Wt 180 lb 12.8 oz (82 kg)   BMI 33.07 kg/m   Body mass index is 33.07 kg/m.  Advanced Directives 10/16/2019 06/17/2018 06/13/2017 06/07/2016 06/06/2016 09/25/2015 09/25/2015  Does Patient Have a Medical Advance Directive? Yes Yes Yes No No Yes Yes  Type of Paramedic of [place];Living will - - - - - -  Does patient want to make changes to medical advance directive? No - Patient declined - - - - - -  Copy of [place] in Chart? No - copy requested - - - - Yes No - copy requested  Would patient like information on creating a medical advance directive? - - - - No - patient declined information - -    Tobacco Social History   Tobacco Use  Smoking Status Never Smoker  Smokeless Tobacco Never Used     Counseling given: Not Answered   Clinical Intake:  Pre-visit preparation completed: Yes  Pain : No/denies pain   BMI - recorded: 33.07 Nutritional Status: BMI > 30  Obese Nutritional Risks: None Diabetes: No  How  often do you need to have someone help you when you read instructions, pamphlets, or other written materials from your doctor or pharmacy?: 1 - Never What is the last grade level you completed in school?: college degree  Interpreter Needed?: No  Information entered by :: Franne Forts, LPN.  Past Medical History:  Diagnosis Date  . Arthritis   . Depression   . Hyperlipidemia   . Osteopenia   . Stroke ([place])   . Thrombocytopenia ([place])   . Vitamin D deficiency   . Wrist fracture 2002   left   Past Surgical History:  Procedure Laterality Date  . APPENDECTOMY  1967  . CARPAL TUNNEL RELEASE  2008   right  . JOINT REPLACEMENT  2002   right total    Family History  Problem Relation Age of Onset  . Breast cancer Mother   . Breast cancer Sister    Social History   Socioeconomic History  . Marital status: Widowed    Spouse name: Not on file  . Number of children: 4  . Years of education: 16  . Highest education level: Bachelor's degree (e.g., BA, AB, BS)  Occupational History  . Not on file  Tobacco Use  . Smoking status: Never Smoker  . Smokeless tobacco: Never Used  Substance and Sexual Activity  . Alcohol use: No  . Drug use: No  .  Sexual activity: Not on file  Other Topics Concern  . Not on file  Social History Narrative   Lives in senior apartment at [facility]   widowed   Social Determinants of Health   Financial Resource Strain: [place]   . Difficulty of Paying Living Expenses: Not hard at all  Food Insecurity: No Food Insecurity  . Worried About Charity fundraiser in the Last Year: Never true  . Ran Out of Food in the Last Year: Never true  Transportation Needs: No Transportation Needs  . Lack of Transportation (Medical): No  . Lack of Transportation (Non-Medical): No  Physical Activity: Inactive  . Days of Exercise per Week: 0 days  . Minutes of Exercise per Session: 0 min  Stress: No Stress Concern Present  . Feeling of Stress : Only a  little  Social Connections: Unknown  . Frequency of Communication with Friends and Family: More than three times a week  . Frequency of Social Gatherings with Friends and Family: Once a week  . Attends Religious Services: Not on file  . Active Member of Clubs or Organizations: Yes  . Attends Archivist Meetings: Not on file  . Marital Status: Widowed    Outpatient Encounter Medications as of 10/16/2019  Medication Sig  . amLODipine (NORVASC) 5 MG tablet TAKE 1 TABLET ONCE DAILY.  Marland Kitchen aspirin EC 325 MG EC tablet Take 1 tablet (325 mg total) by mouth daily.  . Biotin 10 MG TABS Take 10 mg by mouth daily.   . Cholecalciferol (VITAMIN D3) 2000 units capsule 1 tablet daily.  Marland Kitchen estradiol (ESTRACE VAGINAL) 0.1 MG/GM vaginal cream Place 1 Applicatorful vaginally 3 (three) times a week.  . mirabegron ER (MYRBETRIQ) 25 MG TB24 tablet Take 1 tablet (25 mg total) by mouth daily.  . Misc Natural Products (APPLE CIDER VINEGAR DIET PO) Take 200 mg by mouth.  Marland Kitchen Specialty Vitamins Products (MAGNESIUM, AMINO ACID CHELATE,) 133 MG tablet Take 1 tablet by mouth daily.  . TURMERIC PO Take by mouth daily.  Marland Kitchen venlafaxine XR (EFFEXOR-XR) 75 MG 24 hr capsule TAKE 2 CAPSULES (150MG ) BY MOUTH DAILY.  . vitamin B-12 (CYANOCOBALAMIN) 1000 MCG tablet daily.  . vitamin C (ASCORBIC ACID) 500 MG tablet Take 500 mg by mouth daily.  . vitamin E 100 UNIT capsule Take 100 Units by mouth daily.  . Zinc Sulfate (ZINC 15 PO) Take by mouth.  . [DISCONTINUED] venlafaxine XR (EFFEXOR-XR) 75 MG 24 hr capsule TAKE 2 CAPSULES (150MG ) BY MOUTH DAILY.   No facility-administered encounter medications on file as of 10/16/2019.    Activities of Daily Living In your present state of health, do you have any difficulty performing the following activities: 10/16/2019  Hearing? N  Vision? N  Difficulty concentrating or making decisions? N  Walking or climbing stairs? N  Dressing or bathing? N  Doing errands, shopping? N  Preparing  Food and eating ? N  Using the Toilet? N  In the past six months, have you accidently leaked urine? Y  Do you have problems with loss of bowel control? Y  Managing your Medications? N  Managing your Finances? N  Housekeeping or managing your Housekeeping? Y  Some recent data might be hidden    Patient Care Team: Eulas Post, MD as PCP - General    Assessment:   This is a routine wellness examination for Anessia.  Exercise Activities and Dietary recommendations Current Exercise Habits: The patient does not participate in regular  exercise at present, Exercise limited by: None identified  Goals    . DIET - INCREASE WATER INTAKE    . Exercise 150 minutes per week (moderate activity)     Exercise Will do more walking; can walk indoors or outdoors. Once a day; 30 minutes        Fall Risk Fall Risk  10/16/2019 08/04/2019 07/31/2018 06/17/2018 06/13/2017  Falls in the past year? 0 0 0 No No  Comment - Emmi Telephone Survey: data to providers prior to load Emmi Telephone Survey: data to providers prior to load - -  Number falls in past yr: - - - - -  Risk for fall due to : Medication side effect - - - -  Risk for fall due to: Comment - - - - -  Follow up Falls evaluation completed;Education provided;Falls prevention discussed - - - -   Is the patient's home free of loose throw rugs in walkways, pet beds, electrical cords, etc?   yes      Grab bars in the bathroom? yes      Handrails on the stairs?   yes      Adequate lighting?   yes  Timed Get Up and Go performed: normal  Depression Screen PHQ 2/9 Scores 10/16/2019 07/18/2018 06/17/2018 06/13/2017  PHQ - 2 Score 2 0 1 0  PHQ- 9 Score 3 0 - -     Cognitive Function MMSE - Mini Mental State Exam 06/17/2018 06/13/2017 06/08/2016  Not completed: (No Data) (No Data) (No Data)        Immunization History  Administered Date(s) Administered  . Fluad Quad(high Dose 65+) 06/13/2019  . Influenza Split 06/18/2012  . Influenza Whole  07/11/2010  . Influenza, High Dose Seasonal PF 07/07/2013, 07/14/2014, 06/24/2015, 06/13/2016, 06/04/2017, 06/17/2018  . Moderna SARS-COVID-2 Vaccination 09/14/2019, 10/12/2019  . Pneumococcal Conjugate-13 04/27/2014  . Pneumococcal Polysaccharide-23 10/15/2017  . Td 09/10/2005  . Zoster 11/29/2006    Qualifies for Shingles Vaccine?yes  Screening Tests Health Maintenance  Topic Date Due  . TETANUS/TDAP  09/11/2015  . INFLUENZA VACCINE  Completed  . DEXA SCAN  Completed  . PNA vac Low Risk Adult  Completed    Cancer Screenings: Lung: Low Dose CT Chest recommended if Age 25-80 years, 30 pack-year currently smoking OR have quit w/in 15years. Patient does not qualify. Breast:  Up to date on Mammogram? Yes ; aged out  Up to date of Bone Density/Dexa? Yes; aged out Colorectal: yes; aged out.  Additional Screenings:  Hepatitis C Screening: N/A    Plan:   Ms. Pangelinan was informed about need for updated Tdap and shingrix vaccines but may be difficult since those are not covered by Medicare. Encouraged her to increase water intake and physical activity by walking inside the building.   I have personally reviewed and noted the following in the patient's chart:   . Medical and social history . Use of alcohol, tobacco or illicit drugs  . Current medications and supplements . Functional ability and status . Nutritional status . Physical activity . Advanced directives . List of other physicians . Hospitalizations, surgeries, and ER visits in previous 12 months . Vitals . Screenings to include cognitive, depression, and falls . Referrals and appointments  In addition, I have reviewed and discussed with patient certain preventive protocols, quality metrics, and best practice recommendations. A written personalized care plan for preventive services as well as general preventive health recommendations were provided to patient.     Franne Forts,  LPN  [phone]

## 2019-10-16 NOTE — Telephone Encounter (Signed)
-----   Message from Christiane Ha, LPN sent at [phone] 12:04 PM EST ----- Regarding: new rx for effexor Did her AWV today and she needs a new rx for Effexor. She said she's not getting a 30 day supply. She takes 2 po daily.

## 2019-10-16 NOTE — Telephone Encounter (Signed)
Refill sent.

## 2019-10-21 ENCOUNTER — Other Ambulatory Visit: Payer: Self-pay

## 2019-10-21 ENCOUNTER — Telehealth (INDEPENDENT_AMBULATORY_CARE_PROVIDER_SITE_OTHER): Payer: Medicare Other | Admitting: Family Medicine

## 2019-10-21 DIAGNOSIS — R195 Other fecal abnormalities: Secondary | ICD-10-CM | POA: Diagnosis not present

## 2019-10-21 NOTE — Progress Notes (Signed)
This visit type was conducted due to national recommendations for restrictions regarding the COVID-19 pandemic in an effort to limit this patient's exposure and mitigate transmission in our community.    Virtual Visit via Telephone Note  I connected with Sara Logan on 10/21/19 at  9:30 AM EST by telephone and verified that I am speaking with the correct person using two identifiers.   I discussed the limitations, risks, security and privacy concerns of performing an evaluation and management service by telephone and the availability of in person appointments. I also discussed with the patient that there may be a patient responsible charge related to this service. The patient expressed understanding and agreed to proceed.  Location patient: home Location provider: work or home office Participants present for the call: patient, provider Patient did not have a visit in the prior 7 days to address this/these issue(s).   History of Present Illness: Sara Logan called to discuss something we had discussed previously and that is frequent loose stools.  She states she is not having any diarrhea.  No abdominal pain or cramping.  We had previously suggested that she try some fiber supplement such as Citrucel or Metamucil but she has not yet started that.  She is taking some align.  She has noted that when she eats things that are high in sugar her symptoms seem worse.  She has some occasional leakage of loose but nonwatery stool.  No bloody stools.  No weight loss.  Good appetite.  No history of lactose intolerance.  Past Medical History:  Diagnosis Date  . Arthritis   . Depression   . Hyperlipidemia   . Osteopenia   . Stroke ([place])   . Thrombocytopenia ([place])   . Vitamin D deficiency   . Wrist fracture 2002   left   Past Surgical History:  Procedure Laterality Date  . APPENDECTOMY  1967  . CARPAL TUNNEL RELEASE  2008   right  . JOINT REPLACEMENT  2002   right total     reports that she  has never smoked. She has never used smokeless tobacco. She reports that she does not drink alcohol or use drugs. family history includes Breast cancer in her mother and sister. Allergies  Allergen Reactions  . Bactrim [Sulfamethoxazole-Trimethoprim] Nausea Only  . Ditropan [Oxybutynin]     Made patient feel bad      Observations/Objective: Patient sounds cheerful and well on the phone. I do not appreciate any SOB. Speech and thought processing are grossly intact. Patient reported vitals:  Assessment and Plan:  Frequent loose stools.  Occasional stool incontinence.  Doubt IBS as she has had no history of this previously and no history of lactose intolerance.  -Recommend she add daily fiber supplement such as Citrucel, FiberCon, or Metamucil to see if this will help bulk up her stools -Avoid triggering foods such as high glycemic foods or high fatty foods -Follow-up promptly for any red flag symptoms such as fever, abdominal pain, bloody stools  Follow Up Instructions:  -As above   99441 5-10 99442 11-20 99443 21-30 I did not refer this patient for an OV in the next 24 hours for this/these issue(s).  I discussed the assessment and treatment plan with the patient. The patient was provided an opportunity to ask questions and all were answered. The patient agreed with the plan and demonstrated an understanding of the instructions.   The patient was advised to call back or seek an in-person evaluation if the symptoms worsen or  if the condition fails to improve as anticipated.  I provided 18 minutes of non-face-to-face time during this encounter.   Sara Littler, MD

## 2019-11-17 ENCOUNTER — Other Ambulatory Visit: Payer: Self-pay | Admitting: Family Medicine

## 2019-11-23 DIAGNOSIS — H35033 Hypertensive retinopathy, bilateral: Secondary | ICD-10-CM | POA: Diagnosis not present

## 2019-11-23 DIAGNOSIS — H5211 Myopia, right eye: Secondary | ICD-10-CM | POA: Diagnosis not present

## 2019-11-23 DIAGNOSIS — H268 Other specified cataract: Secondary | ICD-10-CM | POA: Diagnosis not present

## 2019-11-23 DIAGNOSIS — H524 Presbyopia: Secondary | ICD-10-CM | POA: Diagnosis not present

## 2019-11-23 DIAGNOSIS — Z961 Presence of intraocular lens: Secondary | ICD-10-CM | POA: Diagnosis not present

## 2019-11-23 DIAGNOSIS — H52201 Unspecified astigmatism, right eye: Secondary | ICD-10-CM | POA: Diagnosis not present

## 2019-12-07 ENCOUNTER — Other Ambulatory Visit: Payer: Self-pay | Admitting: Family Medicine

## 2019-12-17 ENCOUNTER — Other Ambulatory Visit: Payer: Self-pay | Admitting: Family Medicine

## 2019-12-23 ENCOUNTER — Ambulatory Visit: Payer: Medicare Other | Admitting: Family Medicine

## 2019-12-30 ENCOUNTER — Other Ambulatory Visit: Payer: Self-pay

## 2019-12-30 ENCOUNTER — Encounter: Payer: Self-pay | Admitting: Family Medicine

## 2019-12-30 ENCOUNTER — Ambulatory Visit (INDEPENDENT_AMBULATORY_CARE_PROVIDER_SITE_OTHER): Payer: Medicare Other | Admitting: Family Medicine

## 2019-12-30 VITALS — BP 118/68 | HR 95 | Temp 98.1°F | Wt 179.9 lb

## 2019-12-30 DIAGNOSIS — R490 Dysphonia: Secondary | ICD-10-CM

## 2019-12-30 DIAGNOSIS — Z8679 Personal history of other diseases of the circulatory system: Secondary | ICD-10-CM | POA: Diagnosis not present

## 2019-12-30 DIAGNOSIS — R0982 Postnasal drip: Secondary | ICD-10-CM

## 2019-12-30 DIAGNOSIS — E785 Hyperlipidemia, unspecified: Secondary | ICD-10-CM

## 2019-12-30 DIAGNOSIS — I10 Essential (primary) hypertension: Secondary | ICD-10-CM | POA: Diagnosis not present

## 2019-12-30 DIAGNOSIS — R197 Diarrhea, unspecified: Secondary | ICD-10-CM | POA: Diagnosis not present

## 2019-12-30 LAB — HEPATIC FUNCTION PANEL
ALT: 10 U/L (ref 0–35)
AST: 12 U/L (ref 0–37)
Albumin: 3.8 g/dL (ref 3.5–5.2)
Alkaline Phosphatase: 73 U/L (ref 39–117)
Bilirubin, Direct: 0.1 mg/dL (ref 0.0–0.3)
Total Bilirubin: 0.5 mg/dL (ref 0.2–1.2)
Total Protein: 7.1 g/dL (ref 6.0–8.3)

## 2019-12-30 LAB — BASIC METABOLIC PANEL
BUN: 22 mg/dL (ref 6–23)
CO2: 30 mEq/L (ref 19–32)
Calcium: 9.5 mg/dL (ref 8.4–10.5)
Chloride: 100 mEq/L (ref 96–112)
Creatinine, Ser: 0.69 mg/dL (ref 0.40–1.20)
GFR: 80.81 mL/min (ref >60.00)
Glucose, Bld: 89 mg/dL (ref 70–99)
Potassium: 4.2 mEq/L (ref 3.5–5.1)
Sodium: 137 mEq/L (ref 135–145)

## 2019-12-30 LAB — LIPID PANEL
Cholesterol: 180 mg/dL (ref 0–200)
HDL: 43.2 mg/dL (ref >39.00)
LDL Cholesterol: 106 mg/dL — ABNORMAL HIGH (ref 0–99)
NonHDL: 136.67
Total CHOL/HDL Ratio: 4
Triglycerides: 151 mg/dL — ABNORMAL HIGH (ref 0.0–149.0)
VLDL: 30.2 mg/dL (ref 0.0–40.0)

## 2019-12-30 NOTE — Progress Notes (Signed)
Subjective:     Patient ID: Sara Logan, female   DOB: December 17, 1932, 86 y.o.   MRN: [id number]  HPI Sara Logan is seen for the following items:  She had frequent loose stools.  She tried fiber supplement but did not make much difference.  She then tried some over-the-counter Align and she thinks that has helped.  She had decreased frequency and severity of loose stools.  No bloody stools.  She has lost about 8 pounds and she states due to her efforts.  Still has good appetite.  No abdominal pain.  She has past history of cerebrovascular disease.  She has hypertension history.  We went over her meds and she did not seem to have recognition of amlodipine.  She is not sure she is actually taking this.  She will check.  She not had any recent lipids.  Her last LDL cholesterol 75.  She remains on aspirin.  She relates hoarseness for about the past 3 to 4 weeks.  No GERD symptoms.  She thinks she may be having some postnasal drip related to the pollen.  She apparently had similar problem in the past.  She has had what sounds like nasolaryngoscopy years ago.  Non-smoker.  Past Medical History:  Diagnosis Date  . Arthritis   . Depression   . Hyperlipidemia   . Osteopenia   . Stroke ([place])   . Thrombocytopenia ([place])   . Vitamin D deficiency   . Wrist fracture 2002   left   Past Surgical History:  Procedure Laterality Date  . APPENDECTOMY  1967  . CARPAL TUNNEL RELEASE  2008   right  . JOINT REPLACEMENT  2002   right total     reports that she has never smoked. She has never used smokeless tobacco. She reports that she does not drink alcohol or use drugs. family history includes Breast cancer in her mother and sister. Allergies  Allergen Reactions  . Bactrim [Sulfamethoxazole-Trimethoprim] Nausea Only  . Ditropan [Oxybutynin]     Made patient feel bad     Review of Systems  Constitutional: Negative for fatigue, fever and unexpected weight change.  HENT: Positive for postnasal drip  and voice change. Negative for sore throat.   Eyes: Negative for visual disturbance.  Respiratory: Negative for cough, chest tightness, shortness of breath and wheezing.   Cardiovascular: Negative for chest pain, palpitations and leg swelling.  Gastrointestinal: Negative for abdominal pain.  Endocrine: Negative for polydipsia and polyuria.  Genitourinary: Negative for dysuria.  Musculoskeletal: Negative for back pain.  Neurological: Negative for dizziness, seizures, syncope, weakness, light-headedness and headaches.  Hematological: Negative for adenopathy.       Objective:   Physical Exam Vitals reviewed.  Constitutional:      Appearance: Normal appearance.  Cardiovascular:     Rate and Rhythm: Normal rate and regular rhythm.  Pulmonary:     Effort: Pulmonary effort is normal.     Breath sounds: Normal breath sounds.  Musculoskeletal:     Cervical back: Neck supple.     Right lower leg: No edema.     Left lower leg: No edema.  Lymphadenopathy:     Cervical: No cervical adenopathy.  Neurological:     Mental Status: She is alert.        Assessment:     #1 hypertension stable.  Not clear whether she is taking amlodipine and we have asked that she is confirm this with her home medications  #2 past history of cerebrovascular disease.  Patient on aspirin.  No recent lipids.  Goal LDL less than 70  #3 hoarseness.  Question postnasal drip related  #4 history of frequent loose stools improved with over-the-counter probiotic    Plan:     -Continue with over-the-counter use of align  -Confirm whether she is taking amlodipine.  If she has not been taking this will leave off at this point since her blood pressure very well controlled today.  -Follow-up labs with lipid panel, hepatic panel, basic metabolic panel.  -Recommend over-the-counter Flonase once daily and if hoarseness not improved in a couple weeks be in touch and would consider ENT referral  -Routine follow-up in 6  months and sooner as needed  Sara Post MD [facility]

## 2019-12-30 NOTE — Patient Instructions (Signed)
Consider OTC Flonase once daily  Let me know in 1-2 weeks if hoarseness not improved  Confirm if you are taking the Amlodipine.

## 2019-12-31 ENCOUNTER — Telehealth: Payer: Self-pay | Admitting: Family Medicine

## 2019-12-31 NOTE — Telephone Encounter (Signed)
She needs to let Dr. Elease Hashimoto know that she is taking amLODipine (NORVASC) 5 MG tablet She wasn't for sure if she was or not but she is so she just wanted to let Dr. Elease Hashimoto know that information.

## 2020-01-01 ENCOUNTER — Other Ambulatory Visit: Payer: Self-pay

## 2020-01-01 DIAGNOSIS — E785 Hyperlipidemia, unspecified: Secondary | ICD-10-CM

## 2020-01-01 MED ORDER — ROSUVASTATIN CALCIUM 10 MG PO TABS: 10.0000 mg | ORAL_TABLET | Freq: Every day | ORAL | 0 refills | Status: DC

## 2020-01-01 NOTE — Telephone Encounter (Signed)
fyi

## 2020-01-13 ENCOUNTER — Telehealth (INDEPENDENT_AMBULATORY_CARE_PROVIDER_SITE_OTHER): Payer: Medicare Other | Admitting: Family Medicine

## 2020-01-13 ENCOUNTER — Other Ambulatory Visit: Payer: Self-pay

## 2020-01-13 DIAGNOSIS — R197 Diarrhea, unspecified: Secondary | ICD-10-CM

## 2020-01-13 DIAGNOSIS — E785 Hyperlipidemia, unspecified: Secondary | ICD-10-CM

## 2020-01-13 DIAGNOSIS — I10 Essential (primary) hypertension: Secondary | ICD-10-CM

## 2020-01-13 NOTE — Progress Notes (Signed)
Patient ID: Sara Logan, female   DOB: [birth year], 86 y.o.   MRN: [id number]  This visit type was conducted due to national recommendations for restrictions regarding the COVID-19 pandemic in an effort to limit this patient's exposure and mitigate transmission in our community.   Virtual Visit via Telephone Note  I connected with Sara Logan on 01/13/20 at 10:15 AM EDT by telephone and verified that I am speaking with the correct person using two identifiers.   I discussed the limitations, risks, security and privacy concerns of performing an evaluation and management service by telephone and the availability of in person appointments. I also discussed with the patient that there may be a patient responsible charge related to this service. The patient expressed understanding and agreed to proceed.  Location patient: home Location provider: work or home office Participants present for the call: patient, provider Patient did not have a visit in the prior 7 days to address this/these issue(s).   History of Present Illness:  Sara Logan called to discuss the following items.  She has history of hypertension, aborted left brain stroke 2017, history of rosacea, history of recurrent depression, hyperlipidemia  She had question regarding recent labs.  Her LDL cholesterol is 106.  Goal should be less than 70 with prior history of cerebrovascular disease.  Her blood pressures been well controlled.  She has no history of diabetes.  Non-smoker.  We recommended Crestor 10 mg daily which she has started.  She has no adverse side effects.  No myalgias.  She has future labs with lipid and hepatic in early June.  She had TPA with her aborted stroke back in 2017.  No recent new neurologic symptoms.  Hospital notes and tests that were done during her January 2017 admission were reviewed  She has hypertension which is well controlled.  No recent dizziness.  She remains on amlodipine.  Also taking aspirin 1  daily.  She has longstanding history of intermittent loose stools and occasional diarrhea.  She states 1 week ago she was eating lunch and had some peach cobbler with ice cream.  After about 24 hours she had some diarrhea which was very transient and then resolved.  Then on Friday she had small portion of cake and again about 24 hours later had some diarrhea.  She is not aware of any correlation with lactose intolerance.  She took 1 Imodium and has had no symptoms since then.  She is concerned that how sugar load is causing her symptoms.  She has not had any previous gastric surgery. Stools are normal at this time  Past Medical History:  Diagnosis Date  . Arthritis   . Depression   . Hyperlipidemia   . Osteopenia   . Stroke ([place])   . Thrombocytopenia ([place])   . Vitamin D deficiency   . Wrist fracture 2002   left   Past Surgical History:  Procedure Laterality Date  . APPENDECTOMY  1967  . CARPAL TUNNEL RELEASE  2008   right  . JOINT REPLACEMENT  2002   right total     reports that she has never smoked. She has never used smokeless tobacco. She reports that she does not drink alcohol or use drugs. family history includes Breast cancer in her mother and sister. Allergies  Allergen Reactions  . Bactrim [Sulfamethoxazole-Trimethoprim] Nausea Only  . Ditropan [Oxybutynin]     Made patient feel bad      Observations/Objective: Patient sounds cheerful and well on the phone. I  do not appreciate any SOB. Speech and thought processing are grossly intact. Patient reported vitals:  Assessment and Plan:  #1 dyslipidemia.  Prior history of cerebrovascular disease.  Goal LDL less than 70.  Recent LDL 106.  Recent initiation of Crestor  -Continue Crestor 10 mg daily. -Recheck fasting lipid and hepatic panel in early June.  Labs have already been scheduled  #2 hypertension stable by recent blood pressure readings -Continue amlodipine 5 mg daily  #3 intermittent diarrhea.  Her symptoms  do not really fit time course wise for likely dumping syndrome but question whether she is having some element of a hyper osmotic type response to high glucose loads.  She is not seeing a clear correlation with lactose.  She has been able to tolerate things like yogurt without difficulty  -She will try to reduce high glycemic food consumptions and avoid desserts for now. -Fortunately her symptoms are very short-lived with each episode  Follow Up Instructions:  -Labs scheduled for June 2   99441 5-10 99442 11-20 99443 21-30 I did not refer this patient for an OV in the next 24 hours for this/these issue(s).  I discussed the assessment and treatment plan with the patient. The patient was provided an opportunity to ask questions and all were answered. The patient agreed with the plan and demonstrated an understanding of the instructions.   The patient was advised to call back or seek an in-person evaluation if the symptoms worsen or if the condition fails to improve as anticipated.  I provided 27 minutes of non-face-to-face time during this encounter.   Carolann Littler, MD

## 2020-01-18 ENCOUNTER — Other Ambulatory Visit: Payer: Self-pay | Admitting: Family Medicine

## 2020-01-18 ENCOUNTER — Other Ambulatory Visit: Payer: Self-pay

## 2020-01-19 ENCOUNTER — Encounter: Payer: Self-pay | Admitting: Family Medicine

## 2020-01-19 ENCOUNTER — Ambulatory Visit (INDEPENDENT_AMBULATORY_CARE_PROVIDER_SITE_OTHER): Payer: Medicare Other | Admitting: Family Medicine

## 2020-01-19 VITALS — BP 122/66 | HR 95 | Temp 97.9°F | Wt 178.5 lb

## 2020-01-19 DIAGNOSIS — R410 Disorientation, unspecified: Secondary | ICD-10-CM

## 2020-01-19 DIAGNOSIS — E785 Hyperlipidemia, unspecified: Secondary | ICD-10-CM | POA: Diagnosis not present

## 2020-01-19 NOTE — Progress Notes (Signed)
Subjective:     Patient ID: Sara Logan, female   DOB: [birth year], 86 y.o.   MRN: [id number]  HPI  Sara Logan is seen to discuss the following issues  She states last Wednesday she had 3 teeth pulled.  To her left upper gum and one was right upper.  Apparently there was some difficulty getting the teeth out.  She did not have any general sedation.  She had taken amoxicillin beforehand as premedication and took some ibuprofen afterwards.  She states that Thursday morning when she woke up she was somewhat confused feeling.  She again denied any pain medications other than plain Tylenol and 1 dose of ibuprofen.  Her symptoms seem to gradually clear through the day Thursday.  She denied any slurred speech.  There is no focal weakness.  No facial droop.  No visual changes.  Denies any recent dysuria.  No fevers or chills.  She seems back to baseline at this time.  She does have past history of CVA.  She remains on aspirin 325 mg daily.  No history of diabetes.  Blood pressures been well controlled  Recent lipids revealed LDL cholesterol 106.  Goal less than 70.  We started Crestor 10 mg daily and she is tolerating without side effects.  Future labs have been ordered to reassess  Past Medical History:  Diagnosis Date  . Arthritis   . Depression   . Hyperlipidemia   . Osteopenia   . Stroke ([place])   . Thrombocytopenia ([place])   . Vitamin D deficiency   . Wrist fracture 2002   left   Past Surgical History:  Procedure Laterality Date  . APPENDECTOMY  1967  . CARPAL TUNNEL RELEASE  2008   right  . JOINT REPLACEMENT  2002   right total     reports that she has never smoked. She has never used smokeless tobacco. She reports that she does not drink alcohol or use drugs. family history includes Breast cancer in her mother and sister. Allergies  Allergen Reactions  . Bactrim [Sulfamethoxazole-Trimethoprim] Nausea Only  . Ditropan [Oxybutynin]     Made patient feel bad    Review of Systems   Constitutional: Negative for chills and fever.  Eyes: Negative for visual disturbance.  Respiratory: Negative for cough and shortness of breath.   Cardiovascular: Negative for chest pain, palpitations and leg swelling.  Gastrointestinal: Negative for abdominal pain, nausea and vomiting.  Genitourinary: Negative for dysuria.  Neurological: Negative for dizziness, seizures, speech difficulty and headaches.  Psychiatric/Behavioral:       See HPI       Objective:   Physical Exam Vitals reviewed.  Constitutional:      Appearance: Normal appearance.  HENT:     Head:     Comments: She has some mild facial swelling on the left side from recent dental procedure and some mild bruising. Eyes:     Extraocular Movements: Extraocular movements intact.  Cardiovascular:     Rate and Rhythm: Normal rate and regular rhythm.  Pulmonary:     Effort: Pulmonary effort is normal.     Breath sounds: Normal breath sounds.  Neurological:     General: No focal deficit present.     Mental Status: She is alert.     Comments: No focal weakness.  Gait normal.  No facial droop.  Cranial nerves III through XII intact.        Assessment:     #1 recent transient mild confusion following dental procedure.  She did not take any opioids after the procedure.  She did not describe any focal neurologic symptoms.  We discussed the fact this could have been cerebrovascular event.  She did not have any fever or other symptoms to suggest infectious source.  She is completely back to baseline at this time  #2 dyslipidemia.  Recent initiation of Crestor.  Goal LDL less than 70    Plan:     -Future labs have been placed with lipid and hepatic panel for June  -Follow-up immediately for any recurrent confusion, slurred speech, focal weakness, visual change, or any other concerns  -Continue aspirin 325 mg daily  Eulas Post MD [facility]

## 2020-01-19 NOTE — Patient Instructions (Signed)
Follow up for any recurrent confusion or other concerns.

## 2020-01-22 ENCOUNTER — Telehealth: Payer: Self-pay | Admitting: Family Medicine

## 2020-01-22 NOTE — Telephone Encounter (Signed)
Pt just wanted to make you aware and give you an update, seen dentist on yesterday and she have an infection in the area of the extraction and is being treated with medication from the dentist.

## 2020-01-22 NOTE — Telephone Encounter (Signed)
fyi

## 2020-01-29 ENCOUNTER — Telehealth (INDEPENDENT_AMBULATORY_CARE_PROVIDER_SITE_OTHER): Payer: Medicare Other | Admitting: Family Medicine

## 2020-01-29 ENCOUNTER — Telehealth: Payer: Medicare Other | Admitting: Family Medicine

## 2020-01-29 DIAGNOSIS — E785 Hyperlipidemia, unspecified: Secondary | ICD-10-CM

## 2020-01-29 DIAGNOSIS — R5383 Other fatigue: Secondary | ICD-10-CM

## 2020-01-29 NOTE — Progress Notes (Signed)
Patient ID: Sara Logan, female   DOB: 1933-07-05, 86 y.o.   MRN: [id number]  This visit type was conducted due to national recommendations for restrictions regarding the COVID-19 pandemic in an effort to limit this patient's exposure and mitigate transmission in our community.   Virtual Visit via Telephone Note  I connected with Sara Logan on 01/29/20 at  3:00 PM EDT by telephone and verified that I am speaking with the correct person using two identifiers.   I discussed the limitations, risks, security and privacy concerns of performing an evaluation and management service by telephone and the availability of in person appointments. I also discussed with the patient that there may be a patient responsible charge related to this service. The patient expressed understanding and agreed to proceed.  Location patient: home Location provider: work or home office Participants present for the call: patient, provider Patient did not have a visit in the prior 7 days to address this/these issue(s).   History of Present Illness: Sara Logan had called with some fatigue and she was concerned this was related to Crestor.  She has past history of CVA and goal LDL less than 70.  She had recent LDL cholesterol 106.  We had recommended starting Crestor 10 mg daily and she did take that for several days.  She has not had any myalgias or significant arthralgias but really more just fatigue on Monday.  She had recent dental procedure and has had some fatigue issues since then.  Denies any fever.  No chest pain.  She has at baseline fairly interrupted sleep but no change in sleep patterns.  She called today to discuss whether she should stay on Crestor.  Past Medical History:  Diagnosis Date  . Arthritis   . Depression   . Hyperlipidemia   . Osteopenia   . Stroke ([place])   . Thrombocytopenia ([place])   . Vitamin D deficiency   . Wrist fracture 2002   left   Past Surgical History:  Procedure Laterality Date   . APPENDECTOMY  1967  . CARPAL TUNNEL RELEASE  2008   right  . JOINT REPLACEMENT  2002   right total     reports that she has never smoked. She has never used smokeless tobacco. She reports that she does not drink alcohol or use drugs. family history includes Breast cancer in her mother and sister. Allergies  Allergen Reactions  . Bactrim [Sulfamethoxazole-Trimethoprim] Nausea Only  . Ditropan [Oxybutynin]     Made patient feel bad      Observations/Objective: Patient sounds cheerful and well on the phone. I do not appreciate any SOB. Speech and thought processing are grossly intact. Patient reported vitals:  Assessment and Plan:  #1 hyperlipidemia with goal LDL less than 70 based on prior history of CVA  #2 recent fatigue issues.  Patient queries whether this is related to Crestor.  In the absence of any myalgia or arthralgia we explained that we are not sure this explains her fatigue  -We recommended 1 more trial of Crestor 10 mg daily and if she experiences excessive fatigue again we will look at other alternatives  Follow Up Instructions:  -As above   99441 5-10 99442 11-20 99443 21-30 I did not refer this patient for an OV in the next 24 hours for this/these issue(s).  I discussed the assessment and treatment plan with the patient. The patient was provided an opportunity to ask questions and all were answered. The patient agreed with the plan  and demonstrated an understanding of the instructions.   The patient was advised to call back or seek an in-person evaluation if the symptoms worsen or if the condition fails to improve as anticipated.  I provided 25 minutes of non-face-to-face time during this encounter.   Carolann Littler, MD

## 2020-02-03 ENCOUNTER — Telehealth: Payer: Self-pay | Admitting: Family Medicine

## 2020-02-03 NOTE — Telephone Encounter (Signed)
Continued rosuvastatin (CRESTOR) 10 MG tablet it seems to be working.

## 2020-02-03 NOTE — Telephone Encounter (Signed)
noted 

## 2020-02-09 ENCOUNTER — Other Ambulatory Visit: Payer: Self-pay

## 2020-02-10 ENCOUNTER — Other Ambulatory Visit (INDEPENDENT_AMBULATORY_CARE_PROVIDER_SITE_OTHER): Payer: Medicare Other

## 2020-02-10 DIAGNOSIS — E785 Hyperlipidemia, unspecified: Secondary | ICD-10-CM

## 2020-02-10 LAB — HEPATIC FUNCTION PANEL
ALT: 11 U/L (ref 0–35)
AST: 16 U/L (ref 0–37)
Albumin: 3.9 g/dL (ref 3.5–5.2)
Alkaline Phosphatase: 72 U/L (ref 39–117)
Bilirubin, Direct: 0.1 mg/dL (ref 0.0–0.3)
Total Bilirubin: 0.5 mg/dL (ref 0.2–1.2)
Total Protein: 7.2 g/dL (ref 6.0–8.3)

## 2020-02-10 LAB — LIPID PANEL
Cholesterol: 136 mg/dL (ref 0–200)
HDL: 42.6 mg/dL (ref >39.00)
LDL Cholesterol: 72 mg/dL (ref 0–99)
NonHDL: 93.54
Total CHOL/HDL Ratio: 3
Triglycerides: 107 mg/dL (ref 0.0–149.0)
VLDL: 21.4 mg/dL (ref 0.0–40.0)

## 2020-02-16 ENCOUNTER — Other Ambulatory Visit: Payer: Self-pay | Admitting: Family Medicine

## 2020-03-15 DIAGNOSIS — R1310 Dysphagia, unspecified: Secondary | ICD-10-CM | POA: Diagnosis not present

## 2020-03-15 DIAGNOSIS — R0600 Dyspnea, unspecified: Secondary | ICD-10-CM | POA: Diagnosis not present

## 2020-03-15 DIAGNOSIS — R1314 Dysphagia, pharyngoesophageal phase: Secondary | ICD-10-CM | POA: Diagnosis not present

## 2020-03-15 DIAGNOSIS — N3946 Mixed incontinence: Secondary | ICD-10-CM | POA: Diagnosis not present

## 2020-03-15 DIAGNOSIS — R2681 Unsteadiness on feet: Secondary | ICD-10-CM | POA: Diagnosis not present

## 2020-03-15 DIAGNOSIS — R29898 Other symptoms and signs involving the musculoskeletal system: Secondary | ICD-10-CM | POA: Diagnosis not present

## 2020-03-15 DIAGNOSIS — R29818 Other symptoms and signs involving the nervous system: Secondary | ICD-10-CM | POA: Diagnosis not present

## 2020-03-15 DIAGNOSIS — R1312 Dysphagia, oropharyngeal phase: Secondary | ICD-10-CM | POA: Diagnosis not present

## 2020-03-15 DIAGNOSIS — R490 Dysphonia: Secondary | ICD-10-CM | POA: Diagnosis not present

## 2020-03-18 ENCOUNTER — Other Ambulatory Visit: Payer: Self-pay | Admitting: Family Medicine

## 2020-03-22 DIAGNOSIS — R490 Dysphonia: Secondary | ICD-10-CM | POA: Diagnosis not present

## 2020-03-22 DIAGNOSIS — R29818 Other symptoms and signs involving the nervous system: Secondary | ICD-10-CM | POA: Diagnosis not present

## 2020-03-22 DIAGNOSIS — R1310 Dysphagia, unspecified: Secondary | ICD-10-CM | POA: Diagnosis not present

## 2020-03-22 DIAGNOSIS — R29898 Other symptoms and signs involving the musculoskeletal system: Secondary | ICD-10-CM | POA: Diagnosis not present

## 2020-03-22 DIAGNOSIS — R1314 Dysphagia, pharyngoesophageal phase: Secondary | ICD-10-CM | POA: Diagnosis not present

## 2020-03-22 DIAGNOSIS — R1312 Dysphagia, oropharyngeal phase: Secondary | ICD-10-CM | POA: Diagnosis not present

## 2020-03-31 DIAGNOSIS — R29898 Other symptoms and signs involving the musculoskeletal system: Secondary | ICD-10-CM | POA: Diagnosis not present

## 2020-03-31 DIAGNOSIS — R490 Dysphonia: Secondary | ICD-10-CM | POA: Diagnosis not present

## 2020-03-31 DIAGNOSIS — R1310 Dysphagia, unspecified: Secondary | ICD-10-CM | POA: Diagnosis not present

## 2020-03-31 DIAGNOSIS — R1314 Dysphagia, pharyngoesophageal phase: Secondary | ICD-10-CM | POA: Diagnosis not present

## 2020-03-31 DIAGNOSIS — R1312 Dysphagia, oropharyngeal phase: Secondary | ICD-10-CM | POA: Diagnosis not present

## 2020-03-31 DIAGNOSIS — R29818 Other symptoms and signs involving the nervous system: Secondary | ICD-10-CM | POA: Diagnosis not present

## 2020-04-07 ENCOUNTER — Other Ambulatory Visit: Payer: Self-pay | Admitting: Family Medicine

## 2020-04-15 ENCOUNTER — Other Ambulatory Visit: Payer: Self-pay | Admitting: Family Medicine

## 2020-04-15 DIAGNOSIS — H1031 Unspecified acute conjunctivitis, right eye: Secondary | ICD-10-CM | POA: Diagnosis not present

## 2020-04-18 ENCOUNTER — Telehealth: Payer: Self-pay | Admitting: Family Medicine

## 2020-04-18 NOTE — Telephone Encounter (Signed)
Pt lives at a facility ([facility]) and was informed today that she was exposed to a pt with COVID who was asymptotic and they need persmisson from PCP to test her for COVID. Pt stated they will need to call Hipolito Bayley [phone] in order to give them permission

## 2020-04-18 NOTE — Telephone Encounter (Signed)
Please advise 

## 2020-04-18 NOTE — Telephone Encounter (Signed)
Order given okay to test

## 2020-04-18 NOTE — Telephone Encounter (Signed)
OK to test

## 2020-04-19 ENCOUNTER — Other Ambulatory Visit: Payer: Self-pay

## 2020-04-19 ENCOUNTER — Telehealth (INDEPENDENT_AMBULATORY_CARE_PROVIDER_SITE_OTHER): Payer: Medicare Other | Admitting: Family Medicine

## 2020-04-19 ENCOUNTER — Telehealth: Payer: Self-pay | Admitting: *Deleted

## 2020-04-19 DIAGNOSIS — R634 Abnormal weight loss: Secondary | ICD-10-CM

## 2020-04-19 DIAGNOSIS — Z20822 Contact with and (suspected) exposure to covid-19: Secondary | ICD-10-CM

## 2020-04-19 NOTE — Telephone Encounter (Signed)
On 04/15/2020 at 10:57:07 AM patient called nurse triage line. Caller's thinks she may have pink eye in her right eye. States it started with inflammation yesterday. She Has yellow discharge and pressure. She has eye redness. Eyelid is swollen and red.   Care Advice Given Per Guideline SEE PCP WITHIN 24 HOURS: EYELID CLEANSING: * Gently wash eyelids and lashes with warm water and wet cotton balls (or cotton gauze). Remove all the dried and liquid pus. * Do this whenever pus is seen on the eyelids. Continue to do this until your appointment. * IF OFFICE WILL BE CLOSED: You need to be seen within the next 24 hours. A clinic or an urgent care center is often a good source of care if your doctor's office is closed or you can't get an appointment. CALL BACK IF: * Blurred vision occurs * Fever occurs * You become worse. CARE ADVICE given per Eye - Pus or Discharge (Adult) guideline

## 2020-04-19 NOTE — Telephone Encounter (Signed)
Lvm for pt to call back to set up virtual appt since she is going to get tested for covid as per other phone note

## 2020-04-19 NOTE — Progress Notes (Signed)
Patient ID: Sara Logan, female   DOB: [birth year], 86 y.o.   MRN: [id number]  This visit type was conducted due to national recommendations for restrictions regarding the COVID-19 pandemic in an effort to limit this patient's exposure and mitigate transmission in our community.   Virtual Visit via Telephone Note  I connected with [name] on 04/19/20 at  3:45 PM EDT by telephone and verified that I am speaking with the correct person using two identifiers.   I discussed the limitations, risks, security and privacy concerns of performing an evaluation and management service by telephone and the availability of in person appointments. I also discussed with the patient that there may be a patient responsible charge related to this service. The patient expressed understanding and agreed to proceed.  Location patient: home Location provider: work or home office Participants present for the call: patient, provider Patient did not have a visit in the prior 7 days to address this/these issue(s).   History of Present Illness: Patient called yesterday requested Covid testing.  She lives at [facility].  Apparently last Tuesday she had a gentleman that came to her apartment there to work on her computer.  She states he took his mask off when she was working on the computer and they were in fairly close proximity.  She then subsequently learned that the man tested positive for Covid but she is not sure of the exact timing of that.  She has remained asymptomatic and she has been vaccinated.  Yesterday she was requesting Covid testing and apparently her facility needed a verbal order from [place] and that was given.  She states that her test was done and came back negative.  This was presumably a rapid test as this was done within about 15 minutes.  She denies any fever, dyspnea, headache, loss of taste or smell, diarrhea, cough.  She does relate some weight loss.  She states that she weighed 162 pounds at  another office recently and had weight 178 pounds here in May.  She does feel like she is has lost some weight and perhaps slight decline in appetite.  This is unintentional weight loss.  Past Medical History:  Diagnosis Date  . Arthritis   . Depression   . Hyperlipidemia   . Osteopenia   . Stroke ([place])   . Thrombocytopenia ([place])   . Vitamin D deficiency   . Wrist fracture 2002   left   Past Surgical History:  Procedure Laterality Date  . APPENDECTOMY  1967  . CARPAL TUNNEL RELEASE  2008   right  . JOINT REPLACEMENT  2002   right total     reports that she has never smoked. She has never used smokeless tobacco. She reports that she does not drink alcohol and does not use drugs. family history includes Breast cancer in her mother and sister. Allergies  Allergen Reactions  . Bactrim [Sulfamethoxazole-Trimethoprim] Nausea Only  . Ditropan [Oxybutynin]     Made patient feel bad      Observations/Objective: Patient sounds cheerful and well on the phone. I do not appreciate any SOB. Speech and thought processing are grossly intact. Patient reported vitals:  Assessment and Plan:  #1 recent exposure to possible Covid contact.  Patient asymptomatic.  Rapid testing negative yesterday -Continue to observe.  Her exposure was over week ago so hopefully this is a good sign that she is still asymptomatic and testing negative  #2 reported unintentional weight loss of about 15 to 16 pounds  over the past couple months.  This is not yet documented but is reported by patient  -We recommended scheduling office follow-up to further assess Follow Up Instructions:  -As above.  Schedule follow-up to assess in office to confirm reported weight loss and further assess   99441 5-10 99442 11-20 99443 21-30 I did not refer this patient for an OV in the next 24 hours for this/these issue(s).  I discussed the assessment and treatment plan with the patient. The patient was provided an  opportunity to ask questions and all were answered. The patient agreed with the plan and demonstrated an understanding of the instructions.   The patient was advised to call back or seek an in-person evaluation if the symptoms worsen or if the condition fails to improve as anticipated.  I provided 23 minutes of non-face-to-face time during this encounter.   Carolann Littler, MD

## 2020-04-21 ENCOUNTER — Telehealth: Payer: Self-pay | Admitting: Family Medicine

## 2020-04-21 NOTE — Telephone Encounter (Signed)
Patient needs a 1-2 week f/u appt per Dr. Elease Hashimoto.  Attempted to call on 04/20/20-- no answer 04/21/20- called, someone picked up and then hung up.

## 2020-04-22 NOTE — Telephone Encounter (Signed)
Scheduled appt with pt for 04/26/20.

## 2020-04-23 DIAGNOSIS — R1312 Dysphagia, oropharyngeal phase: Secondary | ICD-10-CM | POA: Diagnosis not present

## 2020-04-23 DIAGNOSIS — R1310 Dysphagia, unspecified: Secondary | ICD-10-CM | POA: Diagnosis not present

## 2020-04-23 DIAGNOSIS — R490 Dysphonia: Secondary | ICD-10-CM | POA: Diagnosis not present

## 2020-04-23 DIAGNOSIS — R1314 Dysphagia, pharyngoesophageal phase: Secondary | ICD-10-CM | POA: Diagnosis not present

## 2020-04-23 DIAGNOSIS — R2681 Unsteadiness on feet: Secondary | ICD-10-CM | POA: Diagnosis not present

## 2020-04-23 DIAGNOSIS — R29818 Other symptoms and signs involving the nervous system: Secondary | ICD-10-CM | POA: Diagnosis not present

## 2020-04-23 DIAGNOSIS — R0600 Dyspnea, unspecified: Secondary | ICD-10-CM | POA: Diagnosis not present

## 2020-04-23 DIAGNOSIS — N3946 Mixed incontinence: Secondary | ICD-10-CM | POA: Diagnosis not present

## 2020-04-23 DIAGNOSIS — R29898 Other symptoms and signs involving the musculoskeletal system: Secondary | ICD-10-CM | POA: Diagnosis not present

## 2020-04-26 ENCOUNTER — Encounter: Payer: Self-pay | Admitting: Family Medicine

## 2020-04-26 ENCOUNTER — Ambulatory Visit (INDEPENDENT_AMBULATORY_CARE_PROVIDER_SITE_OTHER): Payer: Medicare Other | Admitting: Family Medicine

## 2020-04-26 ENCOUNTER — Other Ambulatory Visit: Payer: Self-pay

## 2020-04-26 VITALS — BP 138/80 | HR 78 | Temp 98.8°F | Wt 167.0 lb

## 2020-04-26 DIAGNOSIS — Z20822 Contact with and (suspected) exposure to covid-19: Secondary | ICD-10-CM

## 2020-04-26 DIAGNOSIS — R634 Abnormal weight loss: Secondary | ICD-10-CM | POA: Diagnosis not present

## 2020-04-26 DIAGNOSIS — F339 Major depressive disorder, recurrent, unspecified: Secondary | ICD-10-CM

## 2020-04-26 DIAGNOSIS — I10 Essential (primary) hypertension: Secondary | ICD-10-CM

## 2020-04-26 NOTE — Progress Notes (Signed)
Established Patient Office Visit  Subjective:  Patient ID: Sara Logan, female    DOB: 19-Dec-1932  Age: 86 y.o. MRN: [id number]  CC: No chief complaint on file.   HPI Sara Logan presents for issues as below  She recently had possible exposure to someone with Covid. There was someone at her place of residence who came out to do some work who subsequently tested positive for Covid. She had fairly close exposure. She has had full vaccination with me during the vaccine. She had Covid testing 1 week ago at her place of residence which came back negative. Her exposure was over 2 weeks ago and she has had absolutely no symptoms whatsoever. No cough, headache, myalgias, dyspnea, nausea, vomiting, or diarrhea symptoms  She does relate some weight loss which is been somewhat unintentional. In looking back her weight was 178 pounds February 2020. This went up to 187 pounds 11/20 and was 179 pounds 4/21. Her current weight is 167 pounds. She states her appetite is good. The only conscious decisions she has made is to reduce dessert intake but no other dietary changes. She denies any abdominal pains. No adenopathy. No fevers.  She does have history of recurrent depression and does have some sad mood periodically. She is currently on Effexor 75 mg extended release 2 tablets daily. This has generally worked well for her in the past. She is reluctant to make changes. She still does stay engaged in activities with group activities through her place of residence  Past Medical History:  Diagnosis Date  . Arthritis   . Depression   . Hyperlipidemia   . Osteopenia   . Stroke ([place])   . Thrombocytopenia ([place])   . Vitamin D deficiency   . Wrist fracture 2002   left    Past Surgical History:  Procedure Laterality Date  . APPENDECTOMY  1967  . CARPAL TUNNEL RELEASE  2008   right  . JOINT REPLACEMENT  2002   right total     Family History  Problem Relation Age of Onset  . Breast cancer Mother    . Breast cancer Sister     Social History   Socioeconomic History  . Marital status: Widowed    Spouse name: Not on file  . Number of children: 4  . Years of education: 16  . Highest education level: Bachelor's degree (e.g., BA, AB, BS)  Occupational History  . Not on file  Tobacco Use  . Smoking status: Never Smoker  . Smokeless tobacco: Never Used  Vaping Use  . Vaping Use: Never used  Substance and Sexual Activity  . Alcohol use: No  . Drug use: No  . Sexual activity: Not on file  Other Topics Concern  . Not on file  Social History Narrative   Lives in senior apartment at [facility]   widowed   Social Determinants of Health   Financial Resource Strain: [place]   . Difficulty of Paying Living Expenses: Not hard at all  Food Insecurity: No Food Insecurity  . Worried About Charity fundraiser in the Last Year: Never true  . Ran Out of Food in the Last Year: Never true  Transportation Needs: No Transportation Needs  . Lack of Transportation (Medical): No  . Lack of Transportation (Non-Medical): No  Physical Activity: Inactive  . Days of Exercise per Week: 0 days  . Minutes of Exercise per Session: 0 min  Stress: No Stress Concern Present  . Feeling of  Stress : Only a little  Social Connections: Unknown  . Frequency of Communication with Friends and Family: More than three times a week  . Frequency of Social Gatherings with Friends and Family: Once a week  . Attends Religious Services: Not on file  . Active Member of Clubs or Organizations: Yes  . Attends Archivist Meetings: Not on file  . Marital Status: Widowed  Intimate Partner Violence:   . Fear of Current or Ex-Partner:   . Emotionally Abused:   Marland Kitchen Physically Abused:   . Sexually Abused:     Outpatient Medications Prior to Visit  Medication Sig Dispense Refill  . amLODipine (NORVASC) 5 MG tablet TAKE 1 TABLET ONCE DAILY. 90 tablet 0  . aspirin EC 325 MG EC tablet Take 1 tablet (325 mg  total) by mouth daily. 30 tablet 0  . Biotin 10 MG TABS Take 10 mg by mouth daily.     . Cholecalciferol (VITAMIN D3) 2000 units capsule 1 tablet daily.    Marland Kitchen estradiol (ESTRACE VAGINAL) 0.1 MG/GM vaginal cream Place 1 Applicatorful vaginally 3 (three) times a week. 42.5 g 12  . Misc Natural Products (APPLE CIDER VINEGAR DIET PO) Take 200 mg by mouth.    . Probiotic Product (ALIGN PO) Take 1 capsule by mouth daily.    . rosuvastatin (CRESTOR) 10 MG tablet TAKE 1 TABLET ONCE DAILY. 90 tablet 0  . Specialty Vitamins Products (MAGNESIUM, AMINO ACID CHELATE,) 133 MG tablet Take 1 tablet by mouth daily.    . TURMERIC PO Take by mouth daily.    Marland Kitchen venlafaxine XR (EFFEXOR-XR) 75 MG 24 hr capsule TAKE 2 CAPSULES (150MG ) BY MOUTH DAILY. 60 capsule 0  . vitamin B-12 (CYANOCOBALAMIN) 1000 MCG tablet daily.    . vitamin C (ASCORBIC ACID) 500 MG tablet Take 500 mg by mouth daily.    . vitamin E 100 UNIT capsule Take 100 Units by mouth daily.    . Zinc Sulfate (ZINC 15 PO) Take by mouth.     No facility-administered medications prior to visit.    Allergies  Allergen Reactions  . Bactrim [Sulfamethoxazole-Trimethoprim] Nausea Only  . Ditropan [Oxybutynin]     Made patient feel bad    ROS Review of Systems  Constitutional: Positive for unexpected weight change. Negative for appetite change, chills and fever.  Respiratory: Negative for cough and shortness of breath.   Cardiovascular: Negative for chest pain.  Gastrointestinal: Negative for abdominal pain, blood in stool, nausea and vomiting.  Genitourinary: Negative for hematuria.  Neurological: Negative for headaches.  Hematological: Negative for adenopathy.  Psychiatric/Behavioral: Positive for dysphoric mood. Negative for confusion and suicidal ideas.      Objective:    Physical Exam Vitals reviewed.  Constitutional:      Appearance: Normal appearance.  Cardiovascular:     Rate and Rhythm: Normal rate and regular rhythm.  Pulmonary:      Effort: Pulmonary effort is normal.     Breath sounds: Normal breath sounds.  Abdominal:     Comments: Soft and nontender. She has small umbilical hernia which is nontender. No hepatomegaly or splenomegaly noted  Musculoskeletal:     Cervical back: Neck supple.     Right lower leg: No edema.     Left lower leg: No edema.  Lymphadenopathy:     Cervical: No cervical adenopathy.  Neurological:     General: No focal deficit present.     Mental Status: She is alert.     Cranial Nerves:  No cranial nerve deficit.     BP 138/80 (BP Location: Left Arm, Patient Position: Sitting, Cuff Size: Normal)   Pulse 78   Temp 98.8 F (37.1 C)   Wt 167 lb (75.8 kg)   SpO2 97%   BMI 30.54 kg/m  Wt Readings from Last 3 Encounters:  04/26/20 167 lb (75.8 kg)  01/19/20 178 lb 8 oz (81 kg)  12/30/19 179 lb 14.4 oz (81.6 kg)     Health Maintenance Due  Topic Date Due  . TETANUS/TDAP  09/11/2015  . INFLUENZA VACCINE  04/10/2020    There are no preventive care reminders to display for this patient.  Lab Results  Component Value Date   TSH 1.35 07/18/2018   Lab Results  Component Value Date   WBC 7.4 07/18/2018   HGB 12.0 07/18/2018   HCT 36.7 07/18/2018   MCV 83.4 07/18/2018   PLT 196.0 07/18/2018   Lab Results  Component Value Date   NA 137 12/30/2019   K 4.2 12/30/2019   CO2 30 12/30/2019   GLUCOSE 89 12/30/2019   BUN 22 12/30/2019   CREATININE 0.69 12/30/2019   BILITOT 0.5 02/10/2020   ALKPHOS 72 02/10/2020   AST 16 02/10/2020   ALT 11 02/10/2020   PROT 7.2 02/10/2020   ALBUMIN 3.9 02/10/2020   CALCIUM 9.5 12/30/2019   ANIONGAP 6 06/07/2016   GFR 80.81 12/30/2019   Lab Results  Component Value Date   CHOL 136 02/10/2020   Lab Results  Component Value Date   HDL 42.60 02/10/2020   Lab Results  Component Value Date   LDLCALC 72 02/10/2020   Lab Results  Component Value Date   TRIG 107.0 02/10/2020   Lab Results  Component Value Date   CHOLHDL 3 02/10/2020     Lab Results  Component Value Date   HGBA1C 5.9 (H) 09/26/2015      Assessment & Plan:   #1 recent exposure to Covid. Patient has been fully vaccinated and exposure was over 2 weeks ago with subsequent negative Covid testing 1 week ago and patient asymptomatic. -Observe at this time. Suspect very low risk of having any Covid symptoms from that exposure at this point  #2 unintentional weight loss. In looking back of her chart she had fairly significant fluctuations in weight over time and she has not made some mild dietary changes recently of reducing dessert intake. Fortunately, her appetite has been good  -Check further labs with CBC, comprehensive metabolic panel, TSH, and sed rate -Monitor weights at least weekly -Recommend schedule III month follow-up  #3 hypertension stable  #4 history of recurrent depression. She feels overall fairly stable on Effexor. -We did discuss possible change of medication at this point she is reluctant. She is aware if she ever comes off Effexor would have to taper very slowly  No orders of the defined types were placed in this encounter.   Follow-up: Return in about 3 months (around 07/27/2020).    Carolann Littler, MD

## 2020-04-27 LAB — CBC WITH DIFFERENTIAL/PLATELET
Absolute Monocytes: 916 cells/uL (ref 200–950)
Basophils Absolute: 32 cells/uL (ref 0–200)
Basophils Relative: 0.4 %
Eosinophils Absolute: 253 cells/uL (ref 15–500)
Eosinophils Relative: 3.2 %
HCT: 39 % (ref 35.0–45.0)
Hemoglobin: 12.7 g/dL (ref 11.7–15.5)
Lymphs Abs: 1517 cells/uL (ref 850–3900)
MCH: 28.5 pg (ref 27.0–33.0)
MCHC: 32.6 g/dL (ref 32.0–36.0)
MCV: 87.4 fL (ref 80.0–100.0)
MPV: 9.8 fL (ref 7.5–12.5)
Monocytes Relative: 11.6 %
Neutro Abs: 5182 cells/uL (ref 1500–7800)
Neutrophils Relative %: 65.6 %
Platelets: 155 10*3/uL (ref 140–400)
RBC: 4.46 10*6/uL (ref 3.80–5.10)
RDW: 13.6 % (ref 11.0–15.0)
Total Lymphocyte: 19.2 %
WBC: 7.9 10*3/uL (ref 3.8–10.8)

## 2020-04-27 LAB — COMPREHENSIVE METABOLIC PANEL
AG Ratio: 1.3 (calc) (ref 1.0–2.5)
ALT: 13 U/L (ref 6–29)
AST: 15 U/L (ref 10–35)
Albumin: 3.9 g/dL (ref 3.6–5.1)
Alkaline phosphatase (APISO): 62 U/L (ref 37–153)
BUN: 21 mg/dL (ref 7–25)
CO2: 28 mmol/L (ref 20–32)
Calcium: 9.4 mg/dL (ref 8.6–10.4)
Chloride: 102 mmol/L (ref 98–110)
Creat: 0.67 mg/dL (ref 0.60–0.88)
Globulin: 3 g/dL (calc) (ref 1.9–3.7)
Glucose, Bld: 98 mg/dL (ref 65–99)
Potassium: 3.8 mmol/L (ref 3.5–5.3)
Sodium: 139 mmol/L (ref 135–146)
Total Bilirubin: 0.5 mg/dL (ref 0.2–1.2)
Total Protein: 6.9 g/dL (ref 6.1–8.1)

## 2020-04-27 LAB — TSH: TSH: 1.93 mIU/L (ref 0.40–4.50)

## 2020-04-27 LAB — SEDIMENTATION RATE: Sed Rate: 28 mm/h (ref 0–30)

## 2020-05-13 ENCOUNTER — Telehealth: Payer: Self-pay | Admitting: Family Medicine

## 2020-05-13 NOTE — Progress Notes (Signed)
  Chronic Care Management   Outreach Note  05/13/2020 Name: FELECIA STANFILL MRN: [id number] DOB: [birth year]  Referred by: Eulas Post, MD Reason for referral : No chief complaint on file.   An unsuccessful telephone outreach was attempted today. The patient was referred to the pharmacist for assistance with care management and care coordination.   Follow Up Plan:   Carley Perdue [employer] Scheduler

## 2020-05-16 ENCOUNTER — Encounter (HOSPITAL_COMMUNITY): Payer: Self-pay | Admitting: *Deleted

## 2020-05-16 ENCOUNTER — Other Ambulatory Visit: Payer: Self-pay

## 2020-05-16 ENCOUNTER — Emergency Department (HOSPITAL_COMMUNITY): Payer: Medicare Other

## 2020-05-16 ENCOUNTER — Emergency Department (HOSPITAL_COMMUNITY)
Admission: EM | Admit: 2020-05-16 | Discharge: 2020-05-16 | Disposition: A | Discharge status: Home / Self Care | Payer: Medicare Other | Attending: Emergency Medicine | Admitting: Emergency Medicine

## 2020-05-16 DIAGNOSIS — I10 Essential (primary) hypertension: Secondary | ICD-10-CM | POA: Diagnosis not present

## 2020-05-16 DIAGNOSIS — Y939 Activity, unspecified: Secondary | ICD-10-CM | POA: Insufficient documentation

## 2020-05-16 DIAGNOSIS — I959 Hypotension, unspecified: Secondary | ICD-10-CM | POA: Diagnosis not present

## 2020-05-16 DIAGNOSIS — Z79899 Other long term (current) drug therapy: Secondary | ICD-10-CM | POA: Diagnosis not present

## 2020-05-16 DIAGNOSIS — Y999 Unspecified external cause status: Secondary | ICD-10-CM | POA: Insufficient documentation

## 2020-05-16 DIAGNOSIS — Y929 Unspecified place or not applicable: Secondary | ICD-10-CM | POA: Insufficient documentation

## 2020-05-16 DIAGNOSIS — S0003XA Contusion of scalp, initial encounter: Secondary | ICD-10-CM | POA: Insufficient documentation

## 2020-05-16 DIAGNOSIS — Z7982 Long term (current) use of aspirin: Secondary | ICD-10-CM | POA: Diagnosis not present

## 2020-05-16 DIAGNOSIS — Z8673 Personal history of transient ischemic attack (TIA), and cerebral infarction without residual deficits: Secondary | ICD-10-CM | POA: Insufficient documentation

## 2020-05-16 DIAGNOSIS — W19XXXA Unspecified fall, initial encounter: Secondary | ICD-10-CM | POA: Insufficient documentation

## 2020-05-16 DIAGNOSIS — S199XXA Unspecified injury of neck, initial encounter: Secondary | ICD-10-CM | POA: Diagnosis not present

## 2020-05-16 DIAGNOSIS — G9389 Other specified disorders of brain: Secondary | ICD-10-CM | POA: Diagnosis not present

## 2020-05-16 DIAGNOSIS — M47812 Spondylosis without myelopathy or radiculopathy, cervical region: Secondary | ICD-10-CM | POA: Diagnosis not present

## 2020-05-16 NOTE — Discharge Instructions (Signed)
Recommend alternating warm and cold compress to scalp.  May take Tylenol or ibuprofen as needed for pain.  If you have any new or worsening symptoms such as passing out, blurred vision, severe neck pain, chest pain, inability to walk please seek reevaluation  Return for any new or worsening symptoms

## 2020-05-16 NOTE — ED Triage Notes (Signed)
BIB EMS, pt in dinning room, bumped into employee lost balance, struck head on back of chair, hematoma noted to left post head. No LOC, No Blood Thinners. Alert and oriented.  [id number]% RA CBG 142

## 2020-05-16 NOTE — ED Provider Notes (Signed)
[facility] DEPT Provider Note   CSN: [id number] Arrival date & time: 05/16/20  1416    History Chief Complaint  Patient presents with  . Fall    Sara Logan is a 86 y.o. female with past medical history significant for thrombocytopenia, stroke, osteopenia who presents for evaluation after mechanical fall.  Patient states she was eating when she turned to the left and she bumped into the back of an employee.  Patient states she fell backward and hit the left posterior aspect of her head on the edge of a table.  Had a noted hematoma.  She denies syncope, LOC, anticoagulation.  She has been ambulatory since the incident.  She rates her current pain a 2/10.  She takes a baby aspirin daily however no additional anticoagulants.  Denies headache, blurred vision, lightness, dizziness, neck pain, chest pain, shortness of breath abdominal pain, diarrhea, dysuria, pain to extremities, back pain, bowel or bladder incontinence, saddle paresthesia.  She denies additional aggravating or alleviating factors.  History obtained from patient and past medical records.  No interpreter is used.  Ambulatory to restroom without difficulty  HPI     Past Medical History:  Diagnosis Date  . Arthritis   . Depression   . Hyperlipidemia   . Osteopenia   . Stroke ([place])   . Thrombocytopenia ([place])   . Vitamin D deficiency   . Wrist fracture 2002   left    Patient Active Problem List   Diagnosis Date Noted  . Atrophic vaginitis 03/24/2018  . Urine incontinence 03/24/2018  . Primary osteoarthritis of left knee 06/04/2017  . Near syncope 06/06/2016  . Nausea and vomiting 06/06/2016  . Dyspnea 06/06/2016  . Essential hypertension 10/27/2015  . 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 History of normocytic normochromic anemia 03/18/2015  . Obesity (BMI 30-39.9) 08/07/2013  . Urinary urgency 03/19/2013  . Osteopenia 08/27/2012  . Venous stasis 02/06/2012  . Rosacea  07/05/2011  . Vitamin D deficiency 12/08/2010  . Hyperlipidemia LDL goal <70 09/08/2010  . Depression, recurrent ([place]) 09/08/2010    Past Surgical History:  Procedure Laterality Date  . APPENDECTOMY  1967  . CARPAL TUNNEL RELEASE  2008   right  . JOINT REPLACEMENT  2002   right total      OB History   No obstetric history on file.     Family History  Problem Relation Age of Onset  . Breast cancer Mother   . Breast cancer Sister     Social History   Tobacco Use  . Smoking status: Never Smoker  . Smokeless tobacco: Never Used  Vaping Use  . Vaping Use: Never used  Substance Use Topics  . Alcohol use: No  . Drug use: No    Home Medications Prior to Admission medications   Medication Sig Start Date End Date Taking? Authorizing Provider  amLODipine (NORVASC) 5 MG tablet TAKE 1 TABLET ONCE DAILY. 03/18/20   Burchette, Alinda Sierras, MD  aspirin EC 325 MG EC tablet Take 1 tablet (325 mg total) by mouth daily. 09/27/15   Donzetta Starch, NP  Biotin 10 MG TABS Take 10 mg by mouth daily.     [provider]  Cholecalciferol (VITAMIN D3) 2000 units capsule 1 tablet daily.    [provider]  estradiol (ESTRACE VAGINAL) 0.1 MG/GM vaginal cream Place 1 Applicatorful vaginally 3 (three) times a week. 05/29/19   Burchette, Alinda Sierras, MD  Misc Natural Products (APPLE  CIDER VINEGAR DIET PO) Take 200 mg by mouth.    [provider]  Probiotic Product (ALIGN PO) Take 1 capsule by mouth daily.    [provider]  rosuvastatin (CRESTOR) 10 MG tablet TAKE 1 TABLET ONCE DAILY. 04/08/20   Burchette, Alinda Sierras, MD  Specialty Vitamins Products (MAGNESIUM, AMINO ACID CHELATE,) 133 MG tablet Take 1 tablet by mouth daily.    [provider]  TURMERIC PO Take by mouth daily.    [provider]  venlafaxine XR (EFFEXOR-XR) 75 MG 24 hr capsule TAKE 2 CAPSULES (150MG ) BY MOUTH DAILY. 04/18/20   Burchette, Alinda Sierras, MD  vitamin B-12 (CYANOCOBALAMIN) 1000 MCG  tablet daily.    [provider]  vitamin C (ASCORBIC ACID) 500 MG tablet Take 500 mg by mouth daily.    [provider]  vitamin E 100 UNIT capsule Take 100 Units by mouth daily.    [provider]  Zinc Sulfate (ZINC 15 PO) Take by mouth.    [provider]    Allergies    Bactrim [sulfamethoxazole-trimethoprim] and Ditropan [oxybutynin]  Review of Systems   Review of Systems  Constitutional: Negative.   HENT: Negative.   Respiratory: Negative.   Cardiovascular: Negative.   Gastrointestinal: Negative.   Genitourinary: Negative.   Musculoskeletal: Negative.   Skin: Positive for wound.  Neurological: Negative.   All other systems reviewed and are negative.   Physical Exam Updated Vital Signs BP 136/73 (BP Location: Left Arm)   Pulse 85   Temp 99 F (37.2 C) (Oral)   Resp 16   Wt 75.8 kg   SpO2 95%   BMI 30.54 kg/m   Physical Exam Vitals and nursing note reviewed.  Constitutional:      General: She is not in acute distress.    Appearance: She is well-developed. She is not ill-appearing, toxic-appearing or diaphoretic.  HENT:     Head: Normocephalic and atraumatic.     Jaw: There is normal jaw occlusion.      Comments: No raccoon eyes, battle sign.  Hematoma to left posterior scalp.  No lacerations to suture    Ears:     Comments: No hemotympanum    Nose: Nose normal.     Comments: No septal hematoma, no crepitus    Mouth/Throat:     Lips: Pink.     Mouth: Mucous membranes are moist.     Pharynx: Oropharynx is clear. Uvula midline.     Comments: No loose dentition, tongue midline Eyes:     Extraocular Movements: Extraocular movements intact.     Conjunctiva/sclera: Conjunctivae normal.     Pupils: Pupils are equal, round, and reactive to light.     Comments: EOMs intact  Neck:     Trachea: Trachea and phonation normal.     Comments: No neck stiffness or neck rigidity.  No midline cervical tenderness Cardiovascular:      Rate and Rhythm: Normal rate.     Pulses: Normal pulses.          Radial pulses are 2+ on the right side and 2+ on the left side.       Dorsalis pedis pulses are 2+ on the right side and 2+ on the left side.       Posterior tibial pulses are 2+ on the right side and 2+ on the left side.     Heart sounds: Normal heart sounds.  Pulmonary:     Effort: Pulmonary effort is normal. No respiratory  distress.     Breath sounds: Normal breath sounds and air entry.     Comments: Clear to auscultation bilaterally.  No crepitus or step-offs.  Speaks in full sentences without difficulty Chest:     Chest wall: No swelling, tenderness, crepitus or edema.     Comments: Equal rise and fall of chest Abdominal:     General: Bowel sounds are normal. There is no distension.     Palpations: Abdomen is soft.     Tenderness: There is no abdominal tenderness. There is no right CVA tenderness, left CVA tenderness, guarding or rebound. Negative signs include Murphy's sign.     Hernia: No hernia is present.     Comments: Soft, nontender  Musculoskeletal:        General: Normal range of motion.     Cervical back: Full passive range of motion without pain, normal range of motion and neck supple.     Comments: Moves all 4 extremities without difficulty.  No bony tenderness.  No bony tenderness to anterior posterior ribs.  Pelvis stable, nontender palpation.  Able to flex and extend at joints.  Can straight leg raise without difficulty.  No shortening or rotation of legs bilaterally  Skin:    General: Skin is warm and dry.     Capillary Refill: Capillary refill takes less than 2 seconds.     Comments: Hematoma to left posterior scalp.  No abrasions, lacerations to suture  Neurological:     General: No focal deficit present.     Mental Status: She is alert.     Cranial Nerves: Cranial nerves are intact.     Sensory: Sensation is intact.     Motor: Motor function is intact.     Coordination: Coordination is intact.      Gait: Gait is intact.     Comments: Cranial nerves II through XII grossly intact. Intact sensation bilateral upper and lower extremities 5/5 strength bilateral upper and lower extremities Ambulatory to restroom without difficulty     ED Results / Procedures / Treatments   Labs (all labs ordered are listed, but only abnormal results are displayed) Labs Reviewed - No data to display  EKG None  Radiology CT Head Wo Contrast  Result Date: 05/16/2020 CLINICAL DATA:  Fall, head injury EXAM: CT HEAD WITHOUT CONTRAST CT CERVICAL SPINE WITHOUT CONTRAST TECHNIQUE: Multidetector CT imaging of the head and cervical spine was performed following the standard protocol without intravenous contrast. Multiplanar CT image reconstructions of the cervical spine were also generated. COMPARISON:  None. FINDINGS: CT HEAD FINDINGS Brain: Mild parenchymal volume loss is commensurate with the patient's age. Extensive periventricular white matter changes are present likely reflecting the sequela of small vessel ischemia. No abnormal intra or extra-axial mass lesion or fluid collection. No abnormal mass effect or midline shift. No evidence of acute intracranial hemorrhage or infarct. Ventricular size is normal. Cerebellum unremarkable. Vascular: Unremarkable Skull: Intact Sinuses/Orbits: Paranasal sinuses are clear. Orbits are unremarkable. Other: Mastoid air cells and middle ear cavities are clear. Small left posterior temporal scalp hematoma. CT CERVICAL SPINE FINDINGS Alignment: Normal cervical lordosis.  No listhesis. Skull base and vertebrae: The craniocervical junction is unremarkable. The atlantodental interval is normal with moderate degenerative change noted at the atlantodental articulation. No acute fracture of the cervical spine. No lytic or blastic bone lesions are seen. Soft tissues and spinal canal: No prevertebral fluid or swelling. No visible canal hematoma. Disc levels: Review of the sagittal reformats  demonstrates preservation of vertebral  body height. There is mild intervertebral disc space narrowing and endplate remodeling at K5-3 in keeping with changes of mild degenerative disc disease at this level. Remaining intervertebral disc heights have been preserved though there are annular disc calcifications throughout the cervical spine in keeping with changes of degenerative disc disease. Review of the axial images demonstrates multilevel uncovertebral and facet arthrosis without significant neural foraminal narrowing. No significant canal stenosis. Upper chest: Unremarkable Other: None significant IMPRESSION: 1. Small left posterior temporal scalp hematoma. 2. No acute intracranial abnormality. 3. No acute fracture or subluxation of the cervical spine. 4. Mild degenerative disc disease and facet arthrosis of the cervical spine. Electronically Signed   By: Fidela Salisbury MD   On: 05/16/2020 19:20   CT Cervical Spine Wo Contrast  Result Date: 05/16/2020 CLINICAL DATA:  Fall, head injury EXAM: CT HEAD WITHOUT CONTRAST CT CERVICAL SPINE WITHOUT CONTRAST TECHNIQUE: Multidetector CT imaging of the head and cervical spine was performed following the standard protocol without intravenous contrast. Multiplanar CT image reconstructions of the cervical spine were also generated. COMPARISON:  None. FINDINGS: CT HEAD FINDINGS Brain: Mild parenchymal volume loss is commensurate with the patient's age. Extensive periventricular white matter changes are present likely reflecting the sequela of small vessel ischemia. No abnormal intra or extra-axial mass lesion or fluid collection. No abnormal mass effect or midline shift. No evidence of acute intracranial hemorrhage or infarct. Ventricular size is normal. Cerebellum unremarkable. Vascular: Unremarkable Skull: Intact Sinuses/Orbits: Paranasal sinuses are clear. Orbits are unremarkable. Other: Mastoid air cells and middle ear cavities are clear. Small left posterior temporal  scalp hematoma. CT CERVICAL SPINE FINDINGS Alignment: Normal cervical lordosis.  No listhesis. Skull base and vertebrae: The craniocervical junction is unremarkable. The atlantodental interval is normal with moderate degenerative change noted at the atlantodental articulation. No acute fracture of the cervical spine. No lytic or blastic bone lesions are seen. Soft tissues and spinal canal: No prevertebral fluid or swelling. No visible canal hematoma. Disc levels: Review of the sagittal reformats demonstrates preservation of vertebral body height. There is mild intervertebral disc space narrowing and endplate remodeling at Z7-6 in keeping with changes of mild degenerative disc disease at this level. Remaining intervertebral disc heights have been preserved though there are annular disc calcifications throughout the cervical spine in keeping with changes of degenerative disc disease. Review of the axial images demonstrates multilevel uncovertebral and facet arthrosis without significant neural foraminal narrowing. No significant canal stenosis. Upper chest: Unremarkable Other: None significant IMPRESSION: 1. Small left posterior temporal scalp hematoma. 2. No acute intracranial abnormality. 3. No acute fracture or subluxation of the cervical spine. 4. Mild degenerative disc disease and facet arthrosis of the cervical spine. Electronically Signed   By: Fidela Salisbury MD   On: 05/16/2020 19:20    Procedures Procedures (including critical care time)  Medications Ordered in ED Medications - No data to display  ED Course  I have reviewed the triage vital signs and the nursing notes.  Pertinent labs & imaging results that were available during my care of the patient were reviewed by me and considered in my medical decision making (see chart for details).  Patient presents for evaluation after mechanical fall which occurred just PTA.  Hit posterior left scalp on dining room table.  She has small hematoma.  No  breaks in skin, lacerations to suture.  She is neurovascularly intact.  No bony tenderness to extremities.  Equal rise and fall to chest.  Her heart and lungs are  clear.  Her abdomen is soft, nontender.  No midline spinal tenderness.  Her pelvis is stable, nontender palpation.  She has no shortening or rotation of legs.  Tenderness to anterior posterior chest wall.  Low suspicion for acute rib fracture.  No syncope, LOC or anticoagulation.  Given hematoma will obtain CT head, cervical spine. Does not want additional imaging or further work-up.  I feel this is reasonable.  CT head without acute changes CT cervical without acute changes  She ambulated to restroom without difficulty.  She remains neurovascularly intact.  Imaging reassuring.  Will DC home with symptomatic management discussed return precautions.  Patient voiced understanding is agreeable for follow-up.  The patient has been appropriately medically screened and/or stabilized in the ED. I have low suspicion for any other emergent medical condition which would require further screening, evaluation or treatment in the ED or require inpatient management.  Patient is hemodynamically stable and in no acute distress.  Patient able to ambulate in department prior to ED.  Evaluation does not show acute pathology that would require ongoing or additional emergent interventions while in the emergency department or further inpatient treatment.  I have discussed the diagnosis with the patient and answered all questions.  Pain is been managed while in the emergency department and patient has no further complaints prior to discharge.  Patient is comfortable with plan discussed in room and is stable for discharge at this time.  I have discussed strict return precautions for returning to the emergency department.  Patient was encouraged to follow-up with PCP/specialist refer to at discharge.  Patient seen eval by attending, Dr. Kathrynn Humble who agrees above  treatment, plan and disposition    MDM Rules/Calculators/A&P                           Final Clinical Impression(s) / ED Diagnoses Final diagnoses:  Fall, initial encounter  Hematoma of scalp, initial encounter    Rx / DC Orders ED Discharge Orders    None       Lorelle Macaluso A, PA-C 05/16/20 2006    Varney Biles, MD 05/16/20 2016

## 2020-05-17 ENCOUNTER — Other Ambulatory Visit: Payer: Self-pay | Admitting: Family Medicine

## 2020-05-18 ENCOUNTER — Encounter: Payer: Self-pay | Admitting: Family Medicine

## 2020-05-18 ENCOUNTER — Other Ambulatory Visit: Payer: Self-pay

## 2020-05-18 ENCOUNTER — Ambulatory Visit (INDEPENDENT_AMBULATORY_CARE_PROVIDER_SITE_OTHER): Payer: Medicare Other | Admitting: Family Medicine

## 2020-05-18 VITALS — BP 120/60 | HR 104 | Temp 98.6°F | Ht 62.0 in | Wt 167.0 lb

## 2020-05-18 DIAGNOSIS — S0003XA Contusion of scalp, initial encounter: Secondary | ICD-10-CM

## 2020-05-18 DIAGNOSIS — S0990XD Unspecified injury of head, subsequent encounter: Secondary | ICD-10-CM

## 2020-05-18 NOTE — Progress Notes (Signed)
Established Patient Office Visit  Subjective:  Patient ID: Sara Logan, female    DOB: 07-Aug-1933  Age: 86 y.o. MRN: [id number]  CC:  Chief Complaint  Patient presents with  . Fall    follow-up    HPI Sara Logan presents for ER follow-up following recent fall.  This occurred a couple days ago.  She was at [facility] and just had lunch.  After standing up, she accidentally bumped into an employee and lost her balance and fell backward.  She states she struck her head over the arm of a chair.  There was no loss of consciousness.  There was no significant confusion.  She was taken to the ER for further evaluation.  She had CT head and CT cervical spine and those x-rays were reviewed.  No evidence for fracture.  She had degenerative changes of the spine.  No acute intracranial abnormality.  She had hematoma left parietal region.  Patient was discharged home.  She has had some balance issues in general but no other recent falls.  She has no headache at this time.  No confusion.  No nausea or vomiting.  No cognitive changes.  No other injuries reported other than small contusion left buttock region.  Ambulating without difficulty.  Past Medical History:  Diagnosis Date  . Arthritis   . Depression   . Hyperlipidemia   . Osteopenia   . Stroke ([name])   . Thrombocytopenia ([place])   . Vitamin D deficiency   . Wrist fracture 2002   left    Past Surgical History:  Procedure Laterality Date  . APPENDECTOMY  1967  . CARPAL TUNNEL RELEASE  2008   right  . JOINT REPLACEMENT  2002   right total     Family History  Problem Relation Age of Onset  . Breast cancer Mother   . Breast cancer Sister     Social History   Socioeconomic History  . Marital status: Widowed    Spouse name: Not on file  . Number of children: 4  . Years of education: 16  . Highest education level: Bachelor's degree (e.g., BA, AB, BS)  Occupational History  . Not on file  Tobacco Use  . Smoking  status: Never Smoker  . Smokeless tobacco: Never Used  Vaping Use  . Vaping Use: Never used  Substance and Sexual Activity  . Alcohol use: No  . Drug use: No  . Sexual activity: Not on file  Other Topics Concern  . Not on file  Social History Narrative   Lives in senior apartment at [facility]   widowed   Social Determinants of Health   Financial Resource Strain: [place]   . Difficulty of Paying Living Expenses: Not hard at all  Food Insecurity: No Food Insecurity  . Worried About Charity fundraiser in the Last Year: Never true  . Ran Out of Food in the Last Year: Never true  Transportation Needs: No Transportation Needs  . Lack of Transportation (Medical): No  . Lack of Transportation (Non-Medical): No  Physical Activity: Inactive  . Days of Exercise per Week: 0 days  . Minutes of Exercise per Session: 0 min  Stress: No Stress Concern Present  . Feeling of Stress : Only a little  Social Connections: Unknown  . Frequency of Communication with Friends and Family: More than three times a week  . Frequency of Social Gatherings with Friends and Family: Once a week  . Attends  Religious Services: Not on file  . Active Member of Clubs or Organizations: Yes  . Attends Archivist Meetings: Not on file  . Marital Status: Widowed  Intimate Partner Violence:   . Fear of Current or Ex-Partner: Not on file  . Emotionally Abused: Not on file  . Physically Abused: Not on file  . Sexually Abused: Not on file    Outpatient Medications Prior to Visit  Medication Sig Dispense Refill  . amLODipine (NORVASC) 5 MG tablet TAKE 1 TABLET ONCE DAILY. 90 tablet 0  . aspirin EC 325 MG EC tablet Take 1 tablet (325 mg total) by mouth daily. 30 tablet 0  . Biotin 10 MG TABS Take 10 mg by mouth daily.     . Cholecalciferol (VITAMIN D3) 2000 units capsule 1 tablet daily.    Marland Kitchen estradiol (ESTRACE VAGINAL) 0.1 MG/GM vaginal cream Place 1 Applicatorful vaginally 3 (three) times a week. 42.5  g 12  . Misc Natural Products (APPLE CIDER VINEGAR DIET PO) Take 200 mg by mouth.    . Probiotic Product (ALIGN PO) Take 1 capsule by mouth daily.    . rosuvastatin (CRESTOR) 10 MG tablet TAKE 1 TABLET ONCE DAILY. 90 tablet 0  . Specialty Vitamins Products (MAGNESIUM, AMINO ACID CHELATE,) 133 MG tablet Take 1 tablet by mouth daily.    . TURMERIC PO Take by mouth daily.    Marland Kitchen venlafaxine XR (EFFEXOR-XR) 75 MG 24 hr capsule TAKE 2 CAPSULES (150MG ) BY MOUTH DAILY. 60 capsule 0  . vitamin B-12 (CYANOCOBALAMIN) 1000 MCG tablet daily.    . vitamin C (ASCORBIC ACID) 500 MG tablet Take 500 mg by mouth daily.    . vitamin E 100 UNIT capsule Take 100 Units by mouth daily.    . Zinc Sulfate (ZINC 15 PO) Take by mouth.     No facility-administered medications prior to visit.    Allergies  Allergen Reactions  . Bactrim [Sulfamethoxazole-Trimethoprim] Nausea Only  . Ditropan [Oxybutynin]     Made patient feel bad    ROS Review of Systems  Constitutional: Negative for chills and fever.  Respiratory: Negative for cough.   Cardiovascular: Negative for chest pain.  Gastrointestinal: Negative for abdominal pain.  Musculoskeletal: Negative for back pain.  Neurological: Negative for dizziness, seizures, syncope, weakness and headaches.      Objective:    Physical Exam Vitals reviewed.  Constitutional:      Appearance: Normal appearance.  HENT:     Head:     Comments: She has small hematoma left parietal region.  Minimally tender. Cardiovascular:     Rate and Rhythm: Normal rate and regular rhythm.  Pulmonary:     Effort: Pulmonary effort is normal.     Breath sounds: Normal breath sounds.  Musculoskeletal:     Right lower leg: No edema.     Left lower leg: No edema.  Neurological:     General: No focal deficit present.     Mental Status: She is alert.     Cranial Nerves: No cranial nerve deficit.     BP 120/60   Pulse (!) 104   Temp 98.6 F (37 C) (Oral)   Ht 5\' 2"  (1.575 m)    Wt 167 lb (75.8 kg)   SpO2 95%   BMI 30.54 kg/m  Wt Readings from Last 3 Encounters:  05/18/20 167 lb (75.8 kg)  05/16/20 167 lb (75.8 kg)  04/26/20 167 lb (75.8 kg)     Health Maintenance Due  Topic  Date Due  . TETANUS/TDAP  09/11/2015  . INFLUENZA VACCINE  04/10/2020    There are no preventive care reminders to display for this patient.  Lab Results  Component Value Date   TSH 1.93 04/26/2020   Lab Results  Component Value Date   WBC 7.9 04/26/2020   HGB 12.7 04/26/2020   HCT 39.0 04/26/2020   MCV 87.4 04/26/2020   PLT 155 04/26/2020   Lab Results  Component Value Date   NA 139 04/26/2020   K 3.8 04/26/2020   CO2 28 04/26/2020   GLUCOSE 98 04/26/2020   BUN 21 04/26/2020   CREATININE 0.67 04/26/2020   BILITOT 0.5 04/26/2020   ALKPHOS 72 02/10/2020   AST 15 04/26/2020   ALT 13 04/26/2020   PROT 6.9 04/26/2020   ALBUMIN 3.9 02/10/2020   CALCIUM 9.4 04/26/2020   ANIONGAP 6 06/07/2016   GFR 80.81 12/30/2019   Lab Results  Component Value Date   CHOL 136 02/10/2020   Lab Results  Component Value Date   HDL 42.60 02/10/2020   Lab Results  Component Value Date   LDLCALC 72 02/10/2020   Lab Results  Component Value Date   TRIG 107.0 02/10/2020   Lab Results  Component Value Date   CHOLHDL 3 02/10/2020   Lab Results  Component Value Date   HGBA1C 5.9 (H) 09/26/2015      Assessment & Plan:  #1 fall with closed head injury and small hematoma of scalp.  Recent fall with closed head injury with small hematoma.  CT head and neck unremarkable.  -We recommend that she look into getting some PT at her place of residence and if needed we can place referral for that. -Discussed signs and symptoms to watch out for with head injury  #2  Balance issues.   Recommending PT as above.    No orders of the defined types were placed in this encounter.   Follow-up: No follow-ups on file.    Carolann Littler, MD

## 2020-05-18 NOTE — Patient Instructions (Signed)
[facility] (pp. [phone]). [place], [place]. Elsevier."> Neurosurgery, 80(1), 6-15. Retrieved on March 01, 2019.[email or web address]"> Primary Care (5th ed., pp. 218-221). [place], [place]: Elsevier."> Brain Injury, 29(6), [phone]. [email or web address]"> Rosen's Emergency Medicine: Concepts and Clinical Practice (9th ed., pp. 301-329). [place], [place]: Elsevier.">  Head Injury, Adult There are many types of head injuries. Head injuries can be as minor as a bump, or they can be a serious medical issue. More severe head injuries include:  A jarring injury to the brain (concussion).  A bruise (contusion) of the brain. This means there is bleeding in the brain that can cause swelling.  A cracked skull (skull fracture).  Bleeding in the brain that collects, clots, and forms a bump (hematoma). After a head injury, most problems occur within the first 24 hours, but side effects may occur up to 7-10 days after the injury. It is important to watch your condition for any changes. You may need to be observed in the emergency department or urgent care, or you may be admitted to the hospital. What are the causes? There are many possible causes of a head injury. A serious head injury may be caused by a car accident, bicycle or motorcycle accidents, sports injuries, and falls. What are the symptoms? Symptoms of a head injury include a contusion, bump, or bleeding at the site of the injury. Other physical symptoms may include:  Headache.  Nausea or vomiting.  Dizziness.  Feeling tired.  Being uncomfortable around bright lights or loud noises.  Seizures.  Trouble being awakened.  Fainting. Mental or emotional symptoms may include:  Irritability.  Confusion and memory problems.  Poor attention and concentration.  Changes in eating or sleeping habits.  Anxiety or depression. How is this diagnosed? This condition can  usually be diagnosed based on your symptoms, a description of the injury, and a physical exam. You may also have imaging tests done, such as a CT scan or MRI. How is this treated? Treatment for this condition depends on the severity and type of injury you have. The main goal of treatment is to prevent complications and to allow the brain time to heal. Mild head injury If you have a mild head injury, you may be sent home and treatment may include:  Observation. A responsible adult should stay with you for 24 hours after your injury and check on you often.  Physical rest.  Brain rest.  Pain medicines. Severe head injury If you have a severe head injury, treatment may include:  Close observation. This includes hospitalization with frequent physical exams.  Medicines to relieve pain, prevent seizures, and decrease brain swelling.  Breathing support. This may include using a ventilator.  Treatments to manage the swelling inside the brain.  Brain surgery. This may be needed to: ? Remove a blood clot. ? Stop the bleeding. ? Remove a part of the skull to allow room for the brain to swell. Follow these instructions at home: Activity  Rest and avoid activities that are physically hard or tiring.  Make sure you get enough sleep.  Limit activities that require a lot of thought or attention, such as: ? Watching TV. ? Playing memory games and puzzles. ? Job-related work or homework. ? Working on [employer], and texting.  Avoid activities that could cause another head injury, such as playing sports, until your health care provider approves. Having another head injury, especially before the first one has healed, can be dangerous.  Ask your health care  provider when it is safe for you to return to your regular activities, including work or school. Ask your health care provider for a step-by-step plan for gradually returning to activities.  Ask your health care  provider when you can drive, ride a bicycle, or use heavy machinery. Your ability to react may be slower after a brain injury. Do not do these activities if you are dizzy. Lifestyle   Do not drink alcohol until your health care provider approves. Do not use drugs. Alcohol and certain drugs may slow your recovery and can put you at risk of further injury.  If it is harder than usual to remember things, write them down.  If you are easily distracted, try to do one thing at a time.  Talk with family members or close friends when making important decisions.  Tell your friends, family, a trusted colleague, and work Freight forwarder about your injury, symptoms, and restrictions. Have them watch for any new or worsening problems. General instructions  Take over-the-counter and prescription medicines only as told by your health care provider.  Have someone stay with you for 24 hours after your head injury. This person should watch you for any changes in your symptoms and be ready to seek medical help.  Keep all follow-up visits as told by your health care provider. This is important. How is this prevented?  Work on improving your balance and strength to avoid falls.  Wear a seatbelt when you are in a moving vehicle.  Wear a helmet when riding a bicycle, skiing, or doing any other sport or activity that has a risk of injury.  If you drink alcohol: ? Limit how much you use to:  0-1 drink a day for women.  0-2 drinks a day for men. ? Be aware of how much alcohol is in your drink. In the [place] one drink equals one 12 oz bottle of beer (355 mL), one 5 oz glass of wine (148 mL), or one 1 oz glass of hard liquor (44 mL).  Take safety measures in your home, such as: ? Removing clutter and tripping hazards from floors and stairways. ? Using grab bars in bathrooms and handrails by stairs. ? Placing non-slip mats on floors and in bathtubs. ? Improving lighting in dim areas. Get help right away  if:  You have: ? A severe headache that is not helped by medicine. ? Trouble walking or weakness in your arms and legs. ? Clear or bloody fluid coming from your nose or ears. ? Changes in your vision. ? A seizure.  You lose your balance.  You vomit.  Your pupils change size.  Your speech is slurred.  Your dizziness gets worse.  You faint.  You are sleepier than normal and have trouble staying awake.  Your symptoms get worse. These symptoms may represent a serious problem that is an emergency. Do not wait to see if the symptoms will go away. Get medical help right away. Call your local emergency services (911 in the [place] Do not drive yourself to the hospital. Summary  Head injuries can be minor or they can be a serious medical issue requiring immediate attention.  Treatment for this condition depends on the severity and type of injury you have.  Ask your health care provider when it is safe for you to return to your regular activities, including work or school.  Head injury prevention includes wearing a seat belt in a motor vehicle, using a helmet on a bicycle, limiting alcohol  use, and taking safety measures in your home. This information is not intended to replace advice given to you by your health care provider. Make sure you discuss any questions you have with your health care provider. Document Revised: 09/24/2018 Document Reviewed: 09/19/2018 Elsevier Patient Education  [place].

## 2020-05-20 ENCOUNTER — Other Ambulatory Visit: Payer: Self-pay | Admitting: Family Medicine

## 2020-05-20 DIAGNOSIS — Z1231 Encounter for screening mammogram for malignant neoplasm of breast: Secondary | ICD-10-CM

## 2020-05-25 ENCOUNTER — Telehealth: Payer: Self-pay | Admitting: Family Medicine

## 2020-05-25 NOTE — Progress Notes (Signed)
  Chronic Care Management   Note  05/25/2020 Name: FYNLEY CHRYSTAL MRN: [id number] DOB: [birth year]  BRIEANNA NAU is a 86 y.o. year old female who is a primary care patient of Burchette, Alinda Sierras, MD. I reached out to De Nurse by phone today in response to a referral sent by Ms. Ginnie Smart PCP, Eulas Post, MD.   Ms. Hipp was given information about Chronic Care Management services today including:  1. CCM service includes personalized support from designated clinical staff supervised by her physician, including individualized plan of care and coordination with other care providers 2. 24/7 contact phone numbers for assistance for urgent and routine care needs. 3. Service will only be billed when office clinical staff spend 20 minutes or more in a month to coordinate care. 4. Only one practitioner may furnish and bill the service in a calendar month. 5. The patient may stop CCM services at any time (effective at the end of the month) by phone call to the office staff.   Patient agreed to services and verbal consent obtained.   Follow up plan:   Carley Perdue [employer] Scheduler

## 2020-06-07 ENCOUNTER — Other Ambulatory Visit: Payer: Self-pay

## 2020-06-07 ENCOUNTER — Telehealth: Payer: Self-pay | Admitting: Family Medicine

## 2020-06-07 DIAGNOSIS — H9190 Unspecified hearing loss, unspecified ear: Secondary | ICD-10-CM

## 2020-06-07 NOTE — Telephone Encounter (Signed)
OK to refer.

## 2020-06-07 NOTE — Telephone Encounter (Signed)
Referral has been placed. 

## 2020-06-07 NOTE — Telephone Encounter (Signed)
OK 

## 2020-06-07 NOTE — Telephone Encounter (Signed)
Pt is having an hearing exam at the [facility] and needs a referral.  Her appointment is tomorrow at 1:00.    [facility] phone- [phone] Fax: [phone]

## 2020-06-08 DIAGNOSIS — H903 Sensorineural hearing loss, bilateral: Secondary | ICD-10-CM | POA: Diagnosis not present

## 2020-06-15 ENCOUNTER — Other Ambulatory Visit: Payer: Self-pay | Admitting: Family Medicine

## 2020-06-19 ENCOUNTER — Encounter: Payer: Self-pay | Admitting: Family Medicine

## 2020-06-22 ENCOUNTER — Encounter: Payer: Self-pay | Admitting: Family Medicine

## 2020-06-22 DIAGNOSIS — Z23 Encounter for immunization: Secondary | ICD-10-CM | POA: Diagnosis not present

## 2020-06-30 ENCOUNTER — Other Ambulatory Visit: Payer: Self-pay | Admitting: Family Medicine

## 2020-07-07 ENCOUNTER — Other Ambulatory Visit: Payer: Self-pay

## 2020-07-07 ENCOUNTER — Ambulatory Visit
Admission: RE | Admit: 2020-07-07 | Discharge: 2020-07-07 | Disposition: A | Discharge status: Home / Self Care | Payer: Medicare Other | Source: Ambulatory Visit | Attending: Family Medicine | Admitting: Family Medicine

## 2020-07-07 DIAGNOSIS — Z1231 Encounter for screening mammogram for malignant neoplasm of breast: Secondary | ICD-10-CM

## 2020-07-08 ENCOUNTER — Other Ambulatory Visit: Payer: Self-pay | Admitting: Family Medicine

## 2020-07-12 ENCOUNTER — Telehealth: Payer: Self-pay | Admitting: Pharmacist

## 2020-07-12 NOTE — Chronic Care Management (AMB) (Signed)
    Chronic Care Management Pharmacy Assistant   Name: Sara Logan  MRN: [id number] DOB: 11/09/1932  Reason for Encounter: Medication Review/Initial Questions for Pharmacist visit on 07/14/2020  Patient Questions: 1. Have you seen any other providers since your last visit? No 2. Any changes in your medications or health? No 3. Any side effects from any medications? No 4. Do you have any symptoms or problems not managed by your medications?   5. Any concerns about your health right now? Yes, she has questions about what times to take her medication. 6. Has your provider asked that you check blood pressure, blood sugar, or follow a special diet at home? No 7. Do you get any type of exercise regularly? No 8. Can you think of a goal you would like to reach for your health? Yes, she would like to take some classes to help with strengthening and balance. 9. Do you have any problems getting your medications? No 10. Is there anything that you would like to discuss during the appointment? No  PCP : Eulas Post, MD  Allergies:   Allergies  Allergen Reactions  . Bactrim [Sulfamethoxazole-Trimethoprim] Nausea Only  . Ditropan [Oxybutynin]     Made patient feel bad    Medications: Outpatient Encounter Medications as of 07/12/2020  Medication Sig  . venlafaxine XR (EFFEXOR-XR) 75 MG 24 hr capsule TAKE 2 CAPSULES (150MG ) BY MOUTH DAILY.  Marland Kitchen amLODipine (NORVASC) 5 MG tablet TAKE 1 TABLET ONCE DAILY.  Marland Kitchen aspirin EC 325 MG EC tablet Take 1 tablet (325 mg total) by mouth daily.  . Biotin 10 MG TABS Take 10 mg by mouth daily.   . Cholecalciferol (VITAMIN D3) 2000 units capsule 1 tablet daily.  Marland Kitchen estradiol (ESTRACE VAGINAL) 0.1 MG/GM vaginal cream Place 1 Applicatorful vaginally 3 (three) times a week.  . Misc Natural Products (APPLE CIDER VINEGAR DIET PO) Take 200 mg by mouth.  . Probiotic Product (ALIGN PO) Take 1 capsule by mouth daily.  . rosuvastatin (CRESTOR) 10 MG tablet TAKE 1  TABLET ONCE DAILY.  Marland Kitchen Specialty Vitamins Products (MAGNESIUM, AMINO ACID CHELATE,) 133 MG tablet Take 1 tablet by mouth daily.  . TURMERIC PO Take by mouth daily.  . vitamin B-12 (CYANOCOBALAMIN) 1000 MCG tablet daily.  . vitamin C (ASCORBIC ACID) 500 MG tablet Take 500 mg by mouth daily.  . vitamin E 100 UNIT capsule Take 100 Units by mouth daily.  . Zinc Sulfate (ZINC 15 PO) Take by mouth.   No facility-administered encounter medications on file as of 07/12/2020.    Current Diagnosis: Patient Active Problem List   Diagnosis Date Noted  . Atrophic vaginitis 03/24/2018  . Urine incontinence 03/24/2018  . Primary osteoarthritis of left knee 06/04/2017  . Near syncope 06/06/2016  . Nausea and vomiting 06/06/2016  . Dyspnea 06/06/2016  . Essential hypertension 10/27/2015  . 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 History of normocytic normochromic anemia 03/18/2015  . Obesity (BMI 30-39.9) 08/07/2013  . Urinary urgency 03/19/2013  . Osteopenia 08/27/2012  . Venous stasis 02/06/2012  . Rosacea 07/05/2011  . Vitamin D deficiency 12/08/2010  . Hyperlipidemia LDL goal <70 09/08/2010  . Depression, recurrent ([place]) 09/08/2010    Goals Addressed   None     Follow-Up:  Pharmacist Review   Maia Breslow, [place] Assistant [phone]

## 2020-07-13 ENCOUNTER — Other Ambulatory Visit: Payer: Self-pay | Admitting: Family Medicine

## 2020-07-13 DIAGNOSIS — I10 Essential (primary) hypertension: Secondary | ICD-10-CM

## 2020-07-13 DIAGNOSIS — E559 Vitamin D deficiency, unspecified: Secondary | ICD-10-CM

## 2020-07-13 DIAGNOSIS — M1712 Unilateral primary osteoarthritis, left knee: Secondary | ICD-10-CM

## 2020-07-13 DIAGNOSIS — E785 Hyperlipidemia, unspecified: Secondary | ICD-10-CM

## 2020-07-13 NOTE — Chronic Care Management (AMB) (Deleted)
Chronic Care Management Pharmacy  Name: Sara Logan  MRN: [id number] DOB: 1932-12-20  Initial Planning Appointment: completed 07/11/20  Initial Questions: 1. Have you seen any other providers since your last visit? n/a 2. Any changes in your medicines or health? {YES/NO:21197}  Chief Complaint/ HPI  De Nurse,  86 y.o. , female presents for their Initial CCM visit with the clinical pharmacist In office.  PCP : Eulas Post, MD  Their chronic conditions include: {CHL AMB CHRONIC MEDICAL [phone]}  Office Visits: -05/18/20 Carolann Littler, MD: Patient presented for follow up from ED visit for a fall. She struck her head on the arm of a chair but denies loss of consciousness. No evidence for a fracture was found. Recommended finding PT at her place of residence.   -04/26/20 Carolann Littler, MD: Patient presented with unintentional weight loss, HTN and depression follow up. Patient reports appetite is good and has made a decision to reduce dessert intake. Patient does report some sad mood periodically and Effexor has generally worked well for her in the past. Recommended follow up in 3 months.   -04/19/20 Carolann Littler, MD: Patient presented for video visit post possible COVID exposure. Patiient had testing done at her facility and it came back negative. She denies any COVID symptoms at this time. Recommended follow up in office.   -01/29/20 Carolann Littler, MD: Patient presented for telephone visit for concerns for fatigue and was wondering if this was related to Crestor. Recommended 1 month more for trial of Crestor 10 mg daily and will consider alternatives if she continues to experience fatigue.  -01/19/20 Carolann Littler, MD: Patient presented with mild confusion post dental procedure. Patient did not take any opioids after the procedure and is now back to baseline.  -01/13/20 Carolann Littler, MD: Patient presented for phone visit for chronic conditions follow up.  Continued current medications. Labs scheduled for February 10, 2020.  Consult Visit: -05/16/20 Patient presented to the ED for examination post fall.  -11/23/19 Beatrix Fetters (optometry): Patient presented for routine eye exam. Recommended artificial tears 2-4 times daily for dry eyes and recommended systane gel or systane ointment to prevent overnight dryness/dehydration.   Medications: Outpatient Encounter Medications as of 07/14/2020  Medication Sig  . venlafaxine XR (EFFEXOR-XR) 75 MG 24 hr capsule TAKE 2 CAPSULES (150MG) BY MOUTH DAILY.  Marland Kitchen amLODipine (NORVASC) 5 MG tablet TAKE 1 TABLET ONCE DAILY.  Marland Kitchen aspirin EC 325 MG EC tablet Take 1 tablet (325 mg total) by mouth daily.  . Biotin 10 MG TABS Take 10 mg by mouth daily.   . Cholecalciferol (VITAMIN D3) 2000 units capsule 1 tablet daily.  Marland Kitchen estradiol (ESTRACE VAGINAL) 0.1 MG/GM vaginal cream Place 1 Applicatorful vaginally 3 (three) times a week.  . Misc Natural Products (APPLE CIDER VINEGAR DIET PO) Take 200 mg by mouth.  . Probiotic Product (ALIGN PO) Take 1 capsule by mouth daily.  . rosuvastatin (CRESTOR) 10 MG tablet TAKE 1 TABLET ONCE DAILY.  Marland Kitchen Specialty Vitamins Products (MAGNESIUM, AMINO ACID CHELATE,) 133 MG tablet Take 1 tablet by mouth daily.  . TURMERIC PO Take by mouth daily.  . vitamin B-12 (CYANOCOBALAMIN) 1000 MCG tablet daily.  . vitamin C (ASCORBIC ACID) 500 MG tablet Take 500 mg by mouth daily.  . vitamin E 100 UNIT capsule Take 100 Units by mouth daily.  . Zinc Sulfate (ZINC 15 PO) Take by mouth.   No facility-administered encounter medications on file as of 07/14/2020.     Current  Diagnosis/Assessment:  Goals Addressed   None     Hypertension   BP goal is:  {CHL HP UPSTREAM Pharmacist BP [phone]}  Office blood pressures are  BP Readings from Last 3 Encounters:  05/18/20 120/60  05/16/20 (!) 156/93  04/26/20 138/80   Patient checks BP at home {CHL HP BP Monitoring [phone]} Patient  home BP readings are ranging: ***  Patient has failed these meds in the past: *** Patient is currently {CHL Controlled/Uncontrolled:[phone]} on the following medications:  . Amlodipine 5 mg 1 tablet daily  We discussed {CHL HP Upstream Pharmacy discussion:[phone]}  Plan  Continue {CHL HP Upstream Pharmacy [phone]}     Hyperlipidemia   LDL goal < 70  Last lipids Lab Results  Component Value Date   CHOL 136 02/10/2020   HDL 42.60 02/10/2020   LDLCALC 72 02/10/2020   LDLDIRECT 147.0 11/17/2012   TRIG 107.0 02/10/2020   CHOLHDL 3 02/10/2020   Hepatic Function Latest Ref Rng & Units 04/26/2020 02/10/2020 12/30/2019  Total Protein 6.1 - 8.1 g/dL 6.9 7.2 7.1  Albumin 3.5 - 5.2 g/dL - 3.9 3.8  AST 10 - 35 U/L 15 16 12   ALT 6 - 29 U/L 13 11 10   Alk Phosphatase 39 - 117 U/L - 72 73  Total Bilirubin 0.2 - 1.2 mg/dL 0.5 0.5 0.5  Bilirubin, Direct 0.0 - 0.3 mg/dL - 0.1 0.1     The ASCVD Risk score ([place]., et al., 2013) failed to calculate for the following reasons:   The 2013 ASCVD risk score is only valid for ages 60 to 66   Patient has failed these meds in past: *** Patient is currently {CHL Controlled/Uncontrolled:[phone]} on the following medications:  . Rosuvastatin 10 mg 1 tablet daily  We discussed:  {CHL HP Upstream Pharmacy discussion:[phone]}  Plan  Continue {CHL HP Upstream Pharmacy [phone]}  Hx of stroke   Patient has failed these meds in past: *** Patient is currently {CHL Controlled/Uncontrolled:[phone]} on the following medications:  . Rosuvastatin 10 mg 1 tablet daily . Aspirin 325 mg 1 tablet daily  We discussed:  Monitoring for signs of bleeding such as unexplained and excessive bleeding from a cut or injury, easy or excessive bruising, blood in urine or stools, and nosebleeds without a known cause  Plan  Continue {CHL HP Upstream Pharmacy UTMLY:[id number]}  Depression   Depression screen [facility] 2/9 10/16/2019  07/18/2018 06/17/2018  Decreased Interest 1 0 1  Down, Depressed, Hopeless 1 0 0  PHQ - 2 Score 2 0 1  Altered sleeping 0 0 -  Tired, decreased energy 1 0 -  Change in appetite 0 0 -  Feeling bad or failure about yourself  0 0 -  Trouble concentrating 0 0 -  Moving slowly or fidgety/restless 0 0 -  Suicidal thoughts 0 0 -  PHQ-9 Score 3 0 -  Difficult doing work/chores Not difficult at all Not difficult at all -    Patient has failed these meds in past: *** Patient is currently {CHL Controlled/Uncontrolled:[phone]} on the following medications:  . Venlafaxine XR 75 mg 2 capsules daily  We discussed:  ***  Plan  Continue {CHL HP Upstream Pharmacy [phone]}  Osteopenia    Last DEXA Scan: 09/23/2012   T-Score femoral neck: -1.9  T-Score total hip: n/a  T-Score lumbar spine: n/a  T-Score forearm radius: n/a  10-year probability of major osteoporotic fracture: n/a  10-year probability of hip fracture: n/a  VITD  Date Value Ref Range  Status  03/18/2015 27.63 (L) 30.00 - 100.00 ng/mL Final     Patient is not a candidate for pharmacologic treatment  Patient has failed these meds in past: none Patient is currently {CHL Controlled/Uncontrolled:[phone]} on the following medications:  Marland Kitchen Vitamin D 2000 units 1 capsule daily .  Calcium?  We discussed:  Recommend [phone] units of vitamin D daily. Recommend 1200 mg of calcium daily from dietary and supplemental sources. Recommend weight-bearing and muscle strengthening exercises for building and maintaining bone density.  Plan  Continue {CHL HP Upstream Pharmacy MDYJW:[id number]}   ***   Patient has failed these meds in past: *** Patient is currently {CHL Controlled/Uncontrolled:[phone]} on the following medications:  . Estrace vaginal 1 applicatorful three times weekly  We discussed:  ***  Plan  Continue {CHL HP Upstream Pharmacy ZJQDU:[id number]}   Vitamins/supplements   Patient has failed these  meds in past: *** Patient is currently {CHL Controlled/Uncontrolled:[phone]} on the following medications:  . Probiotic 1 capsule daily . Biotin 10 mg 1 tablet daily . Apple cider vinegar 200 mg 1 capsule daily . Turmeric 1 tablet daily . Vitamin B12 1000 mcg 1 tablet daily . Vitamin C 500 mg 1 tablet daily . Vitamin E 100 unit 1 capsule daily . Zinc 15 mg 1 tablet daily . Magnesium?  We discussed:  ***  Plan  Continue {CHL HP Upstream Pharmacy FVOHK:[id number]}  Vaccines   Reviewed and discussed patient's vaccination history.    Immunization History  Administered Date(s) Administered  . Fluad Quad(high Dose 65+) 06/13/2019  . Influenza Split 06/18/2012  . Influenza Whole 07/11/2010  . Influenza, High Dose Seasonal PF 07/07/2013, 07/14/2014, 06/24/2015, 06/13/2016, 06/04/2017, 06/17/2018  . Influenza-Unspecified 06/22/2020  . Moderna SARS-COVID-2 Vaccination 09/14/2019, 10/12/2019  . Pneumococcal Conjugate-13 04/27/2014  . Pneumococcal Polysaccharide-23 10/15/2017  . Td 09/10/2005  . Zoster 11/29/2006    Plan  Recommended patient receive Shingrix and tetanus vaccine at pharmacy/in office.    Medication Management   Pt uses [facility] for all medications Uses pill box? {Yes or If no, why not?:20788} Pt endorses ***% compliance  We discussed: {Pharmacy options:24294}  Plan  {US Pharmacy CYEL:85909}    Follow up: *** month phone visit  Jeni Salles, PharmD Clinical Pharmacist [facility] [phone]

## 2020-07-14 ENCOUNTER — Ambulatory Visit: Payer: Medicare Other

## 2020-07-15 ENCOUNTER — Telehealth: Payer: Self-pay | Admitting: Family Medicine

## 2020-07-15 NOTE — Progress Notes (Signed)
  Chronic Care Management   Outreach Note  07/15/2020 Name: Sara Logan MRN: [id number] DOB: November 18, 1932  Referred by: Eulas Post, MD Reason for referral : No chief complaint on file.   An unsuccessful telephone outreach was attempted today. The patient was referred to the pharmacist for assistance with care management and care coordination.   Follow Up Plan:   Carley Perdue [employer] Scheduler

## 2020-07-18 ENCOUNTER — Telehealth (INDEPENDENT_AMBULATORY_CARE_PROVIDER_SITE_OTHER): Payer: Medicare Other | Admitting: Family Medicine

## 2020-07-18 ENCOUNTER — Encounter: Payer: Self-pay | Admitting: Family Medicine

## 2020-07-18 VITALS — Ht 62.0 in | Wt 167.0 lb

## 2020-07-18 DIAGNOSIS — R52 Pain, unspecified: Secondary | ICD-10-CM

## 2020-07-18 DIAGNOSIS — R197 Diarrhea, unspecified: Secondary | ICD-10-CM

## 2020-07-18 DIAGNOSIS — R6883 Chills (without fever): Secondary | ICD-10-CM | POA: Diagnosis not present

## 2020-07-18 NOTE — Progress Notes (Signed)
Patient ID: Sara Logan, female   DOB: 12/14/1932, 86 y.o.   MRN: [id number]  This visit type was conducted due to national recommendations for restrictions regarding the COVID-19 pandemic in an effort to limit this patient's exposure and mitigate transmission in our community.   Virtual Visit via Telephone Note  I connected with Jazzmon Prindle on 07/18/20 at  4:30 PM EST by telephone and verified that I am speaking with the correct person using two identifiers.   I discussed the limitations, risks, security and privacy concerns of performing an evaluation and management service by telephone and the availability of in person appointments. I also discussed with the patient that there may be a patient responsible charge related to this service. The patient expressed understanding and agreed to proceed.  Location patient: home Location provider: work or home office Participants present for the call: patient, provider Patient did not have a visit in the prior 7 days to address this/these issue(s).   History of Present Illness: Sara Logan called to discuss some symptoms she had basically last week.  Onset of symptoms was Wednesday.  She developed some body aches.  She initially was aware this was in her shoulders and neck but the symptoms have now resolved.  She has a mild headache.  She has some chills but no documented fever.  Denied any respiratory symptoms whatsoever such as cough or nasal congestion.  No vomiting or diarrhea initially.  No dysuria.  She took some Tylenol and her body aches seem to improve.  By later in the week into the weekend she had some mild diarrhea and took 1 dose of Imodium and has had none since then.  She has felt well since Sunday.  No history of diverticulitis.  She has abdominal wall hernia and feels like she may have had little bit of mild soreness with that.  Back to baseline at this time.  Good appetite.  Past Medical History:  Diagnosis Date  . Arthritis   .  Depression   . Hyperlipidemia   . Osteopenia   . Stroke ([place])   . Thrombocytopenia ([place])   . Vitamin D deficiency   . Wrist fracture 2002   left   Past Surgical History:  Procedure Laterality Date  . APPENDECTOMY  1967  . CARPAL TUNNEL RELEASE  2008   right  . JOINT REPLACEMENT  2002   right total     reports that she has never smoked. She has never used smokeless tobacco. She reports that she does not drink alcohol and does not use drugs. family history includes Breast cancer in her mother and sister. Allergies  Allergen Reactions  . Bactrim [Sulfamethoxazole-Trimethoprim] Nausea Only  . Ditropan [Oxybutynin]     Made patient feel bad      Observations/Objective: Patient sounds cheerful and well on the phone. I do not appreciate any SOB. Speech and thought processing are grossly intact. Patient reported vitals:  Assessment and Plan:  Patient had recent vague nonspecific symptoms of chills without documented fever, mild body aches, and one episode of diarrhea.  Symptoms completely resolved at this time.  She did not have any respiratory symptoms and low clinical suspicion for Covid.  She has been vaccinated.  -Follow-up promptly in office for any recurrent similar symptoms  Follow Up Instructions:  -As above   99441 5-10 99442 11-20 99443 21-30 I did not refer this patient for an OV in the next 24 hours for this/these issue(s).  I discussed the assessment  and treatment plan with the patient. The patient was provided an opportunity to ask questions and all were answered. The patient agreed with the plan and demonstrated an understanding of the instructions.   The patient was advised to call back or seek an in-person evaluation if the symptoms worsen or if the condition fails to improve as anticipated.  I provided 23 minutes of non-face-to-face time during this encounter.   Carolann Littler, MD

## 2020-07-19 DIAGNOSIS — Z23 Encounter for immunization: Secondary | ICD-10-CM | POA: Diagnosis not present

## 2020-08-08 DIAGNOSIS — H268 Other specified cataract: Secondary | ICD-10-CM | POA: Diagnosis not present

## 2020-08-08 DIAGNOSIS — Z961 Presence of intraocular lens: Secondary | ICD-10-CM | POA: Diagnosis not present

## 2020-08-26 DIAGNOSIS — R1310 Dysphagia, unspecified: Secondary | ICD-10-CM | POA: Diagnosis not present

## 2020-08-26 DIAGNOSIS — R2681 Unsteadiness on feet: Secondary | ICD-10-CM | POA: Diagnosis not present

## 2020-08-26 DIAGNOSIS — R41841 Cognitive communication deficit: Secondary | ICD-10-CM | POA: Diagnosis not present

## 2020-08-26 DIAGNOSIS — R1312 Dysphagia, oropharyngeal phase: Secondary | ICD-10-CM | POA: Diagnosis not present

## 2020-08-26 DIAGNOSIS — M6281 Muscle weakness (generalized): Secondary | ICD-10-CM | POA: Diagnosis not present

## 2020-08-26 DIAGNOSIS — R29898 Other symptoms and signs involving the musculoskeletal system: Secondary | ICD-10-CM | POA: Diagnosis not present

## 2020-08-26 DIAGNOSIS — Z9181 History of falling: Secondary | ICD-10-CM | POA: Diagnosis not present

## 2020-08-29 DIAGNOSIS — R29898 Other symptoms and signs involving the musculoskeletal system: Secondary | ICD-10-CM | POA: Diagnosis not present

## 2020-08-29 DIAGNOSIS — M6281 Muscle weakness (generalized): Secondary | ICD-10-CM | POA: Diagnosis not present

## 2020-08-29 DIAGNOSIS — Z9181 History of falling: Secondary | ICD-10-CM | POA: Diagnosis not present

## 2020-08-29 DIAGNOSIS — R1310 Dysphagia, unspecified: Secondary | ICD-10-CM | POA: Diagnosis not present

## 2020-08-29 DIAGNOSIS — R1312 Dysphagia, oropharyngeal phase: Secondary | ICD-10-CM | POA: Diagnosis not present

## 2020-08-29 DIAGNOSIS — R41841 Cognitive communication deficit: Secondary | ICD-10-CM | POA: Diagnosis not present

## 2020-08-31 ENCOUNTER — Ambulatory Visit (INDEPENDENT_AMBULATORY_CARE_PROVIDER_SITE_OTHER): Payer: Medicare Other | Admitting: Family Medicine

## 2020-08-31 ENCOUNTER — Encounter: Payer: Self-pay | Admitting: Family Medicine

## 2020-08-31 ENCOUNTER — Other Ambulatory Visit: Payer: Self-pay

## 2020-08-31 VITALS — BP 116/68 | HR 83 | Temp 98.1°F | Ht 62.0 in | Wt 159.0 lb

## 2020-08-31 DIAGNOSIS — R1084 Generalized abdominal pain: Secondary | ICD-10-CM | POA: Diagnosis not present

## 2020-08-31 DIAGNOSIS — R634 Abnormal weight loss: Secondary | ICD-10-CM | POA: Diagnosis not present

## 2020-08-31 DIAGNOSIS — R140 Abdominal distension (gaseous): Secondary | ICD-10-CM

## 2020-08-31 NOTE — Progress Notes (Signed)
Established Patient Office Visit  Subjective:  Patient ID: Sara Logan, female    DOB: 01/01/1933  Age: 86 y.o. MRN: [id number]  CC:  Chief Complaint  Patient presents with  . GI Problem    Digestive issues little diarrhea, stomach cramps that come and go poor appetite symptoms x 1 month.     HPI [name] presents for persistent GI complaints.  She has past history of hypertension, osteoarthritis, rosacea, history of recurrent depression, obesity.  She relates that she has frequent abdominal fullness after eating.  She has frequent bloating and sometimes abdominal distention.  Never associated with nausea or vomiting.  Her appetite is fair but she has had 8 pounds of unintentional weight loss since September.  She had labs back in August which were unremarkable.  She has early satiety.  She does have some intermittent diarrhea symptoms.  Never diagnosed with IBS.  Increased gas symptoms.  Denies any swallowing difficulties.  No known history of gluten intolerance or lactose intolerance.  She cannot correlate symptoms with any specific foods.  Her pain is somewhat diffuse and predominantly lower abdomen.  She sometimes describes as a cramp type pain.  Can last for several hours.  Denies any epigastric pain or right upper quadrant pain.  No recent fever.  No dysuria.  Diarrhea and loose stools are very intermittent  She is taken over-the-counter things like probiotics without any improvement.  Past Medical History:  Diagnosis Date  . Arthritis   . Depression   . Hyperlipidemia   . Osteopenia   . Stroke ([place])   . Thrombocytopenia ([place])   . Vitamin D deficiency   . Wrist fracture 2002   left    Past Surgical History:  Procedure Laterality Date  . APPENDECTOMY  1967  . CARPAL TUNNEL RELEASE  2008   right  . JOINT REPLACEMENT  2002   right total     Family History  Problem Relation Age of Onset  . Breast cancer Mother   . Breast cancer Sister     Social History    Socioeconomic History  . Marital status: Widowed    Spouse name: Not on file  . Number of children: 4  . Years of education: 16  . Highest education level: Bachelor's degree (e.g., BA, AB, BS)  Occupational History  . Not on file  Tobacco Use  . Smoking status: Never Smoker  . Smokeless tobacco: Never Used  Vaping Use  . Vaping Use: Never used  Substance and Sexual Activity  . Alcohol use: No  . Drug use: No  . Sexual activity: Not on file  Other Topics Concern  . Not on file  Social History Narrative   Lives in senior apartment at [facility]   widowed   Social Determinants of Health   Financial Resource Strain: [place]   . Difficulty of Paying Living Expenses: Not hard at all  Food Insecurity: No Food Insecurity  . Worried About Charity fundraiser in the Last Year: Never true  . Ran Out of Food in the Last Year: Never true  Transportation Needs: No Transportation Needs  . Lack of Transportation (Medical): No  . Lack of Transportation (Non-Medical): No  Physical Activity: Inactive  . Days of Exercise per Week: 0 days  . Minutes of Exercise per Session: 0 min  Stress: No Stress Concern Present  . Feeling of Stress : Only a little  Social Connections: Unknown  . Frequency of Communication with  Friends and Family: More than three times a week  . Frequency of Social Gatherings with Friends and Family: Once a week  . Attends Religious Services: Not on file  . Active Member of Clubs or Organizations: Yes  . Attends Archivist Meetings: Not on file  . Marital Status: Widowed  Intimate Partner Violence: Not on file    Outpatient Medications Prior to Visit  Medication Sig Dispense Refill  . amLODipine (NORVASC) 5 MG tablet TAKE 1 TABLET ONCE DAILY. 90 tablet 0  . aspirin EC 325 MG EC tablet Take 1 tablet (325 mg total) by mouth daily. 30 tablet 0  . Biotin 10 MG TABS Take 10 mg by mouth daily.     . Cholecalciferol (VITAMIN D3) 2000 units capsule 1  tablet daily.    Marland Kitchen estradiol (ESTRACE VAGINAL) 0.1 MG/GM vaginal cream Place 1 Applicatorful vaginally 3 (three) times a week. 42.5 g 12  . rosuvastatin (CRESTOR) 10 MG tablet TAKE 1 TABLET ONCE DAILY. 90 tablet 0  . Specialty Vitamins Products (MAGNESIUM, AMINO ACID CHELATE,) 133 MG tablet Take 1 tablet by mouth daily.    . TURMERIC PO Take by mouth daily.    Marland Kitchen venlafaxine XR (EFFEXOR-XR) 75 MG 24 hr capsule TAKE 2 CAPSULES (150MG ) BY MOUTH DAILY. 60 capsule 5  . vitamin B-12 (CYANOCOBALAMIN) 1000 MCG tablet daily.    . vitamin C (ASCORBIC ACID) 500 MG tablet Take 500 mg by mouth daily.    . vitamin E 100 UNIT capsule Take 100 Units by mouth daily.    . Zinc Sulfate (ZINC 15 PO) Take by mouth.    . Misc Natural Products (APPLE CIDER VINEGAR DIET PO) Take 200 mg by mouth. (Patient not taking: No sig reported)    . Probiotic Product (ALIGN PO) Take 1 capsule by mouth daily. (Patient not taking: No sig reported)     No facility-administered medications prior to visit.    Allergies  Allergen Reactions  . Bactrim [Sulfamethoxazole-Trimethoprim] Nausea Only  . Ditropan [Oxybutynin]     Made patient feel bad    ROS Review of Systems  Constitutional: Positive for fatigue and unexpected weight change. Negative for chills and fever.  Respiratory: Negative for shortness of breath.   Cardiovascular: Negative for chest pain.  Gastrointestinal: Positive for abdominal pain and diarrhea. Negative for blood in stool, nausea and vomiting.  Genitourinary: Negative for dysuria.      Objective:    Physical Exam Vitals reviewed.  Constitutional:      Appearance: She is obese.  Cardiovascular:     Rate and Rhythm: Normal rate and regular rhythm.  Pulmonary:     Effort: Pulmonary effort is normal.     Breath sounds: Normal breath sounds.  Abdominal:     General: Bowel sounds are normal. There is no distension.     Palpations: Abdomen is soft. There is no mass.     Tenderness: There is no  guarding or rebound.  Neurological:     Mental Status: She is alert.     BP 116/68   Pulse 83   Temp 98.1 F (36.7 C) (Oral)   Ht 5\' 2"  (1.575 m)   Wt 159 lb (72.1 kg)   SpO2 96%   BMI 29.08 kg/m  Wt Readings from Last 3 Encounters:  08/31/20 159 lb (72.1 kg)  07/18/20 167 lb (75.8 kg)  05/18/20 167 lb (75.8 kg)     Health Maintenance Due  Topic Date Due  . TETANUS/TDAP  09/11/2015  . COVID-19 Vaccine (3 - Booster for Moderna series) 04/10/2020    There are no preventive care reminders to display for this patient.  Lab Results  Component Value Date   TSH 1.93 04/26/2020   Lab Results  Component Value Date   WBC 7.9 04/26/2020   HGB 12.7 04/26/2020   HCT 39.0 04/26/2020   MCV 87.4 04/26/2020   PLT 155 04/26/2020   Lab Results  Component Value Date   NA 139 04/26/2020   K 3.8 04/26/2020   CO2 28 04/26/2020   GLUCOSE 98 04/26/2020   BUN 21 04/26/2020   CREATININE 0.67 04/26/2020   BILITOT 0.5 04/26/2020   ALKPHOS 72 02/10/2020   AST 15 04/26/2020   ALT 13 04/26/2020   PROT 6.9 04/26/2020   ALBUMIN 3.9 02/10/2020   CALCIUM 9.4 04/26/2020   ANIONGAP 6 06/07/2016   GFR 80.81 12/30/2019   Lab Results  Component Value Date   CHOL 136 02/10/2020   Lab Results  Component Value Date   HDL 42.60 02/10/2020   Lab Results  Component Value Date   LDLCALC 72 02/10/2020   Lab Results  Component Value Date   TRIG 107.0 02/10/2020   Lab Results  Component Value Date   CHOLHDL 3 02/10/2020   Lab Results  Component Value Date   HGBA1C 5.9 (H) 09/26/2015      Assessment & Plan:   Problem List Items Addressed This Visit   None   Visit Diagnoses    Weight loss    -  Primary   Relevant Orders   CT Abdomen Pelvis W Contrast   Abdominal pain, diffuse       Relevant Orders   CT Abdomen Pelvis W Contrast   Bloating       Abdominal distension (gaseous)       Relevant Orders   CT Abdomen Pelvis W Contrast    Patient presents with multiple  nonspecific symptoms as above including intermittent abdominal pain, unintentional weight loss, abdominal distention.  Given her age and duration of symptoms and unexplained weight loss set up CT abdomen pelvis to further assess.  We discussed possibly repeating some labs but she had these just a few months ago and these were stable.  She prefers to not get follow up labs yet.  Doubt gastroparesis.  She is not describing any postprandial nausea or vomiting.  We gave her handout on low FODMAP diet to focus on.  If CT unremarkable and symptoms persist consider other diagnoses such as possible small intestinal bacterial overgrowth syndrome or IBS  No orders of the defined types were placed in this encounter.   Follow-up: No follow-ups on file.    Carolann Littler, MD

## 2020-08-31 NOTE — Patient Instructions (Signed)
Low-FODMAP Eating Plan  FODMAPs (fermentable oligosaccharides, disaccharides, monosaccharides, and polyols) are sugars that are hard for some people to digest. A low-FODMAP eating plan may help some people who have bowel (intestinal) diseases to manage their symptoms. This meal plan can be complicated to follow. Work with a diet and nutrition specialist (dietitian) to make a low-FODMAP eating plan that is right for you. A dietitian can make sure that you get enough nutrition from this diet. What are tips for following this plan? Reading food labels  Check labels for hidden FODMAPs such as: ? High-fructose syrup. ? Honey. ? Agave. ? Natural fruit flavors. ? Onion or garlic powder.  Choose low-FODMAP foods that contain 3-4 grams of fiber per serving.  Check food labels for serving sizes. Eat only one serving at a time to make sure FODMAP levels stay low. Meal planning  Follow a low-FODMAP eating plan for up to 6 weeks, or as told by your health care provider or dietitian.  To follow the eating plan: 1. Eliminate high-FODMAP foods from your diet completely. 2. Gradually reintroduce high-FODMAP foods into your diet one at a time. Most people should wait a few days after introducing one high-FODMAP food before they introduce the next high-FODMAP food. Your dietitian can recommend how quickly you may reintroduce foods. 3. Keep a daily record of what you eat and drink, and make note of any symptoms that you have after eating. 4. Review your daily record with a dietitian regularly. Your dietitian can help you identify which foods you can eat and which foods you should avoid. General tips  Drink enough fluid each day to keep your urine pale yellow.  Avoid processed foods. These often have added sugar and may be high in FODMAPs.  Avoid most dairy products, whole grains, and sweeteners.  Work with a dietitian to make sure you get enough fiber in your diet. Recommended  foods Grains  Gluten-free grains, such as rice, oats, buckwheat, quinoa, corn, polenta, and millet. Gluten-free pasta, bread, or cereal. Rice noodles. Corn tortillas. Vegetables  Eggplant, zucchini, cucumber, peppers, green beans, Brussels sprouts, bean sprouts, lettuce, arugula, kale, Swiss chard, spinach, collard greens, bok choy, summer squash, potato, and tomato. Limited amounts of corn, carrot, and sweet potato. Green parts of scallions. Fruits  Bananas, oranges, lemons, limes, blueberries, raspberries, strawberries, grapes, cantaloupe, honeydew melon, kiwi, papaya, passion fruit, and pineapple. Limited amounts of dried cranberries, banana chips, and shredded coconut. Dairy  Lactose-free milk, yogurt, and kefir. Lactose-free cottage cheese and ice cream. Non-dairy milks, such as almond, coconut, hemp, and rice milk. Yogurts made of non-dairy milks. Limited amounts of goat cheese, brie, mozzarella, parmesan, swiss, and other hard cheeses. Meats and other protein foods  Unseasoned beef, pork, poultry, or fish. Eggs. Bacon. Tofu (firm) and tempeh. Limited amounts of nuts and seeds, such as almonds, walnuts, brazil nuts, pecans, peanuts, pumpkin seeds, chia seeds, and sunflower seeds. Fats and oils  Butter-free spreads. Vegetable oils, such as olive, canola, and sunflower oil. Seasoning and other foods  Artificial sweeteners with names that do not end in "ol" such as aspartame, saccharine, and stevia. Maple syrup, white table sugar, raw sugar, brown sugar, and molasses. Fresh basil, coriander, parsley, rosemary, and thyme. Beverages  Water and mineral water. Sugar-sweetened soft drinks. Small amounts of orange juice or cranberry juice. Black and green tea. Most dry wines. Coffee. This may not be a complete list of low-FODMAP foods. Talk with your dietitian for more information. Foods to avoid Grains  Wheat,   including kamut, durum, and semolina. Barley and bulgur. Couscous. Wheat-based  cereals. Wheat noodles, bread, crackers, and pastries. Vegetables  Chicory root, artichoke, asparagus, cabbage, snow peas, sugar snap peas, mushrooms, and cauliflower. Onions, garlic, leeks, and the white part of scallions. Fruits  Fresh, dried, and juiced forms of apple, pear, watermelon, peach, plum, cherries, apricots, blackberries, boysenberries, figs, nectarines, and mango. Avocado. Dairy  Milk, yogurt, ice cream, and soft cheese. Cream and sour cream. Milk-based sauces. Custard. Meats and other protein foods  Fried or fatty meat. Sausage. Cashews and pistachios. Soybeans, baked beans, black beans, chickpeas, kidney beans, fava beans, navy beans, lentils, and split peas. Seasoning and other foods  Any sugar-free gum or candy. Foods that contain artificial sweeteners such as sorbitol, mannitol, isomalt, or xylitol. Foods that contain honey, high-fructose corn syrup, or agave. Bouillon, vegetable stock, beef stock, and chicken stock. Garlic and onion powder. Condiments made with onion, such as hummus, chutney, pickles, relish, salad dressing, and salsa. Tomato paste. Beverages  Chicory-based drinks. Coffee substitutes. Chamomile tea. Fennel tea. Sweet or fortified wines such as port or sherry. Diet soft drinks made with isomalt, mannitol, maltitol, sorbitol, or xylitol. Apple, pear, and mango juice. Juices with high-fructose corn syrup. This may not be a complete list of high-FODMAP foods. Talk with your dietitian to discuss what dietary choices are best for you.  Summary  A low-FODMAP eating plan is a short-term diet that eliminates FODMAPs from your diet to help ease symptoms of certain bowel diseases.  The eating plan usually lasts up to 6 weeks. After that, high-FODMAP foods are restarted gradually, one at a time, so you can find out which may be causing symptoms.  A low-FODMAP eating plan can be complicated. It is best to work with a dietitian who has experience with this type of  plan. This information is not intended to replace advice given to you by your health care provider. Make sure you discuss any questions you have with your health care provider.  Will set up CT abdomen to further assess.  Document Revised: 08/09/2017 Document Reviewed: 04/23/2017 Elsevier Patient Education  [place].

## 2020-09-01 DIAGNOSIS — R41841 Cognitive communication deficit: Secondary | ICD-10-CM | POA: Diagnosis not present

## 2020-09-01 DIAGNOSIS — R1310 Dysphagia, unspecified: Secondary | ICD-10-CM | POA: Diagnosis not present

## 2020-09-01 DIAGNOSIS — M6281 Muscle weakness (generalized): Secondary | ICD-10-CM | POA: Diagnosis not present

## 2020-09-01 DIAGNOSIS — R1312 Dysphagia, oropharyngeal phase: Secondary | ICD-10-CM | POA: Diagnosis not present

## 2020-09-01 DIAGNOSIS — Z9181 History of falling: Secondary | ICD-10-CM | POA: Diagnosis not present

## 2020-09-01 DIAGNOSIS — R29898 Other symptoms and signs involving the musculoskeletal system: Secondary | ICD-10-CM | POA: Diagnosis not present

## 2020-09-05 ENCOUNTER — Telehealth: Payer: Self-pay | Admitting: Family Medicine

## 2020-09-05 NOTE — Telephone Encounter (Signed)
Pt is calling in stating that she has her CT scheduled for 09/22/2020 [facility]) and we should be recieving something after that about the appointment.

## 2020-09-06 DIAGNOSIS — R1310 Dysphagia, unspecified: Secondary | ICD-10-CM | POA: Diagnosis not present

## 2020-09-06 DIAGNOSIS — R1312 Dysphagia, oropharyngeal phase: Secondary | ICD-10-CM | POA: Diagnosis not present

## 2020-09-06 DIAGNOSIS — M6281 Muscle weakness (generalized): Secondary | ICD-10-CM | POA: Diagnosis not present

## 2020-09-06 DIAGNOSIS — Z9181 History of falling: Secondary | ICD-10-CM | POA: Diagnosis not present

## 2020-09-06 DIAGNOSIS — R29898 Other symptoms and signs involving the musculoskeletal system: Secondary | ICD-10-CM | POA: Diagnosis not present

## 2020-09-06 DIAGNOSIS — R41841 Cognitive communication deficit: Secondary | ICD-10-CM | POA: Diagnosis not present

## 2020-09-08 DIAGNOSIS — M6281 Muscle weakness (generalized): Secondary | ICD-10-CM | POA: Diagnosis not present

## 2020-09-08 DIAGNOSIS — R41841 Cognitive communication deficit: Secondary | ICD-10-CM | POA: Diagnosis not present

## 2020-09-08 DIAGNOSIS — R29898 Other symptoms and signs involving the musculoskeletal system: Secondary | ICD-10-CM | POA: Diagnosis not present

## 2020-09-08 DIAGNOSIS — R1312 Dysphagia, oropharyngeal phase: Secondary | ICD-10-CM | POA: Diagnosis not present

## 2020-09-08 DIAGNOSIS — R1310 Dysphagia, unspecified: Secondary | ICD-10-CM | POA: Diagnosis not present

## 2020-09-08 DIAGNOSIS — Z9181 History of falling: Secondary | ICD-10-CM | POA: Diagnosis not present

## 2020-09-12 DIAGNOSIS — R29898 Other symptoms and signs involving the musculoskeletal system: Secondary | ICD-10-CM | POA: Diagnosis not present

## 2020-09-12 DIAGNOSIS — M6281 Muscle weakness (generalized): Secondary | ICD-10-CM | POA: Diagnosis not present

## 2020-09-12 DIAGNOSIS — Z9181 History of falling: Secondary | ICD-10-CM | POA: Diagnosis not present

## 2020-09-12 DIAGNOSIS — R2681 Unsteadiness on feet: Secondary | ICD-10-CM | POA: Diagnosis not present

## 2020-09-12 DIAGNOSIS — R41841 Cognitive communication deficit: Secondary | ICD-10-CM | POA: Diagnosis not present

## 2020-09-12 DIAGNOSIS — R1312 Dysphagia, oropharyngeal phase: Secondary | ICD-10-CM | POA: Diagnosis not present

## 2020-09-12 DIAGNOSIS — R1310 Dysphagia, unspecified: Secondary | ICD-10-CM | POA: Diagnosis not present

## 2020-09-13 DIAGNOSIS — M6281 Muscle weakness (generalized): Secondary | ICD-10-CM | POA: Diagnosis not present

## 2020-09-13 DIAGNOSIS — R41841 Cognitive communication deficit: Secondary | ICD-10-CM | POA: Diagnosis not present

## 2020-09-13 DIAGNOSIS — R2681 Unsteadiness on feet: Secondary | ICD-10-CM | POA: Diagnosis not present

## 2020-09-13 DIAGNOSIS — R1310 Dysphagia, unspecified: Secondary | ICD-10-CM | POA: Diagnosis not present

## 2020-09-13 DIAGNOSIS — R1312 Dysphagia, oropharyngeal phase: Secondary | ICD-10-CM | POA: Diagnosis not present

## 2020-09-13 DIAGNOSIS — Z9181 History of falling: Secondary | ICD-10-CM | POA: Diagnosis not present

## 2020-09-14 DIAGNOSIS — H401421 Capsular glaucoma with pseudoexfoliation of lens, left eye, mild stage: Secondary | ICD-10-CM | POA: Diagnosis not present

## 2020-09-15 DIAGNOSIS — Z9181 History of falling: Secondary | ICD-10-CM | POA: Diagnosis not present

## 2020-09-15 DIAGNOSIS — R2681 Unsteadiness on feet: Secondary | ICD-10-CM | POA: Diagnosis not present

## 2020-09-15 DIAGNOSIS — R41841 Cognitive communication deficit: Secondary | ICD-10-CM | POA: Diagnosis not present

## 2020-09-15 DIAGNOSIS — R1310 Dysphagia, unspecified: Secondary | ICD-10-CM | POA: Diagnosis not present

## 2020-09-15 DIAGNOSIS — R1312 Dysphagia, oropharyngeal phase: Secondary | ICD-10-CM | POA: Diagnosis not present

## 2020-09-15 DIAGNOSIS — M6281 Muscle weakness (generalized): Secondary | ICD-10-CM | POA: Diagnosis not present

## 2020-09-17 ENCOUNTER — Other Ambulatory Visit: Payer: Self-pay | Admitting: Family Medicine

## 2020-09-20 ENCOUNTER — Telehealth: Payer: Medicare Other | Admitting: Family Medicine

## 2020-09-20 DIAGNOSIS — R1310 Dysphagia, unspecified: Secondary | ICD-10-CM | POA: Diagnosis not present

## 2020-09-20 DIAGNOSIS — R1312 Dysphagia, oropharyngeal phase: Secondary | ICD-10-CM | POA: Diagnosis not present

## 2020-09-20 DIAGNOSIS — R41841 Cognitive communication deficit: Secondary | ICD-10-CM | POA: Diagnosis not present

## 2020-09-20 DIAGNOSIS — R2681 Unsteadiness on feet: Secondary | ICD-10-CM | POA: Diagnosis not present

## 2020-09-20 DIAGNOSIS — Z9181 History of falling: Secondary | ICD-10-CM | POA: Diagnosis not present

## 2020-09-20 DIAGNOSIS — M6281 Muscle weakness (generalized): Secondary | ICD-10-CM | POA: Diagnosis not present

## 2020-09-22 ENCOUNTER — Ambulatory Visit
Admission: RE | Admit: 2020-09-22 | Discharge: 2020-09-22 | Disposition: A | Discharge status: Home / Self Care | Payer: Medicare Other | Source: Ambulatory Visit | Attending: Family Medicine | Admitting: Family Medicine

## 2020-09-22 ENCOUNTER — Telehealth: Payer: Self-pay | Admitting: *Deleted

## 2020-09-22 ENCOUNTER — Telehealth: Payer: Self-pay | Admitting: Family Medicine

## 2020-09-22 ENCOUNTER — Other Ambulatory Visit: Payer: Self-pay | Admitting: Adult Health

## 2020-09-22 DIAGNOSIS — R1084 Generalized abdominal pain: Secondary | ICD-10-CM

## 2020-09-22 DIAGNOSIS — R140 Abdominal distension (gaseous): Secondary | ICD-10-CM

## 2020-09-22 DIAGNOSIS — K5720 Diverticulitis of large intestine with perforation and abscess without bleeding: Secondary | ICD-10-CM

## 2020-09-22 DIAGNOSIS — R634 Abnormal weight loss: Secondary | ICD-10-CM

## 2020-09-22 DIAGNOSIS — K5732 Diverticulitis of large intestine without perforation or abscess without bleeding: Secondary | ICD-10-CM | POA: Diagnosis not present

## 2020-09-22 DIAGNOSIS — K5792 Diverticulitis of intestine, part unspecified, without perforation or abscess without bleeding: Secondary | ICD-10-CM

## 2020-09-22 MED ORDER — IOPAMIDOL (ISOVUE-370) INJECTION 76%
80.0000 mL | Freq: Once | INTRAVENOUS | Status: AC | PRN
  Administered 2020-09-22: 80 mL via INTRAVENOUS

## 2020-09-22 MED ORDER — CIPROFLOXACIN HCL 500 MG PO TABS: 500.0000 mg | ORAL_TABLET | Freq: Two times a day (BID) | ORAL | 0 refills | Status: AC

## 2020-09-22 MED ORDER — METRONIDAZOLE 500 MG PO TABS: 500.0000 mg | ORAL_TABLET | Freq: Three times a day (TID) | ORAL | 0 refills | Status: AC

## 2020-09-22 NOTE — Telephone Encounter (Signed)
CT scan shows moderate sigmoid diverticulitis with small diverticular abscesses  I have sent in two antibiotics.. Flagyl and Cipro for 14 days   She needs a close follow up with Dr. Elease Hashimoto Monday or Tuesday of next week.   I have also entered an order for a CBC that can be done when she comes in next week.

## 2020-09-22 NOTE — Telephone Encounter (Signed)
Pt would like a call from the nurse to go over her CT scan results when they are available.

## 2020-09-22 NOTE — Telephone Encounter (Signed)
Sara Logan called from [facility] (ph#[phone]) with a call report regarding a CT scan.  Sara Logan stated the CT scan of the abdomen and pelvis with contrast shows moderate sigmoid diverticulitis with small diverticular abscesses.  Message sent to Dorothyann Peng, NP as PCP is out of the office

## 2020-09-22 NOTE — Telephone Encounter (Signed)
Patient informed of the results and a follow up appt was scheduled for 1/17 with Dr Elease Hashimoto.

## 2020-09-23 NOTE — Telephone Encounter (Signed)
Discussed CT results with patient. She states she will start antibiotics today.

## 2020-09-23 NOTE — Telephone Encounter (Signed)
Noted  

## 2020-09-26 ENCOUNTER — Ambulatory Visit: Payer: Medicare Other | Admitting: Family Medicine

## 2020-09-27 DIAGNOSIS — R2681 Unsteadiness on feet: Secondary | ICD-10-CM | POA: Diagnosis not present

## 2020-09-27 DIAGNOSIS — Z9181 History of falling: Secondary | ICD-10-CM | POA: Diagnosis not present

## 2020-09-27 DIAGNOSIS — M6281 Muscle weakness (generalized): Secondary | ICD-10-CM | POA: Diagnosis not present

## 2020-09-27 DIAGNOSIS — R1310 Dysphagia, unspecified: Secondary | ICD-10-CM | POA: Diagnosis not present

## 2020-09-27 DIAGNOSIS — R41841 Cognitive communication deficit: Secondary | ICD-10-CM | POA: Diagnosis not present

## 2020-09-27 DIAGNOSIS — R1312 Dysphagia, oropharyngeal phase: Secondary | ICD-10-CM | POA: Diagnosis not present

## 2020-09-28 ENCOUNTER — Telehealth (INDEPENDENT_AMBULATORY_CARE_PROVIDER_SITE_OTHER): Payer: Medicare Other | Admitting: Family Medicine

## 2020-09-28 ENCOUNTER — Encounter: Payer: Self-pay | Admitting: Family Medicine

## 2020-09-28 DIAGNOSIS — K5720 Diverticulitis of large intestine with perforation and abscess without bleeding: Secondary | ICD-10-CM | POA: Diagnosis not present

## 2020-09-28 DIAGNOSIS — K5792 Diverticulitis of intestine, part unspecified, without perforation or abscess without bleeding: Secondary | ICD-10-CM

## 2020-09-28 NOTE — Progress Notes (Signed)
Patient ID: Sara Logan, female   DOB: 02/18/1933, 87 y.o.   MRN: [id number]  This visit type was conducted due to national recommendations for restrictions regarding the COVID-19 pandemic in an effort to limit this patient's exposure and mitigate transmission in our community.   Virtual Visit via Telephone Note  I connected with Sara Logan on 09/28/20 at 11:30 AM EST by telephone and verified that I am speaking with the correct person using two identifiers.   I discussed the limitations, risks, security and privacy concerns of performing an evaluation and management service by telephone and the availability of in person appointments. I also discussed with the patient that there may be a patient responsible charge related to this service. The patient expressed understanding and agreed to proceed.  Location patient: home Location provider: work or home office Participants present for the call: patient, provider Patient did not have a visit in the prior 7 days to address this/these issue(s).   History of Present Illness:  Sara Logan had really several months of vague GI symptoms and decreased appetite and somewhat poorly localized abdominal pain and weight loss. We ordered CT abdomen pelvis and this showed sigmoid diverticulitis with small abscess. No free air. She was placed promptly on Cipro and Flagyl and prescribed 14 days worth. She feels much better overall at this time. She has had some mild queasiness which may be related to the Flagyl. No alcohol use. No fever. No vomiting. Appetite is gradually improving. Pain is definitely improved. No prior history of diverticulitis. She has a daughter with Crohn's disease. We reviewed recent CT scan results with patient  She lives in assisted living complex and her meals are provided. She has been very restricted recently and what choices she has for eating because of this. She has been unable to get out to get groceries recently.  Past Medical  History:  Diagnosis Date  . Arthritis   . Depression   . Hyperlipidemia   . Osteopenia   . Stroke ([place])   . Thrombocytopenia ([place])   . Vitamin D deficiency   . Wrist fracture 2002   left   Past Surgical History:  Procedure Laterality Date  . APPENDECTOMY  1967  . CARPAL TUNNEL RELEASE  2008   right  . JOINT REPLACEMENT  2002   right total     reports that she has never smoked. She has never used smokeless tobacco. She reports that she does not drink alcohol and does not use drugs. family history includes Breast cancer in her mother and sister. Allergies  Allergen Reactions  . Bactrim [Sulfamethoxazole-Trimethoprim] Nausea Only  . Ditropan [Oxybutynin]     Made patient feel bad  . Sulfa Antibiotics     Other reaction(s): stomach upset      Observations/Objective: Patient sounds cheerful and well on the phone. I do not appreciate any SOB. Speech and thought processing are grossly intact. Patient reported vitals:  Assessment and Plan:  Acute diverticulitis sigmoid colon with small diverticular abscess without perforation. Patient clinically improved on Cipro and Flagyl.  -Continue full 14-day course -Discussed low residue diet for the next couple weeks and then resume regular diet. -Follow-up immediately for any fever or recurrent abdominal pain  Follow Up Instructions:  -As above   99441 5-10 99442 11-20 99443 21-30  I did not refer this patient for an OV in the next 24 hours for this/these issue(s).  I discussed the assessment and treatment plan with the patient. The patient was  provided an opportunity to ask questions and all were answered. The patient agreed with the plan and demonstrated an understanding of the instructions.   The patient was advised to call back or seek an in-person evaluation if the symptoms worsen or if the condition fails to improve as anticipated.  I provided 23 minutes of non-face-to-face time during this encounter.   Carolann Littler, MD

## 2020-09-28 NOTE — Patient Instructions (Signed)
Diverticulitis  Diverticulitis is infection or inflammation of small pouches (diverticula) in the colon that form due to a condition called diverticulosis. Diverticula can trap stool (feces) and bacteria, causing infection and inflammation. Diverticulitis may cause severe stomach pain and diarrhea. It may lead to tissue damage in the colon that causes bleeding or blockage. The diverticula may also burst (rupture) and cause infected stool to enter other areas of the abdomen. What are the causes? This condition is caused by stool becoming trapped in the diverticula, which allows bacteria to grow in the diverticula. This leads to inflammation and infection. What increases the risk? You are more likely to develop this condition if you have diverticulosis. The risk increases if you:  Are overweight or obese.  Do not get enough exercise.  Drink alcohol.  Use tobacco products.  Eat a diet that has a lot of red meat such as beef, pork, or lamb.  Eat a diet that does not include enough fiber. High-fiber foods include fruits, vegetables, beans, nuts, and whole grains.  Are over 40 years of age. What are the signs or symptoms? Symptoms of this condition may include:  Pain and tenderness in the abdomen. The pain is normally located on the left side of the abdomen, but it may occur in other areas.  Fever and chills.  Nausea.  Vomiting.  Cramping.  Bloating.  Changes in bowel routines.  Blood in your stool. How is this diagnosed? This condition is diagnosed based on:  Your medical history.  A physical exam.  Tests to make sure there is nothing else causing your condition. These tests may include: ? Blood tests. ? Urine tests. ? CT scan of the abdomen. How is this treated? Most cases of this condition are mild and can be treated at home. Treatment may include:  Taking over-the-counter pain medicines.  Following a clear liquid diet.  Taking antibiotic medicines by  mouth.  Resting. More severe cases may need to be treated at a hospital. Treatment may include:  Not eating or drinking.  Taking prescription pain medicine.  Receiving antibiotic medicines through an IV.  Receiving fluids and nutrition through an IV.  Surgery. When your condition is under control, your health care provider may recommend that you have a colonoscopy. This is an exam to look at the entire large intestine. During the exam, a lubricated, bendable tube is inserted into the anus and then passed into the rectum, colon, and other parts of the large intestine. A colonoscopy can show how severe your diverticula are and whether something else may be causing your symptoms. Follow these instructions at home: Medicines  Take over-the-counter and prescription medicines only as told by your health care provider. These include fiber supplements, probiotics, and stool softeners.  If you were prescribed an antibiotic medicine, take it as told by your health care provider. Do not stop taking the antibiotic even if you start to feel better.  Ask your health care provider if the medicine prescribed to you requires you to avoid driving or using machinery. Eating and drinking  Follow a full liquid diet or another diet as directed by your health care provider.  After your symptoms improve, your health care provider may tell you to change your diet. He or she may recommend that you eat a diet that contains at least 25 grams (25 g) of fiber daily. Fiber makes it easier to pass stool. Healthy sources of fiber include: ? Berries. One cup contains 4-8 grams of fiber. ? Beans   or lentils. One-half cup contains 5-8 grams of fiber. ? Green vegetables. One cup contains 4 grams of fiber.  Avoid eating red meat.   General instructions  Do not use any products that contain nicotine or tobacco, such as cigarettes, e-cigarettes, and chewing tobacco. If you need help quitting, ask your health care  provider.  Exercise for at least 30 minutes, 3 times each week. You should exercise hard enough to raise your heart rate and break a sweat.  Keep all follow-up visits as told by your health care provider. This is important. You may need to have a colonoscopy. Contact a health care provider if:  Your pain does not improve.  Your bowel movements do not return to normal. Get help right away if:  Your pain gets worse.  Your symptoms do not get better with treatment.  Your symptoms suddenly get worse.  You have a fever.  You vomit more than one time.  You have stools that are bloody, black, or tarry. Summary  Diverticulitis is infection or inflammation of small pouches (diverticula) in the colon that form due to a condition called diverticulosis. Diverticula can trap stool (feces) and bacteria, causing infection and inflammation.  You are at higher risk for this condition if you have diverticulosis and you eat a diet that does not include enough fiber.  Most cases of this condition are mild and can be treated at home. More severe cases may need to be treated at a hospital.  When your condition is under control, your health care provider may recommend that you have an exam called a colonoscopy. This exam can show how severe your diverticula are and whether something else may be causing your symptoms.  Keep all follow-up visits as told by your health care provider. This is important. This information is not intended to replace advice given to you by your health care provider. Make sure you discuss any questions you have with your health care provider. Document Revised: 06/08/2019 Document Reviewed: 06/08/2019 Elsevier Patient Education  2021 Elsevier Inc.  

## 2020-09-28 NOTE — Progress Notes (Signed)
Patient ID: Sara Logan, female   DOB: [birth year], 87 y.o.   MRN: [id number]

## 2020-09-29 DIAGNOSIS — Z9181 History of falling: Secondary | ICD-10-CM | POA: Diagnosis not present

## 2020-09-29 DIAGNOSIS — R41841 Cognitive communication deficit: Secondary | ICD-10-CM | POA: Diagnosis not present

## 2020-09-29 DIAGNOSIS — R1312 Dysphagia, oropharyngeal phase: Secondary | ICD-10-CM | POA: Diagnosis not present

## 2020-09-29 DIAGNOSIS — R1310 Dysphagia, unspecified: Secondary | ICD-10-CM | POA: Diagnosis not present

## 2020-09-29 DIAGNOSIS — M6281 Muscle weakness (generalized): Secondary | ICD-10-CM | POA: Diagnosis not present

## 2020-09-29 DIAGNOSIS — R2681 Unsteadiness on feet: Secondary | ICD-10-CM | POA: Diagnosis not present

## 2020-09-30 ENCOUNTER — Telehealth: Payer: Self-pay | Admitting: Family Medicine

## 2020-09-30 NOTE — Telephone Encounter (Signed)
Advised patient to try prunes and prune juice, increase fluids, try suppository and miralax as tolerated.

## 2020-09-30 NOTE — Telephone Encounter (Signed)
She is OK to use prunes or prune juice.  Increase fluids.  Should try suppository if Miralax not working.

## 2020-09-30 NOTE — Telephone Encounter (Signed)
Pt call and want dr.Burchette to give her a call back because she need some help from him.

## 2020-09-30 NOTE — Telephone Encounter (Signed)
Patient has not had a bowel movement in 3-4 days and is uncomfortable.  She has tried miralax but it did not stay down.  She has called to get some suppositories delivered from the pharmacy.  Advised patient to increase fluids and movement.  She asked the neighbor for prunes but is afraid to try them.  Wanted to know what else she could try?

## 2020-10-03 ENCOUNTER — Telehealth: Payer: Self-pay | Admitting: Family Medicine

## 2020-10-03 NOTE — Telephone Encounter (Signed)
Pt call and want a call back from dr.Burchette.

## 2020-10-03 NOTE — Telephone Encounter (Signed)
Spoke with patient.  She is feeling some better but follow up appointment scheduled.

## 2020-10-04 DIAGNOSIS — M6281 Muscle weakness (generalized): Secondary | ICD-10-CM | POA: Diagnosis not present

## 2020-10-04 DIAGNOSIS — R2681 Unsteadiness on feet: Secondary | ICD-10-CM | POA: Diagnosis not present

## 2020-10-04 DIAGNOSIS — R41841 Cognitive communication deficit: Secondary | ICD-10-CM | POA: Diagnosis not present

## 2020-10-04 DIAGNOSIS — Z9181 History of falling: Secondary | ICD-10-CM | POA: Diagnosis not present

## 2020-10-04 DIAGNOSIS — R1312 Dysphagia, oropharyngeal phase: Secondary | ICD-10-CM | POA: Diagnosis not present

## 2020-10-04 DIAGNOSIS — R1310 Dysphagia, unspecified: Secondary | ICD-10-CM | POA: Diagnosis not present

## 2020-10-06 DIAGNOSIS — R1312 Dysphagia, oropharyngeal phase: Secondary | ICD-10-CM | POA: Diagnosis not present

## 2020-10-06 DIAGNOSIS — R41841 Cognitive communication deficit: Secondary | ICD-10-CM | POA: Diagnosis not present

## 2020-10-06 DIAGNOSIS — M6281 Muscle weakness (generalized): Secondary | ICD-10-CM | POA: Diagnosis not present

## 2020-10-06 DIAGNOSIS — R1310 Dysphagia, unspecified: Secondary | ICD-10-CM | POA: Diagnosis not present

## 2020-10-06 DIAGNOSIS — Z9181 History of falling: Secondary | ICD-10-CM | POA: Diagnosis not present

## 2020-10-06 DIAGNOSIS — R2681 Unsteadiness on feet: Secondary | ICD-10-CM | POA: Diagnosis not present

## 2020-10-06 NOTE — Telephone Encounter (Signed)
FYI:  Pt is calling in stating that the Rx metronidazole (FLAGYL) 500 MG is making her unbalanced and disoriented and she has stopped taking it.  Pt stated that she cancel her appointment on 10/07/2020 due to transportation issues and will call back to r/s sometime next week when she figures out the transportation.

## 2020-10-07 ENCOUNTER — Ambulatory Visit: Payer: Medicare Other | Admitting: Family Medicine

## 2020-10-07 NOTE — Telephone Encounter (Signed)
Noted.  Should be OK for her to d/c the Metronidazole at this time if not tolerating.

## 2020-10-07 NOTE — Telephone Encounter (Signed)
Patient is now concerned that her sodium is low.  She is going to increase sodium intake.  Offered virtual visit but patient declined she wants to try to take care of herself on her own.  Appointment scheduled.

## 2020-10-10 ENCOUNTER — Other Ambulatory Visit: Payer: Self-pay

## 2020-10-11 ENCOUNTER — Encounter: Payer: Self-pay | Admitting: Family Medicine

## 2020-10-11 ENCOUNTER — Ambulatory Visit (INDEPENDENT_AMBULATORY_CARE_PROVIDER_SITE_OTHER): Payer: Medicare Other | Admitting: Family Medicine

## 2020-10-11 VITALS — BP 138/82 | HR 95 | Ht 62.0 in | Wt 151.0 lb

## 2020-10-11 DIAGNOSIS — R1310 Dysphagia, unspecified: Secondary | ICD-10-CM | POA: Diagnosis not present

## 2020-10-11 DIAGNOSIS — E785 Hyperlipidemia, unspecified: Secondary | ICD-10-CM | POA: Diagnosis not present

## 2020-10-11 DIAGNOSIS — I10 Essential (primary) hypertension: Secondary | ICD-10-CM

## 2020-10-11 DIAGNOSIS — K5732 Diverticulitis of large intestine without perforation or abscess without bleeding: Secondary | ICD-10-CM

## 2020-10-11 DIAGNOSIS — R41841 Cognitive communication deficit: Secondary | ICD-10-CM | POA: Diagnosis not present

## 2020-10-11 DIAGNOSIS — K5720 Diverticulitis of large intestine with perforation and abscess without bleeding: Secondary | ICD-10-CM

## 2020-10-11 DIAGNOSIS — R531 Weakness: Secondary | ICD-10-CM

## 2020-10-11 DIAGNOSIS — R29898 Other symptoms and signs involving the musculoskeletal system: Secondary | ICD-10-CM | POA: Diagnosis not present

## 2020-10-11 DIAGNOSIS — M6281 Muscle weakness (generalized): Secondary | ICD-10-CM | POA: Diagnosis not present

## 2020-10-11 DIAGNOSIS — R2681 Unsteadiness on feet: Secondary | ICD-10-CM | POA: Diagnosis not present

## 2020-10-11 DIAGNOSIS — R1312 Dysphagia, oropharyngeal phase: Secondary | ICD-10-CM | POA: Diagnosis not present

## 2020-10-11 DIAGNOSIS — Z9181 History of falling: Secondary | ICD-10-CM | POA: Diagnosis not present

## 2020-10-11 LAB — CBC WITH DIFFERENTIAL/PLATELET
Basophils Absolute: 0 10*3/uL (ref 0.0–0.1)
Basophils Relative: 0.4 % (ref 0.0–3.0)
Eosinophils Absolute: 0.1 10*3/uL (ref 0.0–0.7)
Eosinophils Relative: 1 % (ref 0.0–5.0)
HCT: 32.2 % — ABNORMAL LOW (ref 36.0–46.0)
Hemoglobin: 10.7 g/dL — ABNORMAL LOW (ref 12.0–15.0)
Lymphocytes Relative: 12 % (ref 12.0–46.0)
Lymphs Abs: 0.8 10*3/uL (ref 0.7–4.0)
MCHC: 33.3 g/dL (ref 30.0–36.0)
MCV: 82.8 fl (ref 78.0–100.0)
Monocytes Absolute: 0.9 10*3/uL (ref 0.1–1.0)
Monocytes Relative: 12.9 % — ABNORMAL HIGH (ref 3.0–12.0)
Neutro Abs: 5.2 10*3/uL (ref 1.4–7.7)
Neutrophils Relative %: 73.7 % (ref 43.0–77.0)
Platelets: 144 10*3/uL — ABNORMAL LOW (ref 150.0–400.0)
RBC: 3.89 Mil/uL (ref 3.87–5.11)
RDW: 16.1 % — ABNORMAL HIGH (ref 11.5–15.5)
WBC: 7 10*3/uL (ref 4.0–10.5)

## 2020-10-11 LAB — BASIC METABOLIC PANEL
BUN: 9 mg/dL (ref 6–23)
CO2: 33 mEq/L — ABNORMAL HIGH (ref 19–32)
Calcium: 8.8 mg/dL (ref 8.4–10.5)
Chloride: 97 mEq/L (ref 96–112)
Creatinine, Ser: 0.59 mg/dL (ref 0.40–1.20)
GFR: 81.83 mL/min (ref >60.00)
Glucose, Bld: 109 mg/dL — ABNORMAL HIGH (ref 70–99)
Potassium: 3.5 mEq/L (ref 3.5–5.1)
Sodium: 134 mEq/L — ABNORMAL LOW (ref 135–145)

## 2020-10-11 NOTE — Progress Notes (Signed)
Established Patient Office Visit  Subjective:  Patient ID: Sara Logan, female    DOB: [birth year]  Age: 87 y.o. MRN: [id number]  CC:  Chief Complaint  Patient presents with  . GI Problem    HPI SHAYLIN BLATT presents for generalized weakness.  She was recently diagnosed with acute sigmoid diverticulitis with small abscess.  Fortunately, there was no perforation.  She was placed on Cipro and Flagyl.  She had some difficulty taking Flagyl with nausea.  She has completed the antibiotics at this time.  She states her abdominal pain has resolved.  No fever.  Appetite has improved.  She feels she may not be consuming enough calories.  She lives at friend's home and they have 1 meal provided per day.  She has difficulties acquiring groceries as she does not any longer drive.  She will be requesting some assistance from friends home regarding that.  Ambulates with a walker.  She has had a couple of recent falls and her room when she was not using the walker.  She has physical therapy coming out from [facility] for some strengthening type exercises.  She has hypertension treated with amlodipine 5 mg daily.  No recent orthostatic symptoms.  Compliant with therapy.  She is on Crestor for hyperlipidemia with goal LDL less than 70 in view of her prior stroke.  She remains on aspirin.  Her main issue right now is generalized weakness following her sigmoid diverticulitis.  She has been somewhat less active physically.  She had TSH 5 months ago which was normal.  She thinks she may not be drinking quite enough fluids  Past Medical History:  Diagnosis Date  . Arthritis   . Depression   . Hyperlipidemia   . Osteopenia   . Stroke ([place])   . Thrombocytopenia ([place])   . Vitamin D deficiency   . Wrist fracture 2002   left    Past Surgical History:  Procedure Laterality Date  . APPENDECTOMY  1967  . CARPAL TUNNEL RELEASE  2008   right  . JOINT REPLACEMENT  2002   right total     Family History   Problem Relation Age of Onset  . Breast cancer Mother   . Breast cancer Sister     Social History   Socioeconomic History  . Marital status: Widowed    Spouse name: Not on file  . Number of children: 4  . Years of education: 16  . Highest education level: Bachelor's degree (e.g., BA, AB, BS)  Occupational History  . Not on file  Tobacco Use  . Smoking status: Never Smoker  . Smokeless tobacco: Never Used  Vaping Use  . Vaping Use: Never used  Substance and Sexual Activity  . Alcohol use: No  . Drug use: No  . Sexual activity: Not on file  Other Topics Concern  . Not on file  Social History Narrative   Lives in senior apartment at [facility]   widowed   Social Determinants of Health   Financial Resource Strain: [place]   . Difficulty of Paying Living Expenses: Not hard at all  Food Insecurity: No Food Insecurity  . Worried About Charity fundraiser in the Last Year: Never true  . Ran Out of Food in the Last Year: Never true  Transportation Needs: No Transportation Needs  . Lack of Transportation (Medical): No  . Lack of Transportation (Non-Medical): No  Physical Activity: Inactive  . Days of Exercise per Week: 0  days  . Minutes of Exercise per Session: 0 min  Stress: No Stress Concern Present  . Feeling of Stress : Only a little  Social Connections: Unknown  . Frequency of Communication with Friends and Family: More than three times a week  . Frequency of Social Gatherings with Friends and Family: Once a week  . Attends Religious Services: Not on file  . Active Member of Clubs or Organizations: Yes  . Attends Archivist Meetings: Not on file  . Marital Status: Widowed  Intimate Partner Violence: Not on file    Outpatient Medications Prior to Visit  Medication Sig Dispense Refill  . amLODipine (NORVASC) 5 MG tablet TAKE 1 TABLET ONCE DAILY. 90 tablet 0  . aspirin EC 325 MG EC tablet Take 1 tablet (325 mg total) by mouth daily. 30 tablet 0  .  Biotin 10 MG TABS Take 10 mg by mouth daily.     . Cholecalciferol (VITAMIN D3) 2000 units capsule 1 tablet daily.    Marland Kitchen estradiol (ESTRACE VAGINAL) 0.1 MG/GM vaginal cream Place 1 Applicatorful vaginally 3 (three) times a week. 42.5 g 12  . rosuvastatin (CRESTOR) 10 MG tablet TAKE 1 TABLET ONCE DAILY. 90 tablet 0  . Specialty Vitamins Products (MAGNESIUM, AMINO ACID CHELATE,) 133 MG tablet Take 1 tablet by mouth daily.    . TURMERIC PO Take by mouth daily.    Marland Kitchen venlafaxine XR (EFFEXOR-XR) 75 MG 24 hr capsule TAKE 2 CAPSULES (150MG ) BY MOUTH DAILY. 60 capsule 5  . vitamin B-12 (CYANOCOBALAMIN) 1000 MCG tablet daily.    . vitamin C (ASCORBIC ACID) 500 MG tablet Take 500 mg by mouth daily.    . vitamin E 100 UNIT capsule Take 100 Units by mouth daily.    . Zinc Sulfate (ZINC 15 PO) Take by mouth.     No facility-administered medications prior to visit.    Allergies  Allergen Reactions  . Bactrim [Sulfamethoxazole-Trimethoprim] Nausea Only  . Ditropan [Oxybutynin]     Made patient feel bad  . Sulfa Antibiotics     Other reaction(s): stomach upset    ROS Review of Systems  Constitutional: Positive for fatigue. Negative for fever and unexpected weight change.  Respiratory: Negative for cough and shortness of breath.   Cardiovascular: Negative for chest pain and leg swelling.  Gastrointestinal: Negative for abdominal pain.  Genitourinary: Negative for dysuria.  Neurological: Positive for weakness. Negative for headaches.  Psychiatric/Behavioral: Negative for confusion.      Objective:    Physical Exam Vitals reviewed.  Constitutional:      Appearance: Normal appearance.  Cardiovascular:     Rate and Rhythm: Normal rate and regular rhythm.  Pulmonary:     Effort: Pulmonary effort is normal.     Breath sounds: Normal breath sounds.  Abdominal:     Palpations: Abdomen is soft.     Tenderness: There is no abdominal tenderness. There is no guarding.  Neurological:     Mental  Status: She is alert.     BP 138/82   Pulse 95   Ht 5\' 2"  (1.575 m)   Wt 151 lb (68.5 kg)   SpO2 97%   BMI 27.62 kg/m  Wt Readings from Last 3 Encounters:  10/11/20 151 lb (68.5 kg)  08/31/20 159 lb (72.1 kg)  07/18/20 167 lb (75.8 kg)     Health Maintenance Due  Topic Date Due  . TETANUS/TDAP  09/11/2015  . COVID-19 Vaccine (3 - Booster for Moderna series) 04/10/2020  There are no preventive care reminders to display for this patient.  Lab Results  Component Value Date   TSH 1.93 04/26/2020   Lab Results  Component Value Date   WBC 7.9 04/26/2020   HGB 12.7 04/26/2020   HCT 39.0 04/26/2020   MCV 87.4 04/26/2020   PLT 155 04/26/2020   Lab Results  Component Value Date   NA 139 04/26/2020   K 3.8 04/26/2020   CO2 28 04/26/2020   GLUCOSE 98 04/26/2020   BUN 21 04/26/2020   CREATININE 0.67 04/26/2020   BILITOT 0.5 04/26/2020   ALKPHOS 72 02/10/2020   AST 15 04/26/2020   ALT 13 04/26/2020   PROT 6.9 04/26/2020   ALBUMIN 3.9 02/10/2020   CALCIUM 9.4 04/26/2020   ANIONGAP 6 06/07/2016   GFR 80.81 12/30/2019   Lab Results  Component Value Date   CHOL 136 02/10/2020   Lab Results  Component Value Date   HDL 42.60 02/10/2020   Lab Results  Component Value Date   LDLCALC 72 02/10/2020   Lab Results  Component Value Date   TRIG 107.0 02/10/2020   Lab Results  Component Value Date   CHOLHDL 3 02/10/2020   Lab Results  Component Value Date   HGBA1C 5.9 (H) 09/26/2015      Assessment & Plan:   #1 recent acute sigmoid diverticulitis with small abscess without perforation.  Clinically improved following course of Cipro and Flagyl. -Follow-up immediately for any recurrent fever or abdominal pain -Check CBC with differential  #2 generalized weakness.  Suspect related to deconditioning and less activity with recent illness. -Continue home physical therapy and she is encouraged to try to get up and move and ambulate more -We also suggest that  she try nutritional supplements such as boost or Ensure daily.  She has recently been consuming mostly just 1 meal per day.  Probably needs more protein and overall calorie intake.  #3 hypertension stable and at goal -Continue amlodipine 5 mg daily  #4 hyperlipidemia.  Goal LDL less than 70. -Continue Crestor and recheck lipids in 3 to 4 months  No orders of the defined types were placed in this encounter.   Follow-up: Return in about 3 months (around 01/08/2021).    Carolann Littler, MD

## 2020-10-11 NOTE — Patient Instructions (Signed)
Diverticulitis  Diverticulitis is infection or inflammation of small pouches (diverticula) in the colon that form due to a condition called diverticulosis. Diverticula can trap stool (feces) and bacteria, causing infection and inflammation. Diverticulitis may cause severe stomach pain and diarrhea. It may lead to tissue damage in the colon that causes bleeding or blockage. The diverticula may also burst (rupture) and cause infected stool to enter other areas of the abdomen. What are the causes? This condition is caused by stool becoming trapped in the diverticula, which allows bacteria to grow in the diverticula. This leads to inflammation and infection. What increases the risk? You are more likely to develop this condition if you have diverticulosis. The risk increases if you:  Are overweight or obese.  Do not get enough exercise.  Drink alcohol.  Use tobacco products.  Eat a diet that has a lot of red meat such as beef, pork, or lamb.  Eat a diet that does not include enough fiber. High-fiber foods include fruits, vegetables, beans, nuts, and whole grains.  Are over 40 years of age. What are the signs or symptoms? Symptoms of this condition may include:  Pain and tenderness in the abdomen. The pain is normally located on the left side of the abdomen, but it may occur in other areas.  Fever and chills.  Nausea.  Vomiting.  Cramping.  Bloating.  Changes in bowel routines.  Blood in your stool. How is this diagnosed? This condition is diagnosed based on:  Your medical history.  A physical exam.  Tests to make sure there is nothing else causing your condition. These tests may include: ? Blood tests. ? Urine tests. ? CT scan of the abdomen. How is this treated? Most cases of this condition are mild and can be treated at home. Treatment may include:  Taking over-the-counter pain medicines.  Following a clear liquid diet.  Taking antibiotic medicines by  mouth.  Resting. More severe cases may need to be treated at a hospital. Treatment may include:  Not eating or drinking.  Taking prescription pain medicine.  Receiving antibiotic medicines through an IV.  Receiving fluids and nutrition through an IV.  Surgery. When your condition is under control, your health care provider may recommend that you have a colonoscopy. This is an exam to look at the entire large intestine. During the exam, a lubricated, bendable tube is inserted into the anus and then passed into the rectum, colon, and other parts of the large intestine. A colonoscopy can show how severe your diverticula are and whether something else may be causing your symptoms. Follow these instructions at home: Medicines  Take over-the-counter and prescription medicines only as told by your health care provider. These include fiber supplements, probiotics, and stool softeners.  If you were prescribed an antibiotic medicine, take it as told by your health care provider. Do not stop taking the antibiotic even if you start to feel better.  Ask your health care provider if the medicine prescribed to you requires you to avoid driving or using machinery. Eating and drinking  Follow a full liquid diet or another diet as directed by your health care provider.  After your symptoms improve, your health care provider may tell you to change your diet. He or she may recommend that you eat a diet that contains at least 25 grams (25 g) of fiber daily. Fiber makes it easier to pass stool. Healthy sources of fiber include: ? Berries. One cup contains 4-8 grams of fiber. ? Beans   or lentils. One-half cup contains 5-8 grams of fiber. ? Green vegetables. One cup contains 4 grams of fiber.  Avoid eating red meat.   General instructions  Do not use any products that contain nicotine or tobacco, such as cigarettes, e-cigarettes, and chewing tobacco. If you need help quitting, ask your health care  provider.  Exercise for at least 30 minutes, 3 times each week. You should exercise hard enough to raise your heart rate and break a sweat.  Keep all follow-up visits as told by your health care provider. This is important. You may need to have a colonoscopy. Contact a health care provider if:  Your pain does not improve.  Your bowel movements do not return to normal. Get help right away if:  Your pain gets worse.  Your symptoms do not get better with treatment.  Your symptoms suddenly get worse.  You have a fever.  You vomit more than one time.  You have stools that are bloody, black, or tarry. Summary  Diverticulitis is infection or inflammation of small pouches (diverticula) in the colon that form due to a condition called diverticulosis. Diverticula can trap stool (feces) and bacteria, causing infection and inflammation.  You are at higher risk for this condition if you have diverticulosis and you eat a diet that does not include enough fiber.  Most cases of this condition are mild and can be treated at home. More severe cases may need to be treated at a hospital.  When your condition is under control, your health care provider may recommend that you have an exam called a colonoscopy. This exam can show how severe your diverticula are and whether something else may be causing your symptoms.  Keep all follow-up visits as told by your health care provider. This is important. This information is not intended to replace advice given to you by your health care provider. Make sure you discuss any questions you have with your health care provider. Document Revised: 06/08/2019 Document Reviewed: 06/08/2019 Elsevier Patient Education  2021 [employer].  Consider trial of Boost or Ensure as nutritional supplement

## 2020-10-12 ENCOUNTER — Telehealth: Payer: Self-pay

## 2020-10-12 NOTE — Telephone Encounter (Signed)
Left message for patient to call back regarding results.

## 2020-10-12 NOTE — Telephone Encounter (Signed)
-----   Message from Eulas Post, MD sent at 10/11/2020  9:22 PM EST ----- Electrolytes stable.  WBC normal.  Her hemoglobin has dropped from CBC in August of 2021.  ? Significance.  I recommend we repeat CBC in 3-4 weeks to assess stability.

## 2020-10-13 DIAGNOSIS — R41841 Cognitive communication deficit: Secondary | ICD-10-CM | POA: Diagnosis not present

## 2020-10-13 DIAGNOSIS — R2681 Unsteadiness on feet: Secondary | ICD-10-CM | POA: Diagnosis not present

## 2020-10-13 DIAGNOSIS — R1310 Dysphagia, unspecified: Secondary | ICD-10-CM | POA: Diagnosis not present

## 2020-10-13 DIAGNOSIS — R1312 Dysphagia, oropharyngeal phase: Secondary | ICD-10-CM | POA: Diagnosis not present

## 2020-10-13 DIAGNOSIS — M6281 Muscle weakness (generalized): Secondary | ICD-10-CM | POA: Diagnosis not present

## 2020-10-13 DIAGNOSIS — Z9181 History of falling: Secondary | ICD-10-CM | POA: Diagnosis not present

## 2020-10-13 NOTE — Telephone Encounter (Signed)
Pt returned the call to the office to get her lab results.

## 2020-10-14 DIAGNOSIS — R41841 Cognitive communication deficit: Secondary | ICD-10-CM | POA: Diagnosis not present

## 2020-10-14 DIAGNOSIS — R1312 Dysphagia, oropharyngeal phase: Secondary | ICD-10-CM | POA: Diagnosis not present

## 2020-10-14 DIAGNOSIS — Z9181 History of falling: Secondary | ICD-10-CM | POA: Diagnosis not present

## 2020-10-14 DIAGNOSIS — R2681 Unsteadiness on feet: Secondary | ICD-10-CM | POA: Diagnosis not present

## 2020-10-14 DIAGNOSIS — M6281 Muscle weakness (generalized): Secondary | ICD-10-CM | POA: Diagnosis not present

## 2020-10-14 DIAGNOSIS — R1310 Dysphagia, unspecified: Secondary | ICD-10-CM | POA: Diagnosis not present

## 2020-10-16 ENCOUNTER — Other Ambulatory Visit: Payer: Self-pay | Admitting: Family Medicine

## 2020-10-18 DIAGNOSIS — Z9181 History of falling: Secondary | ICD-10-CM | POA: Diagnosis not present

## 2020-10-18 DIAGNOSIS — R2681 Unsteadiness on feet: Secondary | ICD-10-CM | POA: Diagnosis not present

## 2020-10-18 DIAGNOSIS — R1312 Dysphagia, oropharyngeal phase: Secondary | ICD-10-CM | POA: Diagnosis not present

## 2020-10-18 DIAGNOSIS — R41841 Cognitive communication deficit: Secondary | ICD-10-CM | POA: Diagnosis not present

## 2020-10-18 DIAGNOSIS — R1310 Dysphagia, unspecified: Secondary | ICD-10-CM | POA: Diagnosis not present

## 2020-10-18 DIAGNOSIS — M6281 Muscle weakness (generalized): Secondary | ICD-10-CM | POA: Diagnosis not present

## 2020-10-20 DIAGNOSIS — R1310 Dysphagia, unspecified: Secondary | ICD-10-CM | POA: Diagnosis not present

## 2020-10-20 DIAGNOSIS — Z9181 History of falling: Secondary | ICD-10-CM | POA: Diagnosis not present

## 2020-10-20 DIAGNOSIS — M6281 Muscle weakness (generalized): Secondary | ICD-10-CM | POA: Diagnosis not present

## 2020-10-20 DIAGNOSIS — R41841 Cognitive communication deficit: Secondary | ICD-10-CM | POA: Diagnosis not present

## 2020-10-20 DIAGNOSIS — R2681 Unsteadiness on feet: Secondary | ICD-10-CM | POA: Diagnosis not present

## 2020-10-20 DIAGNOSIS — R1312 Dysphagia, oropharyngeal phase: Secondary | ICD-10-CM | POA: Diagnosis not present

## 2020-10-24 ENCOUNTER — Telehealth: Payer: Self-pay | Admitting: Family Medicine

## 2020-10-24 NOTE — Telephone Encounter (Signed)
Patient wanted to let us know she has started taking iron.  She is also drinking Boost.

## 2020-10-24 NOTE — Telephone Encounter (Signed)
Patient is requesting a call from Dr. Elease Hashimoto or a nurse.

## 2020-10-24 NOTE — Telephone Encounter (Signed)
Left message returning patient call.

## 2020-10-25 DIAGNOSIS — R1310 Dysphagia, unspecified: Secondary | ICD-10-CM | POA: Diagnosis not present

## 2020-10-25 DIAGNOSIS — R1312 Dysphagia, oropharyngeal phase: Secondary | ICD-10-CM | POA: Diagnosis not present

## 2020-10-25 DIAGNOSIS — Z9181 History of falling: Secondary | ICD-10-CM | POA: Diagnosis not present

## 2020-10-25 DIAGNOSIS — M6281 Muscle weakness (generalized): Secondary | ICD-10-CM | POA: Diagnosis not present

## 2020-10-25 DIAGNOSIS — R41841 Cognitive communication deficit: Secondary | ICD-10-CM | POA: Diagnosis not present

## 2020-10-25 DIAGNOSIS — R2681 Unsteadiness on feet: Secondary | ICD-10-CM | POA: Diagnosis not present

## 2020-10-26 DIAGNOSIS — R1312 Dysphagia, oropharyngeal phase: Secondary | ICD-10-CM | POA: Diagnosis not present

## 2020-10-26 DIAGNOSIS — R2681 Unsteadiness on feet: Secondary | ICD-10-CM | POA: Diagnosis not present

## 2020-10-26 DIAGNOSIS — R41841 Cognitive communication deficit: Secondary | ICD-10-CM | POA: Diagnosis not present

## 2020-10-26 DIAGNOSIS — Z9181 History of falling: Secondary | ICD-10-CM | POA: Diagnosis not present

## 2020-10-26 DIAGNOSIS — M6281 Muscle weakness (generalized): Secondary | ICD-10-CM | POA: Diagnosis not present

## 2020-10-26 DIAGNOSIS — R1310 Dysphagia, unspecified: Secondary | ICD-10-CM | POA: Diagnosis not present

## 2020-10-27 DIAGNOSIS — R1312 Dysphagia, oropharyngeal phase: Secondary | ICD-10-CM | POA: Diagnosis not present

## 2020-10-27 DIAGNOSIS — M6281 Muscle weakness (generalized): Secondary | ICD-10-CM | POA: Diagnosis not present

## 2020-10-27 DIAGNOSIS — R1310 Dysphagia, unspecified: Secondary | ICD-10-CM | POA: Diagnosis not present

## 2020-10-27 DIAGNOSIS — Z9181 History of falling: Secondary | ICD-10-CM | POA: Diagnosis not present

## 2020-10-27 DIAGNOSIS — R41841 Cognitive communication deficit: Secondary | ICD-10-CM | POA: Diagnosis not present

## 2020-10-27 DIAGNOSIS — R2681 Unsteadiness on feet: Secondary | ICD-10-CM | POA: Diagnosis not present

## 2020-11-01 DIAGNOSIS — Z9181 History of falling: Secondary | ICD-10-CM | POA: Diagnosis not present

## 2020-11-01 DIAGNOSIS — M6281 Muscle weakness (generalized): Secondary | ICD-10-CM | POA: Diagnosis not present

## 2020-11-01 DIAGNOSIS — R1312 Dysphagia, oropharyngeal phase: Secondary | ICD-10-CM | POA: Diagnosis not present

## 2020-11-01 DIAGNOSIS — R2681 Unsteadiness on feet: Secondary | ICD-10-CM | POA: Diagnosis not present

## 2020-11-01 DIAGNOSIS — R1310 Dysphagia, unspecified: Secondary | ICD-10-CM | POA: Diagnosis not present

## 2020-11-01 DIAGNOSIS — R41841 Cognitive communication deficit: Secondary | ICD-10-CM | POA: Diagnosis not present

## 2020-11-03 DIAGNOSIS — R41841 Cognitive communication deficit: Secondary | ICD-10-CM | POA: Diagnosis not present

## 2020-11-03 DIAGNOSIS — R2681 Unsteadiness on feet: Secondary | ICD-10-CM | POA: Diagnosis not present

## 2020-11-03 DIAGNOSIS — Z9181 History of falling: Secondary | ICD-10-CM | POA: Diagnosis not present

## 2020-11-03 DIAGNOSIS — R1310 Dysphagia, unspecified: Secondary | ICD-10-CM | POA: Diagnosis not present

## 2020-11-03 DIAGNOSIS — R1312 Dysphagia, oropharyngeal phase: Secondary | ICD-10-CM | POA: Diagnosis not present

## 2020-11-03 DIAGNOSIS — M6281 Muscle weakness (generalized): Secondary | ICD-10-CM | POA: Diagnosis not present

## 2020-11-08 DIAGNOSIS — R1312 Dysphagia, oropharyngeal phase: Secondary | ICD-10-CM | POA: Diagnosis not present

## 2020-11-08 DIAGNOSIS — R1310 Dysphagia, unspecified: Secondary | ICD-10-CM | POA: Diagnosis not present

## 2020-11-08 DIAGNOSIS — R29898 Other symptoms and signs involving the musculoskeletal system: Secondary | ICD-10-CM | POA: Diagnosis not present

## 2020-11-08 DIAGNOSIS — R41841 Cognitive communication deficit: Secondary | ICD-10-CM | POA: Diagnosis not present

## 2020-11-08 DIAGNOSIS — R2681 Unsteadiness on feet: Secondary | ICD-10-CM | POA: Diagnosis not present

## 2020-11-08 DIAGNOSIS — Z9181 History of falling: Secondary | ICD-10-CM | POA: Diagnosis not present

## 2020-11-08 DIAGNOSIS — M6281 Muscle weakness (generalized): Secondary | ICD-10-CM | POA: Diagnosis not present

## 2020-11-10 DIAGNOSIS — R29898 Other symptoms and signs involving the musculoskeletal system: Secondary | ICD-10-CM | POA: Diagnosis not present

## 2020-11-10 DIAGNOSIS — R41841 Cognitive communication deficit: Secondary | ICD-10-CM | POA: Diagnosis not present

## 2020-11-10 DIAGNOSIS — M6281 Muscle weakness (generalized): Secondary | ICD-10-CM | POA: Diagnosis not present

## 2020-11-10 DIAGNOSIS — R1312 Dysphagia, oropharyngeal phase: Secondary | ICD-10-CM | POA: Diagnosis not present

## 2020-11-10 DIAGNOSIS — R2681 Unsteadiness on feet: Secondary | ICD-10-CM | POA: Diagnosis not present

## 2020-11-10 DIAGNOSIS — Z9181 History of falling: Secondary | ICD-10-CM | POA: Diagnosis not present

## 2020-11-11 ENCOUNTER — Other Ambulatory Visit: Payer: Self-pay

## 2020-11-11 ENCOUNTER — Other Ambulatory Visit (INDEPENDENT_AMBULATORY_CARE_PROVIDER_SITE_OTHER): Payer: Medicare Other

## 2020-11-11 DIAGNOSIS — K5720 Diverticulitis of large intestine with perforation and abscess without bleeding: Secondary | ICD-10-CM | POA: Diagnosis not present

## 2020-11-11 DIAGNOSIS — K5792 Diverticulitis of intestine, part unspecified, without perforation or abscess without bleeding: Secondary | ICD-10-CM

## 2020-11-11 LAB — CBC WITH DIFFERENTIAL/PLATELET
Basophils Absolute: 0 10*3/uL (ref 0.0–0.1)
Basophils Relative: 0.4 % (ref 0.0–3.0)
Eosinophils Absolute: 0.1 10*3/uL (ref 0.0–0.7)
Eosinophils Relative: 1.5 % (ref 0.0–5.0)
HCT: 35.2 % — ABNORMAL LOW (ref 36.0–46.0)
Hemoglobin: 11.7 g/dL — ABNORMAL LOW (ref 12.0–15.0)
Lymphocytes Relative: 15.7 % (ref 12.0–46.0)
Lymphs Abs: 1.1 10*3/uL (ref 0.7–4.0)
MCHC: 33.2 g/dL (ref 30.0–36.0)
MCV: 85.2 fl (ref 78.0–100.0)
Monocytes Absolute: 0.8 10*3/uL (ref 0.1–1.0)
Monocytes Relative: 11 % (ref 3.0–12.0)
Neutro Abs: 5.1 10*3/uL (ref 1.4–7.7)
Neutrophils Relative %: 71.4 % (ref 43.0–77.0)
Platelets: 201 10*3/uL (ref 150.0–400.0)
RBC: 4.13 Mil/uL (ref 3.87–5.11)
RDW: 16.8 % — ABNORMAL HIGH (ref 11.5–15.5)
WBC: 7.2 10*3/uL (ref 4.0–10.5)

## 2020-11-14 DIAGNOSIS — R2681 Unsteadiness on feet: Secondary | ICD-10-CM | POA: Diagnosis not present

## 2020-11-14 DIAGNOSIS — M6281 Muscle weakness (generalized): Secondary | ICD-10-CM | POA: Diagnosis not present

## 2020-11-14 DIAGNOSIS — R41841 Cognitive communication deficit: Secondary | ICD-10-CM | POA: Diagnosis not present

## 2020-11-14 DIAGNOSIS — R1312 Dysphagia, oropharyngeal phase: Secondary | ICD-10-CM | POA: Diagnosis not present

## 2020-11-14 DIAGNOSIS — R29898 Other symptoms and signs involving the musculoskeletal system: Secondary | ICD-10-CM | POA: Diagnosis not present

## 2020-11-14 DIAGNOSIS — Z9181 History of falling: Secondary | ICD-10-CM | POA: Diagnosis not present

## 2020-11-16 DIAGNOSIS — R41841 Cognitive communication deficit: Secondary | ICD-10-CM | POA: Diagnosis not present

## 2020-11-16 DIAGNOSIS — Z9181 History of falling: Secondary | ICD-10-CM | POA: Diagnosis not present

## 2020-11-16 DIAGNOSIS — R29898 Other symptoms and signs involving the musculoskeletal system: Secondary | ICD-10-CM | POA: Diagnosis not present

## 2020-11-16 DIAGNOSIS — R1312 Dysphagia, oropharyngeal phase: Secondary | ICD-10-CM | POA: Diagnosis not present

## 2020-11-16 DIAGNOSIS — R2681 Unsteadiness on feet: Secondary | ICD-10-CM | POA: Diagnosis not present

## 2020-11-16 DIAGNOSIS — M6281 Muscle weakness (generalized): Secondary | ICD-10-CM | POA: Diagnosis not present

## 2020-11-18 DIAGNOSIS — R41841 Cognitive communication deficit: Secondary | ICD-10-CM | POA: Diagnosis not present

## 2020-11-18 DIAGNOSIS — R29898 Other symptoms and signs involving the musculoskeletal system: Secondary | ICD-10-CM | POA: Diagnosis not present

## 2020-11-18 DIAGNOSIS — Z9181 History of falling: Secondary | ICD-10-CM | POA: Diagnosis not present

## 2020-11-18 DIAGNOSIS — R2681 Unsteadiness on feet: Secondary | ICD-10-CM | POA: Diagnosis not present

## 2020-11-21 DIAGNOSIS — Z9181 History of falling: Secondary | ICD-10-CM | POA: Diagnosis not present

## 2020-11-21 DIAGNOSIS — R41841 Cognitive communication deficit: Secondary | ICD-10-CM | POA: Diagnosis not present

## 2020-11-21 DIAGNOSIS — R2681 Unsteadiness on feet: Secondary | ICD-10-CM | POA: Diagnosis not present

## 2020-11-21 DIAGNOSIS — R29898 Other symptoms and signs involving the musculoskeletal system: Secondary | ICD-10-CM | POA: Diagnosis not present

## 2020-11-28 DIAGNOSIS — R29898 Other symptoms and signs involving the musculoskeletal system: Secondary | ICD-10-CM | POA: Diagnosis not present

## 2020-11-28 DIAGNOSIS — R2681 Unsteadiness on feet: Secondary | ICD-10-CM | POA: Diagnosis not present

## 2020-11-28 DIAGNOSIS — H401421 Capsular glaucoma with pseudoexfoliation of lens, left eye, mild stage: Secondary | ICD-10-CM | POA: Diagnosis not present

## 2020-11-28 DIAGNOSIS — Z961 Presence of intraocular lens: Secondary | ICD-10-CM | POA: Diagnosis not present

## 2020-11-28 DIAGNOSIS — Z9181 History of falling: Secondary | ICD-10-CM | POA: Diagnosis not present

## 2020-11-28 DIAGNOSIS — R41841 Cognitive communication deficit: Secondary | ICD-10-CM | POA: Diagnosis not present

## 2020-11-28 DIAGNOSIS — H353131 Nonexudative age-related macular degeneration, bilateral, early dry stage: Secondary | ICD-10-CM | POA: Diagnosis not present

## 2020-11-29 DIAGNOSIS — R29898 Other symptoms and signs involving the musculoskeletal system: Secondary | ICD-10-CM | POA: Diagnosis not present

## 2020-11-29 DIAGNOSIS — R2681 Unsteadiness on feet: Secondary | ICD-10-CM | POA: Diagnosis not present

## 2020-11-29 DIAGNOSIS — Z9181 History of falling: Secondary | ICD-10-CM | POA: Diagnosis not present

## 2020-11-29 DIAGNOSIS — R41841 Cognitive communication deficit: Secondary | ICD-10-CM | POA: Diagnosis not present

## 2020-11-30 DIAGNOSIS — R2681 Unsteadiness on feet: Secondary | ICD-10-CM | POA: Diagnosis not present

## 2020-11-30 DIAGNOSIS — Z9181 History of falling: Secondary | ICD-10-CM | POA: Diagnosis not present

## 2020-11-30 DIAGNOSIS — R41841 Cognitive communication deficit: Secondary | ICD-10-CM | POA: Diagnosis not present

## 2020-11-30 DIAGNOSIS — R29898 Other symptoms and signs involving the musculoskeletal system: Secondary | ICD-10-CM | POA: Diagnosis not present

## 2020-12-02 ENCOUNTER — Inpatient Hospital Stay (HOSPITAL_COMMUNITY)
Admission: EM | Admit: 2020-12-02 | Discharge: 2020-12-15 | DRG: 329 | Disposition: A | Discharge status: Skilled Nursing Facility | Payer: Medicare Other | Attending: Surgery | Admitting: Surgery

## 2020-12-02 ENCOUNTER — Inpatient Hospital Stay (HOSPITAL_COMMUNITY): Payer: Medicare Other

## 2020-12-02 ENCOUNTER — Emergency Department (HOSPITAL_COMMUNITY): Payer: Medicare Other

## 2020-12-02 ENCOUNTER — Telehealth: Payer: Self-pay | Admitting: Family Medicine

## 2020-12-02 ENCOUNTER — Other Ambulatory Visit: Payer: Self-pay

## 2020-12-02 ENCOUNTER — Encounter (HOSPITAL_COMMUNITY): Payer: Self-pay

## 2020-12-02 DIAGNOSIS — R12 Heartburn: Secondary | ICD-10-CM | POA: Diagnosis not present

## 2020-12-02 DIAGNOSIS — K56601 Complete intestinal obstruction, unspecified as to cause: Secondary | ICD-10-CM

## 2020-12-02 DIAGNOSIS — D62 Acute posthemorrhagic anemia: Secondary | ICD-10-CM | POA: Diagnosis not present

## 2020-12-02 DIAGNOSIS — K56609 Unspecified intestinal obstruction, unspecified as to partial versus complete obstruction: Secondary | ICD-10-CM | POA: Diagnosis not present

## 2020-12-02 DIAGNOSIS — R110 Nausea: Secondary | ICD-10-CM | POA: Diagnosis not present

## 2020-12-02 DIAGNOSIS — E43 Unspecified severe protein-calorie malnutrition: Secondary | ICD-10-CM | POA: Diagnosis not present

## 2020-12-02 DIAGNOSIS — K5720 Diverticulitis of large intestine with perforation and abscess without bleeding: Secondary | ICD-10-CM

## 2020-12-02 DIAGNOSIS — Z933 Colostomy status: Secondary | ICD-10-CM | POA: Diagnosis not present

## 2020-12-02 DIAGNOSIS — Z66 Do not resuscitate: Secondary | ICD-10-CM | POA: Diagnosis not present

## 2020-12-02 DIAGNOSIS — Z881 Allergy status to other antibiotic agents status: Secondary | ICD-10-CM

## 2020-12-02 DIAGNOSIS — R1084 Generalized abdominal pain: Secondary | ICD-10-CM | POA: Diagnosis not present

## 2020-12-02 DIAGNOSIS — K5900 Constipation, unspecified: Secondary | ICD-10-CM | POA: Diagnosis not present

## 2020-12-02 DIAGNOSIS — K5732 Diverticulitis of large intestine without perforation or abscess without bleeding: Secondary | ICD-10-CM | POA: Diagnosis not present

## 2020-12-02 DIAGNOSIS — Z8673 Personal history of transient ischemic attack (TIA), and cerebral infarction without residual deficits: Secondary | ICD-10-CM | POA: Diagnosis not present

## 2020-12-02 DIAGNOSIS — R54 Age-related physical debility: Secondary | ICD-10-CM | POA: Diagnosis present

## 2020-12-02 DIAGNOSIS — K5792 Diverticulitis of intestine, part unspecified, without perforation or abscess without bleeding: Secondary | ICD-10-CM | POA: Diagnosis present

## 2020-12-02 DIAGNOSIS — K5651 Intestinal adhesions [bands], with partial obstruction: Secondary | ICD-10-CM | POA: Diagnosis not present

## 2020-12-02 DIAGNOSIS — K6389 Other specified diseases of intestine: Secondary | ICD-10-CM | POA: Diagnosis not present

## 2020-12-02 DIAGNOSIS — F32A Depression, unspecified: Secondary | ICD-10-CM | POA: Diagnosis present

## 2020-12-02 DIAGNOSIS — K567 Ileus, unspecified: Secondary | ICD-10-CM | POA: Diagnosis not present

## 2020-12-02 DIAGNOSIS — E785 Hyperlipidemia, unspecified: Secondary | ICD-10-CM | POA: Diagnosis not present

## 2020-12-02 DIAGNOSIS — Z20822 Contact with and (suspected) exposure to covid-19: Secondary | ICD-10-CM | POA: Diagnosis present

## 2020-12-02 DIAGNOSIS — R109 Unspecified abdominal pain: Secondary | ICD-10-CM | POA: Diagnosis not present

## 2020-12-02 DIAGNOSIS — K660 Peritoneal adhesions (postprocedural) (postinfection): Secondary | ICD-10-CM | POA: Diagnosis not present

## 2020-12-02 DIAGNOSIS — E860 Dehydration: Secondary | ICD-10-CM | POA: Diagnosis not present

## 2020-12-02 DIAGNOSIS — Z6824 Body mass index (BMI) 24.0-24.9, adult: Secondary | ICD-10-CM

## 2020-12-02 DIAGNOSIS — Z803 Family history of malignant neoplasm of breast: Secondary | ICD-10-CM | POA: Diagnosis not present

## 2020-12-02 DIAGNOSIS — Z4682 Encounter for fitting and adjustment of non-vascular catheter: Secondary | ICD-10-CM | POA: Diagnosis not present

## 2020-12-02 DIAGNOSIS — Z888 Allergy status to other drugs, medicaments and biological substances status: Secondary | ICD-10-CM

## 2020-12-02 DIAGNOSIS — R5381 Other malaise: Secondary | ICD-10-CM | POA: Diagnosis not present

## 2020-12-02 DIAGNOSIS — I69398 Other sequelae of cerebral infarction: Secondary | ICD-10-CM | POA: Diagnosis not present

## 2020-12-02 DIAGNOSIS — Z7982 Long term (current) use of aspirin: Secondary | ICD-10-CM

## 2020-12-02 DIAGNOSIS — K565 Intestinal adhesions [bands], unspecified as to partial versus complete obstruction: Secondary | ICD-10-CM | POA: Diagnosis present

## 2020-12-02 DIAGNOSIS — J9811 Atelectasis: Secondary | ICD-10-CM | POA: Diagnosis not present

## 2020-12-02 DIAGNOSIS — Z96651 Presence of right artificial knee joint: Secondary | ICD-10-CM | POA: Diagnosis present

## 2020-12-02 DIAGNOSIS — R0902 Hypoxemia: Secondary | ICD-10-CM | POA: Diagnosis not present

## 2020-12-02 DIAGNOSIS — R1032 Left lower quadrant pain: Secondary | ICD-10-CM | POA: Diagnosis not present

## 2020-12-02 DIAGNOSIS — Z882 Allergy status to sulfonamides status: Secondary | ICD-10-CM

## 2020-12-02 DIAGNOSIS — Z7401 Bed confinement status: Secondary | ICD-10-CM | POA: Diagnosis not present

## 2020-12-02 DIAGNOSIS — M1990 Unspecified osteoarthritis, unspecified site: Secondary | ICD-10-CM | POA: Diagnosis present

## 2020-12-02 DIAGNOSIS — Z01818 Encounter for other preprocedural examination: Secondary | ICD-10-CM

## 2020-12-02 DIAGNOSIS — E876 Hypokalemia: Secondary | ICD-10-CM | POA: Diagnosis not present

## 2020-12-02 DIAGNOSIS — K5669 Other partial intestinal obstruction: Secondary | ICD-10-CM | POA: Diagnosis not present

## 2020-12-02 DIAGNOSIS — Z79899 Other long term (current) drug therapy: Secondary | ICD-10-CM

## 2020-12-02 DIAGNOSIS — R140 Abdominal distension (gaseous): Secondary | ICD-10-CM

## 2020-12-02 DIAGNOSIS — M8580 Other specified disorders of bone density and structure, unspecified site: Secondary | ICD-10-CM | POA: Diagnosis not present

## 2020-12-02 DIAGNOSIS — I10 Essential (primary) hypertension: Secondary | ICD-10-CM | POA: Diagnosis not present

## 2020-12-02 DIAGNOSIS — R0602 Shortness of breath: Secondary | ICD-10-CM

## 2020-12-02 DIAGNOSIS — M2550 Pain in unspecified joint: Secondary | ICD-10-CM | POA: Diagnosis not present

## 2020-12-02 DIAGNOSIS — H35033 Hypertensive retinopathy, bilateral: Secondary | ICD-10-CM | POA: Diagnosis present

## 2020-12-02 DIAGNOSIS — R29898 Other symptoms and signs involving the musculoskeletal system: Secondary | ICD-10-CM | POA: Diagnosis not present

## 2020-12-02 DIAGNOSIS — K668 Other specified disorders of peritoneum: Secondary | ICD-10-CM | POA: Diagnosis not present

## 2020-12-02 DIAGNOSIS — R2681 Unsteadiness on feet: Secondary | ICD-10-CM | POA: Diagnosis not present

## 2020-12-02 DIAGNOSIS — Z4659 Encounter for fitting and adjustment of other gastrointestinal appliance and device: Secondary | ICD-10-CM

## 2020-12-02 DIAGNOSIS — M6281 Muscle weakness (generalized): Secondary | ICD-10-CM | POA: Diagnosis not present

## 2020-12-02 LAB — CBC WITH DIFFERENTIAL/PLATELET
Abs Immature Granulocytes: 0.08 10*3/uL — ABNORMAL HIGH (ref 0.00–0.07)
Basophils Absolute: 0 10*3/uL (ref 0.0–0.1)
Basophils Relative: 0 %
Eosinophils Absolute: 0 10*3/uL (ref 0.0–0.5)
Eosinophils Relative: 0 %
HCT: 40 % (ref 36.0–46.0)
Hemoglobin: 12.7 g/dL (ref 12.0–15.0)
Immature Granulocytes: 1 %
Lymphocytes Relative: 8 %
Lymphs Abs: 0.9 10*3/uL (ref 0.7–4.0)
MCH: 27.9 pg (ref 26.0–34.0)
MCHC: 31.8 g/dL (ref 30.0–36.0)
MCV: 87.9 fL (ref 80.0–100.0)
Monocytes Absolute: 0.8 10*3/uL (ref 0.1–1.0)
Monocytes Relative: 8 %
Neutro Abs: 9.3 10*3/uL — ABNORMAL HIGH (ref 1.7–7.7)
Neutrophils Relative %: 83 %
Platelets: 208 10*3/uL (ref 150–400)
RBC: 4.55 MIL/uL (ref 3.87–5.11)
RDW: 14.6 % (ref 11.5–15.5)
WBC: 11.1 10*3/uL — ABNORMAL HIGH (ref 4.0–10.5)
nRBC: 0 % (ref 0.0–0.2)

## 2020-12-02 LAB — COMPREHENSIVE METABOLIC PANEL
ALT: 11 U/L (ref 0–44)
AST: 13 U/L — ABNORMAL LOW (ref 15–41)
Albumin: 3 g/dL — ABNORMAL LOW (ref 3.5–5.0)
Alkaline Phosphatase: 60 U/L (ref 38–126)
Anion gap: 10 (ref 5–15)
BUN: 17 mg/dL (ref 8–23)
CO2: 28 mmol/L (ref 22–32)
Calcium: 8.8 mg/dL — ABNORMAL LOW (ref 8.9–10.3)
Chloride: 100 mmol/L (ref 98–111)
Creatinine, Ser: 0.57 mg/dL (ref 0.44–1.00)
GFR, Estimated: >60 mL/min (ref >60)
Glucose, Bld: 139 mg/dL — ABNORMAL HIGH (ref 70–99)
Potassium: 3.2 mmol/L — ABNORMAL LOW (ref 3.5–5.1)
Sodium: 138 mmol/L (ref 135–145)
Total Bilirubin: 0.9 mg/dL (ref 0.3–1.2)
Total Protein: 7.1 g/dL (ref 6.5–8.1)

## 2020-12-02 LAB — RESP PANEL BY RT-PCR (FLU A&B, COVID) ARPGX2
Influenza A by PCR: NEGATIVE
Influenza B by PCR: NEGATIVE
SARS Coronavirus 2 by RT PCR: NEGATIVE

## 2020-12-02 LAB — LIPASE, BLOOD: Lipase: 32 U/L (ref 11–51)

## 2020-12-02 LAB — MRSA PCR SCREENING: MRSA by PCR: NEGATIVE

## 2020-12-02 LAB — LACTIC ACID, PLASMA: Lactic Acid, Venous: 1.6 mmol/L (ref 0.5–1.9)

## 2020-12-02 MED ORDER — ONDANSETRON HCL 4 MG/2ML IJ SOLN
4.0000 mg | Freq: Four times a day (QID) | INTRAMUSCULAR | Status: DC | PRN
  Administered 2020-12-02 – 2020-12-03 (×2): 4 mg via INTRAVENOUS
  Filled 2020-12-02 (×2): qty 2

## 2020-12-02 MED ORDER — ENOXAPARIN SODIUM 40 MG/0.4ML ~~LOC~~ SOLN
40.0000 mg | SUBCUTANEOUS | Status: DC
  Administered 2020-12-02 – 2020-12-14 (×13): 40 mg via SUBCUTANEOUS
  Filled 2020-12-02 (×13): qty 0.4

## 2020-12-02 MED ORDER — PANTOPRAZOLE SODIUM 40 MG IV SOLR
40.0000 mg | Freq: Every day | INTRAVENOUS | Status: DC
  Administered 2020-12-02 – 2020-12-12 (×11): 40 mg via INTRAVENOUS
  Filled 2020-12-02 (×10): qty 40

## 2020-12-02 MED ORDER — DIPHENHYDRAMINE HCL 12.5 MG/5ML PO ELIX: 12.5000 mg | ORAL_SOLUTION | Freq: Four times a day (QID) | ORAL | Status: DC | PRN

## 2020-12-02 MED ORDER — HYDRALAZINE HCL 20 MG/ML IJ SOLN: 10.0000 mg | INTRAMUSCULAR | Status: DC | PRN

## 2020-12-02 MED ORDER — PIPERACILLIN-TAZOBACTAM 3.375 G IVPB
3.3750 g | Freq: Three times a day (TID) | INTRAVENOUS | Status: AC
  Administered 2020-12-02 – 2020-12-07 (×16): 3.375 g via INTRAVENOUS
  Filled 2020-12-02 (×16): qty 50

## 2020-12-02 MED ORDER — METOPROLOL TARTRATE 5 MG/5ML IV SOLN: 5.0000 mg | Freq: Four times a day (QID) | INTRAVENOUS | Status: DC | PRN

## 2020-12-02 MED ORDER — MORPHINE SULFATE (PF) 2 MG/ML IV SOLN
1.0000 mg | INTRAVENOUS | Status: DC | PRN
Start: 2020-12-02 — End: 2020-12-05

## 2020-12-02 MED ORDER — KCL IN DEXTROSE-NACL 20-5-0.45 MEQ/L-%-% IV SOLN
INTRAVENOUS | Status: DC
  Filled 2020-12-02 (×2): qty 1000

## 2020-12-02 MED ORDER — CHLORHEXIDINE GLUCONATE 0.12 % MT SOLN
15.0000 mL | Freq: Two times a day (BID) | OROMUCOSAL | Status: DC
  Administered 2020-12-02 – 2020-12-15 (×25): 15 mL via OROMUCOSAL
  Filled 2020-12-02 (×24): qty 15

## 2020-12-02 MED ORDER — ORAL CARE MOUTH RINSE
15.0000 mL | Freq: Two times a day (BID) | OROMUCOSAL | Status: DC
  Administered 2020-12-03 – 2020-12-15 (×24): 15 mL via OROMUCOSAL

## 2020-12-02 MED ORDER — DIPHENHYDRAMINE HCL 50 MG/ML IJ SOLN
12.5000 mg | Freq: Four times a day (QID) | INTRAMUSCULAR | Status: DC | PRN
Start: 2020-12-02 — End: 2020-12-13
  Administered 2020-12-03: 12.5 mg via INTRAVENOUS
  Filled 2020-12-02: qty 1

## 2020-12-02 MED ORDER — ONDANSETRON 4 MG PO TBDP
4.0000 mg | ORAL_TABLET | Freq: Four times a day (QID) | ORAL | Status: DC | PRN
  Administered 2020-12-14: 4 mg via ORAL
  Filled 2020-12-02: qty 1

## 2020-12-02 MED ORDER — PIPERACILLIN-TAZOBACTAM 3.375 G IVPB 30 MIN
3.3750 g | Freq: Once | INTRAVENOUS | Status: AC
  Administered 2020-12-02: 3.375 g via INTRAVENOUS
  Filled 2020-12-02: qty 50

## 2020-12-02 MED ORDER — CHLORHEXIDINE GLUCONATE CLOTH 2 % EX PADS
6.0000 | MEDICATED_PAD | Freq: Every day | CUTANEOUS | Status: DC
  Administered 2020-12-02 – 2020-12-06 (×4): 6 via TOPICAL

## 2020-12-02 MED ORDER — SODIUM CHLORIDE 0.9 % IV BOLUS
500.0000 mL | Freq: Once | INTRAVENOUS | Status: AC
  Administered 2020-12-02: 500 mL via INTRAVENOUS

## 2020-12-02 MED ORDER — ACETAMINOPHEN 10 MG/ML IV SOLN
1000.0000 mg | Freq: Four times a day (QID) | INTRAVENOUS | Status: AC
  Administered 2020-12-02 – 2020-12-03 (×4): 1000 mg via INTRAVENOUS
  Filled 2020-12-02 (×4): qty 100

## 2020-12-02 MED ORDER — IOHEXOL 300 MG/ML  SOLN
100.0000 mL | Freq: Once | INTRAMUSCULAR | Status: AC | PRN
  Administered 2020-12-02: 100 mL via INTRAVENOUS

## 2020-12-02 NOTE — ED Provider Notes (Addendum)
Care assumed from [place] at shift change, please see his note for full details, but in brief Sara Logan is a 87 y.o. female who presents with abdominal pain and distention.  Had been having some intermittent abdominal pain, this became more severe and persistent yesterday with associated nausea.  Also reports that she has not been able to eat much and has not been able to have a bowel movement.  Had diverticulitis in late January with an abscess, was treated by her PCP with outpatient antibiotics and she felt like this improved but has continued to have intermittent abdominal pains.  Lab work thus far has been reassuring with very mild leukocytosis, no significant electrolyte derangements and normal lactic acid.  CT pending at shift change.  Plan: Follow-up CT, high suspicion for diverticulitis.  Physical Exam  BP (!) 159/88 (BP Location: Right Arm)   Pulse (!) 108   Temp 98 F (36.7 C)   Resp (!) 21   Ht 5\' 3"  (1.6 m)   Wt 64 kg   SpO2 95%   BMI 24.98 kg/m   Physical Exam Vitals and nursing note reviewed.  Constitutional:      General: She is not in acute distress.    Appearance: She is well-developed. She is not diaphoretic.     Comments: Alert, pleasant and in no acute distress.  HENT:     Head: Normocephalic and atraumatic.  Eyes:     General:        Right eye: No discharge.        Left eye: No discharge.  Pulmonary:     Effort: Pulmonary effort is normal. No respiratory distress.  Abdominal:     General: There is distension.     Tenderness: There is generalized abdominal tenderness and tenderness in the left lower quadrant.     Comments: Abdomen is distended with some generalized tenderness, most severe focal tenderness in the left lower quadrant.  Skin:    General: Skin is warm and dry.  Neurological:     Mental Status: She is alert and oriented to person, place, and time.     Coordination: Coordination normal.  Psychiatric:        Mood and Affect: Mood  normal.        Behavior: Behavior normal.     ED Course/Procedures   Labs Reviewed  CBC WITH DIFFERENTIAL/PLATELET - Abnormal; Notable for the following components:      Result Value   WBC 11.1 (*)    Neutro Abs 9.3 (*)    Abs Immature Granulocytes 0.08 (*)    All other components within normal limits  COMPREHENSIVE METABOLIC PANEL - Abnormal; Notable for the following components:   Potassium 3.2 (*)    Glucose, Bld 139 (*)    Calcium 8.8 (*)    Albumin 3.0 (*)    AST 13 (*)    All other components within normal limits  RESP PANEL BY RT-PCR (FLU A&B, COVID) ARPGX2  LIPASE, BLOOD  LACTIC ACID, PLASMA  BASIC METABOLIC PANEL  CBC  PROTIME-INR  PREALBUMIN   CT ABDOMEN PELVIS W CONTRAST  Result Date: 12/02/2020 CLINICAL DATA:  Left lower quadrant abdominal pain. EXAM: CT ABDOMEN AND PELVIS WITH CONTRAST TECHNIQUE: Multidetector CT imaging of the abdomen and pelvis was performed using the standard protocol following bolus administration of intravenous contrast. CONTRAST:  134mL OMNIPAQUE IOHEXOL 300 MG/ML  SOLN COMPARISON:  September 22, 2020. FINDINGS: Lower chest: No acute abnormality. Hepatobiliary: No focal liver  abnormality is seen. No gallstones, gallbladder wall thickening, or biliary dilatation. Pancreas: Unremarkable. No pancreatic ductal dilatation or surrounding inflammatory changes. Spleen: Normal in size without focal abnormality. Adrenals/Urinary Tract: Adrenal glands are unremarkable. Kidneys are normal, without renal calculi, focal lesion, or hydronephrosis. Bladder is unremarkable. Stomach/Bowel: The stomach appears normal. There is severe dilatation of the large bowel as well as the distal small bowel, concerning for distal colonic obstruction. Severe wall thickening is seen involving the distal sigmoid colon consistent with diverticulitis. Wall thickening of the splenic flexure and descending colon is also noted with a possible suggestion of intramural gas suggesting  possible ischemic bowel. There is noted free air around the descending colon concerning for bowel perforation. Vascular/Lymphatic: Aortic atherosclerosis. No enlarged abdominal or pelvic lymph nodes. Reproductive: Uterus and bilateral adnexa are unremarkable. Other: No abdominal wall hernia or abnormality. Small amount of free fluid is noted around the liver. Musculoskeletal: No acute or significant osseous findings. IMPRESSION: 1. Severe wall thickening is seen involving the distal sigmoid colon consistent with diverticulitis. There is noted free air around the descending colon concerning for bowel perforation. Wall thickening of the splenic flexure and descending colon is also noted with possible intramural gas suggesting possible ischemic bowel. Severe dilatation of the large bowel as well as the distal small bowel is noted, concerning for distal colonic obstruction. Critical Value/emergent results were called by telephone at the time of interpretation on 12/02/2020 at 3:56 pm to provider Benedetto Goad, who verbally acknowledged these results. 2. Aortic atherosclerosis. Aortic Atherosclerosis (ICD10-I70.0). Electronically Signed   By: Marijo Conception M.D.   On: 12/02/2020 15:58     .Critical Care Performed by: Jacqlyn Larsen, PA-C Authorized by: Jacqlyn Larsen, PA-C   Critical care provider statement:    Critical care time (minutes):  45   Critical care was necessary to treat or prevent imminent or life-threatening deterioration of the following conditions: Diverticulitis with associated perforation, obstruction and concern for ischemic bowel.   Critical care was time spent personally by me on the following activities:  Discussions with consultants, evaluation of patient's response to treatment, examination of patient, ordering and performing treatments and interventions, ordering and review of laboratory studies, ordering and review of radiographic studies, pulse oximetry, re-evaluation of patient's  condition, obtaining history from patient or surrogate and review of old charts    MDM   3:56 PM called by radiology regarding CT results, there is severe wall thickening in the distal sigmoid colon consistent with diverticulitis and there is noted free air around the descending colon concerning for bowel perforation, there is also wall thickening of the splenic flexure and descending colon with possible intramural gas concerning for ischemic bowel.  There is severe dilatation of the large bowel as well as the distal small bowel noted concerning for distal colonic obstruction.  On repeat exam after CT results patient with distended abdomen with some mild generalized tenderness that is most severe in the left lower quadrant.  Patient has become mildly tachycardic but vitals otherwise remained stable and at rest she is not in significant pain.  Despite very concerning CT patient's lab work is reassuring with normal lactic acid.  Will give fluid bolus and IV Zosyn.  Consult placed to general surgery and I discussed case with PA Claiborne Billings Reyburn who will be down shortly to see patient.  Concern patient will likely need to go to the OR.  I called and updated patient's daughter Sara Logan who lives in [place], but is  the patient's closest relative.  The general surgery team has called and updated her as well.  She will plan to come into town early tomorrow morning.  General surgery plans to admit patient to their service with plans to go to the OR in the morning.   Final diagnoses:  Diverticulitis of large intestine with perforation and abscess without bleeding  Complete intestinal obstruction, unspecified cause [facility])      Jacqlyn Larsen, PA-C 12/02/20 1745    Jacqlyn Larsen, PA-C 12/02/20 1746    Merryl Hacker, MD 12/02/20 [phone]

## 2020-12-02 NOTE — ED Notes (Signed)
Patient attached to external female catheter/ clean/ dry/ intact/ suction canister

## 2020-12-02 NOTE — H&P (Signed)
Admission Note  Sara Logan 1932/10/21  [id number].    Requesting MD: Benedetto Goad PA-C Chief Complaint/Reason for Consult: Diverticulitis  HPI:  Patient is an 87 year old female who presented today with more severe abdominal pain that started yesterday morning in her LLQ. She has not been able to eat or drink since supper Wednesday. She had her first episode of diverticulitis in January of this year and was managed as an outpatient. She completed course of cipro/flagyl. She reports she got slightly improved from that episode but has never fully improved since this episode. She frequently gets full after only eating a little bit. She has had intermittent crampy abdominal pain. She has been struggling with constipation. She had some loose stool this AM after a suppository. She is passing flatus but infrequently. She has not been running a fever. Denies chest pain or SOB. She reports progressive decline over the last 3 months. She was living in independent living at [facility] and recently transitioned to assisted living in the last 2 weeks. 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 She has 3 children, one of which is planning on coming tomorrow. Patient is not on any blood thinning medications. Past abdominal surgery includes a tubal ligation with appendectomy via low midline incision.   ROS: Review of Systems  Constitutional: Positive for malaise/fatigue and weight loss. Negative for chills and fever.  Respiratory: Negative for shortness of breath and wheezing.   Cardiovascular: Negative for chest pain and palpitations.  Gastrointestinal: Positive for abdominal pain, constipation and nausea. Negative for blood in stool, diarrhea, melena and vomiting.  Genitourinary: Negative for dysuria, frequency and urgency.  Neurological: Positive for weakness.  All other systems reviewed and are negative.   Family History  Problem Relation  Age of Onset  . Breast cancer Mother   . Breast cancer Sister     Past Medical History:  Diagnosis Date  . Arthritis   . Depression   . Hyperlipidemia   . Osteopenia   . Stroke ([place])   . Thrombocytopenia ([place])   . Vitamin D deficiency   . Wrist fracture 2002   left    Past Surgical History:  Procedure Laterality Date  . APPENDECTOMY  1967  . CARPAL TUNNEL RELEASE  2008   right  . JOINT REPLACEMENT  2002   right total     Social History:  reports that she has never smoked. She has never used smokeless tobacco. She reports that she does not drink alcohol and does not use drugs.  Allergies:  Allergies  Allergen Reactions  . Bactrim [Sulfamethoxazole-Trimethoprim] Nausea Only  . Ditropan [Oxybutynin]     Made patient feel bad  . Sulfa Antibiotics     Other reaction(s): stomach upset    (Not in a hospital admission)   Blood pressure (!) 159/88, pulse (!) 108, temperature 98 F (36.7 C), resp. rate (!) 21, height 5\' 3"  (1.6 m), weight 64 kg, SpO2 95 %. Physical Exam:  General: pleasant, WD, elderly female who is laying in bed in NAD HEENT: head is normocephalic, atraumatic.  Sclera are noninjected.  PERRL.  Ears and nose without any masses or lesions.  Mouth is pink and dry  Heart: regular, tachycardia in the low 100s.  Normal s1,s2. No obvious murmurs, gallops, or rubs noted.  Palpable radial and pedal pulses bilaterally Lungs: CTAB, no wheezes, rhonchi, or rales noted.  Respiratory effort nonlabored Abd: soft, ttp  in LLQ without peritonitis, very distended, +BS, low midline surgical scar MS: all 4 extremities are symmetrical with no cyanosis, clubbing, or edema. Skin: warm and dry with no masses, lesions, or rashes Neuro: Cranial nerves 2-12 grossly intact, sensation is normal throughout Psych: A&Ox3 with an appropriate affect.   Results for orders placed or performed during the hospital encounter of 12/02/20 (from the past 48 hour(s))  CBC with  Differential/Platelet     Status: Abnormal   Collection Time: 12/02/20 11:43 AM  Result Value Ref Range   WBC 11.1 (H) 4.0 - 10.5 K/uL   RBC 4.55 3.87 - 5.11 MIL/uL   Hemoglobin 12.7 12.0 - 15.0 g/dL   HCT 40.0 36.0 - 46.0 %   MCV 87.9 80.0 - 100.0 fL   MCH 27.9 26.0 - 34.0 pg   MCHC 31.8 30.0 - 36.0 g/dL   RDW 14.6 11.5 - 15.5 %   Platelets 208 150 - 400 K/uL   nRBC 0.0 0.0 - 0.2 %   Neutrophils Relative % 83 %   Neutro Abs 9.3 (H) 1.7 - 7.7 K/uL   Lymphocytes Relative 8 %   Lymphs Abs 0.9 0.7 - 4.0 K/uL   Monocytes Relative 8 %   Monocytes Absolute 0.8 0.1 - 1.0 K/uL   Eosinophils Relative 0 %   Eosinophils Absolute 0.0 0.0 - 0.5 K/uL   Basophils Relative 0 %   Basophils Absolute 0.0 0.0 - 0.1 K/uL   Immature Granulocytes 1 %   Abs Immature Granulocytes 0.08 (H) 0.00 - 0.07 K/uL    Comment: Performed at [facility], [place] [address] [place], [place] [address]  Lipase, blood     Status: None   Collection Time: 12/02/20 11:43 AM  Result Value Ref Range   Lipase 32 11 - 51 U/L    Comment: Performed at [facility], [place] [address] [place], [place] [address]  Comprehensive metabolic panel     Status: Abnormal   Collection Time: 12/02/20 11:43 AM  Result Value Ref Range   Sodium 138 135 - 145 mmol/L   Potassium 3.2 (L) 3.5 - 5.1 mmol/L   Chloride 100 98 - 111 mmol/L   CO2 28 22 - 32 mmol/L   Glucose, Bld 139 (H) 70 - 99 mg/dL    Comment: Glucose reference range applies only to samples taken after fasting for at least 8 hours.   BUN 17 8 - 23 mg/dL   Creatinine, Ser 0.57 0.44 - 1.00 mg/dL   Calcium 8.8 (L) 8.9 - 10.3 mg/dL   Total Protein 7.1 6.5 - 8.1 g/dL   Albumin 3.0 (L) 3.5 - 5.0 g/dL   AST 13 (L) 15 - 41 U/L   ALT 11 0 - 44 U/L   Alkaline Phosphatase 60 38 - 126 U/L   Total Bilirubin 0.9 0.3 - 1.2 mg/dL   GFR, Estimated >60 >60 mL/min    Comment: (NOTE) Calculated using the CKD-EPI Creatinine Equation (2021)    Anion gap 10  5 - 15    Comment: Performed at [facility], [place] [address], [place], [place] [address]  Lactic acid, plasma     Status: None   Collection Time: 12/02/20  1:04 PM  Result Value Ref Range   Lactic Acid, Venous 1.6 0.5 - 1.9 mmol/L    Comment: Performed at [facility], [place] [address] [place], [place] [address]   CT ABDOMEN PELVIS W CONTRAST  Result Date: 12/02/2020 CLINICAL  DATA:  Left lower quadrant abdominal pain. EXAM: CT ABDOMEN AND PELVIS WITH CONTRAST TECHNIQUE: Multidetector CT imaging of the abdomen and pelvis was performed using the standard protocol following bolus administration of intravenous contrast. CONTRAST:  162mL OMNIPAQUE IOHEXOL 300 MG/ML  SOLN COMPARISON:  September 22, 2020. FINDINGS: Lower chest: No acute abnormality. Hepatobiliary: No focal liver abnormality is seen. No gallstones, gallbladder wall thickening, or biliary dilatation. Pancreas: Unremarkable. No pancreatic ductal dilatation or surrounding inflammatory changes. Spleen: Normal in size without focal abnormality. Adrenals/Urinary Tract: Adrenal glands are unremarkable. Kidneys are normal, without renal calculi, focal lesion, or hydronephrosis. Bladder is unremarkable. Stomach/Bowel: The stomach appears normal. There is severe dilatation of the large bowel as well as the distal small bowel, concerning for distal colonic obstruction. Severe wall thickening is seen involving the distal sigmoid colon consistent with diverticulitis. Wall thickening of the splenic flexure and descending colon is also noted with a possible suggestion of intramural gas suggesting possible ischemic bowel. There is noted free air around the descending colon concerning for bowel perforation. Vascular/Lymphatic: Aortic atherosclerosis. No enlarged abdominal or pelvic lymph nodes. Reproductive: Uterus and bilateral adnexa are unremarkable. Other: No abdominal wall hernia or abnormality. Small amount of free fluid is  noted around the liver. Musculoskeletal: No acute or significant osseous findings. IMPRESSION: 1. Severe wall thickening is seen involving the distal sigmoid colon consistent with diverticulitis. There is noted free air around the descending colon concerning for bowel perforation. Wall thickening of the splenic flexure and descending colon is also noted with possible intramural gas suggesting possible ischemic bowel. Severe dilatation of the large bowel as well as the distal small bowel is noted, concerning for distal colonic obstruction. Critical Value/emergent results were called by telephone at the time of interpretation on 12/02/2020 at 3:56 pm to provider Benedetto Goad, who verbally acknowledged these results. 2. Aortic atherosclerosis. Aortic Atherosclerosis (ICD10-I70.0). Electronically Signed   By: Marijo Conception M.D.   On: 12/02/2020 15:58      Assessment/Plan HTN HLD Depression Hx of CVA without residual deficit  Probable protein calorie malnutrition - check prealbumin Physical deconditioning  DNR - discussed with patient that this does not apply in the OR. We discussed that if her heart were to stop would she want chest compressions or medications given to try to restart her heart and she expressed that she would not want this. We discussed that if she were to stop breathing would she want a breathing tube placed and she expressed that she would not want this.   Diverticulitis most severe in distal sigmoid colon LBO secondary to above Pneumoperitoneum around descending colon  - CT with above - patient not peritonitic on exam, mildly tachycardic, not hypotensive - admit to inpatient and plan exploratory laparotomy with partial colectomy and colostomy tomorrow  - place NGT for decompression in the meantime - if patient has worsening pain or hypotension or worsening tachycardia or fevers overnight, please notify surgical team  FEN: NPO, IVF, NGT to LIWS VTE: SCDs ID: [email or web address]  3/25>> Point of contact: Xianna Siverling [phone]  Norm Parcel, [facility] Surgery 12/02/2020, 4:38 PM Please see Amion for pager number during day hours 7:00am-4:30pm

## 2020-12-02 NOTE — Telephone Encounter (Signed)
Do not know how me days this has been but she may need exam to rule out impaction if symptoms persist.  If they have not already done so, try MiraLAX 17 g p.o. daily as needed for constipation

## 2020-12-02 NOTE — ED Notes (Signed)
Fall risk bundle: Yellow socks Fall risk armband Bed alarm on Fall Risk sign outside of door

## 2020-12-02 NOTE — Telephone Encounter (Signed)
I spoke with Sara Logan, pt has been sent to the ED.

## 2020-12-02 NOTE — ED Triage Notes (Signed)
Ems brings patient in for abdominal pain. Pt reports constipation and nausea.

## 2020-12-02 NOTE — Telephone Encounter (Signed)
[place] [phone]  The patient has not had a bowel movement.  They have given her milk of magnesia and a suppository and she is still bloated and still no bowel movement.  She would like to know if Dr. Elease Hashimoto wanted to order an abdominal ultrasound.

## 2020-12-02 NOTE — Telephone Encounter (Signed)
Please advise 

## 2020-12-02 NOTE — ED Provider Notes (Addendum)
[name] DEPT Provider Note   CSN: [id number] Arrival date & time: 12/02/20  1122     History Chief Complaint  Patient presents with  . Abdominal Pain    Sara Logan is a 87 y.o. female.  HPI Patient is an 87 year old female with a past medical history significant for HLD, thrombocytopenia, depression, arthritis, stroke, diverticulitis with abscess formation in January 2022 --abscess with small without perforation and she was placed on Cipro Flagyl which she took the entire course of.  She states that her appetite has improved since that time.  But she does have a history of chronic protein calorie malnutrition.  According to her note 10/11/2020 from her PCP patient has had ongoing weakness fatigue and protein calorie malnutrition.  She seems to be slowly decompensating.  Patient states that she had abdominal pain starting yesterday morning and left lower abdomen.  She states that she has not been having very many bowel movements recently but states that she did have a bowel movement earlier this morning that was somewhat loose nondiarrhea not constipate/hard.  She denies any dysuria frequency urgency or hematuria.  She states that she is still passing gas.  She states that she has no appetite and feels that she is malnourished. Her nausea has been persistent but she has had no episodes of vomiting.  She states that her pain is improved somewhat since yesterday but she came to the ER for evaluation.  She was given 300 mL of normal saline and 4 mg of Zofran by EMS.  She denies fever but endorses chills.  No lightheadedness or dizziness.     Past Medical History:  Diagnosis Date  . Arthritis   . Depression   . Hyperlipidemia   . Osteopenia   . Stroke ([place])   . Thrombocytopenia ([place])   . Vitamin D deficiency   . Wrist fracture 2002   left    Patient Active Problem List   Diagnosis Date Noted  . Abscess of sigmoid colon due to diverticulitis  10/11/2020  . Diverticulitis 09/28/2020  . Atrophic vaginitis 03/24/2018  . Urine incontinence 03/24/2018  . Hypertensive retinopathy of both eyes 10/14/2017  . Primary osteoarthritis of left knee 06/04/2017  . Near syncope 06/06/2016  . Nausea and vomiting 06/06/2016  . Dyspnea 06/06/2016  . Essential hypertension 10/27/2015  . 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 History of normocytic normochromic anemia 03/18/2015  . Obesity (BMI 30-39.9) 08/07/2013  . Urinary urgency 03/19/2013  . Dermatochalasis 02/20/2013  . PCO (posterior capsular opacification) 02/20/2013  . Osteopenia 08/27/2012  . Pseudophakia 07/18/2012  . Venous stasis 02/06/2012  . 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 Rosacea 07/05/2011  . Vitamin D deficiency 12/08/2010  . Hyperlipidemia LDL goal <70 09/08/2010  . Depression, recurrent ([place]) 09/08/2010    Past Surgical History:  Procedure Laterality Date  . APPENDECTOMY  1967  . CARPAL TUNNEL RELEASE  2008   right  . JOINT REPLACEMENT  2002   right total      OB History   No obstetric history on file.     Family History  Problem Relation Age of Onset  . Breast cancer Mother   . Breast cancer Sister     Social History   Tobacco Use  . Smoking status: Never Smoker  . Smokeless tobacco: Never Used  Vaping Use  . Vaping Use: Never used  Substance Use Topics  . Alcohol use: No  . Drug use:  No    Home Medications Prior to Admission medications   Medication Sig Start Date End Date Taking? Authorizing Provider  amLODipine (NORVASC) 5 MG tablet TAKE 1 TABLET ONCE DAILY. 09/19/20   Burchette, Alinda Sierras, MD  aspirin EC 325 MG EC tablet Take 1 tablet (325 mg total) by mouth daily. 09/27/15   Donzetta Starch, NP  Biotin 10 MG TABS Take 10 mg by mouth daily.     [provider]  Cholecalciferol (VITAMIN D3) 2000 units capsule 1 tablet daily.    [provider]  estradiol (ESTRACE VAGINAL) 0.1 MG/GM vaginal cream Place  1 Applicatorful vaginally 3 (three) times a week. 05/29/19   Burchette, Alinda Sierras, MD  Ferrous Sulfate (IRON PO) Take by mouth.    [provider]  rosuvastatin (CRESTOR) 10 MG tablet TAKE 1 TABLET ONCE DAILY. 10/17/20   Burchette, Alinda Sierras, MD  Specialty Vitamins Products (MAGNESIUM, AMINO ACID CHELATE,) 133 MG tablet Take 1 tablet by mouth daily.    [provider]  TURMERIC PO Take by mouth daily.    [provider]  venlafaxine XR (EFFEXOR-XR) 75 MG 24 hr capsule TAKE 2 CAPSULES (150MG ) BY MOUTH DAILY. 06/15/20   Burchette, Alinda Sierras, MD  vitamin B-12 (CYANOCOBALAMIN) 1000 MCG tablet daily.    [provider]  vitamin C (ASCORBIC ACID) 500 MG tablet Take 500 mg by mouth daily.    [provider]  vitamin E 100 UNIT capsule Take 100 Units by mouth daily.    [provider]  Zinc Sulfate (ZINC 15 PO) Take by mouth.    [provider]    Allergies    Bactrim [sulfamethoxazole-trimethoprim], Ditropan [oxybutynin], and Sulfa antibiotics  Review of Systems   Review of Systems  Constitutional: Positive for fatigue (chronic). Negative for chills and fever.  HENT: Negative for congestion.   Eyes: Negative for pain.  Respiratory: Negative for cough and shortness of breath.   Cardiovascular: Negative for chest pain and leg swelling.  Gastrointestinal: Positive for abdominal pain and nausea. Negative for diarrhea and vomiting.  Genitourinary: Negative for dysuria, vaginal bleeding and vaginal discharge.  Musculoskeletal: Negative for myalgias.  Skin: Negative for rash.  Neurological: Negative for dizziness and headaches.    Physical Exam Updated Vital Signs BP (!) 157/127   Pulse 98   Temp 98 F (36.7 C) (Oral)   Resp (!) 21   Ht 5\' 3"  (1.6 m)   Wt 64 kg   SpO2 96%   BMI 24.98 kg/m   Physical Exam Vitals and nursing note reviewed.  Constitutional:      General: She is not in acute distress.    Comments: Frail, thin appearing  87 year old female does not appear to be in any acute distress.  Sitting in bed.  Pleasant, able answer questions appropriately follow commands.  HENT:     Head: Normocephalic and atraumatic.     Nose: Nose normal.  Eyes:     General: No scleral icterus. Cardiovascular:     Rate and Rhythm: Normal rate and regular rhythm.     Pulses: Normal pulses.     Heart sounds: Normal heart sounds.  Pulmonary:     Effort: Pulmonary effort is normal. No respiratory distress.     Breath sounds: No wheezing.  Abdominal:     Palpations: Abdomen is soft.     Tenderness: There is abdominal tenderness in the left lower quadrant.     Comments: Abdomen is protuberant and tympanic to percussion.  It is focally tender in the left lower quadrant of the abdomen.  No guarding or rebound.  Suprapubic tenderness is mild with deep palpation. No CVA tenderness.  Musculoskeletal:     Cervical back: Normal range of motion.     Right lower leg: No edema.     Left lower leg: No edema.  Skin:    General: Skin is warm and dry.     Capillary Refill: Capillary refill takes less than 2 seconds.  Neurological:     Mental Status: She is alert. Mental status is at baseline.  Psychiatric:        Mood and Affect: Mood normal.        Behavior: Behavior normal.     ED Results / Procedures / Treatments   Labs (all labs ordered are listed, but only abnormal results are displayed) Labs Reviewed  CBC WITH DIFFERENTIAL/PLATELET - Abnormal; Notable for the following components:      Result Value   WBC 11.1 (*)    Neutro Abs 9.3 (*)    Abs Immature Granulocytes 0.08 (*)    All other components within normal limits  COMPREHENSIVE METABOLIC PANEL - Abnormal; Notable for the following components:   Potassium 3.2 (*)    Glucose, Bld 139 (*)    Calcium 8.8 (*)    Albumin 3.0 (*)    AST 13 (*)    All other components within normal limits  LIPASE, BLOOD  LACTIC ACID, PLASMA    EKG None  Radiology No results  found.  Procedures .Critical Care Performed by: Tedd Sias, PA Authorized by: Tedd Sias, PA   Critical care provider statement:    Critical care time (minutes):  35   Critical care time was exclusive of:  Separately billable procedures and treating other patients and teaching time   Critical care was necessary to treat or prevent imminent or life-threatening deterioration of the following conditions: Severe infection/bowel emergency, ischemic bowel requiring surgical intervention.    Critical care was time spent personally by me on the following activities:  Discussions with consultants, evaluation of patient's response to treatment, examination of patient, review of old charts, re-evaluation of patient's condition, pulse oximetry, ordering and review of radiographic studies, ordering and review of laboratory studies and ordering and performing treatments and interventions   I assumed direction of critical care for this patient from another provider in my specialty: no       Medications Ordered in ED Medications - No data to display  ED Course  I have reviewed the triage vital signs and the nursing notes.  Pertinent labs & imaging results that were available during my care of the patient were reviewed by me and considered in my medical decision making (see chart for details).    MDM Rules/Calculators/A&P                          Patient is an 87 year old female with past medical history significant for diverticulitis with abscess formation this was diagnosed in January.  She was on Cipro Flagyl took the entire course of antibiotics felt completely better.  She states that over the past several days she has noticed decreased stools.  She states that she had a bowel movement today which was soft and nonbloody with no dark or tarry appearance.  She states however "it was not normal ".  She denies any fever or chills.  On physical exam patient has left lower quadrant  tenderness  palpation.  Also some suprapubic tenderness.  No other notable tenderness on exam.  She received 4 mg of Zofran IV via EMS as well as 300 mL of normal saline.  Heart rate is upper end of normal approximately 95-100 bpm afebrile mildly hypertensive.  The causes of generalized abdominal pain include but are not limited to AAA, mesenteric ischemia, appendicitis, diverticulitis, DKA, gastritis, gastroenteritis, AMI, nephrolithiasis, pancreatitis, peritonitis, adrenal insufficiency,lead poisoning, iron toxicity, intestinal ischemia, constipation, UTI,SBO/LBO, splenic rupture, biliary disease, IBD, IBS, PUD, or hepatitis.  Given the location of the lower abdominal pain as well as the background the patient's recent diverticulitis flare I do have some suspicion for intra-abdominal infection such as diverticulitis or complication from prior abscess formation.  Doubt UTI.  SBO/LBO is also relatively high in differential given her decreased stools, distended abdomen with tympany and nausea.  She has had no vomiting however.  We will obtain basic labs including lactic although I have low suspicion for mesenteric ischemia.  Lactic within normal limits doubt ischemic colitis.  Lipase is normal without pancreatitis.  CBC with very mild leukocytosis 11.1.  Mild left shift.  CMP with mildly low albumin potentially from poor p.o. intake.  Potassium 3.2 will consider repletion p.o. after CT scan.  Notably patient is pain-free apart from with palpitation.  CT scan pending at this time.  CT scan will be followed up by oncoming team. Ultimately CT showed ischemic colitis/diverticulitis surgery admitted and will likely undergo surgery tomorrow AM.   Final Clinical Impression(s) / ED Diagnoses Final diagnoses:  Left lower quadrant abdominal pain    Rx / DC Orders ED Discharge Orders    None       Tedd Sias, [place] 12/02/20 1533    Pati Gallo [place], [place] 12/03/20 2956    Milton Ferguson, MD 12/03/20  [phone]

## 2020-12-03 ENCOUNTER — Inpatient Hospital Stay (HOSPITAL_COMMUNITY): Payer: Medicare Other | Admitting: Certified Registered Nurse Anesthetist

## 2020-12-03 ENCOUNTER — Encounter (HOSPITAL_COMMUNITY): Admission: EM | Disposition: A | Discharge status: Skilled Nursing Facility | Payer: Self-pay | Source: Home / Self Care

## 2020-12-03 ENCOUNTER — Other Ambulatory Visit: Payer: Self-pay | Admitting: Family Medicine

## 2020-12-03 ENCOUNTER — Inpatient Hospital Stay (HOSPITAL_COMMUNITY): Payer: Medicare Other

## 2020-12-03 ENCOUNTER — Encounter (HOSPITAL_COMMUNITY): Payer: Self-pay

## 2020-12-03 HISTORY — PX: COLOSTOMY: SHX63

## 2020-12-03 HISTORY — PX: COLON RESECTION SIGMOID: SHX6737

## 2020-12-03 LAB — BASIC METABOLIC PANEL
Anion gap: 12 (ref 5–15)
BUN: 18 mg/dL (ref 8–23)
CO2: 25 mmol/L (ref 22–32)
Calcium: 8.5 mg/dL — ABNORMAL LOW (ref 8.9–10.3)
Chloride: 97 mmol/L — ABNORMAL LOW (ref 98–111)
Creatinine, Ser: 0.57 mg/dL (ref 0.44–1.00)
GFR, Estimated: >60 mL/min (ref >60)
Glucose, Bld: 140 mg/dL — ABNORMAL HIGH (ref 70–99)
Potassium: 3.1 mmol/L — ABNORMAL LOW (ref 3.5–5.1)
Sodium: 134 mmol/L — ABNORMAL LOW (ref 135–145)

## 2020-12-03 LAB — CBC
HCT: 44 % (ref 36.0–46.0)
Hemoglobin: 14.1 g/dL (ref 12.0–15.0)
MCH: 28.1 pg (ref 26.0–34.0)
MCHC: 32 g/dL (ref 30.0–36.0)
MCV: 87.6 fL (ref 80.0–100.0)
Platelets: 252 10*3/uL (ref 150–400)
RBC: 5.02 MIL/uL (ref 3.87–5.11)
RDW: 14.9 % (ref 11.5–15.5)
WBC: 10.6 10*3/uL — ABNORMAL HIGH (ref 4.0–10.5)
nRBC: 0 % (ref 0.0–0.2)

## 2020-12-03 LAB — PROTIME-INR
INR: 1.3 — ABNORMAL HIGH (ref 0.8–1.2)
Prothrombin Time: 16.1 seconds — ABNORMAL HIGH (ref 11.4–15.2)

## 2020-12-03 LAB — PREALBUMIN: Prealbumin: 7.7 mg/dL — ABNORMAL LOW (ref 18–38)

## 2020-12-03 SURGERY — COLECTOMY, SIGMOID, OPEN
Anesthesia: General | Site: Abdomen

## 2020-12-03 MED ORDER — ROCURONIUM BROMIDE 100 MG/10ML IV SOLN
INTRAVENOUS | Status: DC | PRN
  Administered 2020-12-03: 50 mg via INTRAVENOUS
  Administered 2020-12-03: 10 mg via INTRAVENOUS

## 2020-12-03 MED ORDER — SUGAMMADEX SODIUM 200 MG/2ML IV SOLN
INTRAVENOUS | Status: DC | PRN
  Administered 2020-12-03: 150 mg via INTRAVENOUS

## 2020-12-03 MED ORDER — DEXAMETHASONE SODIUM PHOSPHATE 10 MG/ML IJ SOLN
INTRAMUSCULAR | Status: DC | PRN
  Administered 2020-12-03: 5 mg via INTRAVENOUS

## 2020-12-03 MED ORDER — VASOPRESSIN 20 UNIT/ML IV SOLN
INTRAVENOUS | Status: DC | PRN
  Administered 2020-12-03: 1 [IU] via INTRAVENOUS

## 2020-12-03 MED ORDER — PHENYLEPHRINE 40 MCG/ML (10ML) SYRINGE FOR IV PUSH (FOR BLOOD PRESSURE SUPPORT)
PREFILLED_SYRINGE | INTRAVENOUS | Status: DC | PRN
  Administered 2020-12-03: 120 ug via INTRAVENOUS
  Administered 2020-12-03: 80 ug via INTRAVENOUS
  Administered 2020-12-03: 120 ug via INTRAVENOUS
  Administered 2020-12-03: 160 ug via INTRAVENOUS
  Administered 2020-12-03 (×2): 80 ug via INTRAVENOUS

## 2020-12-03 MED ORDER — ONDANSETRON HCL 4 MG/2ML IJ SOLN
INTRAMUSCULAR | Status: AC
  Filled 2020-12-03: qty 2

## 2020-12-03 MED ORDER — FENTANYL CITRATE (PF) 100 MCG/2ML IJ SOLN
25.0000 ug | INTRAMUSCULAR | Status: DC | PRN
  Administered 2020-12-03 (×2): 50 ug via INTRAVENOUS

## 2020-12-03 MED ORDER — DEXAMETHASONE SODIUM PHOSPHATE 10 MG/ML IJ SOLN
INTRAMUSCULAR | Status: AC
  Filled 2020-12-03: qty 1

## 2020-12-03 MED ORDER — 0.9 % SODIUM CHLORIDE (POUR BTL) OPTIME
TOPICAL | Status: DC | PRN
  Administered 2020-12-03: 6000 mL
  Administered 2020-12-03: 2000 mL

## 2020-12-03 MED ORDER — PHENYLEPHRINE HCL-NACL 10-0.9 MG/250ML-% IV SOLN
INTRAVENOUS | Status: DC | PRN
  Administered 2020-12-03: 40 ug/min via INTRAVENOUS
  Administered 2020-12-03: 80 ug/min via INTRAVENOUS

## 2020-12-03 MED ORDER — ALBUMIN HUMAN 5 % IV SOLN: INTRAVENOUS | Status: DC | PRN

## 2020-12-03 MED ORDER — PROPOFOL 10 MG/ML IV BOLUS
INTRAVENOUS | Status: AC
  Filled 2020-12-03: qty 20

## 2020-12-03 MED ORDER — SODIUM CHLORIDE 0.9 % IV SOLN: INTRAVENOUS | Status: DC

## 2020-12-03 MED ORDER — FENTANYL CITRATE (PF) 100 MCG/2ML IJ SOLN
INTRAMUSCULAR | Status: AC
  Filled 2020-12-03: qty 2

## 2020-12-03 MED ORDER — PROPOFOL 10 MG/ML IV BOLUS
INTRAVENOUS | Status: DC | PRN
  Administered 2020-12-03: 120 mg via INTRAVENOUS

## 2020-12-03 MED ORDER — BUPIVACAINE-EPINEPHRINE 0.25% -1:200000 IJ SOLN
INTRAMUSCULAR | Status: AC
  Filled 2020-12-03: qty 1

## 2020-12-03 MED ORDER — FENTANYL CITRATE (PF) 100 MCG/2ML IJ SOLN
INTRAMUSCULAR | Status: DC | PRN
  Administered 2020-12-03 (×2): 50 ug via INTRAVENOUS

## 2020-12-03 MED ORDER — LACTATED RINGERS IV SOLN: INTRAVENOUS | Status: DC | PRN

## 2020-12-03 MED ORDER — ROCURONIUM BROMIDE 10 MG/ML (PF) SYRINGE
PREFILLED_SYRINGE | INTRAVENOUS | Status: AC
  Filled 2020-12-03: qty 10

## 2020-12-03 MED ORDER — FENTANYL CITRATE (PF) 250 MCG/5ML IJ SOLN
INTRAMUSCULAR | Status: AC
  Filled 2020-12-03: qty 5

## 2020-12-03 MED ORDER — ONDANSETRON HCL 4 MG/2ML IJ SOLN
INTRAMUSCULAR | Status: DC | PRN
  Administered 2020-12-03: 4 mg via INTRAVENOUS

## 2020-12-03 MED ORDER — VASOPRESSIN 20 UNIT/ML IV SOLN
INTRAVENOUS | Status: AC
  Filled 2020-12-03: qty 1

## 2020-12-03 MED ORDER — LIDOCAINE 2% (20 MG/ML) 5 ML SYRINGE
INTRAMUSCULAR | Status: DC | PRN
  Administered 2020-12-03: 60 mg via INTRAVENOUS

## 2020-12-03 MED ORDER — AMISULPRIDE (ANTIEMETIC) 5 MG/2ML IV SOLN: 10.0000 mg | Freq: Once | INTRAVENOUS | Status: DC | PRN

## 2020-12-03 MED ORDER — LIDOCAINE 2% (20 MG/ML) 5 ML SYRINGE
INTRAMUSCULAR | Status: AC
  Filled 2020-12-03: qty 5

## 2020-12-03 SURGICAL SUPPLY — 70 items
ADH SKN CLS APL DERMABOND .7 (GAUZE/BANDAGES/DRESSINGS) ×2
APL PRP STRL LF DISP 70% ISPRP (MISCELLANEOUS) ×2
BLADE EXTENDED COATED 6.5IN (ELECTRODE) IMPLANT
BLADE HEX COATED 2.75 (ELECTRODE) ×3 IMPLANT
CELLS DAT CNTRL 66122 CELL SVR (MISCELLANEOUS) ×2 IMPLANT
CHLORAPREP W/TINT 26 (MISCELLANEOUS) ×3 IMPLANT
COUNTER NEEDLE 20 DBL MAG RED (NEEDLE) ×3 IMPLANT
COVER MAYO STAND STRL (DRAPES) ×9 IMPLANT
COVER WAND RF STERILE (DRAPES) IMPLANT
DECANTER SPIKE VIAL GLASS SM (MISCELLANEOUS) ×3 IMPLANT
DERMABOND ADVANCED (GAUZE/BANDAGES/DRESSINGS) ×1
DERMABOND ADVANCED .7 DNX12 (GAUZE/BANDAGES/DRESSINGS) ×2 IMPLANT
DRAIN CHANNEL 19F RND (DRAIN) ×3 IMPLANT
DRAPE LAPAROSCOPIC ABDOMINAL (DRAPES) ×3 IMPLANT
DRAPE SHEET LG 3/4 BI-LAMINATE (DRAPES) IMPLANT
DRAPE SURG IRRIG POUCH 19X23 (DRAPES) ×3 IMPLANT
DRAPE WARM FLUID 44X44 (DRAPES) ×3 IMPLANT
DRSG OPSITE POSTOP 4X10 (GAUZE/BANDAGES/DRESSINGS) IMPLANT
DRSG OPSITE POSTOP 4X12 (GAUZE/BANDAGES/DRESSINGS) ×3 IMPLANT
DRSG OPSITE POSTOP 4X6 (GAUZE/BANDAGES/DRESSINGS) IMPLANT
DRSG OPSITE POSTOP 4X8 (GAUZE/BANDAGES/DRESSINGS) IMPLANT
DRSG TEGADERM 4X4.75 (GAUZE/BANDAGES/DRESSINGS) ×6 IMPLANT
ELECT REM PT RETURN 15FT ADLT (MISCELLANEOUS) ×3 IMPLANT
EVACUATOR SILICONE 100CC (DRAIN) ×3 IMPLANT
GAUZE SPONGE 4X4 12PLY STRL (GAUZE/BANDAGES/DRESSINGS) ×3 IMPLANT
GLOVE SURG ENC MOIS LTX SZ6.5 (GLOVE) ×6 IMPLANT
GLOVE SURG UNDER POLY LF SZ7 (GLOVE) ×6 IMPLANT
GOWN STRL REUS W/TWL XL LVL3 (GOWN DISPOSABLE) ×18 IMPLANT
HANDLE SUCTION POOLE (INSTRUMENTS) ×2 IMPLANT
KIT BASIN OR (CUSTOM PROCEDURE TRAY) ×3 IMPLANT
KIT TURNOVER KIT A (KITS) ×3 IMPLANT
LEGGING LITHOTOMY PAIR STRL (DRAPES) IMPLANT
LIGASURE IMPACT 36 18CM CVD LR (INSTRUMENTS) ×3 IMPLANT
PACK COLON (CUSTOM PROCEDURE TRAY) ×3 IMPLANT
PACK GENERAL/GYN (CUSTOM PROCEDURE TRAY) ×3 IMPLANT
PAD POSITIONING PINK XL (MISCELLANEOUS) ×3 IMPLANT
PENCIL SMOKE EVACUATOR (MISCELLANEOUS) IMPLANT
PROTECTOR NERVE ULNAR (MISCELLANEOUS) ×3 IMPLANT
RELOAD GRN CONTOUR (ENDOMECHANICALS) ×3 IMPLANT
RETRACTOR WND ALEXIS 25 LRG (MISCELLANEOUS) ×2 IMPLANT
RTRCTR WOUND ALEXIS 18CM MED (MISCELLANEOUS) ×3
RTRCTR WOUND ALEXIS 25CM LRG (MISCELLANEOUS) ×3
SEALER TISSUE G2 STRG ARTC 35C (ENDOMECHANICALS) IMPLANT
SPONGE DRAIN TRACH 4X4 STRL 2S (GAUZE/BANDAGES/DRESSINGS) IMPLANT
SPONGE LAP 18X18 RF (DISPOSABLE) IMPLANT
STAPLER CUT RELOAD BLUE (STAPLE) ×3 IMPLANT
STAPLER CVD CUT BL 40 RELOAD (ENDOMECHANICALS) ×3 IMPLANT
STAPLER VISISTAT 35W (STAPLE) ×3 IMPLANT
SUCTION POOLE HANDLE (INSTRUMENTS) ×3
SURGILUBE 2OZ TUBE FLIPTOP (MISCELLANEOUS) ×3 IMPLANT
SUT ETHILON 2 0 PS N (SUTURE) ×9 IMPLANT
SUT NOVA NAB DX-16 0-1 5-0 T12 (SUTURE) ×6 IMPLANT
SUT NOVA NAB GS-21 0 18 T12 DT (SUTURE) ×6 IMPLANT
SUT PDS AB 1 CTX 36 (SUTURE) IMPLANT
SUT PDS AB 1 TP1 96 (SUTURE) IMPLANT
SUT PROLENE 2 0 BLUE (SUTURE) ×3 IMPLANT
SUT PROLENE 2 0 KS (SUTURE) ×3 IMPLANT
SUT SILK 2 0 (SUTURE) ×3
SUT SILK 2 0 SH CR/8 (SUTURE) ×3 IMPLANT
SUT SILK 2-0 18XBRD TIE 12 (SUTURE) ×2 IMPLANT
SUT SILK 3 0 (SUTURE) ×3
SUT SILK 3 0 SH CR/8 (SUTURE) ×3 IMPLANT
SUT SILK 3-0 18XBRD TIE 12 (SUTURE) ×2 IMPLANT
SUT VIC AB 2-0 SH 18 (SUTURE) ×6 IMPLANT
SUT VIC AB 4-0 PS2 27 (SUTURE) ×3 IMPLANT
TOWEL OR 17X26 10 PK STRL BLUE (TOWEL DISPOSABLE) ×6 IMPLANT
TOWEL OR NON WOVEN STRL DISP B (DISPOSABLE) ×3 IMPLANT
TRAY FOLEY MTR SLVR 16FR STAT (SET/KITS/TRAYS/PACK) ×3 IMPLANT
TUBING CONNECTING 10 (TUBING) ×3 IMPLANT
YANKAUER SUCT BULB TIP NO VENT (SUCTIONS) ×3 IMPLANT

## 2020-12-03 NOTE — Progress Notes (Signed)
Diverticulitis  Subjective: Pt has underwent NG decompression and resuscitation overnight  Objective: Vital signs in last 24 hours: Temp:  [97.6 F (36.4 C)-98 F (36.7 C)] 97.6 F (36.4 C) (03/26 0000) Pulse Rate:  [36-108] 99 (03/26 0400) Resp:  [12-24] 22 (03/26 0400) BP: (148-175)/(69-127) 167/82 (03/26 0400) SpO2:  [94 %-98 %] 95 % (03/26 0400) Weight:  [62.7 kg-64 kg] 62.7 kg (03/25 1811)    Intake/Output from previous day: 03/25 0701 - 03/26 0700 In: 505.1 [I.V.:394.6; IV Piggyback:110.5] Out: -  Intake/Output this shift: Total I/O In: 505.1 [I.V.:394.6; IV Piggyback:110.5] Out: -   General appearance: alert and cooperative GI: normal findings: soft, distended  Lab Results:  Results for orders placed or performed during the hospital encounter of 12/02/20 (from the past 24 hour(s))  CBC with Differential/Platelet     Status: Abnormal   Collection Time: 12/02/20 11:43 AM  Result Value Ref Range   WBC 11.1 (H) 4.0 - 10.5 K/uL   RBC 4.55 3.87 - 5.11 MIL/uL   Hemoglobin 12.7 12.0 - 15.0 g/dL   HCT 40.0 36.0 - 46.0 %   MCV 87.9 80.0 - 100.0 fL   MCH 27.9 26.0 - 34.0 pg   MCHC 31.8 30.0 - 36.0 g/dL   RDW 14.6 11.5 - 15.5 %   Platelets 208 150 - 400 K/uL   nRBC 0.0 0.0 - 0.2 %   Neutrophils Relative % 83 %   Neutro Abs 9.3 (H) 1.7 - 7.7 K/uL   Lymphocytes Relative 8 %   Lymphs Abs 0.9 0.7 - 4.0 K/uL   Monocytes Relative 8 %   Monocytes Absolute 0.8 0.1 - 1.0 K/uL   Eosinophils Relative 0 %   Eosinophils Absolute 0.0 0.0 - 0.5 K/uL   Basophils Relative 0 %   Basophils Absolute 0.0 0.0 - 0.1 K/uL   Immature Granulocytes 1 %   Abs Immature Granulocytes 0.08 (H) 0.00 - 0.07 K/uL  Lipase, blood     Status: None   Collection Time: 12/02/20 11:43 AM  Result Value Ref Range   Lipase 32 11 - 51 U/L  Comprehensive metabolic panel     Status: Abnormal   Collection Time: 12/02/20 11:43 AM  Result Value Ref Range   Sodium 138 135 - 145 mmol/L   Potassium 3.2 (L) 3.5  - 5.1 mmol/L   Chloride 100 98 - 111 mmol/L   CO2 28 22 - 32 mmol/L   Glucose, Bld 139 (H) 70 - 99 mg/dL   BUN 17 8 - 23 mg/dL   Creatinine, Ser 0.57 0.44 - 1.00 mg/dL   Calcium 8.8 (L) 8.9 - 10.3 mg/dL   Total Protein 7.1 6.5 - 8.1 g/dL   Albumin 3.0 (L) 3.5 - 5.0 g/dL   AST 13 (L) 15 - 41 U/L   ALT 11 0 - 44 U/L   Alkaline Phosphatase 60 38 - 126 U/L   Total Bilirubin 0.9 0.3 - 1.2 mg/dL   GFR, Estimated >60 >60 mL/min   Anion gap 10 5 - 15  Lactic acid, plasma     Status: None   Collection Time: 12/02/20  1:04 PM  Result Value Ref Range   Lactic Acid, Venous 1.6 0.5 - 1.9 mmol/L  Resp Panel by RT-PCR (Flu A&B, Covid) Nasopharyngeal Swab     Status: None   Collection Time: 12/02/20  4:14 PM   Specimen: Nasopharyngeal Swab; Nasopharyngeal(NP) swabs in vial transport medium  Result Value Ref Range   SARS Coronavirus 2 by  RT PCR NEGATIVE NEGATIVE   Influenza A by PCR NEGATIVE NEGATIVE   Influenza B by PCR NEGATIVE NEGATIVE  MRSA PCR Screening     Status: None   Collection Time: 12/02/20  6:27 PM   Specimen: Nasal Mucosa; Nasopharyngeal  Result Value Ref Range   MRSA by PCR NEGATIVE NEGATIVE  Basic metabolic panel     Status: Abnormal   Collection Time: 12/03/20  2:07 AM  Result Value Ref Range   Sodium 134 (L) 135 - 145 mmol/L   Potassium 3.1 (L) 3.5 - 5.1 mmol/L   Chloride 97 (L) 98 - 111 mmol/L   CO2 25 22 - 32 mmol/L   Glucose, Bld 140 (H) 70 - 99 mg/dL   BUN 18 8 - 23 mg/dL   Creatinine, Ser 0.57 0.44 - 1.00 mg/dL   Calcium 8.5 (L) 8.9 - 10.3 mg/dL   GFR, Estimated >60 >60 mL/min   Anion gap 12 5 - 15  CBC     Status: Abnormal   Collection Time: 12/03/20  2:07 AM  Result Value Ref Range   WBC 10.6 (H) 4.0 - 10.5 K/uL   RBC 5.02 3.87 - 5.11 MIL/uL   Hemoglobin 14.1 12.0 - 15.0 g/dL   HCT 44.0 36.0 - 46.0 %   MCV 87.6 80.0 - 100.0 fL   MCH 28.1 26.0 - 34.0 pg   MCHC 32.0 30.0 - 36.0 g/dL   RDW 14.9 11.5 - 15.5 %   Platelets 252 150 - 400 K/uL   nRBC 0.0 0.0 -  0.2 %  Protime-INR     Status: Abnormal   Collection Time: 12/03/20  2:07 AM  Result Value Ref Range   Prothrombin Time 16.1 (H) 11.4 - 15.2 seconds   INR 1.3 (H) 0.8 - 1.2  Prealbumin     Status: Abnormal   Collection Time: 12/03/20  2:07 AM  Result Value Ref Range   Prealbumin 7.7 (L) 18 - 38 mg/dL     Studies/Results Radiology     MEDS, Scheduled . chlorhexidine  15 mL Mouth Rinse BID  . Chlorhexidine Gluconate Cloth  6 each Topical Daily  . enoxaparin (LOVENOX) injection  40 mg Subcutaneous Q24H  . mouth rinse  15 mL Mouth Rinse q12n4p  . pantoprazole (PROTONIX) IV  40 mg Intravenous QHS     Assessment: Diverticulitis With Obstruction  Plan: I have recommended open sigmoid colectomy with end ostomy to relieve obstruction and allow her to regain her nutritional status.  We will evaluate her dilated cecum as well during surgery.  I have discussed this in detail with her and reviewed her CT scans.  All questions were answered. The surgery and anatomy were described to the patient as well as the risks of surgery and the possible complications.  These include: Bleeding, deep abdominal infections and possible wound complications such as hernia and infection, damage to adjacent structures, leak of surgical connections, which can lead to other surgeries and possibly an ostomy, possible need for other procedures, such as abscess drains in radiology, possible prolonged hospital stay, possible diarrhea from removal of part of the colon, possible constipation from narcotics, prolonged fatigue/weakness or appetite loss, possible early recurrence of of disease, possible complications of their medical problems such as heart disease or arrhythmias or lung problems, death (less than 1%). I believe the patient understands and wishes to proceed with the surgery.   LOS: 1 day    Rosario Adie, MD [facility], [place]  12/03/2020 6:44 AM

## 2020-12-03 NOTE — Transfer of Care (Signed)
Immediate Anesthesia Transfer of Care Note  Patient: Sara Logan  Procedure(s) Performed: EXPLORATORY LAPAROTOMY; HARTMAN'S PROCEDURE; SPLEENIC FLEXURE MOBILIZATION (N/A Abdomen) COLOSTOMY (N/A )  Patient Location: PACU  Anesthesia Type:General  Level of Consciousness: drowsy and patient cooperative  Airway & Oxygen Therapy: Patient Spontanous Breathing and Patient connected to face mask oxygen  Post-op Assessment: Report given to RN and Post -op Vital signs reviewed and stable  Post vital signs: Reviewed and stable  Last Vitals:  Vitals Value Taken Time  BP 115/57 12/03/20 1000  Temp    Pulse 90 12/03/20 1003  Resp 22 12/03/20 1003  SpO2 100 % 12/03/20 1003  Vitals shown include unvalidated device data.  Last Pain:  Vitals:   12/03/20 0400  TempSrc:   PainSc: Asleep      Patients Stated Pain Goal: 0 (36/64/40 3474)  Complications: No complications documented.

## 2020-12-03 NOTE — Anesthesia Procedure Notes (Signed)
Procedure Name: Intubation Date/Time: 12/03/2020 7:49 AM Performed by: Gwyndolyn Saxon, CRNA Pre-anesthesia Checklist: Patient identified, Emergency Drugs available, Suction available and Patient being monitored Patient Re-evaluated:Patient Re-evaluated prior to induction Oxygen Delivery Method: Circle system utilized Preoxygenation: Pre-oxygenation with 100% oxygen Induction Type: IV induction Ventilation: Mask ventilation without difficulty Laryngoscope Size: Miller and 2 Grade View: Grade I Tube type: Oral Tube size: 6.5 mm Number of attempts: 1 Airway Equipment and Method: Patient positioned with wedge pillow and Stylet Placement Confirmation: ETT inserted through vocal cords under direct vision,  positive ETCO2 and breath sounds checked- equal and bilateral Secured at: 21 cm Tube secured with: Tape Dental Injury: Teeth and Oropharynx as per pre-operative assessment  Comments: Pt with loose left front tooth prior to induction. Tooth intact after intubation

## 2020-12-03 NOTE — Op Note (Signed)
12/02/2020 - 12/03/2020  9:46 AM  PATIENT:  De Nurse  87 y.o. female  Patient Care Team: Eulas Post, MD as PCP - General Kipp Brood Mariam Dollar, [facility] as Pharmacist (Pharmacist)  PRE-OPERATIVE DIAGNOSIS:  BOWEL OBSTRUCTION  POST-OPERATIVE DIAGNOSIS:  DISTAL SIGMOID OBSTRUCTION  PROCEDURE:   EXPLORATORY LAPAROTOMY; HARTMAN'S PROCEDURE; SPLEENIC FLEXURE MOBILIZATION END COLOSTOMY    Surgeon(s): Leighton Ruff, MD  ASSISTANT: Dr Ninfa Linden   ANESTHESIA:   general  EBL: 84ml  Total I/O In: 1500 [I.V.:1000; IV Piggyback:500] Out: 400 [Urine:150; Other:200; Blood:50]  DRAINS: (40F ) Jackson-Pratt drain(s) with closed bulb suction in the pelvis   SPECIMEN:  Source of Specimen:  sigmoid colon and descending colon  DISPOSITION OF SPECIMEN:  PATHOLOGY  COUNTS:  YES  PLAN OF CARE: Patient already admitted  PATIENT DISPOSITION:  PACU - hemodynamically stable.  INDICATION: 87 y.o. F who has a several month history of chronic diverticulitis with stricture.  She presented to the emergency department with nausea and vomiting and was found to have obstruction due to her diverticular disease.  She was resuscitated overnight and NG tube was placed for decompression.    OR FINDINGS: Significant inflammation at the rectosigmoid junction requiring transection of the proximal rectum.  DESCRIPTION: the patient was identified in the preoperative holding area and taken to the OR where they were laid supine on the operating room table.  General anesthesia was induced without difficulty. SCDs were also noted to be in place prior to the initiation of anesthesia.  The patient was then prepped and draped in the usual sterile fashion.   A surgical timeout was performed indicating the correct patient, procedure, positioning and need for preoperative antibiotics.   I began by making a lower midline incision using a 10 blade scalpel.  Dissection was carried down through the subcutaneous tissue using  electrocautery.  The fascia was divided and the peritoneum was entered bluntly.  An [place] wound protector was placed.  I bluntly mobilized the sigmoid colon off of the left pelvic sidewall using a finger fracture technique.  I mobilized the distal descending colon by dividing the white line of Toldt.  I continue my dissection bluntly underneath the sigmoid colon.  I was able to get the sigmoid colon up enough to evaluate the left pelvis.  The ureter was identified in its normal anatomic position.  We kept our dissection above this plane.  I continued down into the posterior mesorectal plane.  There was significant inflammation of the proximal rectum.  I dissected down below this as much as I could.  Once this mobilization was complete I was able to transect the proximal sigmoid colon using a blue load contour stapler.  I then used the LigaSure device to divide the mesentery down to the level of the mesorectum.  I divided the mesentery well above the level of the pelvic floor.  I then divided the lateral edges of the rectosigmoid junction using the LigaSure device.  I divided as much of the mesorectum as I could in the proximal rectum.  I then placed a green load contour stapler onto normal rectal tissue and divided here.  I then placed a 2-0 Prolene suture into the right lateral edge of the staple line.  I was concerned that there may be a small leak at the staple line and therefore this was oversewn with a Prolene.  I then mobilized the remaining colon out of the abdomen by dividing the lateral attachments.  This did not mobilize well  to the abdominal wall and therefore I decided to take down the splenic flexure.  I was able to divide this in sections using the LigaSure device.  I divided the omentum off of the distal transverse colon also using the LigaSure device.  The remaining attachments to the plantar flexure were divided after this.  Once this was complete there was some oozing noted in the left upper  quadrant.  This was packed with a sponge.  We rechecked this on several occasions and the bleeding became less and less.  On her final recheck there was no active bleeding noted.  I identified the spleen tip and there was no sign of injury to it.  The descending colon did not appear to be viable as an ostomy and therefore I made a small enterotomy in this and decompressed the proximal colon through this enterotomy site.  Once this was complete, I divided the distal transverse colon using a green load contour stapler load.  The mesentery was divided with the LigaSure.  This was sent to pathology as a separate specimen.  We then irrigated the abdomen and pelvis with 3 L of warm normal saline.  We inspected the entire abdomen.  The cecum was decompressed and perfusing.  I evaluated the transverse colon.  This appeared somewhat dusky on the lateral edges but she had a bounding pulse in the mesentery.  This was then brought up as a transverse colostomy in the left upper quadrant through the rectus.  At this point we switched to clean gowns, gloves, instruments and drapes.  A 19 [place] Blake drain was placed in the pelvis and brought out through the right lower quadrant.  This was secured in place with a 2-0 nylon suture.  The fascia was then closed using 2, #1 PDS running sutures.  The subcutaneous tissue was reapproximated using a running 2-0 Vicryl suture.  The skin was closed using staples.  A sterile dressing was applied.  The ostomy was matured in standard [place] fashion using interrupted 2-0 Vicryl sutures.  An ostomy appliance was placed.  The patient was then awakened from anesthesia and sent to the postanesthesia care unit in stable condition.  All counts were correct per operating room staff.

## 2020-12-03 NOTE — Anesthesia Preprocedure Evaluation (Addendum)
Anesthesia Evaluation  Patient identified by MRN, date of birth, ID band Patient awake    Reviewed: Allergy & Precautions, NPO status , Patient's Chart, lab work & pertinent test results  Airway Mallampati: II       Dental  (+) Partial Lower, Poor Dentition, Loose,    Pulmonary    Pulmonary exam normal        Cardiovascular hypertension, Pt. on medications Normal cardiovascular exam     Neuro/Psych PSYCHIATRIC DISORDERS Depression    GI/Hepatic Neg liver ROS,   Endo/Other  negative endocrine ROS  Renal/GU negative Renal ROS     Musculoskeletal   Abdominal Normal abdominal exam  (+)   Peds  Hematology negative hematology ROS (+)   Anesthesia Other Findings   Reproductive/Obstetrics                            Anesthesia Physical Anesthesia Plan  ASA: II  Anesthesia Plan: General   Post-op Pain Management:    Induction: Intravenous  PONV Risk Score and Plan: 4 or greater and Ondansetron and Dexamethasone  Airway Management Planned: Oral ETT  Additional Equipment: None  Intra-op Plan:   Post-operative Plan: Extubation in OR  Informed Consent: I have reviewed the patients History and Physical, chart, labs and discussed the procedure including the risks, benefits and alternatives for the proposed anesthesia with the patient or authorized representative who has indicated his/her understanding and acceptance.     Dental advisory given  Plan Discussed with: CRNA  Anesthesia Plan Comments:         Anesthesia Quick Evaluation

## 2020-12-03 NOTE — Anesthesia Postprocedure Evaluation (Signed)
Anesthesia Post Note  Patient: Sara Logan  Procedure(s) Performed: EXPLORATORY LAPAROTOMY; HARTMAN'S PROCEDURE; SPLEENIC FLEXURE MOBILIZATION (N/A Abdomen) COLOSTOMY (N/A )     Patient location during evaluation: PACU Anesthesia Type: General Level of consciousness: awake Pain management: pain level controlled Vital Signs Assessment: post-procedure vital signs reviewed and stable Respiratory status: spontaneous breathing Cardiovascular status: stable Postop Assessment: no apparent nausea or vomiting Anesthetic complications: no   No complications documented.  Last Vitals:  Vitals:   12/03/20 1045 12/03/20 1053  BP: 120/63   Pulse: 96 100  Resp: 11 13  Temp:    SpO2: 99% 100%    Last Pain:  Vitals:   12/03/20 1045  TempSrc:   PainSc: Megargel Jr

## 2020-12-04 ENCOUNTER — Encounter (HOSPITAL_COMMUNITY): Payer: Self-pay | Admitting: General Surgery

## 2020-12-04 LAB — CBC
HCT: 27.8 % — ABNORMAL LOW (ref 36.0–46.0)
Hemoglobin: 8.7 g/dL — ABNORMAL LOW (ref 12.0–15.0)
MCH: 28.7 pg (ref 26.0–34.0)
MCHC: 31.3 g/dL (ref 30.0–36.0)
MCV: 91.7 fL (ref 80.0–100.0)
Platelets: 154 10*3/uL (ref 150–400)
RBC: 3.03 MIL/uL — ABNORMAL LOW (ref 3.87–5.11)
RDW: 15.5 % (ref 11.5–15.5)
WBC: 7.9 10*3/uL (ref 4.0–10.5)
nRBC: 0 % (ref 0.0–0.2)

## 2020-12-04 LAB — BASIC METABOLIC PANEL
Anion gap: 6 (ref 5–15)
BUN: 29 mg/dL — ABNORMAL HIGH (ref 8–23)
CO2: 26 mmol/L (ref 22–32)
Calcium: 7.9 mg/dL — ABNORMAL LOW (ref 8.9–10.3)
Chloride: 106 mmol/L (ref 98–111)
Creatinine, Ser: 0.85 mg/dL (ref 0.44–1.00)
GFR, Estimated: >60 mL/min (ref >60)
Glucose, Bld: 122 mg/dL — ABNORMAL HIGH (ref 70–99)
Potassium: 3.3 mmol/L — ABNORMAL LOW (ref 3.5–5.1)
Sodium: 138 mmol/L (ref 135–145)

## 2020-12-04 MED ORDER — SODIUM CHLORIDE 0.9 % IV BOLUS
1000.0000 mL | Freq: Once | INTRAVENOUS | Status: AC
  Administered 2020-12-04: 1000 mL via INTRAVENOUS

## 2020-12-04 NOTE — Progress Notes (Signed)
Report called to receiving Rn [id number]. Patient with no complaints at the current time. Daughter at bedside and ware of transfer.

## 2020-12-04 NOTE — Progress Notes (Signed)
1 Day Post-Op open Hartman's Subjective: Having some hallucinations overnight.  Mild tachycardia, decreased UOP  Objective: Vital signs in last 24 hours: Temp:  [97.4 F (36.3 C)-98.3 F (36.8 C)] 98.2 F (36.8 C) (03/27 0600) Pulse Rate:  [89-111] 105 (03/27 0600) Resp:  [11-25] 15 (03/27 0600) BP: (101-140)/(48-66) 140/54 (03/27 0600) SpO2:  [94 %-100 %] 96 % (03/27 0600)   Intake/Output from previous day: 03/26 0701 - 03/27 0700 In: 3986.5 [I.V.:3156.4; IV Piggyback:830] Out: 830 [Urine:275; Emesis/NG output:50; Drains:255; Blood:50] Intake/Output this shift: No intake/output data recorded.   General appearance: alert and cooperative GI: normal findings: soft, distended  Incision: no significant drainage  Lab Results:  Recent Labs    12/02/20 1143 12/03/20 0207  WBC 11.1* 10.6*  HGB 12.7 14.1  HCT 40.0 44.0  PLT 208 252   BMET Recent Labs    12/02/20 1143 12/03/20 0207  NA 138 134*  K 3.2* 3.1*  CL 100 97*  CO2 28 25  GLUCOSE 139* 140*  BUN 17 18  CREATININE 0.57 0.57  CALCIUM 8.8* 8.5*   PT/INR Recent Labs    12/03/20 0207  LABPROT 16.1*  INR 1.3*   ABG No results for input(s): PHART, HCO3 in the last 72 hours.  Invalid input(s): PCO2, PO2  MEDS, Scheduled . chlorhexidine  15 mL Mouth Rinse BID  . Chlorhexidine Gluconate Cloth  6 each Topical Daily  . enoxaparin (LOVENOX) injection  40 mg Subcutaneous Q24H  . mouth rinse  15 mL Mouth Rinse q12n4p  . pantoprazole (PROTONIX) IV  40 mg Intravenous QHS    Studies/Results: CT ABDOMEN PELVIS W CONTRAST  Result Date: 12/02/2020 CLINICAL DATA:  Left lower quadrant abdominal pain. EXAM: CT ABDOMEN AND PELVIS WITH CONTRAST TECHNIQUE: Multidetector CT imaging of the abdomen and pelvis was performed using the standard protocol following bolus administration of intravenous contrast. CONTRAST:  163mL OMNIPAQUE IOHEXOL 300 MG/ML  SOLN COMPARISON:  September 22, 2020. FINDINGS: Lower chest: No acute  abnormality. Hepatobiliary: No focal liver abnormality is seen. No gallstones, gallbladder wall thickening, or biliary dilatation. Pancreas: Unremarkable. No pancreatic ductal dilatation or surrounding inflammatory changes. Spleen: Normal in size without focal abnormality. Adrenals/Urinary Tract: Adrenal glands are unremarkable. Kidneys are normal, without renal calculi, focal lesion, or hydronephrosis. Bladder is unremarkable. Stomach/Bowel: The stomach appears normal. There is severe dilatation of the large bowel as well as the distal small bowel, concerning for distal colonic obstruction. Severe wall thickening is seen involving the distal sigmoid colon consistent with diverticulitis. Wall thickening of the splenic flexure and descending colon is also noted with a possible suggestion of intramural gas suggesting possible ischemic bowel. There is noted free air around the descending colon concerning for bowel perforation. Vascular/Lymphatic: Aortic atherosclerosis. No enlarged abdominal or pelvic lymph nodes. Reproductive: Uterus and bilateral adnexa are unremarkable. Other: No abdominal wall hernia or abnormality. Small amount of free fluid is noted around the liver. Musculoskeletal: No acute or significant osseous findings. IMPRESSION: 1. Severe wall thickening is seen involving the distal sigmoid colon consistent with diverticulitis. There is noted free air around the descending colon concerning for bowel perforation. Wall thickening of the splenic flexure and descending colon is also noted with possible intramural gas suggesting possible ischemic bowel. Severe dilatation of the large bowel as well as the distal small bowel is noted, concerning for distal colonic obstruction. Critical Value/emergent results were called by telephone at the time of interpretation on 12/02/2020 at 3:56 pm to provider Benedetto Goad, who verbally acknowledged these  results. 2. Aortic atherosclerosis. Aortic Atherosclerosis  (ICD10-I70.0). Electronically Signed   By: Marijo Conception M.D.   On: 12/02/2020 15:58   DG Chest Port 1 View  Result Date: 12/03/2020 CLINICAL DATA:  Preop.  Plan for colon resection EXAM: PORTABLE CHEST 1 VIEW COMPARISON:  Abdominal CT from earlier today FINDINGS: Enteric tube which loops in the stomach, tip at the fundus. Normal heart size and stable aortic tortuosity. Minimal interstitial coarsening at the bases attributed atelectasis. There is no edema, consolidation, effusion, or pneumothorax. IMPRESSION: Minimal atelectasis. Electronically Signed   By: Monte Fantasia M.D.   On: 12/03/2020 05:00   DG Abd Portable 1V  Result Date: 12/02/2020 CLINICAL DATA:  Check gastric catheter placement EXAM: PORTABLE ABDOMEN - 1 VIEW COMPARISON:  CT from earlier in the same day. FINDINGS: Gastric catheter is noted coiled within the stomach. Scattered large and small bowel gas is noted. Persistent gaseous distension of the colon is noted similar to that seen on prior CT. IMPRESSION: Gastric catheter within the stomach. Electronically Signed   By: Inez Catalina M.D.   On: 12/02/2020 19:37    Assessment: s/p Procedure(s): EXPLORATORY LAPAROTOMY; HARTMAN'S PROCEDURE; SPLEENIC FLEXURE MOBILIZATION COLOSTOMY Patient Active Problem List   Diagnosis Date Noted  . Abscess of sigmoid colon due to diverticulitis 10/11/2020  . Diverticulitis 09/28/2020  . Atrophic vaginitis 03/24/2018  . Urine incontinence 03/24/2018  . Hypertensive retinopathy of both eyes 10/14/2017  . Primary osteoarthritis of left knee 06/04/2017  . Near syncope 06/06/2016  . Nausea and vomiting 06/06/2016  . Dyspnea 06/06/2016  . Essential hypertension 10/27/2015  . 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 History of normocytic normochromic anemia 03/18/2015  . Obesity (BMI 30-39.9) 08/07/2013  . Urinary urgency 03/19/2013  . Dermatochalasis 02/20/2013  . PCO (posterior capsular opacification) 02/20/2013  . Osteopenia  08/27/2012  . Pseudophakia 07/18/2012  . Venous stasis 02/06/2012  . 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 Rosacea 07/05/2011  . Vitamin D deficiency 12/08/2010  . Hyperlipidemia LDL goal <70 09/08/2010  . Depression, recurrent ([place]) 09/08/2010    Expected post op course  Plan: Cont NG  Increase IVF's and bolus 1 L NS for dehydration Recheck labs this am  Transfer to tele bed if available   LOS: 2 days     .Rosario Adie, MD [facility], [place]    12/04/2020 7:33 AM

## 2020-12-04 NOTE — Progress Notes (Signed)
Initial Nutrition Assessment  DOCUMENTATION CODES:   Not applicable  INTERVENTION:   RD will monitor for diet advancement vs the need for nutrition support  Pt at high refeed risk; recommend monitor potassium, magnesium and phosphorus labs daily until stable  NUTRITION DIAGNOSIS:   Inadequate oral intake related to acute illness as evidenced by NPO status.  GOAL:   Patient will meet greater than or equal to 90% of their needs  MONITOR:   Diet advancement,Labs,Weight trends,Skin,I & O's  REASON FOR ASSESSMENT:   Malnutrition Screening Tool    ASSESSMENT:   87 y/o female with h/o depression and stroke who is admitted with diverticular abscess now s/p Hartmann's and end colostomy 3/26  RD working remotely.  Unable to reach pt by phone. Per chart review, pt with decreased appetite and oral intake pta r/t abdominal pain, nausea and constipation. Pt has remained NPO in hospital. NGT in place with 42ml output. Drains with 242ml output. RD will monitor for diet advancement for the need for nutrition support. Pt is at refeed risk. RD will obtain nutrition related history and exam at follow-up.    Medications reviewed and include: lovenox, protonix, NaCl @125ml /hr, zosyn  Labs reviewed: Na 134(L), K 3.1(L), prealbumin 7.7(L) Wbc- 10.6(H)  NUTRITION - FOCUSED PHYSICAL EXAM: Unable to perform at this time   Diet Order:   Diet Order            Diet NPO time specified Except for: Ice Chips  Diet effective now                EDUCATION NEEDS:   No education needs have been identified at this time  Skin:  Skin Assessment: Reviewed RN Assessment (incision abdomen)  Last BM:  3/26- small amount via ostomy  Height:   Ht Readings from Last 1 Encounters:  12/02/20 5\' 3"  (1.6 m)    Weight:   Wt Readings from Last 1 Encounters:  12/02/20 62.7 kg    Ideal Body Weight:  52.3 kg  BMI:  Body mass index is 24.49 kg/m.  Estimated Nutritional Needs:   Kcal:   1400-1600kcal/day  Protein:  70-80g/day  Fluid:  1.4-1.6L/day  Koleen Distance MS, RD, LDN Please refer to [facility] for RD and/or RD on-call/weekend/after hours pager

## 2020-12-05 LAB — BASIC METABOLIC PANEL
Anion gap: 6 (ref 5–15)
BUN: 22 mg/dL (ref 8–23)
CO2: 25 mmol/L (ref 22–32)
Calcium: 8.1 mg/dL — ABNORMAL LOW (ref 8.9–10.3)
Chloride: 110 mmol/L (ref 98–111)
Creatinine, Ser: 0.7 mg/dL (ref 0.44–1.00)
GFR, Estimated: >60 mL/min (ref >60)
Glucose, Bld: 82 mg/dL (ref 70–99)
Potassium: 2.9 mmol/L — ABNORMAL LOW (ref 3.5–5.1)
Sodium: 141 mmol/L (ref 135–145)

## 2020-12-05 LAB — CBC
HCT: 27.2 % — ABNORMAL LOW (ref 36.0–46.0)
Hemoglobin: 8.2 g/dL — ABNORMAL LOW (ref 12.0–15.0)
MCH: 28.1 pg (ref 26.0–34.0)
MCHC: 30.1 g/dL (ref 30.0–36.0)
MCV: 93.2 fL (ref 80.0–100.0)
Platelets: 123 10*3/uL — ABNORMAL LOW (ref 150–400)
RBC: 2.92 MIL/uL — ABNORMAL LOW (ref 3.87–5.11)
RDW: 15.1 % (ref 11.5–15.5)
WBC: 5.1 10*3/uL (ref 4.0–10.5)
nRBC: 0 % (ref 0.0–0.2)

## 2020-12-05 MED ORDER — POTASSIUM CHLORIDE 10 MEQ/100ML IV SOLN
10.0000 meq | INTRAVENOUS | Status: AC
  Administered 2020-12-05 (×4): 10 meq via INTRAVENOUS
  Filled 2020-12-05 (×4): qty 100

## 2020-12-05 MED ORDER — HYDROMORPHONE HCL 1 MG/ML IJ SOLN
0.5000 mg | INTRAMUSCULAR | Status: DC | PRN
Start: 2020-12-05 — End: 2020-12-15

## 2020-12-05 NOTE — TOC Initial Note (Addendum)
Transition of Care [facility]) - Initial/Assessment Note    Patient Details  Name: Sara Logan MRN: [id number] Date of Birth: 04/18/1933  [facility] CM/SW Contact:    Dessa Phi, RN Phone Number: 12/05/2020, 10:06 AM  Clinical Narrative:Patient from [place], Indep PTA- d/c plan per dtr [name] to return if appropriate.Will await PT recc. 3:30p-PT recc HHPT;will await NGT removal, & since may need SNF with colostomy care.                   Expected Discharge Plan: Assisted Living Barriers to Discharge: Continued Medical Work up   Patient Goals and CMS Choice Patient states their goals for this hospitalization and ongoing recovery are:: return back to [place] CMS Medicare.gov Compare Post Acute Care list provided to:: Patient Represenative (must comment) (dtr Nan [phone]) Choice offered to / list presented to : Adult Children  Expected Discharge Plan and Services Expected Discharge Plan: Assisted Living   Discharge Planning Services: CM Consult Post Acute Care Choice: [place] arrangements for the past 2 months: [place]                                      Prior Living Arrangements/Services Living arrangements for the past 2 months: [place] Lives with:: Facility Resident Patient language and need for interpreter reviewed:: Yes Do you feel safe going back to the place where you live?: Yes      Need for Family Participation in Patient Care: No (Comment) Care giver support system in place?: Yes (comment) Current home services: DME (rw) Criminal Activity/Legal Involvement Pertinent to Current Situation/Hospitalization: No - Comment as needed  Activities of Daily Living Home Assistive Devices/Equipment: Walker (specify type) ADL Screening (condition at time of admission) Patient's cognitive ability adequate to safely complete daily activities?: Yes Is the patient deaf or have  difficulty hearing?: Yes Does the patient have difficulty seeing, even when wearing glasses/contacts?: No Does the patient have difficulty concentrating, remembering, or making decisions?: No Patient able to express need for assistance with ADLs?: Yes Does the patient have difficulty dressing or bathing?: Yes Independently performs ADLs?: No Communication: Independent Dressing (OT): Needs assistance Is this a change from baseline?: Pre-admission baseline Grooming: Independent Feeding: Independent Bathing: Needs assistance Is this a change from baseline?: Pre-admission baseline Toileting: Needs assistance Is this a change from baseline?: Pre-admission baseline In/Out Bed: Needs assistance Is this a change from baseline?: Pre-admission baseline Walks in Home: Needs assistance Is this a change from baseline?: Pre-admission baseline Does the patient have difficulty walking or climbing stairs?: Yes Weakness of Legs: Both Weakness of Arms/Hands: Both  Permission Sought/Granted Permission sought to share information with : Case Manager Permission granted to share information with : Yes, Verbal Permission Granted  Share Information with NAME: Case Manager     Permission granted to share info w Relationship: Liz Pinho [phone]     Emotional Assessment Appearance:: Appears stated age Attitude/Demeanor/Rapport: Gracious Affect (typically observed): Accepting Orientation: : Oriented to Self,Oriented to Place,Oriented to  Time,Oriented to Situation Alcohol / Substance Use: Not Applicable Psych Involvement: No (comment)  Admission diagnosis:  Diverticulitis [K57.92] Pre-op evaluation [Z01.818] Diverticulitis of large intestine with perforation and abscess without bleeding [K57.20] Complete intestinal obstruction, unspecified cause ([place]) [K56.601] Patient Active Problem List   Diagnosis Date Noted  . Abscess of sigmoid colon due to diverticulitis 10/11/2020  .  Diverticulitis  09/28/2020  . Atrophic vaginitis 03/24/2018  . Urine incontinence 03/24/2018  . Hypertensive retinopathy of both eyes 10/14/2017  . Primary osteoarthritis of left knee 06/04/2017  . Near syncope 06/06/2016  . Nausea and vomiting 06/06/2016  . Dyspnea 06/06/2016  . Essential hypertension 10/27/2015  . 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 History of normocytic normochromic anemia 03/18/2015  . Obesity (BMI 30-39.9) 08/07/2013  . Urinary urgency 03/19/2013  . Dermatochalasis 02/20/2013  . PCO (posterior capsular opacification) 02/20/2013  . Osteopenia 08/27/2012  . Pseudophakia 07/18/2012  . Venous stasis 02/06/2012  . 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 Rosacea 07/05/2011  . Vitamin D deficiency 12/08/2010  . Hyperlipidemia LDL goal <70 09/08/2010  . Depression, recurrent ([place]) 09/08/2010   PCP:  Eulas Post, MD Pharmacy:   [facility], [place] [address] Phone: [phone] Fax: [phone]     Social Determinants of Health (SDOH) Interventions    Readmission Risk Interventions No flowsheet data found.

## 2020-12-05 NOTE — Evaluation (Addendum)
Physical Therapy Evaluation Patient Details Name: Sara Logan MRN: [id number] DOB: 02-20-1933 Today's Date: 12/05/2020   History of Present Illness  Patient is an 87 year old female with a past medical history significant for HLD, thrombocytopenia, depression, arthritis, stroke, diverticulitis with abscess formation in January 2022. Admitted 12/02/20 with abdominal pain.  S/P EXPLORATORY LAPAROTOMY; HARTMAN'S PROCEDURE; SPLEENIC FLEXURE MOBILIZATION  COLOSTOMY  Clinical Impression  Patient is very pleasant and motivated to begin mobilizing. Patient  Required mod assistance  For bed mobility and stand and pivot steps to recliner. Patient's daughter  Present.  Patient recently moved into ALF at [facility] . Patient will require assistance at DC. Now having colostomy.  Patient tolerated activity well.  Pt admitted with above diagnosis.  Pt currently with functional limitations due to the deficits listed below (see PT Problem List). Pt will benefit from skilled PT to increase their independence and safety with mobility to allow discharge to the venue listed below.       Follow Up Recommendations Home health PT;SNF    Equipment Recommendations  None recommended by PT    Recommendations for Other Services   OT   Precautions / Restrictions Precautions Precautions: Fall Precaution Comments: colostomy, right JP drain,  NG suction      Mobility  Bed Mobility Overal bed mobility: Needs Assistance Bed Mobility: Rolling;Sidelying to Sit Rolling: Min assist Sidelying to sit: Mod assist       General bed mobility comments: assist with trunk, patient moved legs over bed edge    Transfers Overall transfer level: Needs assistance Equipment used: Rolling walker (2 wheeled) Transfers: Sit to/from [facility] Sit to Stand: Mod assist Stand pivot transfers: Mod assist       General transfer comment: assist to power iup, cues for hand placement, small shuffle steps to  recliner.  Ambulation/Gait                Stairs            Wheelchair Mobility    Modified Rankin (Stroke Patients Only)       Balance Overall balance assessment: Needs assistance Sitting-balance support: Feet supported;No upper extremity supported Sitting balance-Leahy Scale: Fair     Standing balance support: During functional activity;Bilateral upper extremity supported Standing balance-Leahy Scale: Poor Standing balance comment: reliant on RW and UE support and external support.                             Pertinent Vitals/Pain Pain Assessment: Faces Faces Pain Scale: Hurts little more Pain Location: abdomen  when coughs Pain Descriptors / Indicators: Discomfort Pain Intervention(s): Monitored during session (instructed in use of pillow)    Home Living Family/patient expects to be discharged to:: Assisted living               Home Equipment: Walker - 4 wheels Additional Comments: recently moved to ALF at [facility], ambulates with RW,    Prior Function Level of Independence: Independent with assistive device(s);Needs assistance   Gait / Transfers Assistance Needed: uses Rw, ambulates to dining room.           Hand Dominance   Dominant Hand: Right    Extremity/Trunk Assessment   Upper Extremity Assessment Upper Extremity Assessment: Overall WFL for tasks assessed    Lower Extremity Assessment Lower Extremity Assessment: Generalized weakness    Cervical / Trunk Assessment Cervical / Trunk Assessment: Kyphotic  Communication   Communication: No difficulties  Cognition Arousal/Alertness: Awake/alert Behavior During Therapy: WFL for tasks assessed/performed Overall Cognitive Status: Within Functional Limits for tasks assessed                                 General Comments: reports hallucinating overnight      General Comments      Exercises     Assessment/Plan    PT Assessment Patient  needs continued PT services  PT Problem List Decreased strength;Decreased mobility;Decreased safety awareness       PT Treatment Interventions DME instruction;Therapeutic activities;Gait training;Therapeutic exercise;Patient/family education;Functional mobility training    PT Goals (Current goals can be found in the Care Plan section)  Acute Rehab PT Goals Patient Stated Goal: return to ALF and be able to walk PT Goal Formulation: With patient/family Time For Goal Achievement: 12/19/20 Potential to Achieve Goals: Good    Frequency Min 3X/week   Barriers to discharge        Co-evaluation               AM-PAC PT "6 Clicks" Mobility  Outcome Measure Help needed turning from your back to your side while in a flat bed without using bedrails?: A Lot Help needed moving from lying on your back to sitting on the side of a flat bed without using bedrails?: A Lot Help needed moving to and from a bed to a chair (including a wheelchair)?: A Lot Help needed standing up from a chair using your arms (e.g., wheelchair or bedside chair)?: A Lot Help needed to walk in hospital room?: A Lot Help needed climbing 3-5 steps with a railing? : Total 6 Click Score: 11    End of Session Equipment Utilized During Treatment: Gait belt Activity Tolerance: Patient tolerated treatment well Patient left: in chair;with call bell/phone within reach;with family/visitor present;with nursing/sitter in room Nurse Communication: Mobility status PT Visit Diagnosis: Unsteadiness on feet (R26.81);Difficulty in walking, not elsewhere classified (R26.2);Pain    Time: 1610-9604 PT Time Calculation (min) (ACUTE ONLY): 41 min   Charges:   PT Evaluation $PT Eval Low Complexity: 1 Low PT Treatments $Therapeutic Activity: 23-37 mins        Tresa Endo PT Acute Rehabilitation Services Pager [phone] Office [phone]   Claretha Cooper 12/05/2020, 1:27 PM

## 2020-12-05 NOTE — Plan of Care (Signed)
  Problem: Safety: Goal: Ability to remain free from injury will improve Outcome: Progressing    Problem: Nutrition: Goal: Adequate nutrition will be maintained Outcome: Progressing   Problem: Elimination: Goal: Will not experience complications related to bowel motility Outcome: Progressing Goal: Will not experience complications related to urinary retention Outcome: Progressing

## 2020-12-05 NOTE — Care Management Important Message (Signed)
Important Message  Patient Details IM Letter given to the Patient. Name: Sara Logan MRN: [id number] Date of Birth: Feb 14, 1933   Medicare Important Message Given:  Yes     Kerin Salen 12/05/2020, 12:47 PM

## 2020-12-05 NOTE — Progress Notes (Signed)
2 Days Post-Op open Hartman's Subjective: Groggy this morning, reports pain when she coughs  Objective: Vital signs in last 24 hours: Temp:  [98.2 F (36.8 C)-98.7 F (37.1 C)] 98.7 F (37.1 C) (03/28 0546) Pulse Rate:  [98-106] 98 (03/28 0546) Resp:  [19-24] 20 (03/28 0546) BP: (108-138)/(51-63) 108/57 (03/28 0546) SpO2:  [91 %-98 %] 91 % (03/28 0546)   Intake/Output from previous day: 03/27 0701 - 03/28 0700 In: 2916.4 [I.V.:2709.8; IV Piggyback:206.7] Out: 2250 [Urine:900; Emesis/NG output:1150; Drains:200] Intake/Output this shift: No intake/output data recorded.   General appearance: Sleepy but awakens to voice GI: Soft, distended.  Left upper quadrant stoma is viable, with small amount of ostomy sweat  Incision: no significant drainage  Lab Results:  Recent Labs    12/04/20 1111 12/05/20 0428  WBC 7.9 5.1  HGB 8.7* 8.2*  HCT 27.8* 27.2*  PLT 154 123*   BMET Recent Labs    12/04/20 1111 12/05/20 0428  NA 138 141  K 3.3* 2.9*  CL 106 110  CO2 26 25  GLUCOSE 122* 82  BUN 29* 22  CREATININE 0.85 0.70  CALCIUM 7.9* 8.1*   PT/INR Recent Labs    12/03/20 0207  LABPROT 16.1*  INR 1.3*   ABG No results for input(s): PHART, HCO3 in the last 72 hours.  Invalid input(s): PCO2, PO2  MEDS, Scheduled . chlorhexidine  15 mL Mouth Rinse BID  . Chlorhexidine Gluconate Cloth  6 each Topical Daily  . enoxaparin (LOVENOX) injection  40 mg Subcutaneous Q24H  . mouth rinse  15 mL Mouth Rinse q12n4p  . pantoprazole (PROTONIX) IV  40 mg Intravenous QHS    Studies/Results: No results found.  Assessment: s/p Procedure(s): EXPLORATORY LAPAROTOMY; HARTMAN'S PROCEDURE; SPLEENIC FLEXURE MOBILIZATION COLOSTOMY Patient Active Problem List   Diagnosis Date Noted  . Abscess of sigmoid colon due to diverticulitis 10/11/2020  . Diverticulitis 09/28/2020  . Atrophic vaginitis 03/24/2018  . Urine incontinence 03/24/2018  . Hypertensive retinopathy of both eyes  10/14/2017  . Primary osteoarthritis of left knee 06/04/2017  . Near syncope 06/06/2016  . Nausea and vomiting 06/06/2016  . Dyspnea 06/06/2016  . Essential hypertension 10/27/2015  . 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 History of normocytic normochromic anemia 03/18/2015  . Obesity (BMI 30-39.9) 08/07/2013  . Urinary urgency 03/19/2013  . Dermatochalasis 02/20/2013  . PCO (posterior capsular opacification) 02/20/2013  . Osteopenia 08/27/2012  . Pseudophakia 07/18/2012  . Venous stasis 02/06/2012  . 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 Rosacea 07/05/2011  . Vitamin D deficiency 12/08/2010  . Hyperlipidemia LDL goal <70 09/08/2010  . Depression, recurrent ([place]) 09/08/2010    Expected post op course  Plan: Cont NG  Postop day 2 status post Hartman's procedure for obstructing diverticulitis, Dr. Marcello Moores  Continue NG to suction, ileus is anticipated Continue pelvic drain for at least 1-2 weeks Hypokalemia 2.9-replace IV, recheck labs tomorrow PT eval/mobilize   LOS: 3 days    [facility], [place]    12/05/2020 10:29 AM

## 2020-12-06 LAB — BASIC METABOLIC PANEL
Anion gap: 9 (ref 5–15)
BUN: 12 mg/dL (ref 8–23)
CO2: 22 mmol/L (ref 22–32)
Calcium: 7.9 mg/dL — ABNORMAL LOW (ref 8.9–10.3)
Chloride: 110 mmol/L (ref 98–111)
Creatinine, Ser: 0.58 mg/dL (ref 0.44–1.00)
GFR, Estimated: >60 mL/min (ref >60)
Glucose, Bld: 57 mg/dL — ABNORMAL LOW (ref 70–99)
Potassium: 2.6 mmol/L — CL (ref 3.5–5.1)
Sodium: 141 mmol/L (ref 135–145)

## 2020-12-06 LAB — CBC
HCT: 25.6 % — ABNORMAL LOW (ref 36.0–46.0)
Hemoglobin: 7.6 g/dL — ABNORMAL LOW (ref 12.0–15.0)
MCH: 27.8 pg (ref 26.0–34.0)
MCHC: 29.7 g/dL — ABNORMAL LOW (ref 30.0–36.0)
MCV: 93.8 fL (ref 80.0–100.0)
Platelets: 132 10*3/uL — ABNORMAL LOW (ref 150–400)
RBC: 2.73 MIL/uL — ABNORMAL LOW (ref 3.87–5.11)
RDW: 15.1 % (ref 11.5–15.5)
WBC: 4.1 10*3/uL (ref 4.0–10.5)
nRBC: 0 % (ref 0.0–0.2)

## 2020-12-06 LAB — POTASSIUM: Potassium: 2.9 mmol/L — ABNORMAL LOW (ref 3.5–5.1)

## 2020-12-06 LAB — MAGNESIUM: Magnesium: 1.8 mg/dL (ref 1.7–2.4)

## 2020-12-06 LAB — SURGICAL PATHOLOGY

## 2020-12-06 MED ORDER — POTASSIUM CHLORIDE 10 MEQ/100ML IV SOLN
INTRAVENOUS | Status: AC
  Administered 2020-12-06: 10 meq via INTRAVENOUS
  Filled 2020-12-06: qty 100

## 2020-12-06 MED ORDER — POTASSIUM CHLORIDE 10 MEQ/100ML IV SOLN: 10.0000 meq | Freq: Once | INTRAVENOUS | Status: AC

## 2020-12-06 MED ORDER — POTASSIUM CHLORIDE 10 MEQ/100ML IV SOLN
10.0000 meq | INTRAVENOUS | Status: AC
  Administered 2020-12-06 (×5): 10 meq via INTRAVENOUS
  Filled 2020-12-06 (×5): qty 100

## 2020-12-06 MED ORDER — METHOCARBAMOL 1000 MG/10ML IJ SOLN
500.0000 mg | Freq: Four times a day (QID) | INTRAVENOUS | Status: DC | PRN
  Filled 2020-12-06: qty 5

## 2020-12-06 MED ORDER — POTASSIUM CHLORIDE 10 MEQ/100ML IV SOLN: 10.0000 meq | INTRAVENOUS | Status: DC

## 2020-12-06 MED ORDER — MAGNESIUM SULFATE 2 GM/50ML IV SOLN
2.0000 g | Freq: Once | INTRAVENOUS | Status: AC
  Administered 2020-12-06: 2 g via INTRAVENOUS
  Filled 2020-12-06: qty 50

## 2020-12-06 MED ORDER — ACETAMINOPHEN 10 MG/ML IV SOLN
1000.0000 mg | Freq: Four times a day (QID) | INTRAVENOUS | Status: AC
  Administered 2020-12-06 – 2020-12-07 (×4): 1000 mg via INTRAVENOUS
  Filled 2020-12-06 (×4): qty 100

## 2020-12-06 NOTE — Consult Note (Signed)
[facility] Nurse ostomy consult note Stoma type/location: LLQ colostomy created emergently by Dr. Marcello Moores on 12/03/2020. Stomal assessment/size: 2 inches from side to side, slightly smaller from top to bottom. Stoma is red, moist, lumen in center, budded and edematous Peristomal assessment: Intact Treatment options for stomal/peristomal skin: skin barrier ring Output: small amount of old stool, minimal flatus Ostomy pouching: 2pc. 2 and 3/4 inch pouching system with skin barrier ring Education provided:  Explained role of ostomy nurse and creation of stoma  Explained stoma characteristics (budded, flush, color, texture, care) Reassured that she will be able to perform ostomy care in time.  Answered patient questions, which are primarily about discharge.  Patient in bed and not able to visualize stoma today. Also, her bilateral hands are very edematous and are elevated on pillows. She is not able to perform the Lock and Roll closure today.  NOTE:  Patient with NGT in place, also [place] for urinary incontinence. She lives in an ALF and hopes to return to that facility, and acknowledges that she may need to spend a short time in the infirmary (nursing center) until she can return to her apartment.  She informs me that she is certain she can manage this independently in time and I agree.  Her daughter lives in [place] and also has a stoma (an ileostomy), created many years ago for UC.  Today, the patient asks me to contact her daughter and I do, engaging in a therapeutic conversation explaining a few differences between an ileostomy and a colostomy and providing a description of her mothers ostomy and pouching system today. The daughter reports that she orders her supplies from [place].  Both are pleased with the interaction and thank me for my time today.    The patient also asks that I contact [facility] and inform Caleb Popp, the Head Nurse there that a box will be coming to the facility to the  attention of the patient. In this way, she is relieved that Ms. Arlyce Dice will be looking out for the package and will put it into her apartment.  Enrolled patient in [place] program: Yes, today.  Total time spent with patient inclusive of documentation is 65 minutes.  [facility] nursing team will  follow, and will remain available to this patient, the nursing and medical teams.   Thanks, Maudie Flakes, MSN, RN, [place], Arther Abbott  Pager# [phone]

## 2020-12-06 NOTE — Evaluation (Signed)
Occupational Therapy Evaluation Patient Details Name: Sara Logan MRN: [id number] DOB: 10-19-1932 Today's Date: 12/06/2020    History of Present Illness Patient is an 87 year old female with a past medical history significant for HLD, thrombocytopenia, depression, arthritis, stroke, diverticulitis with abscess formation in January 2022. Admitted 12/02/20 with abdominal pain.  S/P EXPLORATORY LAPAROTOMY; HARTMAN'S PROCEDURE; SPLEENIC FLEXURE MOBILIZATION  COLOSTOMY   Clinical Impression   Patient recently moved to an assisted living facility where she ambulates to dining room with rollator, and is mod I with basic ADLs. Currently patient limited by pain, decreased activity tolerance, balance and safety awareness. Patient also with new colostomy. Patient needed cues for safe hand placement during functional transfers, mod A for LB dressing and set up A for UB ADLs. Recommend continued acute OT services for ADL retraining, endurance and balance training in order to facilitate D/C to venue listed below.    Follow Up Recommendations  SNF    Equipment Recommendations  None recommended by OT       Precautions / Restrictions Precautions Precautions: Fall Precaution Comments: colostomy, right JP drain,  NG suction Restrictions Weight Bearing Restrictions: No      Mobility Bed Mobility Overal bed mobility: Needs Assistance Bed Mobility: Rolling;Sidelying to Sit Rolling: Min guard Sidelying to sit: Min assist;HOB elevated       General bed mobility comments: cues for log roll technique, min A for trunk support    Transfers Overall transfer level: Needs assistance Equipment used: Rolling walker (2 wheeled) Transfers: Sit to/from [facility] Sit to Stand: Min assist Stand pivot transfers: Min assist       General transfer comment: min A for steadying, cues to for hand placement during sit <> stand    Balance Overall balance assessment: Needs  assistance Sitting-balance support: Feet supported;No upper extremity supported Sitting balance-Leahy Scale: Good     Standing balance support: During functional activity;Bilateral upper extremity supported Standing balance-Leahy Scale: Poor Standing balance comment: reliant on UE support                           ADL either performed or assessed with clinical judgement   ADL Overall ADL's : Needs assistance/impaired     Grooming: Set up;Sitting   Upper Body Bathing: Set up;Sitting   Lower Body Bathing: Moderate assistance;Sitting/lateral leans;Sit to/from stand   Upper Body Dressing : Set up;Sitting   Lower Body Dressing: Moderate assistance;Sitting/lateral leans Lower Body Dressing Details (indicate cue type and reason): patient able to don L sock using figure 4 method, unable with R hx of knee replacement and limited mobility of joints needing assist to don R sock Toilet Transfer: Minimal assistance;Stand-pivot;Cueing for safety;Cueing for sequencing;BSC;RW Toilet Transfer Details (indicate cue type and reason): to recliner. min A for stability and cues to reach back when sitting Toileting- Clothing Manipulation and Hygiene: Moderate assistance;Sitting/lateral lean;Sit to/from stand       Functional mobility during ADLs: Minimal assistance;Cueing for safety;Cueing for sequencing;Rolling walker General ADL Comments: patient requiring increased assistance with self care due to decreased activity tolerance, global strength, and increased pain with mobility                  Pertinent Vitals/Pain Pain Assessment: Faces Faces Pain Scale: Hurts a little bit Pain Location: NG tube Pain Descriptors / Indicators: Discomfort Pain Intervention(s): Monitored during session     Hand Dominance Right   Extremity/Trunk Assessment Upper Extremity Assessment Upper Extremity Assessment: Generalized weakness  Lower Extremity Assessment Lower Extremity Assessment:  Defer to PT evaluation       Communication Communication Communication: No difficulties   Cognition Arousal/Alertness: Awake/alert Behavior During Therapy: WFL for tasks assessed/performed Overall Cognitive Status: Within Functional Limits for tasks assessed                                                Home Living Family/patient expects to be discharged to:: Assisted living                             Home Equipment: Walker - 4 wheels   Additional Comments: recently moved to ALF at [facility], ambulates with RW,      Prior Functioning/Environment Level of Independence: Independent with assistive device(s)  Gait / Transfers Assistance Needed: uses Rw, ambulates to dining room.     Comments: patient reports has been independent with BADLs        OT Problem List: Decreased strength;Decreased activity tolerance;Impaired balance (sitting and/or standing);Decreased safety awareness;Pain      OT Treatment/Interventions: Self-care/ADL training;Therapeutic exercise;DME and/or AE instruction;Therapeutic activities;Patient/family education;Balance training    OT Goals(Current goals can be found in the care plan section) Acute Rehab OT Goals Patient Stated Goal: return to ALF and be able to walk OT Goal Formulation: With patient Time For Goal Achievement: 12/20/20 Potential to Achieve Goals: Good ADL Goals Pt Will Perform Lower Body Dressing: with supervision;with adaptive equipment;sit to/from stand;sitting/lateral leans Pt Will Transfer to Toilet: with supervision;ambulating;regular height toilet (walker) Pt Will Perform Toileting - Clothing Manipulation and hygiene: with supervision;sit to/from stand;sitting/lateral leans Additional ADL Goal #1: Patient will tolerate 8 min standing activity at supervision level in order to participate in self care tasks.  OT Frequency: Min 2X/week    AM-PAC OT "6 Clicks" Daily Activity     Outcome Measure  Help from another person eating meals?: None Help from another person taking care of personal grooming?: A Little Help from another person toileting, which includes using toliet, bedpan, or urinal?: A Lot Help from another person bathing (including washing, rinsing, drying)?: A Lot Help from another person to put on and taking off regular upper body clothing?: A Little Help from another person to put on and taking off regular lower body clothing?: A Lot 6 Click Score: 16   End of Session Equipment Utilized During Treatment: Gait belt;Rolling walker Nurse Communication: Mobility status  Activity Tolerance: Patient tolerated treatment well Patient left: in chair;with call bell/phone within reach;with nursing/sitter in room  OT Visit Diagnosis: Other abnormalities of gait and mobility (R26.89);Muscle weakness (generalized) (M62.81);Pain Pain - part of body:  (abdomen)                Time: 1610-9604 OT Time Calculation (min): 29 min Charges:  OT General Charges $OT Visit: 1 Visit OT Treatments $Self Care/Home Management : 8-22 mins  Delbert Phenix OT OT pager: [phone]  Rosemary Holms 12/06/2020, 3:09 PM

## 2020-12-06 NOTE — Progress Notes (Signed)
[facility] Progress Note  3 Days Post-Op  Subjective: CC-  Feeling well this morning. Abdomen a little sore but pain well controlled. Denies n/v. Ostomy productive.  Ambulated with PT yesterday, recommending HH PT vs SNF.  Objective: Vital signs in last 24 hours: Temp:  [97.7 F (36.5 C)-98.5 F (36.9 C)] 97.7 F (36.5 C) (03/29 0521) Pulse Rate:  [87-95] 90 (03/29 0521) Resp:  [18-20] 18 (03/29 0521) BP: (120-133)/(68-83) 133/76 (03/29 0521) SpO2:  [97 %-98 %] 97 % (03/29 0521) Last BM Date: 12/04/20  Intake/Output from previous day: 03/28 0701 - 03/29 0700 In: 3011.4 [I.V.:2869.4; IV Piggyback:142] Out: 1619 [Urine:904; Emesis/NG output:300; Drains:265; Stool:150] Intake/Output this shift: Total I/O In: -  Out: 200 [Urine:200]  PE: Gen:  Alert, NAD, pleasant Pulm:  rate and effort normal on room air Abd: Soft, distended, nontender, +BS, midline incision cdi without erythema or drainage, stoma viable with liquid brown stool in pouch, JP drain with serosanguinous fluid in bulb  Lab Results:  Recent Labs    12/05/20 0428 12/06/20 0508  WBC 5.1 4.1  HGB 8.2* 7.6*  HCT 27.2* 25.6*  PLT 123* 132*   BMET Recent Labs    12/05/20 0428 12/06/20 0508  NA 141 141  K 2.9* 2.6*  CL 110 110  CO2 25 22  GLUCOSE 82 57*  BUN 22 12  CREATININE 0.70 0.58  CALCIUM 8.1* 7.9*   PT/INR No results for input(s): LABPROT, INR in the last 72 hours. CMP     Component Value Date/Time   NA 141 12/06/2020 0508   K 2.6 (LL) 12/06/2020 0508   CL 110 12/06/2020 0508   CO2 22 12/06/2020 0508   GLUCOSE 57 (L) 12/06/2020 0508   BUN 12 12/06/2020 0508   CREATININE 0.58 12/06/2020 0508   CREATININE 0.67 04/26/2020 1158   CALCIUM 7.9 (L) 12/06/2020 0508   PROT 7.1 12/02/2020 1143   ALBUMIN 3.0 (L) 12/02/2020 1143   AST 13 (L) 12/02/2020 1143   ALT 11 12/02/2020 1143   ALKPHOS 60 12/02/2020 1143   BILITOT 0.9 12/02/2020 1143   GFRNONAA >60 12/06/2020 0508   GFRAA  >60 06/07/2016 0530   Lipase     Component Value Date/Time   LIPASE 32 12/02/2020 1143       Studies/Results: No results found.  Anti-infectives: Anti-infectives (From admission, onward)   Start     Dose/Rate Route Frequency Ordered Stop   12/02/20 2200  piperacillin-tazobactam (ZOSYN) IVPB 3.375 g        3.375 g 12.5 mL/hr over 240 Minutes Intravenous Every 8 hours 12/02/20 1655     12/02/20 1600  piperacillin-tazobactam (ZOSYN) IVPB 3.375 g        3.375 g 100 mL/hr over 30 Minutes Intravenous  Once 12/02/20 1557 12/02/20 1646       Assessment/Plan HTN HLD Depression Severe protein calorie malnutrition - prealbumin 7.7 (3/26) Hx of CVA without residual deficit  Physical deconditioning  ABL anemia - Hgb 7.6 from 8.2, monitor Hypokalemia/ hypomagnesemia - repeat K and Mag, recheck K at 1500  Diverticulitis with distal sigmoid obstruction S/p EXPLORATORY LAPAROTOMY; HARTMAN'S PROCEDURE; SPLEENIC FLEXURE MOBILIZATION, END COLOSTOMY 3/26 Dr. Marcello Moores - POD#3 - surgical path pending - continue pelvic JP drain at least 1-2 weeks - continue PT/OT, mobilize, [facility] PT vs SNF depending on progress - WOC consult for new ostomy education - Intermittently clamp NG tube today: clamp x4 hours, unclamp x1 hour, unclamp x4 hours... if patient becomes nauseated or distended then  return NG tube LIWS  FEN: IVF, intermittently clamp NG and allow sips of clears VTE: SCDs, lovenox ID: [id number] 3/25>> day#4 Point of contact: Lizzeth Meder [phone] Follow up: Dr. Marcello Moores   LOS: 4 days    [place], [facility] 12/06/2020, 9:04 AM Please see Amion for pager number during day hours 7:00am-4:30pm

## 2020-12-07 LAB — MAGNESIUM: Magnesium: 1.8 mg/dL (ref 1.7–2.4)

## 2020-12-07 LAB — CBC
HCT: 26.4 % — ABNORMAL LOW (ref 36.0–46.0)
Hemoglobin: 8.2 g/dL — ABNORMAL LOW (ref 12.0–15.0)
MCH: 28.5 pg (ref 26.0–34.0)
MCHC: 31.1 g/dL (ref 30.0–36.0)
MCV: 91.7 fL (ref 80.0–100.0)
Platelets: 167 10*3/uL (ref 150–400)
RBC: 2.88 MIL/uL — ABNORMAL LOW (ref 3.87–5.11)
RDW: 15 % (ref 11.5–15.5)
WBC: 7.1 10*3/uL (ref 4.0–10.5)
nRBC: 0 % (ref 0.0–0.2)

## 2020-12-07 LAB — BASIC METABOLIC PANEL
Anion gap: 14 (ref 5–15)
BUN: 6 mg/dL — ABNORMAL LOW (ref 8–23)
CO2: 17 mmol/L — ABNORMAL LOW (ref 22–32)
Calcium: 7.8 mg/dL — ABNORMAL LOW (ref 8.9–10.3)
Chloride: 106 mmol/L (ref 98–111)
Creatinine, Ser: 0.53 mg/dL (ref 0.44–1.00)
GFR, Estimated: >60 mL/min (ref >60)
Glucose, Bld: 54 mg/dL — ABNORMAL LOW (ref 70–99)
Potassium: 2.8 mmol/L — ABNORMAL LOW (ref 3.5–5.1)
Sodium: 137 mmol/L (ref 135–145)

## 2020-12-07 MED ORDER — POTASSIUM CHLORIDE 10 MEQ/100ML IV SOLN
10.0000 meq | INTRAVENOUS | Status: AC
  Administered 2020-12-07 (×6): 10 meq via INTRAVENOUS
  Filled 2020-12-07 (×6): qty 100

## 2020-12-07 MED ORDER — ACETAMINOPHEN 325 MG PO TABS
650.0000 mg | ORAL_TABLET | Freq: Four times a day (QID) | ORAL | Status: DC
  Filled 2020-12-07 (×2): qty 2

## 2020-12-07 MED ORDER — MAGNESIUM SULFATE 2 GM/50ML IV SOLN
2.0000 g | Freq: Once | INTRAVENOUS | Status: AC
  Administered 2020-12-07: 2 g via INTRAVENOUS
  Filled 2020-12-07: qty 50

## 2020-12-07 NOTE — Progress Notes (Signed)
[facility] Progress Note  4 Days Post-Op  Subjective: CC-  Feeling well. Wants to mobilize more today. Denies abdominal pain, nausea, vomiting. Tolerated intermittent NG tube clamping trials yesterday. Ostomy productive. Patient would prefer stent in SNF prior to returning home to ALF. States that her ALF does have a SNF section.  Objective: Vital signs in last 24 hours: Temp:  [97.6 F (36.4 C)-98.2 F (36.8 C)] 97.6 F (36.4 C) (03/30 0423) Pulse Rate:  [81-86] 83 (03/30 0423) Resp:  [15-20] 18 (03/30 0423) BP: (125-150)/(52-75) 150/75 (03/30 0423) SpO2:  [99 %] 99 % (03/30 0423) Last BM Date: 12/04/20  Intake/Output from previous day: 03/29 0701 - 03/30 0700 In: 2659.8 [P.O.:60; I.V.:2156.3; IV Piggyback:443.5] Out: 2360 [Urine:1550; Emesis/NG output:400; Drains:360; Stool:50] Intake/Output this shift: No intake/output data recorded.  PE: Gen:  Alert, NAD, pleasant Pulm:  rate and effort normal on room air Abd: Soft, mild distension, nontender, +BS, midline incision cdi with staples present and no erythema or drainage, stoma viable with sweat and liquid brown stool in pouch, JP drain with serosanguinous fluid in bulb  Lab Results:  Recent Labs    12/06/20 0508 12/07/20 0530  WBC 4.1 7.1  HGB 7.6* 8.2*  HCT 25.6* 26.4*  PLT 132* 167   BMET Recent Labs    12/06/20 0508 12/06/20 2033 12/07/20 0530  NA 141  --  137  K 2.6* 2.9* 2.8*  CL 110  --  106  CO2 22  --  17*  GLUCOSE 57*  --  54*  BUN 12  --  6*  CREATININE 0.58  --  0.53  CALCIUM 7.9*  --  7.8*   PT/INR No results for input(s): LABPROT, INR in the last 72 hours. CMP     Component Value Date/Time   NA 137 12/07/2020 0530   K 2.8 (L) 12/07/2020 0530   CL 106 12/07/2020 0530   CO2 17 (L) 12/07/2020 0530   GLUCOSE 54 (L) 12/07/2020 0530   BUN 6 (L) 12/07/2020 0530   CREATININE 0.53 12/07/2020 0530   CREATININE 0.67 04/26/2020 1158   CALCIUM 7.8 (L) 12/07/2020 0530   PROT 7.1  12/02/2020 1143   ALBUMIN 3.0 (L) 12/02/2020 1143   AST 13 (L) 12/02/2020 1143   ALT 11 12/02/2020 1143   ALKPHOS 60 12/02/2020 1143   BILITOT 0.9 12/02/2020 1143   GFRNONAA >60 12/07/2020 0530   GFRAA >60 06/07/2016 0530   Lipase     Component Value Date/Time   LIPASE 32 12/02/2020 1143       Studies/Results: No results found.  Anti-infectives: Anti-infectives (From admission, onward)   Start     Dose/Rate Route Frequency Ordered Stop   12/02/20 2200  piperacillin-tazobactam (ZOSYN) IVPB 3.375 g        3.375 g 12.5 mL/hr over 240 Minutes Intravenous Every 8 hours 12/02/20 1655     12/02/20 1600  piperacillin-tazobactam (ZOSYN) IVPB 3.375 g        3.375 g 100 mL/hr over 30 Minutes Intravenous  Once 12/02/20 1557 12/02/20 1646       Assessment/Plan HTN HLD Depression Severe protein calorie malnutrition - prealbumin 7.7 (3/26) Hx of CVA without residual deficit  Physical deconditioning  ABL anemia - Hgb 8.2, stable Hypokalemia/ hypomagnesemia - repeat K and Mag, recheck labs in AM  Diverticulitis with distal sigmoid obstruction S/p EXPLORATORY LAPAROTOMY; HARTMAN'S PROCEDURE; SPLEENIC FLEXURE MOBILIZATION, ENDCOLOSTOMY 3/26 Dr. Marcello Moores - POD#4 - surgical path: Diverticulosis with acute diverticulitis and abscess, serosal adhesions,  transmural defect; Ischemic-type change, serosal adhesions, transmural defects - continue pelvic JP drain at least 1-2 weeks - output serosanguinous - continue PT/OT, mobilize. TOC consult for assistance looking into SNF - [facility] following for new ostomy education - Clamp NG tube this morning and allow sips of clears. If patient becomes nauseated or distended then return NG tube LIWS. Plan to recheck later today to see if NG tube can be removed.   FEN: decrease IVF 50cc/hr, clamp NG, sips of clears VTE: SCDs, lovenox ID: Zosyn 3/25>> day#5/5 Point of contact: Patina Spanier [phone] Follow up: Dr. Marcello Moores    LOS: 5 days    Wellington Hampshire, [facility] 12/07/2020, 8:43 AM Please see Amion for pager number during day hours 7:00am-4:30pm

## 2020-12-07 NOTE — Consult Note (Signed)
[place] Nurse ostomy follow up Stoma type/location: LLQ colostomy. Dr. Marcello Moores in during my visit today for stoma and midline incision assessment. Stomal assessment/size: 2 inches, red, raised, moist, edematous, lumen in center. Peristomal assessment: Not seen today Treatment options for stomal/peristomal skin: skin barrier ring Output: soft brown stool in pouch with air Ostomy pouching: 2pc. 2 and 3/4 inch with skin barrier ring Education provided: Patient is not able to visualize pouching system while in bed, her hands are still too edematous to perform/practice with Lock and Roll closure today. Today we discuss diet and change frequency.  Enrolled patient in [place] Start Discharge program: Yes.  May need to call and request closed end pouching samples.  My partners will see in my absence. I will return Monday ad see if patient still in house.  Supplies for discharge as well (as her [employer]) are in the cupboard above the skin in her room. Pouches, skin barriers and skin barrier rings.  [place] nursing team will follow, and will remain available to this patient, the nursing and medical teams.   Thanks, Maudie Flakes, MSN, RN, [place], Arther Abbott  Pager# [phone]

## 2020-12-08 ENCOUNTER — Inpatient Hospital Stay: Payer: Self-pay

## 2020-12-08 LAB — BASIC METABOLIC PANEL
Anion gap: 15 (ref 5–15)
BUN: <5 mg/dL — ABNORMAL LOW (ref 8–23)
CO2: 13 mmol/L — ABNORMAL LOW (ref 22–32)
Calcium: 7.9 mg/dL — ABNORMAL LOW (ref 8.9–10.3)
Chloride: 105 mmol/L (ref 98–111)
Creatinine, Ser: 0.56 mg/dL (ref 0.44–1.00)
GFR, Estimated: >60 mL/min (ref >60)
Glucose, Bld: 61 mg/dL — ABNORMAL LOW (ref 70–99)
Potassium: 2.8 mmol/L — ABNORMAL LOW (ref 3.5–5.1)
Sodium: 133 mmol/L — ABNORMAL LOW (ref 135–145)

## 2020-12-08 LAB — CBC
HCT: 29.2 % — ABNORMAL LOW (ref 36.0–46.0)
Hemoglobin: 8.9 g/dL — ABNORMAL LOW (ref 12.0–15.0)
MCH: 28 pg (ref 26.0–34.0)
MCHC: 30.5 g/dL (ref 30.0–36.0)
MCV: 91.8 fL (ref 80.0–100.0)
Platelets: 204 10*3/uL (ref 150–400)
RBC: 3.18 MIL/uL — ABNORMAL LOW (ref 3.87–5.11)
RDW: 15.1 % (ref 11.5–15.5)
WBC: 10 10*3/uL (ref 4.0–10.5)
nRBC: 0 % (ref 0.0–0.2)

## 2020-12-08 LAB — MAGNESIUM: Magnesium: 1.9 mg/dL (ref 1.7–2.4)

## 2020-12-08 LAB — GLUCOSE, CAPILLARY: Glucose-Capillary: 145 mg/dL — ABNORMAL HIGH (ref 70–99)

## 2020-12-08 MED ORDER — ACETAMINOPHEN 325 MG PO TABS
650.0000 mg | ORAL_TABLET | Freq: Four times a day (QID) | ORAL | Status: DC | PRN
  Administered 2020-12-11 – 2020-12-14 (×6): 650 mg via ORAL
  Filled 2020-12-08 (×6): qty 2

## 2020-12-08 MED ORDER — SODIUM CHLORIDE 0.9% FLUSH
10.0000 mL | Freq: Two times a day (BID) | INTRAVENOUS | Status: DC
  Administered 2020-12-08 – 2020-12-13 (×3): 10 mL

## 2020-12-08 MED ORDER — CHLORHEXIDINE GLUCONATE CLOTH 2 % EX PADS
6.0000 | MEDICATED_PAD | Freq: Every day | CUTANEOUS | Status: DC
  Administered 2020-12-08 – 2020-12-15 (×8): 6 via TOPICAL

## 2020-12-08 MED ORDER — KCL IN DEXTROSE-NACL 40-5-0.9 MEQ/L-%-% IV SOLN
INTRAVENOUS | Status: DC
  Filled 2020-12-08 (×10): qty 1000

## 2020-12-08 MED ORDER — POTASSIUM CHLORIDE 10 MEQ/100ML IV SOLN
10.0000 meq | INTRAVENOUS | Status: AC
  Administered 2020-12-08 (×6): 10 meq via INTRAVENOUS
  Filled 2020-12-08 (×6): qty 100

## 2020-12-08 MED ORDER — TRAVASOL 10 % IV SOLN
INTRAVENOUS | Status: AC
  Filled 2020-12-08: qty 360

## 2020-12-08 MED ORDER — INSULIN ASPART 100 UNIT/ML ~~LOC~~ SOLN
0.0000 [IU] | Freq: Three times a day (TID) | SUBCUTANEOUS | Status: DC
  Administered 2020-12-08: 1 [IU] via SUBCUTANEOUS
  Administered 2020-12-09: 2 [IU] via SUBCUTANEOUS
  Administered 2020-12-09: 1 [IU] via SUBCUTANEOUS
  Administered 2020-12-09: 2 [IU] via SUBCUTANEOUS
  Administered 2020-12-10: 1 [IU] via SUBCUTANEOUS
  Administered 2020-12-10: 2 [IU] via SUBCUTANEOUS
  Administered 2020-12-10 – 2020-12-13 (×7): 1 [IU] via SUBCUTANEOUS

## 2020-12-08 MED ORDER — MAGNESIUM SULFATE 2 GM/50ML IV SOLN
2.0000 g | Freq: Once | INTRAVENOUS | Status: AC
  Administered 2020-12-08: 2 g via INTRAVENOUS
  Filled 2020-12-08: qty 50

## 2020-12-08 MED ORDER — ACETAMINOPHEN 10 MG/ML IV SOLN
1000.0000 mg | Freq: Four times a day (QID) | INTRAVENOUS | Status: AC
  Administered 2020-12-08 – 2020-12-09 (×4): 1000 mg via INTRAVENOUS
  Filled 2020-12-08 (×4): qty 100

## 2020-12-08 MED ORDER — SODIUM CHLORIDE 0.9% FLUSH
10.0000 mL | INTRAVENOUS | Status: DC | PRN
  Administered 2020-12-08 – 2020-12-14 (×2): 10 mL

## 2020-12-08 NOTE — Care Management Important Message (Signed)
Important Message  Patient Details IM Letter given to the Patient. Name: GENEVEIVE FURNESS MRN: [id number] Date of Birth: September 16, 1932   Medicare Important Message Given:  Yes     Kerin Salen 12/08/2020, 11:53 AM

## 2020-12-08 NOTE — Progress Notes (Signed)
Physical Therapy Treatment Patient Details Name: Sara Logan MRN: [id number] DOB: 12-Aug-1933 Today's Date: 12/08/2020    History of Present Illness Patient is an 87 year old female with a past medical history significant for HLD, thrombocytopenia, depression, arthritis, stroke, diverticulitis with abscess formation in January 2022. Admitted 12/02/20 with abdominal pain.  S/P EXPLORATORY LAPAROTOMY; HARTMAN'S PROCEDURE; SPLEENIC FLEXURE MOBILIZATION  COLOSTOMY    PT Comments    Pt reports feeling weak and fatigued however agreeable to mobilize.  Pt only tolerated 16 feet of ambulation with RW.  Recommend SNF upon d/c at this time.   Follow Up Recommendations  SNF     Equipment Recommendations  None recommended by PT    Recommendations for Other Services       Precautions / Restrictions Precautions Precautions: Fall Precaution Comments: colostomy, right JP drain,  NG tube    Mobility  Bed Mobility Overal bed mobility: Needs Assistance Bed Mobility: Rolling;Sidelying to Sit Rolling: Min guard Sidelying to sit: Min assist;HOB elevated       General bed mobility comments: cues for log roll technique, min A for trunk support    Transfers Overall transfer level: Needs assistance Equipment used: Rolling walker (2 wheeled) Transfers: Sit to/from Stand Sit to Stand: Min assist         General transfer comment: assist to rise and steady, cues for hand placement  Ambulation/Gait Ambulation/Gait assistance: Min assist;Min guard Gait Distance (Feet): 16 Feet Assistive device: Rolling walker (2 wheeled) Gait Pattern/deviations: Step-through pattern;Decreased stride length Gait velocity: decr   General Gait Details: verbal cues for RW Positioning and posture, distance limited to pt tolerance   Stairs             Wheelchair Mobility    Modified Rankin (Stroke Patients Only)       Balance                                             Cognition Arousal/Alertness: Awake/alert Behavior During Therapy: WFL for tasks assessed/performed Overall Cognitive Status: Within Functional Limits for tasks assessed                                        Exercises      General Comments        Pertinent Vitals/Pain Pain Assessment: Faces Faces Pain Scale: Hurts a little bit Pain Location: NG tube Pain Descriptors / Indicators: Discomfort Pain Intervention(s): Repositioned;Monitored during session    Home Living                      Prior Function            PT Goals (current goals can now be found in the care plan section) Progress towards PT goals: Progressing toward goals    Frequency    Min 3X/week      PT Plan Current plan remains appropriate    Co-evaluation              AM-PAC PT "6 Clicks" Mobility   Outcome Measure  Help needed turning from your back to your side while in a flat bed without using bedrails?: A Little Help needed moving from lying on your back to sitting on the side of a flat bed without using bedrails?: A  Little Help needed moving to and from a bed to a chair (including a wheelchair)?: A Little Help needed standing up from a chair using your arms (e.g., wheelchair or bedside chair)?: A Little Help needed to walk in hospital room?: A Little Help needed climbing 3-5 steps with a railing? : Total 6 Click Score: 16    End of Session Equipment Utilized During Treatment: Gait belt Activity Tolerance: Patient tolerated treatment well;Patient limited by fatigue Patient left: in chair;with call bell/phone within reach;with nursing/sitter in room;with chair alarm set Nurse Communication: Mobility status PT Visit Diagnosis: Difficulty in walking, not elsewhere classified (R26.2);Unsteadiness on feet (R26.81)     Time: 3762-8315 PT Time Calculation (min) (ACUTE ONLY): 16 min  Charges:  $Gait Training: 8-22 mins                     Jannette Spanner PT, DPT Acute  Rehabilitation Services Pager: [phone] Office: [phone]  York Ram E 12/08/2020, 3:30 PM

## 2020-12-08 NOTE — Progress Notes (Signed)
Patients NG clamped at 0830 this AM and hooked back to suction at 1230.  At 1330 tube clamped again - 50cc of light green fluid removed during the hour. Patient with no complaints of nausea of distention. Will assess again at 1730

## 2020-12-08 NOTE — Progress Notes (Addendum)
PHARMACY - TOTAL PARENTERAL NUTRITION CONSULT NOTE   Indication: prolonged ileus  Patient Measurements: Height: 5\' 3"  (160 cm) Weight: 62.7 kg (138 lb 3.7 oz) IBW/kg (Calculated) : 52.4 TPN AdjBW (KG): 62.7 Body mass index is 24.49 kg/m. Usual Weight: ~72 kg in Dec 2021  Assessment:  22 yoF with LBO d/t chronic sigmoid diverticulitis with stricture. Status post ex lap with Hartman's colostomy on 3/26; subsequently developed postop ileus. Developing significant electrolyte abnormalities d/t continued high NG output, and Pharmacy consulted to start TPN.  Glucose / Insulin: No Hx DM; SBGs noted to be low prior to starting TPN (range 54-61 the past 3 days; goal 100-150) - no SSI ordered Electrolytes: K remains low despite repeated supplementation; Na now low as well; Mag stable WNL but requiring supplementation to maintain Renal: SCr stable WNL; BUN elevated on 3/27, now low; Bicarb now low as well (likely d/t GI losses); UOP remains robust Hepatic: LFTs WNL; albumin low (3/25); TG pending Prealbumin: pending I/O: NG output stable ~500 ml/day; drain OP remains > 200 ml/day but doesn't appear infectious; ostomy OP picking up slowly, ~200 ml/day - MIVF: D5NS with 66mEq KCl at 100 ml/hr GI Imaging: - 3/25: distal sigmoid diverticulitis w/ possible descending colon perforation; distal colonic obstruction Surgeries / Procedures:  - 3/26: Removal of descending/sigmoid colon and proximal rectum; creation of Hartman's colostomy using transverse colon  Central access: PICC ordered 3/31 TPN start date: 3/31 pending PICC placement  Nutritional Goals RD recs pending Kcal:  1400-1600kcal/day Protein:  70-80g/day Fluid:  1.4-1.6L/day  TPN at goal rate of 60 mL/hr provides 72 g of protein and 1454 kcals per day  Current Nutrition:  NPO, sips/chips  Plan:   Will receive 100 mEq KCl today from MIVF; will hold off on additional K for now  Will replete Mag with 2g IV x 1 to aid potassium  retention   Start TPN at 30 mL/hr at 1800  Electrolytes in TPN: (standard concentration)  Na - 56mEq/L  K - 70mEq/L  Ca - 87mEq/L  Mg - 30mEq/L  Phos - 64mmol/L  Cl:Ac ratio 1:1  Add standard MVI and trace elements to TPN  Initiate Sensitive q8h SSI and adjust as needed   Continue MIVF at 100 mL/hr at 1800  Monitor TPN labs on Mon/Thurs  Full panel tomorrow  Reuel Boom, PharmD, BCPS [phone] 12/08/2020, 10:40 AM

## 2020-12-08 NOTE — Progress Notes (Signed)
Peripherally Inserted Central Catheter Placement  The IV Nurse has discussed with the patient and/or persons authorized to consent for the patient, the purpose of this procedure and the potential benefits and risks involved with this procedure.  The benefits include less needle sticks, lab draws from the catheter, and the patient may be discharged home with the catheter. Risks include, but not limited to, infection, bleeding, blood clot (thrombus formation), and puncture of an artery; nerve damage and irregular heartbeat and possibility to perform a PICC exchange if needed/ordered by physician.  Alternatives to this procedure were also discussed.  Bard Power PICC patient education guide, fact sheet on infection prevention and patient information card has been provided to patient /or left at bedside.    PICC Placement Documentation  PICC Double Lumen 12/08/20 PICC Right Brachial 34 cm 0 cm (Active)  Indication for Insertion or Continuance of Line Administration of hyperosmolar/irritating solutions (i.e. TPN, Vancomycin, etc.) 12/08/20 1844  Exposed Catheter (cm) 0 cm 12/08/20 1844  Site Assessment Clean;Dry;Intact 12/08/20 1844  Lumen #1 Status Flushed;Saline locked;Blood return noted 12/08/20 1844  Lumen #2 Status Flushed;Saline locked;Blood return noted 12/08/20 1844  Dressing Type Transparent;Securing device 12/08/20 1844  Dressing Status Clean;Dry;Intact 12/08/20 1844  Antimicrobial disc in place? Yes 12/08/20 1844  Safety Lock Not Applicable 72/18/28 8337  Line Care Connections checked and tightened 12/08/20 1844  Dressing Intervention New dressing 12/08/20 1844  Dressing Change Due 12/15/20 12/08/20 Granville Dung Prien 12/08/2020, 6:45 PM

## 2020-12-08 NOTE — Progress Notes (Signed)
[facility] Progress Note  5 Days Post-Op  Subjective: CC-  Patient reports some nausea yesterday when NG was clamped, no emesis. NG tube was returned to [facility] and had bilious drainage over night (750cc/24hr). Output is thin/ light green this morning. RN just emptied air and liquid brown stool from ostomy pouch. Denies abdominal pain. She has only been using tylenol, no narcotics. WBC 10, afebrile.   Objective: Vital signs in last 24 hours: Temp:  [97.7 F (36.5 C)-98.3 F (36.8 C)] 97.7 F (36.5 C) (03/31 0543) Pulse Rate:  [82-86] 86 (03/31 0543) Resp:  [18-20] 20 (03/31 0543) BP: (135-146)/(62-74) 140/63 (03/31 0543) SpO2:  [99 %-100 %] 100 % (03/31 0543) Last BM Date: 12/07/20  Intake/Output from previous day: 03/30 0701 - 03/31 0700 In: 1606 [P.O.:120; I.V.:868.4; IV Piggyback:617.6] Out: 3225 [Urine:1850; Emesis/NG output:750; Drains:300; Stool:325] Intake/Output this shift: No intake/output data recorded.  PE: Gen: Alert, NAD, pleasant Pulm: rate and effort normal on room air Abd: Soft,mild distension, nontender, +BS,midline incision cdi with staples present and no erythema or drainage, stoma viable with air and liquid brown stool just emptied from pouch per RN, JP drain with serosanguinous fluid in bulb  Lab Results:  Recent Labs    12/07/20 0530 12/08/20 0509  WBC 7.1 10.0  HGB 8.2* 8.9*  HCT 26.4* 29.2*  PLT 167 204   BMET Recent Labs    12/07/20 0530 12/08/20 0509  NA 137 133*  K 2.8* 2.8*  CL 106 105  CO2 17* 13*  GLUCOSE 54* 61*  BUN 6* <5*  CREATININE 0.53 0.56  CALCIUM 7.8* 7.9*   PT/INR No results for input(s): LABPROT, INR in the last 72 hours. CMP     Component Value Date/Time   NA 133 (L) 12/08/2020 0509   K 2.8 (L) 12/08/2020 0509   CL 105 12/08/2020 0509   CO2 13 (L) 12/08/2020 0509   GLUCOSE 61 (L) 12/08/2020 0509   BUN <5 (L) 12/08/2020 0509   CREATININE 0.56 12/08/2020 0509   CREATININE 0.67 04/26/2020 1158    CALCIUM 7.9 (L) 12/08/2020 0509   PROT 7.1 12/02/2020 1143   ALBUMIN 3.0 (L) 12/02/2020 1143   AST 13 (L) 12/02/2020 1143   ALT 11 12/02/2020 1143   ALKPHOS 60 12/02/2020 1143   BILITOT 0.9 12/02/2020 1143   GFRNONAA >60 12/08/2020 0509   GFRAA >60 06/07/2016 0530   Lipase     Component Value Date/Time   LIPASE 32 12/02/2020 1143       Studies/Results: No results found.  Anti-infectives: Anti-infectives (From admission, onward)   Start     Dose/Rate Route Frequency Ordered Stop   12/02/20 2200  piperacillin-tazobactam (ZOSYN) IVPB 3.375 g        3.375 g 12.5 mL/hr over 240 Minutes Intravenous Every 8 hours 12/02/20 1655 12/08/20 0050   12/02/20 1600  piperacillin-tazobactam (ZOSYN) IVPB 3.375 g        3.375 g 100 mL/hr over 30 Minutes Intravenous  Once 12/02/20 1557 12/02/20 1646       Assessment/Plan HTN HLD Depression Severe protein calorie malnutrition - prealbumin 7.7 (3/26) Hx of CVA without residual deficit  Physical deconditioning ABL anemia - Hgb 8.9 from 8.2, stable Hypokalemia/ hypomagnesemia - replete K, recheck labs in AM  Diverticulitiswith distal sigmoid obstruction S/pEXPLORATORY LAPAROTOMY; HARTMAN'S PROCEDURE; SPLEENIC FLEXURE MOBILIZATION,ENDCOLOSTOMY3/26 Dr. Marcello Moores - POD#5 - surgical path: Diverticulosis with acute diverticulitis and abscess, serosal adhesions, transmural defect; Ischemic-type change, serosal adhesions, transmural defects - continue pelvic JP  drain at least 1-2 weeks - output serosanguinous - continue PT/OT, mobilize. Planning on SNF at discharge - [place] following for new ostomy education - Intermittently clamp NG tube today (4 hours clamped, 1 hour unclamped...). Return NG to LIWS if patient becomes nauseated or distended. Check prealbumin in AM, may need to consider alternative form of nutrition if unable to tolerate PO soon.  FEN: IVF 50cc/hr,intermittently clamp NG tube 4:1, ice chips VTE: SCDs, lovenox ID:  Zosyn 3/25>>3/31 Point of contact: Josiah Wojtaszek [phone] Follow up: Dr. Marcello Moores    LOS: 6 days    Wellington Hampshire, [facility] 12/08/2020, 8:25 AM Please see Amion for pager number during day hours 7:00am-4:30pm

## 2020-12-08 NOTE — Consult Note (Signed)
[place] Nurse ostomy follow up Stoma type/location: LLQ colostomy Stomal assessment/size: 1 1/2" well budded, pink patent and producing soft brown stool.  Stoma less edematous today, as are her hands. This has  impeded hands-on practice with self care.  Peristomal assessment: intact. Abdomen distended  Treatment options for stomal/peristomal skin: barrier ring and 2 piece 2 3/4" pouch Output soft brown stool Ostomy pouching: 2pc. 2 3/4" pouch with barrier ring.   Education provided: POuch is removed and skin is cleansed.  Barrier ring applied.  Patient does not have the appropriate glasses to see to measure stoma and cut but observes and engages in learning.  Her daughter, who lives in [place], has an ileostomy and they have been discussing life with an ostomy. She will be a source of education and comfort regarding her new ostomy. After cutting, patient is able to snap together pouch and barrier slowly.  She is able to peel the transparent backing off of the barrier.  We apply the system together.  After smoothing and sealing pouch, patient is able to roll pouch closed.  She can open and simulate emptying.  I have encouraged her to participate in emptying when it is due again. She agrees.  At this time, I think she could manage emptying several times daily and will not need closed end pouches.  We discuss frequency of pouch changes, showering and use of a filtered pouch at home as she has a lot of intestinal gas in pouch today.  Enrolled patient in [place] Start Discharge program: Yes previously Will follow and my partner will plan to see her Monday if still in house.   Domenic Moras MSN, RN, FNP-BC CWON Wound, Ostomy, Continence Nurse Pager [phone]

## 2020-12-09 LAB — DIFFERENTIAL
Abs Immature Granulocytes: 0.13 10*3/uL — ABNORMAL HIGH (ref 0.00–0.07)
Basophils Absolute: 0 10*3/uL (ref 0.0–0.1)
Basophils Relative: 0 %
Eosinophils Absolute: 0.3 10*3/uL (ref 0.0–0.5)
Eosinophils Relative: 4 %
Immature Granulocytes: 2 %
Lymphocytes Relative: 10 %
Lymphs Abs: 0.7 10*3/uL (ref 0.7–4.0)
Monocytes Absolute: 0.7 10*3/uL (ref 0.1–1.0)
Monocytes Relative: 10 %
Neutro Abs: 5.1 10*3/uL (ref 1.7–7.7)
Neutrophils Relative %: 74 %

## 2020-12-09 LAB — COMPREHENSIVE METABOLIC PANEL
ALT: 8 U/L (ref 0–44)
AST: 11 U/L — ABNORMAL LOW (ref 15–41)
Albumin: 2.2 g/dL — ABNORMAL LOW (ref 3.5–5.0)
Alkaline Phosphatase: 43 U/L (ref 38–126)
Anion gap: 5 (ref 5–15)
BUN: <5 mg/dL — ABNORMAL LOW (ref 8–23)
CO2: 20 mmol/L — ABNORMAL LOW (ref 22–32)
Calcium: 7.9 mg/dL — ABNORMAL LOW (ref 8.9–10.3)
Chloride: 113 mmol/L — ABNORMAL HIGH (ref 98–111)
Creatinine, Ser: 0.36 mg/dL — ABNORMAL LOW (ref 0.44–1.00)
GFR, Estimated: >60 mL/min (ref >60)
Glucose, Bld: 202 mg/dL — ABNORMAL HIGH (ref 70–99)
Potassium: 3.6 mmol/L (ref 3.5–5.1)
Sodium: 138 mmol/L (ref 135–145)
Total Bilirubin: 0.6 mg/dL (ref 0.3–1.2)
Total Protein: 5.2 g/dL — ABNORMAL LOW (ref 6.5–8.1)

## 2020-12-09 LAB — MAGNESIUM: Magnesium: 2 mg/dL (ref 1.7–2.4)

## 2020-12-09 LAB — PHOSPHORUS: Phosphorus: 1.1 mg/dL — ABNORMAL LOW (ref 2.5–4.6)

## 2020-12-09 LAB — CBC
HCT: 25.5 % — ABNORMAL LOW (ref 36.0–46.0)
Hemoglobin: 8.1 g/dL — ABNORMAL LOW (ref 12.0–15.0)
MCH: 28 pg (ref 26.0–34.0)
MCHC: 31.8 g/dL (ref 30.0–36.0)
MCV: 88.2 fL (ref 80.0–100.0)
Platelets: 172 10*3/uL (ref 150–400)
RBC: 2.89 MIL/uL — ABNORMAL LOW (ref 3.87–5.11)
RDW: 15.4 % (ref 11.5–15.5)
WBC: 6.9 10*3/uL (ref 4.0–10.5)
nRBC: 0 % (ref 0.0–0.2)

## 2020-12-09 LAB — GLUCOSE, CAPILLARY
Glucose-Capillary: 174 mg/dL — ABNORMAL HIGH (ref 70–99)
Glucose-Capillary: 191 mg/dL — ABNORMAL HIGH (ref 70–99)
Glucose-Capillary: 146 mg/dL — ABNORMAL HIGH (ref 70–99)

## 2020-12-09 LAB — PREALBUMIN
Prealbumin: 6.6 mg/dL — ABNORMAL LOW (ref 18–38)
Prealbumin: 6.7 mg/dL — ABNORMAL LOW (ref 18–38)

## 2020-12-09 LAB — TRIGLYCERIDES: Triglycerides: 148 mg/dL (ref <150)

## 2020-12-09 MED ORDER — BOOST / RESOURCE BREEZE PO LIQD CUSTOM
1.0000 | Freq: Three times a day (TID) | ORAL | Status: DC
  Administered 2020-12-09 – 2020-12-11 (×8): 1 via ORAL

## 2020-12-09 MED ORDER — TRAVASOL 10 % IV SOLN
INTRAVENOUS | Status: AC
  Filled 2020-12-09: qty 480

## 2020-12-09 MED ORDER — POTASSIUM PHOSPHATES 15 MMOLE/5ML IV SOLN
30.0000 mmol | Freq: Once | INTRAVENOUS | Status: AC
  Administered 2020-12-09: 30 mmol via INTRAVENOUS
  Filled 2020-12-09: qty 10

## 2020-12-09 NOTE — Progress Notes (Signed)
Nutrition Follow-up  DOCUMENTATION CODES:   Not applicable  INTERVENTION:  - continue TPN advancement per Pharmacist. - continued diet advancement as medically feasible.   NUTRITION DIAGNOSIS:   Inadequate oral intake related to acute illness as evidenced by other (comment) (current diet order). -ongoing  GOAL:   Patient will meet greater than or equal to 90% of their needs -to be met with TPN and PO intakes  MONITOR:   PO intake,Diet advancement,Labs,Weight trends,I & O's,Other (Comment) (TPN regimen)  REASON FOR ASSESSMENT:   Malnutrition Screening Tool    ASSESSMENT:   87 y/o female with h/o depression and stroke who is admitted with diverticular abscess now s/p Hartmann's and end colostomy 3/26   NGT was removed this AM and diet advanced from NPO to CLD at 0927. Patient had consumed 4 oz of cranberry juice and planned to order broth shortly after RD visit. She denied abdominal pain/pressure or nausea, but did report abdominal distention.   Double lumen PICC was placed in R brachial yesterday and custom TPN initiated at 30 ml/hr yesterday evening. Plan to increase to 40 ml/hr this evening. Goal rate for TPN is 60 ml/hr and will provide 1454 kcal and 72 grams protein.    Labs reviewed; CBGs: 174 and 191 mg/dl, Cl: 113 mmol/l, BUN: <5 mg/dl, creatinine: 0.36 mg/dl, Ca: 7.9 mg/dl, Phos: 1.1 mg/dl. Medications reviewed; sliding scale novolog, 2 g IV Mg sulfate x1 run 3/31, 40 mg IV protonix/day, 10 mEq IV KCl x6 runs 3/31, 30 mmol IV KPhos x1 run 4/1. IVF; D5-NS @ 100 ml/hr (408 kcal/24 hours).      NUTRITION - FOCUSED PHYSICAL EXAM:  Flowsheet Row Most Recent Value  Orbital Region No depletion  Upper Arm Region No depletion  Thoracic and Lumbar Region Unable to assess  Buccal Region No depletion  Temple Region No depletion  Clavicle Bone Region No depletion  Clavicle and Acromion Bone Region No depletion  Scapular Bone Region Unable to assess  Dorsal Hand No  depletion  Patellar Region Unable to assess  Anterior Thigh Region Unable to assess  Posterior Calf Region Unable to assess  Edema (RD Assessment) Unable to assess  Hair Reviewed  Eyes Reviewed  Mouth Reviewed  Skin Reviewed  Nails Reviewed       Diet Order:   Diet Order            Diet clear liquid Room service appropriate? Yes; Fluid consistency: Thin  Diet effective now                 EDUCATION NEEDS:   Education needs have been addressed  Skin:  Skin Assessment: Skin Integrity Issues: Skin Integrity Issues:: Incisions Incisions: abdomen (3/26)  Last BM:  3/31 (100 ml via colostomy)  Height:   Ht Readings from Last 1 Encounters:  12/02/20 5' 3"  (1.6 m)    Weight:   Wt Readings from Last 1 Encounters:  12/08/20 66.3 kg     Estimated Nutritional Needs:  Kcal:  1400-1600kcal/day Protein:  70-80g/day Fluid:  1.4-1.6L/day     Jarome Matin, MS, RD, LDN, CNSC Inpatient Clinical Dietitian RD pager # available in [place]  After hours/weekend pager # available in [facility]

## 2020-12-09 NOTE — Progress Notes (Signed)
PHARMACY - TOTAL PARENTERAL NUTRITION CONSULT NOTE   Indication: prolonged ileus  Patient Measurements: Height: 5\' 3"  (160 cm) Weight: 66.3 kg (146 lb 2.6 oz) IBW/kg (Calculated) : 52.4 TPN AdjBW (KG): 62.7 Body mass index is 25.89 kg/m. Usual Weight: ~72 kg in Dec 2021  Assessment:  44 yoF with LBO d/t chronic sigmoid diverticulitis with stricture. Status post ex lap with Hartman's colostomy on 3/26; subsequently developed postop ileus. Developing significant electrolyte abnormalities d/t continued high NG output, and Pharmacy consulted to start TPN.  Glucose / Insulin: No Hx DM; BGs noted to be low prior to starting TPN (range 54-61 the past 3 days; goal 100-150) - CBGs now 145-174 Electrolytes: K now at low-end of goal (3.6) with repeated supplementation; Mag stable WNL but requiring supplementation to maintain. Phos low (1.1); corrected Ca WNL (9.34) Renal: SCr stable WNL; BUN low; Bicarb now low/Cl elevated (likely d/t GI losses); UOP 743ml over past 24hr Hepatic: LFTs WNL; albumin low (3/25); TG 148 Prealbumin: 6.7 I/O: Clamping NGT alternating with LIS. Drain OP appears decreased - MIVF: D5NS with 12mEq KCl at 100 ml/hr GI Imaging: - 3/25: distal sigmoid diverticulitis w/ possible descending colon perforation; distal colonic obstruction Surgeries / Procedures:  - 3/26: Removal of descending/sigmoid colon and proximal rectum; creation of Hartman's colostomy using transverse colon  Central access: PICC ordered 3/31 TPN start date: 3/31 pending PICC placement  Nutritional Goals RD recs pending Kcal:  1400-1600kcal/day Protein:  70-80g/day Fluid:  1.4-1.6L/day  TPN at goal rate of 60 mL/hr provides 72 g of protein and 1454 kcals per day  Current Nutrition:  NPO, sips/chips - advance to clears 4/1  Plan:   Potassium phosphate 32mmol IV x 1 (81meq K+ so additional K+ bolus needed)  Also receiving 100 mEq KCl today from MIVF; will hold off on additional K for  now   Increase TPN to 40 mL/hr at 1800  Electrolytes in TPN: (standard concentration)  Na - 42mEq/L  K - 67mEq/L  Ca - 62mEq/L  Mg - 79mEq/L  Phos - increase 55mmol/L  Cl:Ac ratio 1:2  Add standard MVI and trace elements to TPN  Initiate Sensitive q8h SSI and adjust as needed   Continue MIVF at 100 mL/hr at 1800 per CCS  Monitor TPN labs on Mon/Thurs  Full panel tomorrow  Peggyann Juba, PharmD, [place]: [phone] 12/09/2020, 7:09 AM

## 2020-12-09 NOTE — Progress Notes (Signed)
[facility] Progress Note  6 Days Post-Op  Subjective: CC-  No nausea at all. Minimal NG output.    Objective: Vital signs in last 24 hours: Temp:  [97.4 F (36.3 C)-97.9 F (36.6 C)] 97.7 F (36.5 C) (04/01 0541) Pulse Rate:  [85-87] 86 (04/01 0541) Resp:  [18-20] 18 (04/01 0541) BP: (129-141)/(67-68) 141/67 (04/01 0541) SpO2:  [100 %] 100 % (04/01 0541) Weight:  [66.3 kg] 66.3 kg (03/31 1500) Last BM Date: 12/08/20 (liquid)  Intake/Output from previous day: 03/31 0701 - 04/01 0700 In: 3725.3 [P.O.:60; I.V.:2813.4; IV Piggyback:851.9] Out: 1225 [Urine:750; Emesis/NG output:150; Drains:175; Stool:150] Intake/Output this shift: No intake/output data recorded.  PE: Gen: Alert, NAD, pleasant Pulm: rate and effort normal on room air Abd: Soft,mild distension, appropriately tender, midline incision cdi with staples present and no erythema or drainage, stoma viable with air and liquid brown stool, JP drain with serosanguinous fluid in bulb  Lab Results:  Recent Labs    12/08/20 0509 12/09/20 0446  WBC 10.0 6.9  HGB 8.9* 8.1*  HCT 29.2* 25.5*  PLT 204 172   BMET Recent Labs    12/08/20 0509 12/09/20 0446  NA 133* 138  K 2.8* 3.6  CL 105 113*  CO2 13* 20*  GLUCOSE 61* 202*  BUN <5* <5*  CREATININE 0.56 0.36*  CALCIUM 7.9* 7.9*   PT/INR No results for input(s): LABPROT, INR in the last 72 hours. CMP     Component Value Date/Time   NA 138 12/09/2020 0446   K 3.6 12/09/2020 0446   CL 113 (H) 12/09/2020 0446   CO2 20 (L) 12/09/2020 0446   GLUCOSE 202 (H) 12/09/2020 0446   BUN <5 (L) 12/09/2020 0446   CREATININE 0.36 (L) 12/09/2020 0446   CREATININE 0.67 04/26/2020 1158   CALCIUM 7.9 (L) 12/09/2020 0446   PROT 5.2 (L) 12/09/2020 0446   ALBUMIN 2.2 (L) 12/09/2020 0446   AST 11 (L) 12/09/2020 0446   ALT 8 12/09/2020 0446   ALKPHOS 43 12/09/2020 0446   BILITOT 0.6 12/09/2020 0446   GFRNONAA >60 12/09/2020 0446   GFRAA >60 06/07/2016 0530    Lipase     Component Value Date/Time   LIPASE 32 12/02/2020 1143       Studies/Results: [place] EKG SITE RITE  Result Date: 12/08/2020 If Site Rite image not attached, placement could not be confirmed due to current cardiac rhythm.   Anti-infectives: Anti-infectives (From admission, onward)   Start     Dose/Rate Route Frequency Ordered Stop   12/02/20 2200  piperacillin-tazobactam (ZOSYN) IVPB 3.375 g        3.375 g 12.5 mL/hr over 240 Minutes Intravenous Every 8 hours 12/02/20 1655 12/08/20 0050   12/02/20 1600  piperacillin-tazobactam (ZOSYN) IVPB 3.375 g        3.375 g 100 mL/hr over 30 Minutes Intravenous  Once 12/02/20 1557 12/02/20 1646       Assessment/Plan HTN HLD Depression Severe protein calorie malnutrition - prealbumin 7.7 (3/26) Hx of CVA without residual deficit  Physical deconditioning ABL anemia - Hgb stable Hypokalemia/ hypomagnesemia - improved  Diverticulitiswith distal sigmoid obstruction S/pEXPLORATORY LAPAROTOMY; HARTMAN'S PROCEDURE; SPLENIC FLEXURE MOBILIZATION,ENDCOLOSTOMY3/26 Dr. Marcello Moores - POD#6 - surgical path: Diverticulosis with acute diverticulitis and abscess, serosal adhesions, transmural defect; Ischemic-type change, serosal adhesions, transmural defects - continue pelvic JP drain at least 1-2 weeks - output serosanguinous - continue PT/OT, mobilize. Planning on SNF at discharge - [facility] following for new ostomy education -Remove NG today, clear liquids. Continue  TPN until taking good PO.  FEN: TPN VTE: SCDs, lovenox ID: [email or web address] 3/25>>3/31 Point of contact: Lonia Roane [phone] Follow up: Dr. Marcello Moores    LOS: 7 days    Clovis Riley, MD [facility] 12/09/2020, 9:17 AM Please see Amion for pager number during day hours 7:00am-4:30pm

## 2020-12-09 NOTE — Progress Notes (Signed)
Occupational Therapy Treatment Patient Details Name: Sara Logan MRN: [id number] DOB: 12/19/1932 Today's Date: 12/09/2020    History of present illness Patient is an 87 year old female with a past medical history significant for HLD, thrombocytopenia, depression, arthritis, stroke, diverticulitis with abscess formation in January 2022. Admitted 12/02/20 with abdominal pain.  S/P EXPLORATORY LAPAROTOMY; HARTMAN'S PROCEDURE; SPLEENIC FLEXURE MOBILIZATION  COLOSTOMY   OT comments  Patient up in chair and wanting to get back to bed. Patient min guard for standing with RW and to take steps to [facility]. Patient need verbal cues for walker management and hand placement throughout treatment. Patient somewhat anxious in regards to mobility wanting step by step instructions. Patient able to assist with clothing management today with one hand but still needed full assistance with hygiene. Patient min assist to return to supine for LEs. Patient overall continues to exhibit generalized weakness and decreased activity tolerance. Continue to recommend short term rehab at discharge.   Follow Up Recommendations  SNF    Equipment Recommendations  None recommended by OT    Recommendations for Other Services      Precautions / Restrictions Precautions Precautions: Fall Precaution Comments: colostomy, right JP drain Restrictions Weight Bearing Restrictions: No       Mobility Bed Mobility   Bed Mobility: Sit to Supine       Sit to supine: Min assist   General bed mobility comments: min assist for LEs to return to supine - reports of mild pain.    Transfers Overall transfer level: Needs assistance Equipment used: Rolling walker (2 wheeled) Transfers: Sit to/from Stand Sit to Stand: Min guard Stand pivot transfers: Min guard       General transfer comment: min guard for ambulation - verbal cues for walker management.    Balance Overall balance assessment: Needs assistance Sitting-balance  support: No upper extremity supported Sitting balance-Leahy Scale: Good     Standing balance support: During functional activity;Single extremity supported Standing balance-Leahy Scale: Poor Standing balance comment: Reliant on atleast one arm.                           ADL either performed or assessed with clinical judgement   ADL Overall ADL's : Needs assistance/impaired                         Toilet Transfer: Min Forensic psychologist Details (indicate cue type and reason): min guard to stand from [facility] - with verbal cues for hand placement. Patient slow to rise but did not need physical assistance. Toileting- Clothing Manipulation and Hygiene: Moderate assistance Toileting - Clothing Manipulation Details (indicate cue type and reason): Patient able to assist with clothing management today with one hand on walker at all times but needed assistance for walking.     Functional mobility during ADLs: Min guard;Rolling walker;Cueing for sequencing       Vision Baseline Vision/History: No visual deficits     Perception     Praxis      Cognition Arousal/Alertness: Awake/alert Behavior During Therapy: WFL for tasks assessed/performed Overall Cognitive Status: Within Functional Limits for tasks assessed                                          Exercises     Shoulder Instructions       General Comments  Pertinent Vitals/ Pain       Pain Assessment: 0-10 Pain Score: 3  Pain Location: JP site Pain Descriptors / Indicators: Sore Pain Intervention(s): Limited activity within patient's tolerance  Home Living                                          Prior Functioning/Environment              Frequency  Min 2X/week        Progress Toward Goals  OT Goals(current goals can now be found in the care plan section)  Progress towards OT goals: Progressing toward goals  Acute Rehab OT Goals Patient  Stated Goal: get stronger OT Goal Formulation: With patient Time For Goal Achievement: 12/20/20 Potential to Achieve Goals: Good  Plan Discharge plan remains appropriate    Co-evaluation                 AM-PAC OT "6 Clicks" Daily Activity     Outcome Measure   Help from another person eating meals?: None Help from another person taking care of personal grooming?: A Little Help from another person toileting, which includes using toliet, bedpan, or urinal?: A Lot Help from another person bathing (including washing, rinsing, drying)?: A Lot Help from another person to put on and taking off regular upper body clothing?: A Little Help from another person to put on and taking off regular lower body clothing?: A Lot 6 Click Score: 16    End of Session Equipment Utilized During Treatment: Rolling walker  OT Visit Diagnosis: Other abnormalities of gait and mobility (R26.89);Muscle weakness (generalized) (M62.81);Pain   Activity Tolerance Patient tolerated treatment well   Patient Left in bed;with call bell/phone within reach;with bed alarm set   Nurse Communication Mobility status        Time: 6063-0160 OT Time Calculation (min): 13 min  Charges: OT General Charges $OT Visit: 1 Visit OT Treatments $Self Care/Home Management : 8-22 mins  Derl Barrow, OTR/L [place]  Office [phone] Pager: [place] 12/09/2020, 12:57 PM

## 2020-12-09 NOTE — NC FL2 (Signed)
[place] LEVEL OF CARE SCREENING TOOL     IDENTIFICATION  Patient Name: Sara Logan Birthdate: 20-Jun-1933 Sex: female Admission Date (Current Location): 12/02/2020  [facility] and [place] Number:  Herbalist and Address:  [facility],  [place] [address] [place]      Provider Number: [id number]  Attending Physician Name and Address:  Edison Pace Md, MD  Relative Name and Phone Number:  Storm Frisk [phone]    Current Level of Care: Hospital Recommended Level of Care: [place] Prior Approval Number:    Date Approved/Denied:   PASRR Number: [id number]  Discharge Plan: SNF    Current Diagnoses: Patient Active Problem List   Diagnosis Date Noted  . Abscess of sigmoid colon due to diverticulitis 10/11/2020  . Diverticulitis 09/28/2020  . Atrophic vaginitis 03/24/2018  . Urine incontinence 03/24/2018  . Hypertensive retinopathy of both eyes 10/14/2017  . Primary osteoarthritis of left knee 06/04/2017  . Near syncope 06/06/2016  . Nausea and vomiting 06/06/2016  . Dyspnea 06/06/2016  . Essential hypertension 10/27/2015  . 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 History of normocytic normochromic anemia 03/18/2015  . Obesity (BMI 30-39.9) 08/07/2013  . Urinary urgency 03/19/2013  . Dermatochalasis 02/20/2013  . PCO (posterior capsular opacification) 02/20/2013  . Osteopenia 08/27/2012  . Pseudophakia 07/18/2012  . Venous stasis 02/06/2012  . 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 Rosacea 07/05/2011  . Vitamin D deficiency 12/08/2010  . Hyperlipidemia LDL goal <70 09/08/2010  . Depression, recurrent ([place]) 09/08/2010    Orientation RESPIRATION BLADDER Height & Weight     Self,Time,Situation  Normal Incontinent Weight: 66.3 kg Height:  5\' 3"  (160 cm)  BEHAVIORAL SYMPTOMS/MOOD NEUROLOGICAL BOWEL NUTRITION STATUS      Continent Diet (currently TPN weaning;CHO MOD)  AMBULATORY STATUS  COMMUNICATION OF NEEDS Skin   Limited Assist Verbally Surgical wounds,Other (Comment) (R abd drain-currently serosanguinous 25cc.)                       Personal Care Assistance Level of Assistance  Bathing,Feeding,Dressing Bathing Assistance: Limited assistance Feeding assistance: Limited assistance Dressing Assistance: Limited assistance     Functional Limitations Info  Sight,Hearing,Speech Sight Info: Impaired (eyeglasses) Hearing Info: Adequate Speech Info: Impaired (Partials top)    SPECIAL CARE FACTORS FREQUENCY  PT (By licensed PT),OT (By licensed OT)     PT Frequency:  (5x week) OT Frequency:  (5x week)            Contractures Contractures Info: Not present    Additional Factors Info  Code Status,Allergies Code Status Info:  (Full) Allergies Info:  (Bactrim (Sulfamethoxazole-trimethoprim), Ditropan (Oxybutynin), Sulfa Antibiotics)           Current Medications (12/09/2020):  This is the current hospital active medication list Current Facility-Administered Medications  Medication Dose Route Frequency Provider Last Rate Last Admin  . acetaminophen (TYLENOL) tablet 650 mg  650 mg Oral Q6H PRN Meuth, Brooke A, PA-C      . chlorhexidine (PERIDEX) 0.12 % solution 15 mL  15 mL Mouth Rinse BID Leighton Ruff, MD   15 mL at 12/09/20 0945  . Chlorhexidine Gluconate Cloth 2 % PADS 6 each  6 each Topical Daily Meuth, Brooke A, PA-C   6 each at 12/09/20 0946  . dextrose 5 % and 0.9 % NaCl with KCl 40 mEq/L infusion   Intravenous Continuous Clovis Riley, MD 100 mL/hr at 12/09/20  [place] at 12/09/20 0606  . diphenhydrAMINE (BENADRYL) 12.5 MG/5ML elixir 12.5 mg  12.5 mg Oral E3X PRN Leighton Ruff, MD       Or  . diphenhydrAMINE (BENADRYL) injection 12.5 mg  12.5 mg Intravenous V4M PRN Leighton Ruff, MD   08.6 mg at 12/03/20 2356  . enoxaparin (LOVENOX) injection 40 mg  40 mg Subcutaneous P61P Leighton Ruff, MD   40 mg at 12/08/20 1639  . feeding supplement  (BOOST / RESOURCE BREEZE) liquid 1 Container  1 Container Oral TID BM Clovis Riley, MD   1 Container at 12/09/20 1359  . hydrALAZINE (APRESOLINE) injection 10 mg  10 mg Intravenous J0D PRN Leighton Ruff, MD      . HYDROmorphone (DILAUDID) injection 0.5-1 mg  0.5-1 mg Intravenous Q4H PRN Romana Juniper A, MD      . insulin aspart (novoLOG) injection 0-9 Units  0-9 Units Subcutaneous Q8H Polly Cobia, RPH   2 Units at 12/09/20 1358  . MEDLINE mouth rinse  15 mL Mouth Rinse [id number] Leighton Ruff, MD   15 mL at 12/09/20 1251  . methocarbamol (ROBAXIN) 500 mg in dextrose 5 % 50 mL IVPB  500 mg Intravenous Q6H PRN Meuth, Brooke A, PA-C      . metoprolol tartrate (LOPRESSOR) injection 5 mg  5 mg Intravenous W5Y PRN Leighton Ruff, MD      . ondansetron (ZOFRAN-ODT) disintegrating tablet 4 mg  4 mg Oral K9X PRN Leighton Ruff, MD       Or  . ondansetron [facility]) injection 4 mg  4 mg Intravenous I3J PRN Leighton Ruff, MD   4 mg at 82/50/53 0551  . pantoprazole (PROTONIX) injection 40 mg  40 mg Intravenous QHS Leighton Ruff, MD   40 mg at 12/08/20 2153  . sodium chloride flush (NS) 0.9 % injection 10-40 mL  10-40 mL Intracatheter Q12H Meuth, Brooke A, PA-C   10 mL at 12/08/20 2208  . sodium chloride flush (NS) 0.9 % injection 10-40 mL  10-40 mL Intracatheter PRN Meuth, Brooke A, PA-C   10 mL at 12/08/20 2154  . TPN ADULT (ION)   Intravenous Continuous TPN Polly Cobia, RPH 30 mL/hr at 12/09/20 0406 Infusion Verify at 12/09/20 0406  . TPN ADULT (ION)   Intravenous Continuous TPN Emiliano Dyer, [facility]         Discharge Medications: Please see discharge summary for a list of discharge medications.  Relevant Imaging Results:  Relevant Lab Results:   Additional Information Stoma type/location: LLQ colostomy. Dr. Marcello Moores in during my visit today for stoma and midline incision assessment.  Stomal assessment/size: 2 inches, red, raised, moist, edematous, lumen in center.  Peristomal  assessment: Not seen today  Treatment options for stomal/peristomal skin: skin barrier ring  Output: soft brown stool in pouch with air  Ostomy pouching: 2pc. 2 and 3/4 inch with skin barrier ring  Education provided: Patient is not able to visualize pouching system while in bed, her hands are still too edematous to perform/practice with Lock and Roll closure today. Today we discuss diet and change frequency.   Enrolled patient in [place] Start Discharge program: Yes.  May need to call and request closed end pouching samples.     My partners will see in my absence. I will return Monday ad see if patient still in house.     Supplies for discharge as well (as her [employer]) are in the cupboard above the skin in her room.  Pouches, skin barriers and skin barrier rings.   [place] nursing team will follow, and will remain available to this patient, the nursing and medical teams.    Thanks,  Maudie Flakes, MSN, RN, [place], Arther Abbott   Pager# [phone])  Marilynn Ekstein, Juliann Pulse, RN

## 2020-12-09 NOTE — Progress Notes (Signed)
Physical Therapy Treatment Patient Details Name: Sara Logan MRN: [id number] DOB: Jul 08, 1933 Today's Date: 12/09/2020    History of Present Illness Patient is an 87 year old female with a past medical history significant for HLD, thrombocytopenia, depression, arthritis, stroke, diverticulitis with abscess formation in January 2022. Admitted 12/02/20 with abdominal pain.  S/P EXPLORATORY LAPAROTOMY; HARTMAN'S PROCEDURE; SPLEENIC FLEXURE MOBILIZATION  COLOSTOMY    PT Comments    Pt assisted to [facility] and able to perform pericare with provided washcloths.   Pt then ambulated 20 feet in hallway however fatigued quickly.  Pt pleasant and motivated to continue improving mobility.   Follow Up Recommendations  SNF     Equipment Recommendations  None recommended by PT    Recommendations for Other Services       Precautions / Restrictions Precautions Precautions: Fall Precaution Comments: colostomy, right JP drain Restrictions Weight Bearing Restrictions: No    Mobility  Bed Mobility Overal bed mobility: Needs Assistance Bed Mobility: Sit to Supine Rolling: Min guard Sidelying to sit: Min assist;HOB elevated   Sit to supine: Min assist   General bed mobility comments: assist for trunk upright    Transfers Overall transfer level: Needs assistance Equipment used: Rolling walker (2 wheeled) Transfers: Sit to/from Stand Sit to Stand: Min guard;Min assist Stand pivot transfers: Min guard       General transfer comment: verbal cues for hand placement and self assist; pt requested transfer to [facility] prior to ambulating  Ambulation/Gait Ambulation/Gait assistance: Min guard Gait Distance (Feet): 20 Feet Assistive device: Rolling walker (2 wheeled) Gait Pattern/deviations: Step-through pattern;Decreased stride length Gait velocity: decr   General Gait Details: verbal cues for RW Positioning and posture, distance limited to pt tolerance   Stairs             Wheelchair  Mobility    Modified Rankin (Stroke Patients Only)       Balance Overall balance assessment: Needs assistance Sitting-balance support: No upper extremity supported Sitting balance-Leahy Scale: Good     Standing balance support: During functional activity;Single extremity supported Standing balance-Leahy Scale: Poor Standing balance comment: Reliant on atleast one arm.                            Cognition Arousal/Alertness: Awake/alert Behavior During Therapy: WFL for tasks assessed/performed Overall Cognitive Status: Within Functional Limits for tasks assessed                                        Exercises      General Comments        Pertinent Vitals/Pain Pain Assessment: 0-10 Pain Score: 3  Pain Location: JP site Pain Descriptors / Indicators: Sore Pain Intervention(s): Monitored during session;Repositioned    Home Living                      Prior Function            PT Goals (current goals can now be found in the care plan section) Acute Rehab PT Goals Patient Stated Goal: get stronger Progress towards PT goals: Progressing toward goals    Frequency    Min 3X/week      PT Plan Current plan remains appropriate    Co-evaluation              AM-PAC PT "6 Clicks" Mobility  Outcome Measure  Help needed turning from your back to your side while in a flat bed without using bedrails?: A Little Help needed moving from lying on your back to sitting on the side of a flat bed without using bedrails?: A Little Help needed moving to and from a bed to a chair (including a wheelchair)?: A Little Help needed standing up from a chair using your arms (e.g., wheelchair or bedside chair)?: A Little Help needed to walk in hospital room?: A Little Help needed climbing 3-5 steps with a railing? : A Lot 6 Click Score: 17    End of Session Equipment Utilized During Treatment: Gait belt Activity Tolerance: Patient  tolerated treatment well;Patient limited by fatigue Patient left: in chair;with call bell/phone within reach;with nursing/sitter in room;with chair alarm set Nurse Communication: Mobility status PT Visit Diagnosis: Difficulty in walking, not elsewhere classified (R26.2);Unsteadiness on feet (R26.81)     Time: 1013-1040 PT Time Calculation (min) (ACUTE ONLY): 27 min  Charges:  $Gait Training: 8-22 mins $Therapeutic Activity: 8-22 mins                     Jannette Spanner PT, DPT Acute Rehabilitation Services Pager: [phone] Office: [phone]  [place] E 12/09/2020, 3:12 PM

## 2020-12-09 NOTE — Progress Notes (Signed)
Patient now on clear liq diet and tolerating it well,no  N/V.

## 2020-12-09 NOTE — NC FL2 (Signed)
[place] LEVEL OF CARE SCREENING TOOL     IDENTIFICATION  Patient Name: Sara Logan Birthdate: [birth year] Sex: female Admission Date (Current Location): 12/02/2020  [facility] and [place] Number:  Herbalist and Address:  [facility],  [place] [address] [place]      Provider Number: [id number]  Attending Physician Name and Address:  Edison Pace Md, MD  Relative Name and Phone Number:  Storm Frisk [phone]    Current Level of Care: Hospital Recommended Level of Care: [place] Prior Approval Number:    Date Approved/Denied:   PASRR Number:    Discharge Plan: SNF    Current Diagnoses: Patient Active Problem List   Diagnosis Date Noted  . Abscess of sigmoid colon due to diverticulitis 10/11/2020  . Diverticulitis 09/28/2020  . Atrophic vaginitis 03/24/2018  . Urine incontinence 03/24/2018  . Hypertensive retinopathy of both eyes 10/14/2017  . Primary osteoarthritis of left knee 06/04/2017  . Near syncope 06/06/2016  . Nausea and vomiting 06/06/2016  . Dyspnea 06/06/2016  . Essential hypertension 10/27/2015  . 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 History of normocytic normochromic anemia 03/18/2015  . Obesity (BMI 30-39.9) 08/07/2013  . Urinary urgency 03/19/2013  . Dermatochalasis 02/20/2013  . PCO (posterior capsular opacification) 02/20/2013  . Osteopenia 08/27/2012  . Pseudophakia 07/18/2012  . Venous stasis 02/06/2012  . 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 Rosacea 07/05/2011  . Vitamin D deficiency 12/08/2010  . Hyperlipidemia LDL goal <70 09/08/2010  . Depression, recurrent ([place]) 09/08/2010    Orientation RESPIRATION BLADDER Height & Weight     Self,Time,Situation  Normal Incontinent Weight: 66.3 kg Height:  5\' 3"  (160 cm)  BEHAVIORAL SYMPTOMS/MOOD NEUROLOGICAL BOWEL NUTRITION STATUS      Continent Diet (currently TPN weaning;CHO MOD)  AMBULATORY STATUS COMMUNICATION OF  NEEDS Skin   Limited Assist Verbally Surgical wounds,Other (Comment) (R abd drain-currently serosanguinous 25cc.)                       Personal Care Assistance Level of Assistance  Bathing,Feeding,Dressing Bathing Assistance: Limited assistance Feeding assistance: Limited assistance Dressing Assistance: Limited assistance     Functional Limitations Info  Sight,Hearing,Speech Sight Info: Impaired (eyeglasses) Hearing Info: Adequate Speech Info: Impaired (Partials top)    SPECIAL CARE FACTORS FREQUENCY  PT (By licensed PT),OT (By licensed OT)     PT Frequency:  (5x week) OT Frequency:  (5x week)            Contractures Contractures Info: Not present    Additional Factors Info  Code Status,Allergies Code Status Info:  (Full) Allergies Info:  (Bactrim (Sulfamethoxazole-trimethoprim), Ditropan (Oxybutynin), Sulfa Antibiotics)           Current Medications (12/09/2020):  This is the current hospital active medication list Current Facility-Administered Medications  Medication Dose Route Frequency Provider Last Rate Last Admin  . acetaminophen (TYLENOL) tablet 650 mg  650 mg Oral Q6H PRN Meuth, Brooke A, PA-C      . chlorhexidine (PERIDEX) 0.12 % solution 15 mL  15 mL Mouth Rinse BID Leighton Ruff, MD   15 mL at 12/09/20 0945  . Chlorhexidine Gluconate Cloth 2 % PADS 6 each  6 each Topical Daily Meuth, Brooke A, PA-C   6 each at 12/09/20 0946  . dextrose 5 % and 0.9 % NaCl with KCl 40 mEq/L infusion   Intravenous Continuous Clovis Riley, MD 100 mL/hr at  12/09/20 0606 New Bag at 12/09/20 0606  . diphenhydrAMINE (BENADRYL) 12.5 MG/5ML elixir 12.5 mg  12.5 mg Oral C1K PRN Leighton Ruff, MD       Or  . diphenhydrAMINE (BENADRYL) injection 12.5 mg  12.5 mg Intravenous G8J PRN Leighton Ruff, MD   85.6 mg at 12/03/20 2356  . enoxaparin (LOVENOX) injection 40 mg  40 mg Subcutaneous D14H Leighton Ruff, MD   40 mg at 12/08/20 1639  . feeding supplement (BOOST / RESOURCE  BREEZE) liquid 1 Container  1 Container Oral TID BM Clovis Riley, MD   1 Container at 12/09/20 1359  . hydrALAZINE (APRESOLINE) injection 10 mg  10 mg Intravenous F0Y PRN Leighton Ruff, MD      . HYDROmorphone (DILAUDID) injection 0.5-1 mg  0.5-1 mg Intravenous Q4H PRN Romana Juniper A, MD      . insulin aspart (novoLOG) injection 0-9 Units  0-9 Units Subcutaneous Q8H Polly Cobia, RPH   2 Units at 12/09/20 1358  . MEDLINE mouth rinse  15 mL Mouth Rinse [id number] Leighton Ruff, MD   15 mL at 12/09/20 1251  . methocarbamol (ROBAXIN) 500 mg in dextrose 5 % 50 mL IVPB  500 mg Intravenous Q6H PRN Meuth, Brooke A, PA-C      . metoprolol tartrate (LOPRESSOR) injection 5 mg  5 mg Intravenous I5O PRN Leighton Ruff, MD      . ondansetron (ZOFRAN-ODT) disintegrating tablet 4 mg  4 mg Oral Y7X PRN Leighton Ruff, MD       Or  . ondansetron [facility]) injection 4 mg  4 mg Intravenous A1O PRN Leighton Ruff, MD   4 mg at 87/86/76 0551  . pantoprazole (PROTONIX) injection 40 mg  40 mg Intravenous QHS Leighton Ruff, MD   40 mg at 12/08/20 2153  . sodium chloride flush (NS) 0.9 % injection 10-40 mL  10-40 mL Intracatheter Q12H Meuth, Brooke A, PA-C   10 mL at 12/08/20 2208  . sodium chloride flush (NS) 0.9 % injection 10-40 mL  10-40 mL Intracatheter PRN Meuth, Brooke A, PA-C   10 mL at 12/08/20 2154  . TPN ADULT (ION)   Intravenous Continuous TPN Polly Cobia, RPH 30 mL/hr at 12/09/20 0406 Infusion Verify at 12/09/20 0406  . TPN ADULT (ION)   Intravenous Continuous TPN Emiliano Dyer, [facility]         Discharge Medications: Please see discharge summary for a list of discharge medications.  Relevant Imaging Results:  Relevant Lab Results:   Additional Information Stoma type/location: LLQ colostomy. Dr. Marcello Moores in during my visit today for stoma and midline incision assessment.  Stomal assessment/size: 2 inches, red, raised, moist, edematous, lumen in center.  Peristomal assessment: Not seen today   Treatment options for stomal/peristomal skin: skin barrier ring  Output: soft brown stool in pouch with air  Ostomy pouching: 2pc. 2 and 3/4 inch with skin barrier ring  Education provided: Patient is not able to visualize pouching system while in bed, her hands are still too edematous to perform/practice with Lock and Roll closure today. Today we discuss diet and change frequency.   Enrolled patient in [place] Start Discharge program: Yes.  May need to call and request closed end pouching samples.     My partners will see in my absence. I will return Monday ad see if patient still in house.     Supplies for discharge as well (as her [employer]) are in the cupboard above the skin in her  room. Pouches, skin barriers and skin barrier rings.   [place] nursing team will follow, and will remain available to this patient, the nursing and medical teams.    Thanks,  Maudie Flakes, MSN, RN, [place], Arther Abbott   Pager# [phone])  Labaron Digirolamo, Juliann Pulse, RN

## 2020-12-09 NOTE — TOC Progression Note (Addendum)
Transition of Care [facility]) - Progression Note    Patient Details  Name: Sara Logan MRN: [id number] Date of Birth: Sep 25, 1932  [facility] CM/SW Contact  Kostas Marrow, Juliann Pulse, RN Phone Number: 12/09/2020, 9:14 AM  Clinical Narrative:Patient from [place] can go to [facility] for SNF-will await NGT removal prior starting fl2.Currently has-TPN-PICC-R abd drain-colostomy-[place].PT recc SNF.    1:33p-[facility]-rep Katie can accept at ALF. Faxed out for SNF to [place].   Expected Discharge Plan: [place] Barriers to Discharge: Continued Medical Work up  Expected Discharge Plan and Services Expected Discharge Plan: [place]   Discharge Planning Services: CM Consult Post Acute Care Choice: [place] arrangements for the past 2 months: [place]                                       Social Determinants of Health (SDOH) Interventions    Readmission Risk Interventions No flowsheet data found.

## 2020-12-10 LAB — COMPREHENSIVE METABOLIC PANEL
ALT: 12 U/L (ref 0–44)
AST: 16 U/L (ref 15–41)
Albumin: 2.2 g/dL — ABNORMAL LOW (ref 3.5–5.0)
Alkaline Phosphatase: 42 U/L (ref 38–126)
Anion gap: 6 (ref 5–15)
BUN: <5 mg/dL — ABNORMAL LOW (ref 8–23)
CO2: 22 mmol/L (ref 22–32)
Calcium: 7.9 mg/dL — ABNORMAL LOW (ref 8.9–10.3)
Chloride: 112 mmol/L — ABNORMAL HIGH (ref 98–111)
Creatinine, Ser: 0.5 mg/dL (ref 0.44–1.00)
GFR, Estimated: >60 mL/min (ref >60)
Glucose, Bld: 158 mg/dL — ABNORMAL HIGH (ref 70–99)
Potassium: 3.8 mmol/L (ref 3.5–5.1)
Sodium: 140 mmol/L (ref 135–145)
Total Bilirubin: 0.6 mg/dL (ref 0.3–1.2)
Total Protein: 5.2 g/dL — ABNORMAL LOW (ref 6.5–8.1)

## 2020-12-10 LAB — GLUCOSE, CAPILLARY
Glucose-Capillary: 153 mg/dL — ABNORMAL HIGH (ref 70–99)
Glucose-Capillary: 134 mg/dL — ABNORMAL HIGH (ref 70–99)
Glucose-Capillary: 146 mg/dL — ABNORMAL HIGH (ref 70–99)
Glucose-Capillary: 132 mg/dL — ABNORMAL HIGH (ref 70–99)
Glucose-Capillary: 133 mg/dL — ABNORMAL HIGH (ref 70–99)

## 2020-12-10 LAB — MAGNESIUM: Magnesium: 1.5 mg/dL — ABNORMAL LOW (ref 1.7–2.4)

## 2020-12-10 LAB — PHOSPHORUS: Phosphorus: 1.7 mg/dL — ABNORMAL LOW (ref 2.5–4.6)

## 2020-12-10 MED ORDER — TRAVASOL 10 % IV SOLN
INTRAVENOUS | Status: AC
  Filled 2020-12-10: qty 600

## 2020-12-10 MED ORDER — POTASSIUM PHOSPHATES 15 MMOLE/5ML IV SOLN
20.0000 mmol | Freq: Once | INTRAVENOUS | Status: AC
  Administered 2020-12-10: 20 mmol via INTRAVENOUS
  Filled 2020-12-10: qty 6.67

## 2020-12-10 MED ORDER — MAGNESIUM SULFATE 2 GM/50ML IV SOLN
2.0000 g | Freq: Once | INTRAVENOUS | Status: AC
  Administered 2020-12-10: 2 g via INTRAVENOUS
  Filled 2020-12-10: qty 50

## 2020-12-10 NOTE — Plan of Care (Signed)

## 2020-12-10 NOTE — Progress Notes (Signed)
7 Days Post-Op   Subjective/Chief Complaint: DOING ok  NO n/v  But bloated    Objective: Vital signs in last 24 hours: Temp:  [97.8 F (36.6 C)-98.2 F (36.8 C)] 98.2 F (36.8 C) (04/02 0617) Pulse Rate:  [89-99] 96 (04/02 0617) Resp:  [16-20] 20 (04/02 0617) BP: (129-134)/(60-70) 134/60 (04/02 0617) SpO2:  [97 %-100 %] 97 % (04/02 0617) Last BM Date: 12/10/20 (colostomy)  Intake/Output from previous day: 04/01 0701 - 04/02 0700 In: 3352 [P.O.:360; I.V.:2580.9; IV Piggyback:411.1] Out: 1800 [Urine:825; Drains:50; Stool:925] Intake/Output this shift: Total I/O In: -  Out: 50 [Drains:25; Stool:25]   Gen: Alert, NAD, pleasant Pulm: rate and effort normal on room air Abd: Soft,mild distension, appropriately tender, midline incision cdiwith staples present and noerythema or drainage, stoma viable withair andliquid brown stool, JP drain with serosanguinous fluid in bulb Lab Results:  Recent Labs    12/08/20 0509 12/09/20 0446  WBC 10.0 6.9  HGB 8.9* 8.1*  HCT 29.2* 25.5*  PLT 204 172   BMET Recent Labs    12/09/20 0446 12/10/20 0552  NA 138 140  K 3.6 3.8  CL 113* 112*  CO2 20* 22  GLUCOSE 202* 158*  BUN <5* <5*  CREATININE 0.36* 0.50  CALCIUM 7.9* 7.9*   PT/INR No results for input(s): LABPROT, INR in the last 72 hours. ABG No results for input(s): PHART, HCO3 in the last 72 hours.  Invalid input(s): PCO2, PO2  Studies/Results: [place] EKG SITE RITE  Result Date: 12/08/2020 If Site Rite image not attached, placement could not be confirmed due to current cardiac rhythm.   Anti-infectives: Anti-infectives (From admission, onward)   Start     Dose/Rate Route Frequency Ordered Stop   12/02/20 2200  piperacillin-tazobactam (ZOSYN) IVPB 3.375 g        3.375 g 12.5 mL/hr over 240 Minutes Intravenous Every 8 hours 12/02/20 1655 12/08/20 0050   12/02/20 1600  piperacillin-tazobactam (ZOSYN) IVPB 3.375 g        3.375 g 100 mL/hr over 30 Minutes  Intravenous  Once 12/02/20 1557 12/02/20 1646      Assessment/Plan: s/p Procedure(s): EXPLORATORY LAPAROTOMY; HARTMAN'S PROCEDURE; SPLEENIC FLEXURE MOBILIZATION (N/A) COLOSTOMY (N/A) HTN HLD Depression Severe protein calorie malnutrition - prealbumin 7.7 (3/26) Hx of CVA without residual deficit  Physical deconditioning ABL anemia - Hgb stable Hypokalemia/ hypomagnesemia - improved  Diverticulitiswith distal sigmoid obstruction S/pEXPLORATORY LAPAROTOMY; HARTMAN'S PROCEDURE; SPLENIC FLEXURE MOBILIZATION,ENDCOLOSTOMY3/26 Dr. Marcello Moores - POD#7 - surgical path:Diverticulosis with acute diverticulitis and abscess, serosal adhesions, transmural defect;Ischemic-type change, serosal adhesions, transmural defects - continue pelvic JP drain at least 1-2 weeks- output serosanguinous - continue PT/OT, mobilize. Planning on SNF at discharge - [place] new ostomy education - Continue TPN until taking good PO.  FEN: TPN / CLEARS for now  VTE: SCDs, lovenox ID: Zosyn 3/25>>3/31 Point of contact: Michale Weikel [phone] Follow up: Dr. Marcello Moores  LOS: 8 days    Turner Daniels MD  12/10/2020

## 2020-12-10 NOTE — Plan of Care (Signed)
  Problem: Health Behavior/Discharge Planning: Goal: Ability to manage health-related needs will improve Outcome: Progressing Began teaching patient self care of ostomy, burping gas and emptying    Problem: Clinical Measurements: Goal: Ability to maintain clinical measurements within normal limits will improve Outcome: Progressing Goal: Will remain free from infection Outcome: Progressing Goal: Diagnostic test results will improve Outcome: Progressing Goal: Respiratory complications will improve Outcome: Progressing Goal: Cardiovascular complication will be avoided Outcome: Progressing   Problem: Activity: Goal: Risk for activity intolerance will decrease Outcome: Progressing   Problem: Nutrition: Goal: Adequate nutrition will be maintained Outcome: Progressing   Problem: Coping: Goal: Level of anxiety will decrease Outcome: Progressing   Problem: Elimination: Goal: Will not experience complications related to bowel motility Outcome: Progressing Goal: Will not experience complications related to urinary retention Outcome: Progressing   Problem: Pain Managment: Goal: General experience of comfort will improve Outcome: Progressing   Problem: Safety: Goal: Ability to remain free from injury will improve Outcome: Progressing   Problem: Skin Integrity: Goal: Risk for impaired skin integrity will decrease Outcome: Progressing

## 2020-12-10 NOTE — Progress Notes (Signed)
PHARMACY - TOTAL PARENTERAL NUTRITION CONSULT NOTE   Indication: prolonged ileus  Patient Measurements: Height: 5\' 3"  (160 cm) Weight: 66.3 kg (146 lb 2.6 oz) IBW/kg (Calculated) : 52.4 TPN AdjBW (KG): 62.7 Body mass index is 25.89 kg/m. Usual Weight: ~72 kg in Dec 2021  Assessment:  72 yoF with LBO d/t chronic sigmoid diverticulitis with stricture. Status post ex lap with Hartman's colostomy on 3/26; subsequently developed postop ileus. Developing significant electrolyte abnormalities d/t continued high NG output, and Pharmacy consulted to start TPN.  Glucose / Insulin: No Hx DM; BGs noted to be low prior to starting TPN (range 54-61 the past 3 days; goal 100-150) - CBGs now 131-191 - 5 units SSI required Electrolytes: K WNL (3.8) with repeated supplementation and 55meq/L in IVF; Mag now low (1.5). Phos low (1.5) but improved after supplementation; corrected Ca WNL (9.34) Renal: SCr stable WNL; BUN low; Bicarb now low/Cl elevated (likely d/t GI losses); UOP 829ml over past 24hr Hepatic: LFTs WNL; albumin low (3/25); TG 148 Prealbumin: 6.7 I/O: Clamping NGT alternating with LIS. Drain OP appears decreased - MIVF: D5NS with 9mEq KCl at 100 ml/hr GI Imaging: - 3/25: distal sigmoid diverticulitis w/ possible descending colon perforation; distal colonic obstruction Surgeries / Procedures:  - 3/26: Removal of descending/sigmoid colon and proximal rectum; creation of Hartman's colostomy using transverse colon  Central access: PICC 3/31 TPN start date: 3/31  Nutritional Goals RD 4/1 Kcal:  1400-1600kcal/day Protein:  70-80g/day Fluid:  1.4-1.6L/day  TPN at goal rate of 60 mL/hr provides 72 g of protein and 1454 kcals per day  Current Nutrition:  Advanced to clears 4/1  Plan:   Magnesium sulfate 2g IV x 1  Potassium phosphate 2mmol IV x 1 (32meq K+ so additional K+ bolus needed)  Also receiving 100 mEq KCl today from MIVF; will hold off on additional K for  now   Increase TPN to 50 mL/hr 1800  Electrolytes in TPN:   Na - 11mEq/L  K - 37mEq/L  Ca - 54mEq/L  Mg - 53mEq/L   Phos - 39mmol/L  Cl:Ac ratio 1:2  Add standard MVI and trace elements to TPN  Sensitive q8h SSI and adjust as needed   Continue MIVF at 100 mL/hr at 1800 per CCS  Monitor TPN labs on Mon/Thurs  Full panel tomorrow  Peggyann Juba, PharmD, BCPS Pharmacy: [phone] 12/10/2020, 8:17 AM

## 2020-12-10 NOTE — Progress Notes (Signed)
Physical Therapy Treatment Patient Details Name: Sara Logan MRN: [id number] DOB: 11/09/1932 Today's Date: 12/10/2020    History of Present Illness Patient is an 87 year old female with a past medical history significant for HLD, thrombocytopenia, depression, arthritis, stroke, diverticulitis with abscess formation in January 2022. Admitted 12/02/20 with abdominal pain.  S/P EXPLORATORY LAPAROTOMY; HARTMAN'S PROCEDURE; SPLEENIC FLEXURE MOBILIZATION  COLOSTOMY    PT Comments    Pt reports feeling "off" today and weak however agreeable to be OOB.  Pt encouraged to assist with emptying colostomy (little stool but full of air) and then transferred to [facility].  Pt fatigues quickly.  Pt then assisted to recliner as she declined ambulating today due to fatigue.  Continue to recommend SNF upon d/c.    Follow Up Recommendations  SNF     Equipment Recommendations  None recommended by PT    Recommendations for Other Services       Precautions / Restrictions Precautions Precautions: Fall Precaution Comments: colostomy, right JP drain    Mobility  Bed Mobility Overal bed mobility: Needs Assistance   Rolling: Min guard Sidelying to sit: HOB elevated;Min guard       General bed mobility comments: pt utilizes elevated HOB and hand rail    Transfers Overall transfer level: Needs assistance Equipment used: Rolling walker (2 wheeled) Transfers: Sit to/from Stand Sit to Stand: Min assist Stand pivot transfers: Min assist       General transfer comment: verbal cues for hand placement and self assist; leaking BM from rectum with transfer to [facility], after BSC performed a few steps over to recliner; pt declines ambulating today  Ambulation/Gait                 Stairs             Wheelchair Mobility    Modified Rankin (Stroke Patients Only)       Balance                                            Cognition Arousal/Alertness: Awake/alert Behavior  During Therapy: WFL for tasks assessed/performed Overall Cognitive Status: Within Functional Limits for tasks assessed                                        Exercises      General Comments        Pertinent Vitals/Pain Pain Assessment: Faces Faces Pain Scale: Hurts little more Pain Location: JP site, abdominal incision Pain Descriptors / Indicators: Sore Pain Intervention(s): Monitored during session;Repositioned    Home Living                      Prior Function            PT Goals (current goals can now be found in the care plan section) Progress towards PT goals: Progressing toward goals    Frequency    Min 2X/week      PT Plan Current plan remains appropriate;Frequency needs to be updated    Co-evaluation              AM-PAC PT "6 Clicks" Mobility   Outcome Measure  Help needed turning from your back to your side while in a flat bed without using bedrails?: A Little Help  needed moving from lying on your back to sitting on the side of a flat bed without using bedrails?: A Little Help needed moving to and from a bed to a chair (including a wheelchair)?: A Little Help needed standing up from a chair using your arms (e.g., wheelchair or bedside chair)?: A Little Help needed to walk in hospital room?: A Little Help needed climbing 3-5 steps with a railing? : A Lot 6 Click Score: 17    End of Session Equipment Utilized During Treatment: Gait belt Activity Tolerance: Patient limited by fatigue Patient left: in chair;with call bell/phone within reach;with chair alarm set Nurse Communication: Mobility status PT Visit Diagnosis: Difficulty in walking, not elsewhere classified (R26.2);Unsteadiness on feet (R26.81)     Time: 1610-9604 PT Time Calculation (min) (ACUTE ONLY): 27 min  Charges:  $Therapeutic Activity: 23-37 mins                    Jannette Spanner PT, DPT Acute Rehabilitation Services Pager: [phone] Office:  [phone]  York Ram E 12/10/2020, 3:32 PM

## 2020-12-11 ENCOUNTER — Encounter (HOSPITAL_COMMUNITY): Payer: Self-pay

## 2020-12-11 LAB — COMPREHENSIVE METABOLIC PANEL
ALT: 12 U/L (ref 0–44)
AST: 17 U/L (ref 15–41)
Albumin: 2.2 g/dL — ABNORMAL LOW (ref 3.5–5.0)
Alkaline Phosphatase: 41 U/L (ref 38–126)
Anion gap: 5 (ref 5–15)
BUN: <5 mg/dL — ABNORMAL LOW (ref 8–23)
CO2: 24 mmol/L (ref 22–32)
Calcium: 8.3 mg/dL — ABNORMAL LOW (ref 8.9–10.3)
Chloride: 109 mmol/L (ref 98–111)
Creatinine, Ser: <0.3 mg/dL — ABNORMAL LOW (ref 0.44–1.00)
Glucose, Bld: 149 mg/dL — ABNORMAL HIGH (ref 70–99)
Potassium: 4.3 mmol/L (ref 3.5–5.1)
Sodium: 138 mmol/L (ref 135–145)
Total Bilirubin: 0.5 mg/dL (ref 0.3–1.2)
Total Protein: 5.2 g/dL — ABNORMAL LOW (ref 6.5–8.1)

## 2020-12-11 LAB — GLUCOSE, CAPILLARY
Glucose-Capillary: 127 mg/dL — ABNORMAL HIGH (ref 70–99)
Glucose-Capillary: 139 mg/dL — ABNORMAL HIGH (ref 70–99)
Glucose-Capillary: 136 mg/dL — ABNORMAL HIGH (ref 70–99)

## 2020-12-11 LAB — PHOSPHORUS: Phosphorus: 2.7 mg/dL (ref 2.5–4.6)

## 2020-12-11 LAB — MAGNESIUM: Magnesium: 1.7 mg/dL (ref 1.7–2.4)

## 2020-12-11 MED ORDER — TRAVASOL 10 % IV SOLN
INTRAVENOUS | Status: AC
  Filled 2020-12-11: qty 600

## 2020-12-11 MED ORDER — MAGNESIUM SULFATE 4 GM/100ML IV SOLN
4.0000 g | Freq: Once | INTRAVENOUS | Status: AC
  Administered 2020-12-11: 4 g via INTRAVENOUS
  Filled 2020-12-11: qty 100

## 2020-12-11 NOTE — Progress Notes (Signed)
8 Days Post-Op   Subjective/Chief Complaint: TOLERATING CLEARS less bloated    Objective: Vital signs in last 24 hours: Temp:  [98.5 F (36.9 C)-98.6 F (37 C)] 98.6 F (37 C) (04/03 0626) Pulse Rate:  [98-104] 104 (04/03 0626) Resp:  [20-22] 20 (04/03 0626) BP: (118-140)/(58-76) 140/76 (04/03 0626) SpO2:  [96 %-99 %] 99 % (04/03 0626) Last BM Date: 12/10/20  Intake/Output from previous day: 04/02 0701 - 04/03 0700 In: 4432.4 [P.O.:900; I.V.:2985.6; IV Piggyback:546.9] Out: 2140 [Urine:1300; Drains:40; Stool:800] Intake/Output this shift: Total I/O In: 600 [P.O.:600] Out: 600 [Urine:600]   Gen: Alert, NAD, pleasant Pulm: rate and effort normal on room air Abd: Soft,mild distension,appropriately tender, midline incision cdiwith staples present and noerythema or drainage, stoma viable withair andliquid brown stool, JP drain with serosanguinous fluid in bulb Lab Results:  Recent Labs    12/09/20 0446  WBC 6.9  HGB 8.1*  HCT 25.5*  PLT 172   BMET Recent Labs    12/10/20 0552 12/11/20 0405  NA 140 138  K 3.8 4.3  CL 112* 109  CO2 22 24  GLUCOSE 158* 149*  BUN <5* <5*  CREATININE 0.50 <0.30*  CALCIUM 7.9* 8.3*   PT/INR No results for input(s): LABPROT, INR in the last 72 hours. ABG No results for input(s): PHART, HCO3 in the last 72 hours.  Invalid input(s): PCO2, PO2  Studies/Results: No results found.  Anti-infectives: Anti-infectives (From admission, onward)   Start     Dose/Rate Route Frequency Ordered Stop   12/02/20 2200  piperacillin-tazobactam (ZOSYN) IVPB 3.375 g        3.375 g 12.5 mL/hr over 240 Minutes Intravenous Every 8 hours 12/02/20 1655 12/08/20 0050   12/02/20 1600  piperacillin-tazobactam (ZOSYN) IVPB 3.375 g        3.375 g 100 mL/hr over 30 Minutes Intravenous  Once 12/02/20 1557 12/02/20 1646      Assessment/Plan: s/p Procedure(s): EXPLORATORY LAPAROTOMY; HARTMAN'S PROCEDURE; SPLEENIC FLEXURE MOBILIZATION  (N/A) COLOSTOMY (N/A) HTN HLD Depression Severe protein calorie malnutrition - prealbumin 7.7 (3/26) Hx of CVA without residual deficit  Physical deconditioning ABL anemia - Hgb stable Hypokalemia/ hypomagnesemia -improved  Diverticulitiswith distal sigmoid obstruction S/pEXPLORATORY LAPAROTOMY; HARTMAN'S PROCEDURE; SPLENIC FLEXURE MOBILIZATION,ENDCOLOSTOMY3/26 Dr. Marcello Moores - POD#8 - surgical path:Diverticulosis with acute diverticulitis and a - continue pelvic JP drain at least 1-2 weeks- output serosanguinous - continue PT/OT, mobilize. Planning on SNF at discharge - [place] new ostomy education - Continue TPN until taking good PO.  FEN:TPN  wean starting Monday soft diet  VTE: SCDs, lovenox ID: [email or web address] 3/25>>3/31 Point of contact: Wealthy Danielski [phone] Follow up: Dr. Marcello Moores  LOS: 9 days    Turner Daniels  MD  12/11/2020

## 2020-12-11 NOTE — Progress Notes (Signed)
PHARMACY - TOTAL PARENTERAL NUTRITION CONSULT NOTE   Indication: prolonged ileus  Patient Measurements: Height: 5\' 3"  (160 cm) Weight: 66.3 kg (146 lb 2.6 oz) IBW/kg (Calculated) : 52.4 TPN AdjBW (KG): 62.7 Body mass index is 25.89 kg/m. Usual Weight: ~72 kg in Dec 2021  Assessment:  59 yoF with LBO d/t chronic sigmoid diverticulitis with stricture. Status post ex lap with Hartman's colostomy on 3/26; subsequently developed postop ileus. Developing significant electrolyte abnormalities d/t continued high NG output, and Pharmacy consulted to start TPN.  Glucose / Insulin: No Hx DM; BGs noted to be low prior to starting TPN (range 54-61; goal 100-150) - CBGs in past 24hr: 127-146 - 3 units SSI required Electrolytes: K WNL (4.3) with repeated supplementation and 55meq/L in IVF (182meq per 24hr); Mag at low end of goal (1.7). Phos now WNL (2.7) after repeated supplementation; corrected Ca WNL (9.74) Renal: SCr < 1.0 stable; BUN low; Bicarb//Cl now WNL (likely d/t GI losses); UOP 1333ml recorded over past 24hr Hepatic: LFTs WNL; albumin low (3/25); TG 148 Prealbumin: 6.7 (4/1) I/O: Clamping NGT alternating with LIS. Drain OP appears decreased - MIVF: D5NS with 31mEq KCl at 100 ml/hr GI Imaging: - 3/25: distal sigmoid diverticulitis w/ possible descending colon perforation; distal colonic obstruction Surgeries / Procedures:  - 3/26: Removal of descending/sigmoid colon and proximal rectum; creation of Hartman's colostomy using transverse colon  Central access: PICC 3/31 TPN start date: 3/31  Nutritional Goals RD 4/1 Kcal:  1400-1600kcal/day Protein:  70-80g/day Fluid:  1.4-1.6L/day  TPN at goal rate of 60 mL/hr provides 72 g of protein and 1454 kcals per day  Current Nutrition:  Advanced to clears 4/1 - tolerating per RN and patient asking to advance  Plan:   Magnesium sulfate 4g IV x 1   Continue TPN at 50 mL/hr 1800 - will not advance since tolerating clears  Provides  60g protein, 1212 kcal  Electrolytes in TPN:   Na - 64mEq/L  K - 73mEq/L  Ca - 85mEq/L  Mg - 7.57mEq/L increase  Phos - 18mmol/L  Cl:Ac ratio 1:2  Add standard MVI and trace elements to TPN  Sensitive q8h SSI and adjust as needed   Continue MIVF at 100 mL/hr at 1800 per CCS (150 ml/hr total IVF with TPN)  Monitor TPN labs on Mon/Thurs  Peggyann Juba, PharmD, BCPS Pharmacy: [phone] 12/11/2020, 8:16 AM

## 2020-12-11 NOTE — Plan of Care (Signed)

## 2020-12-12 LAB — PHOSPHORUS: Phosphorus: 3.4 mg/dL (ref 2.5–4.6)

## 2020-12-12 LAB — COMPREHENSIVE METABOLIC PANEL
ALT: 12 U/L (ref 0–44)
AST: 17 U/L (ref 15–41)
Albumin: 2.2 g/dL — ABNORMAL LOW (ref 3.5–5.0)
Alkaline Phosphatase: 45 U/L (ref 38–126)
Anion gap: 4 — ABNORMAL LOW (ref 5–15)
BUN: 6 mg/dL — ABNORMAL LOW (ref 8–23)
CO2: 25 mmol/L (ref 22–32)
Calcium: 8.4 mg/dL — ABNORMAL LOW (ref 8.9–10.3)
Chloride: 109 mmol/L (ref 98–111)
Creatinine, Ser: 0.34 mg/dL — ABNORMAL LOW (ref 0.44–1.00)
GFR, Estimated: >60 mL/min (ref >60)
Glucose, Bld: 122 mg/dL — ABNORMAL HIGH (ref 70–99)
Potassium: 4.5 mmol/L (ref 3.5–5.1)
Sodium: 138 mmol/L (ref 135–145)
Total Bilirubin: 0.3 mg/dL (ref 0.3–1.2)
Total Protein: 5.3 g/dL — ABNORMAL LOW (ref 6.5–8.1)

## 2020-12-12 LAB — CBC
HCT: 24.3 % — ABNORMAL LOW (ref 36.0–46.0)
Hemoglobin: 7.4 g/dL — ABNORMAL LOW (ref 12.0–15.0)
MCH: 28.4 pg (ref 26.0–34.0)
MCHC: 30.5 g/dL (ref 30.0–36.0)
MCV: 93.1 fL (ref 80.0–100.0)
Platelets: 144 10*3/uL — ABNORMAL LOW (ref 150–400)
RBC: 2.61 MIL/uL — ABNORMAL LOW (ref 3.87–5.11)
RDW: 17.1 % — ABNORMAL HIGH (ref 11.5–15.5)
WBC: 7.9 10*3/uL (ref 4.0–10.5)
nRBC: 0 % (ref 0.0–0.2)

## 2020-12-12 LAB — DIFFERENTIAL
Abs Immature Granulocytes: 0.1 10*3/uL — ABNORMAL HIGH (ref 0.00–0.07)
Basophils Absolute: 0 10*3/uL (ref 0.0–0.1)
Basophils Relative: 0 %
Eosinophils Absolute: 0.3 10*3/uL (ref 0.0–0.5)
Eosinophils Relative: 4 %
Immature Granulocytes: 1 %
Lymphocytes Relative: 11 %
Lymphs Abs: 0.8 10*3/uL (ref 0.7–4.0)
Monocytes Absolute: 0.7 10*3/uL (ref 0.1–1.0)
Monocytes Relative: 9 %
Neutro Abs: 6 10*3/uL (ref 1.7–7.7)
Neutrophils Relative %: 75 %

## 2020-12-12 LAB — TRIGLYCERIDES: Triglycerides: 153 mg/dL — ABNORMAL HIGH (ref <150)

## 2020-12-12 LAB — GLUCOSE, CAPILLARY
Glucose-Capillary: 139 mg/dL — ABNORMAL HIGH (ref 70–99)
Glucose-Capillary: 118 mg/dL — ABNORMAL HIGH (ref 70–99)
Glucose-Capillary: 131 mg/dL — ABNORMAL HIGH (ref 70–99)

## 2020-12-12 LAB — PREALBUMIN: Prealbumin: 10.3 mg/dL — ABNORMAL LOW (ref 18–38)

## 2020-12-12 LAB — MAGNESIUM: Magnesium: 2.2 mg/dL (ref 1.7–2.4)

## 2020-12-12 MED ORDER — TRAVASOL 10 % IV SOLN
INTRAVENOUS | Status: AC
  Filled 2020-12-12: qty 360

## 2020-12-12 MED ORDER — ENSURE ENLIVE PO LIQD
237.0000 mL | Freq: Three times a day (TID) | ORAL | Status: DC
  Administered 2020-12-12 – 2020-12-15 (×8): 237 mL via ORAL

## 2020-12-12 MED ORDER — TRAMADOL HCL 50 MG PO TABS
50.0000 mg | ORAL_TABLET | Freq: Four times a day (QID) | ORAL | Status: DC | PRN
  Administered 2020-12-12: 50 mg via ORAL
  Filled 2020-12-12 (×2): qty 1

## 2020-12-12 MED ORDER — DEXTROSE-NACL 5-0.9 % IV SOLN: INTRAVENOUS | Status: DC

## 2020-12-12 NOTE — Consult Note (Addendum)
[place] Nurse ostomy follow up Stoma type/location: LLQ colostomy Stomal assessment/size: 1 1/2, red and viable, above skin level Peristomal assessment: intact, abdomen distended  Treatment options for stomal/peristomal skin: barrier ring and 2 piece 2 3/4" pouch Output: mod amt flatus and liquid brown stool Ostomy pouching: Applied 2pc. 2 3/4" pouch Kellie Simmering # 2 wafer, # [id number] pouch, # [id number] barrier ring.   Education provided: Pt watched the process using a hand held mirror and assisted with the application steps.  She is able to open and close velcro to empty.  Reviewed pouching routines and ordering supplies.  4 sets of barrier rings, pouches, and wafers at the bedside, along with educational materials. Pt plans to discharge to SNF where she will have further ostomy assistance. Enrolled patient in [facility] Start Discharge program: Yes previously Julien Girt MSN, [place], [place], [place], [place]

## 2020-12-12 NOTE — Discharge Instructions (Addendum)
Colostomy Home Guide, Adult  Colostomy surgery is done to create an opening in the front of the abdomen for stool (feces) to leave the body through an ostomy (stoma). Part of the large intestine is attached to the stoma. A bag, also called a pouch, is fitted over the stoma. Stool and gas will collect in the bag. After surgery, you will need to empty and change your colostomy bag as needed. You will also need to care for your stoma. How to care for the stoma Your stoma should look pink, red, and moist, like the inside of your cheek. Soon after surgery, the stoma may be swollen, but this swelling will go away within 6 weeks. To care for the stoma:  Keep the skin around the stoma clean and dry.  Use a clean, soft washcloth to gently wash the stoma and the skin around it. Clean using a circular motion, and wipe away from the stoma opening, not toward it. ? Use warm water and only use cleansers recommended by your health care provider. ? Rinse the stoma area with plain water. ? Dry the area around the stoma well.  Use stoma powder or ointment on your skin only as told by your health care provider. Do not use any other powders, gels, wipes, or creams on the skin around the stoma.  Check the stoma area every day for signs of infection. Check for: ? New or worsening redness, swelling, or pain. ? New or increased fluid or blood. ? Pus or warmth.  Measure the stoma opening regularly and record the size. Watch for changes. (It is normal for the stoma to get smaller as swelling goes away.) Share this information with your health care provider. How to empty the colostomy bag Empty your bag at bedtime and whenever it is one-third to one-half full. Do not let the bag get more than half-full with stool or gas. The bag could leak if it gets too full. Some colostomy bags have a built-in gas release valve that releases gas often throughout the day. Follow these basic steps: 1. Wash your hands with soap and  water. 2. Sit far back on the toilet seat. 3. Put several pieces of toilet paper into the toilet water. This will prevent splashing as you empty stool into the toilet. 4. Remove the clip or the hook-and-loop fastener from the tail end of the bag. 5. Unroll the tail, then empty the stool into the toilet. 6. Clean the tail with toilet paper or a moist towelette. 7. Reroll the tail, and close it with the clip or the hook-and-loop fastener. 8. Wash your hands again.   How to change the colostomy bag Change your bag every 3-4 days or as often as told by your health care provider. Also change the bag if it is leaking or separating from the skin, or if your skin around the stoma looks or feels irritated. Irritated skin may be a sign that the bag is leaking. Always have colostomy supplies with you, and follow these basic steps: 1. Wash your hands with soap and water. Have paper towels or tissues nearby to clean any discharge. 2. Remove the old bag and skin barrier. Use your fingers or a warm cloth to gently push the skin away from the barrier. 3. Clean the stoma area with water or with mild soap and water, as directed. Use water to rinse away any soap. 4. Dry the skin. You may use the cool setting on a hair dryer to do  this. 5. Use a tracing pattern (template) to cut the skin barrier to the size needed. 6. If you are using a two-piece bag, attach the bag and the skin barrier to each other. Add the barrier ring, if you use one. 7. If directed, apply stoma powder or skin barrier gel to the skin. 8. Warm the skin barrier with your hands, or blow with a hair dryer for 5-10 seconds. 9. Remove the paper from the adhesive strip of the skin barrier. 10. Press the adhesive strip onto the skin around the stoma. 11. Gently rub the skin barrier onto the skin. This creates heat that helps the barrier to stick. 12. Apply stoma tape to the edges of the skin barrier, if desired. 42. Wash your hands again. General  recommendations  Avoid wearing tight clothes or having anything press directly on your stoma or bag. Change your clothing whenever it is soiled or damp.  You may shower or bathe with the bag on or off. Do not use harsh or oily soaps or lotions. Dry the skin and bag after bathing.  Store all supplies in a cool, dry place. Do not leave supplies in extreme heat because some parts can melt or not stick as well.  Whenever you leave home, take extra clothing and an extra skin barrier and bag with you.  If your bag gets wet, you can dry it with a hair dryer on the cool setting.  To prevent odor, you may put drops of ostomy deodorizer in the bag.  If recommended by your health care provider, put ostomy lubricant inside the bag. This helps stool to slide out of the bag more easily and completely. Contact a health care provider if:  You have new or worsening redness, swelling, or pain around your stoma.  You have new or increased fluid or blood coming from your stoma.  Your stoma feels warm to the touch.  You have pus coming from your stoma.  Your stoma extends in or out farther than normal.  You need to change your bag every day.  You have a fever. Get help right away if:  Your stool is bloody.  You have nausea or you vomit.  You have trouble breathing. Summary  Measure your stoma opening regularly and record the size. Watch for changes.  Empty your bag at bedtime and whenever it is one-third to one-half full. Do not let the bag get more than half-full with stool or gas.  Change your bag every 3-4 days or as often as told by your health care provider.  Whenever you leave home, take extra clothing and an extra skin barrier and bag with you. This information is not intended to replace advice given to you by your health care provider. Make sure you discuss any questions you have with your health care provider. Document Revised: 04/28/2020 Document Reviewed: 04/28/2020 Elsevier  Patient Education  2021 [facility], [place] [phone]  OPEN ABDOMINAL SURGERY: POST OP INSTRUCTIONS  Always review your discharge instruction sheet given to you by the facility where your surgery was performed.  IF YOU HAVE DISABILITY OR FAMILY LEAVE FORMS, YOU MUST BRING THEM TO THE OFFICE FOR PROCESSING.  PLEASE DO NOT GIVE THEM TO YOUR DOCTOR.  1. A prescription for pain medication may be given to you upon discharge.  Take your pain medication as prescribed, if needed.  If narcotic pain medicine is not needed, then you may take  acetaminophen (Tylenol) or ibuprofen (Advil) as needed. 2. Take your usually prescribed medications unless otherwise directed. 3. If you need a refill on your pain medication, please contact your pharmacy. They will contact our office to request authorization.  Prescriptions will not be filled after 5pm or on week-ends. 4. You should follow a light diet the first few days after arrival home, such as soup and crackers, pudding, etc.unless your doctor has advised otherwise. A high-fiber, low fat diet can be resumed as tolerated.   Be sure to include lots of fluids daily. Most patients will experience some swelling and bruising on the chest and neck area.  Ice packs will help.  Swelling and bruising can take several days to resolve 5. Most patients will experience some swelling and bruising in the area of the incision. Ice pack will help. Swelling and bruising can take several days to resolve..  6. It is common to experience some constipation if taking pain medication after surgery.  Increasing fluid intake and taking a stool softener will usually help or prevent this problem from occurring.  A mild laxative (Milk of Magnesia or Miralax) should be taken according to package directions if there are no bowel movements after 48 hours. 7.  ACTIVITIES:  You may resume regular (light) daily activities beginning the next day--such as daily  self-care, walking, climbing stairs--gradually increasing activities as tolerated.  You may have sexual intercourse when it is comfortable.  Refrain from any heavy lifting or straining until approved by your doctor. a. You may drive when you no longer are taking prescription pain medication, you can comfortably wear a seatbelt, and you can safely maneuver your car and apply brakes 8. You should see your doctor in the office for a follow-up appointment approximately two weeks after your surgery.  Make sure that you call for this appointment within a day or two after you arrive home to insure a convenient appointment time.   WHEN TO CALL YOUR DOCTOR: 1. Fever over 101.0 2. Inability to urinate 3. Nausea and/or vomiting 4. Extreme swelling or bruising 5. Continued bleeding from incision. 6. Increased pain, redness, or drainage from the incision. 7. Difficulty swallowing or breathing 8. Muscle cramping or spasms. 9. Numbness or tingling in hands or feet or around lips.  The clinic staff is available to answer your questions during regular business hours.  Please don't hesitate to call and ask to speak to one of the nurses if you have concerns.  For further questions, please visit [email or web address]

## 2020-12-12 NOTE — Progress Notes (Signed)
[facility] Progress Note  9 Days Post-Op  Subjective: CC-  Overall feeling well but frustrated that she is not making quicker progress. Got OOB to chair yesterday. Denies abdominal pain, n/v. Tolerating soft diet but not eating a lot. Ostomy productive.   Objective: Vital signs in last 24 hours: Temp:  [98.2 F (36.8 C)-98.3 F (36.8 C)] 98.2 F (36.8 C) (04/04 0503) Pulse Rate:  [92-105] 92 (04/04 0503) Resp:  [16-19] 16 (04/04 0503) BP: (122-128)/(51-61) 128/61 (04/04 0503) SpO2:  [95 %-99 %] 98 % (04/04 0503) Last BM Date: 12/10/20  Intake/Output from previous day: 04/03 0701 - 04/04 0700 In: 2999.4 [P.O.:960; I.V.:2039.4] Out: 2575 [Urine:2250; Drains:50; Stool:275] Intake/Output this shift: Total I/O In: 500 [P.O.:500] Out: -   PE: Gen: Alert, NAD, pleasant Pulm: rate and effort normal on room air Abd: Soft,mild distension,nontender, midline incision cdiwith staples present and noerythema or drainage, stoma viable withair andliquid brown stool, JP drain with serosanguinous fluid in bulb   Lab Results:  Recent Labs    12/12/20 0325  WBC 7.9  HGB 7.4*  HCT 24.3*  PLT 144*   BMET Recent Labs    12/11/20 0405 12/12/20 0325  NA 138 138  K 4.3 4.5  CL 109 109  CO2 24 25  GLUCOSE 149* 122*  BUN <5* 6*  CREATININE <0.30* 0.34*  CALCIUM 8.3* 8.4*   PT/INR No results for input(s): LABPROT, INR in the last 72 hours. CMP     Component Value Date/Time   NA 138 12/12/2020 0325   K 4.5 12/12/2020 0325   CL 109 12/12/2020 0325   CO2 25 12/12/2020 0325   GLUCOSE 122 (H) 12/12/2020 0325   BUN 6 (L) 12/12/2020 0325   CREATININE 0.34 (L) 12/12/2020 0325   CREATININE 0.67 04/26/2020 1158   CALCIUM 8.4 (L) 12/12/2020 0325   PROT 5.3 (L) 12/12/2020 0325   ALBUMIN 2.2 (L) 12/12/2020 0325   AST 17 12/12/2020 0325   ALT 12 12/12/2020 0325   ALKPHOS 45 12/12/2020 0325   BILITOT 0.3 12/12/2020 0325   GFRNONAA >60 12/12/2020 0325   GFRAA >60  06/07/2016 0530   Lipase     Component Value Date/Time   LIPASE 32 12/02/2020 1143       Studies/Results: No results found.  Anti-infectives: Anti-infectives (From admission, onward)   Start     Dose/Rate Route Frequency Ordered Stop   12/02/20 2200  piperacillin-tazobactam (ZOSYN) IVPB 3.375 g        3.375 g 12.5 mL/hr over 240 Minutes Intravenous Every 8 hours 12/02/20 1655 12/08/20 0050   12/02/20 1600  piperacillin-tazobactam (ZOSYN) IVPB 3.375 g        3.375 g 100 mL/hr over 30 Minutes Intravenous  Once 12/02/20 1557 12/02/20 1646       Assessment/Plan HTN HLD Depression Severe protein calorie malnutrition - prealbumin 7.7 (3/26)>> 10.3 (4/4) Hx of CVA without residual deficit  Physical deconditioning ABL anemia  Hypokalemia/ hypomagnesemia -improved  Diverticulitiswith distal sigmoid obstruction S/pEXPLORATORY LAPAROTOMY; HARTMAN'S PROCEDURE; SPLENIC FLEXURE MOBILIZATION,ENDCOLOSTOMY3/26 Dr. Marcello Moores - POD#9 - surgical path:Diverticulosis with acute diverticulitis and a - continue pelvic JP drain at least 1-2 weeks- output serosanguinous, likely can remove prior to discharge - continue PT/OT, mobilize. Planning on SNF at discharge, [facility] consult in place - WOCfollowingfor new ostomy education  ZGY:FVCB TPN, soft diet, Ensure VTE: SCDs, lovenox ID: [email or web address] 3/25>>3/31 Point of contact: Jaynell Castagnola [phone] Follow up: Dr. Marcello Moores  Plan: Wean TPN and continue soft diet. She did  not like Boost - switch to Ensure. Will ask dietician to also see. Likely will be medically ready for discharge to SNF in 1-2 days.   LOS: 10 days    [facility] 12/12/2020, 9:41 AM Please see Amion for pager number during day hours 7:00am-4:30pm

## 2020-12-12 NOTE — Care Management Important Message (Signed)
Important Message  Patient Details IM Letter given to the Patient. Name: Sara Logan MRN: [id number] Date of Birth: 11/30/1932   Medicare Important Message Given:  Yes     Kerin Salen 12/12/2020, 1:32 PM

## 2020-12-12 NOTE — Progress Notes (Signed)
PHARMACY - TOTAL PARENTERAL NUTRITION CONSULT NOTE   Indication: prolonged ileus  Patient Measurements: Height: 5\' 3"  (160 cm) Weight: 66.3 kg (146 lb 2.6 oz) IBW/kg (Calculated) : 52.4 TPN AdjBW (KG): 62.7 Body mass index is 25.89 kg/m. Usual Weight: ~72 kg in Dec 2021  Assessment:  75 yoF with LBO d/t chronic sigmoid diverticulitis with stricture. Status post ex lap with Hartman's colostomy on 3/26; subsequently developed postop ileus. Developing significant electrolyte abnormalities d/t continued high NG output, and Pharmacy consulted to start TPN.  Glucose / Insulin: No Hx DM; BGs noted to be low prior to starting TPN (range 54-61; goal range 100-180) - CBGs in past 24hr: 122-139 - 3 units SSI required Electrolytes: All WNL, including CorrCa (9.8) Renal: SCr < 1.0 stable; BUN low; Bicarb//Cl now WNL; UOP 2250 mL recorded over past 24hr + 1 unmeasured Hepatic: LFTs WNL; albumin low; TG 153 Prealbumin: 6.7 (4/1) > 10.3 (4/4) I/O: Drain output: 50 mL; strict I/O's not charted (unmeasured urine and stool output) - MIVF: D5NS with 87mEq KCl at 100 mL/hr. Pt was requiring additional KCl supplementation, likely 2/2 GI losses GI Imaging: - 3/25: distal sigmoid diverticulitis w/ possible descending colon perforation; distal colonic obstruction Surgeries / Procedures:  - 3/26: Removal of descending/sigmoid colon and proximal rectum; creation of Hartman's colostomy using transverse colon -4/1: NGT removed  Central access: PICC 3/31 TPN start date: 3/31  Nutritional Goals: RD 4/1 Kcal:  1400-1600kcal/day Protein:  70-80g/day Fluid:  1.4-1.6L/day  TPN at goal rate of 60 mL/hr provides 72 g of protein and 1454 kcals per day  Current Nutrition:  Diet: Soft. Ensure Enlive TID TPN at 50 mL/hr  Plan: Reduce TPN to 1/2 of goal rate tonight per CSS, plan to d/c TPN tomorrow  At 1800:  Reduce TPN to 30 mL/hr  Provides 36g protein, 727 kcal  Electrolytes in TPN: No changes  Na -  81mEq/L  K - 58mEq/L  Ca - 44mEq/L  Mg - 7.66mEq/L increase  Phos - 3mmol/L  Cl:Ac ratio 1:2  Add standard MVI and trace elements to TPN  Continue CBG check and sSSI q8h  Remove KCl from MIVF. D5 NS @ 100 mL/hr  Monitor TPN labs on Mon/Thurs, recheck electrolytes with AM labs tomorrow  Lenis Noon, PharmD 12/12/20 10:01 AM

## 2020-12-12 NOTE — TOC Progression Note (Signed)
Transition of Care [facility]) - Progression Note    Patient Details  Name: Sara Logan MRN: [id number] Date of Birth: 1933/08/09  [facility]) CM/SW Contact  Blaike Vickers, Juliann Pulse, RN Phone Number: 12/12/2020, 2:18 PM  Clinical Narrative:  Patient for return back to [place] [facility]-ALF level they can manage-TPN to be weaned prior d/c,JP drain to be removed prior d/c. Rep Karlene Einstein @ [facility] tel#[phone]. Going to [facility] rm#51,nsg call report tel#[phone]. Will need covid ordered within 24hrs of d/c.     Expected Discharge Plan: [place] Barriers to Discharge: Continued Medical Work up  Expected Discharge Plan and Services Expected Discharge Plan: [place]   Discharge Planning Services: CM Consult Post Acute Care Choice: [place] arrangements for the past 2 months: [place]                                       Social Determinants of Health (SDOH) Interventions    Readmission Risk Interventions No flowsheet data found.

## 2020-12-13 ENCOUNTER — Inpatient Hospital Stay (HOSPITAL_COMMUNITY): Payer: Medicare Other

## 2020-12-13 LAB — BASIC METABOLIC PANEL
Anion gap: 7 (ref 5–15)
BUN: 9 mg/dL (ref 8–23)
CO2: 26 mmol/L (ref 22–32)
Calcium: 8.7 mg/dL — ABNORMAL LOW (ref 8.9–10.3)
Chloride: 103 mmol/L (ref 98–111)
Creatinine, Ser: 0.42 mg/dL — ABNORMAL LOW (ref 0.44–1.00)
GFR, Estimated: >60 mL/min (ref >60)
Glucose, Bld: 134 mg/dL — ABNORMAL HIGH (ref 70–99)
Potassium: 3.8 mmol/L (ref 3.5–5.1)
Sodium: 136 mmol/L (ref 135–145)

## 2020-12-13 LAB — SARS CORONAVIRUS 2 (TAT 6-24 HRS): SARS Coronavirus 2: NEGATIVE

## 2020-12-13 LAB — PHOSPHORUS: Phosphorus: 4.2 mg/dL (ref 2.5–4.6)

## 2020-12-13 LAB — MAGNESIUM: Magnesium: 1.8 mg/dL (ref 1.7–2.4)

## 2020-12-13 LAB — GLUCOSE, CAPILLARY: Glucose-Capillary: 139 mg/dL — ABNORMAL HIGH (ref 70–99)

## 2020-12-13 MED ORDER — VENLAFAXINE HCL ER 150 MG PO CP24
150.0000 mg | ORAL_CAPSULE | Freq: Every day | ORAL | Status: DC
  Administered 2020-12-14 – 2020-12-15 (×2): 150 mg via ORAL
  Filled 2020-12-13 (×2): qty 1

## 2020-12-13 MED ORDER — PROSOURCE PLUS PO LIQD
30.0000 mL | Freq: Every day | ORAL | Status: DC
  Administered 2020-12-13: 30 mL via ORAL
  Filled 2020-12-13 (×2): qty 30

## 2020-12-13 MED ORDER — PANTOPRAZOLE SODIUM 40 MG PO TBEC
40.0000 mg | DELAYED_RELEASE_TABLET | Freq: Every day | ORAL | Status: DC
  Administered 2020-12-13 – 2020-12-14 (×2): 40 mg via ORAL
  Filled 2020-12-13 (×2): qty 1

## 2020-12-13 MED ORDER — NYSTATIN 100000 UNIT/GM EX CREA
TOPICAL_CREAM | Freq: Two times a day (BID) | CUTANEOUS | Status: DC
  Administered 2020-12-15: 1 via TOPICAL
  Filled 2020-12-13: qty 15

## 2020-12-13 NOTE — Plan of Care (Signed)

## 2020-12-13 NOTE — Progress Notes (Signed)
[facility] Progress Note  10 Days Post-Op  Subjective: CC-  States that she is having difficult taking a deep breath this morning. Denies productive cough or chest pain. O2 sats stable on room air. She feels a little more bloated. Denies n/v. Tolerating diet but still not eating a lot. She did take in 3 Ensure yesterday. States that she did not get to get out of bed yesterday.  Objective: Vital signs in last 24 hours: Temp:  [97.8 F (36.6 C)-98.6 F (37 C)] 98.2 F (36.8 C) (04/05 0536) Pulse Rate:  [99-102] 99 (04/05 0536) Resp:  [16-20] 20 (04/04 2043) BP: (122-133)/(57-72) 129/57 (04/05 0536) SpO2:  [94 %-98 %] 94 % (04/05 0536) Last BM Date: 12/12/20  Intake/Output from previous day: 04/04 0701 - 04/05 0700 In: 2696.9 [P.O.:500; I.V.:2196.9] Out: 1750 [Urine:1700; Stool:50] Intake/Output this shift: Total I/O In: -  Out: 650 [Urine:650]  PE: Gen: Alert, NAD, pleasant Pulm: decreased breath sounds bilaterally, no wheezing, rate and effort normal on room air Abd: distended but soft, +BS,nontender, midline incision cdiwithout erythema or drainage, stoma viable withair andsmall amount of liquid brown stool in pouch  Lab Results:  Recent Labs    12/12/20 0325  WBC 7.9  HGB 7.4*  HCT 24.3*  PLT 144*   BMET Recent Labs    12/12/20 0325 12/13/20 0430  NA 138 136  K 4.5 3.8  CL 109 103  CO2 25 26  GLUCOSE 122* 134*  BUN 6* 9  CREATININE 0.34* 0.42*  CALCIUM 8.4* 8.7*   PT/INR No results for input(s): LABPROT, INR in the last 72 hours. CMP     Component Value Date/Time   NA 136 12/13/2020 0430   K 3.8 12/13/2020 0430   CL 103 12/13/2020 0430   CO2 26 12/13/2020 0430   GLUCOSE 134 (H) 12/13/2020 0430   BUN 9 12/13/2020 0430   CREATININE 0.42 (L) 12/13/2020 0430   CREATININE 0.67 04/26/2020 1158   CALCIUM 8.7 (L) 12/13/2020 0430   PROT 5.3 (L) 12/12/2020 0325   ALBUMIN 2.2 (L) 12/12/2020 0325   AST 17 12/12/2020 0325   ALT 12  12/12/2020 0325   ALKPHOS 45 12/12/2020 0325   BILITOT 0.3 12/12/2020 0325   GFRNONAA >60 12/13/2020 0430   GFRAA >60 06/07/2016 0530   Lipase     Component Value Date/Time   LIPASE 32 12/02/2020 1143       Studies/Results: No results found.  Anti-infectives: Anti-infectives (From admission, onward)   Start     Dose/Rate Route Frequency Ordered Stop   12/02/20 2200  piperacillin-tazobactam (ZOSYN) IVPB 3.375 g        3.375 g 12.5 mL/hr over 240 Minutes Intravenous Every 8 hours 12/02/20 1655 12/08/20 0050   12/02/20 1600  piperacillin-tazobactam (ZOSYN) IVPB 3.375 g        3.375 g 100 mL/hr over 30 Minutes Intravenous  Once 12/02/20 1557 12/02/20 1646       Assessment/Plan HTN HLD Depression Severe protein calorie malnutrition - prealbumin 7.7 (3/26)>> 10.3 (4/4) Hx of CVA without residual deficit  Physical deconditioning ABL anemia  Hypokalemia/ hypomagnesemia -improved  Diverticulitiswith distal sigmoid obstruction S/pEXPLORATORY LAPAROTOMY; HARTMAN'S PROCEDURE; SPLENIC FLEXURE MOBILIZATION,ENDCOLOSTOMY3/26 Dr. Marcello Moores - POD#10 - surgical path:Diverticulosis with acute diverticulitis and abscess, serosal adhesions, transmural defect;Ischemic-type change, serosal adhesions, transmural defects  - staples and JP drain removed 4/4 - WOCfollowingfor new ostomy education - continue PT/OT, mobilize. Planning on SNF at discharge. Covid test sent today 4/5 - No n/v and  ostomy productive but patient is a little more distended today which I think is making it difficult for her to take a deep breath. Check CXR and abdominal film. Pulm toilet. Needs to mobilize more - PT to see today, also discussed with patient's nurse getting up every shift and at least to EOB for meals. May be ready for discharge to SNF tomorrow.   FEN: d/c TPN, KVO IVF, reg diet, Ensure/ supplements VTE: SCDs, lovenox ID: [email or web address] 3/25>>3/31 Point of contact: Nerissa Constantin [phone] Follow up:  Dr. Marcello Moores   LOS: 11 days    [place], [facility] 12/13/2020, 10:35 AM Please see Amion for pager number during day hours 7:00am-4:30pm

## 2020-12-13 NOTE — Progress Notes (Signed)
Physical Therapy Treatment Patient Details Name: Sara Logan MRN: [id number] DOB: 1932-11-08 Today's Date: 12/13/2020    History of Present Illness Patient is an 87 year old female with a past medical history significant for HLD, thrombocytopenia, depression, arthritis, stroke, diverticulitis with abscess formation in January 2022. Admitted 12/02/20 with abdominal pain.  S/P EXPLORATORY LAPAROTOMY; HARTMAN'S PROCEDURE; SPLEENIC FLEXURE MOBILIZATION  COLOSTOMY    PT Comments    Pt back in bed.  General Comments: AxO x 2 pleasant but appears "worried" and having some difficulty expressing her needs.  Also required some repeat directions, slighly "lost" but very sweet lady General bed mobility comments: required Min Assist with upper body to transition to EOB and Min Assist B LE support up back to bed.  General transfer comment: 50% VC's on proper hand placement and safety with turns from elevated bed to [facility] then back to bed.  General Gait Details: pt declined to amb "worried" about leaving the room Pt plans to D/C to SNF.  Follow Up Recommendations  SNF     Equipment Recommendations  None recommended by PT    Recommendations for Other Services       Precautions / Restrictions Precautions Precaution Comments: colostomy, urinary incontinence    Mobility  Bed Mobility Overal bed mobility: Needs Assistance Bed Mobility: Supine to Sit     Supine to sit: Min assist Sit to supine: Min assist   General bed mobility comments: required Min Assist with upper body to transition to EOB and Min Assist B LE support up back to bed    Transfers Overall transfer level: Needs assistance Equipment used: None Transfers: Sit to/from [facility] Sit to Stand: Min assist Stand pivot transfers: Min assist       General transfer comment: 50% VC's on proper hand placement and safety with turns from elevated bed to [facility] then back to bed  Ambulation/Gait         Gait  velocity: decr   General Gait Details: pt declined to amb "worried" about leaving the room   Stairs             Wheelchair Mobility    Modified Rankin (Stroke Patients Only)       Balance                                            Cognition Arousal/Alertness: Awake/alert Behavior During Therapy: WFL for tasks assessed/performed Overall Cognitive Status: Within Functional Limits for tasks assessed                                 General Comments: AxO x 2 pleasant but appears "worried" and having some difficulty expressing her needs.  Also required some repeat directions, slighly "lost" but very sweet lady      Exercises      General Comments        Pertinent Vitals/Pain Pain Assessment: Faces Faces Pain Scale: Hurts little more Pain Location: Abdominal incision Pain Descriptors / Indicators: Grimacing;Operative site guarding;Tender Pain Intervention(s): Monitored during session;Repositioned    Home Living                      Prior Function            PT Goals (current goals can now be found in the  care plan section) Progress towards PT goals: Progressing toward goals    Frequency    Min 2X/week      PT Plan Current plan remains appropriate;Frequency needs to be updated    Co-evaluation              AM-PAC PT "6 Clicks" Mobility   Outcome Measure  Help needed turning from your back to your side while in a flat bed without using bedrails?: A Little Help needed moving from lying on your back to sitting on the side of a flat bed without using bedrails?: A Little Help needed moving to and from a bed to a chair (including a wheelchair)?: A Little Help needed standing up from a chair using your arms (e.g., wheelchair or bedside chair)?: A Little Help needed to walk in hospital room?: A Little Help needed climbing 3-5 steps with a railing? : A Lot 6 Click Score: 17    End of Session Equipment  Utilized During Treatment: Gait belt Activity Tolerance: Patient limited by fatigue Patient left: in bed;with call bell/phone within reach;with bed alarm set Nurse Communication: Mobility status PT Visit Diagnosis: Difficulty in walking, not elsewhere classified (R26.2);Unsteadiness on feet (R26.81)     Time: 8016-5537 PT Time Calculation (min) (ACUTE ONLY): 24 min  Charges:  $Therapeutic Activity: 23-37 mins                     Rica Koyanagi  PTA Acute  Rehabilitation Services Pager      [phone] Office      [phone]

## 2020-12-13 NOTE — TOC Progression Note (Addendum)
Transition of Care [facility]) - Progression Note    Patient Details  Name: VICENTA OLDS MRN: [id number] Date of Birth: January 08, 1933  Transition [facility]) CM/SW Contact  Joaquin Courts, RN Phone Number: 12/13/2020, 11:31 AM  Clinical Narrative:    Per progression rounds today, patient not medically stable to dc today, anticipate dc 4/6, JP drain removed 4/4, TPN to stop tonight.  VM left for [facility] at [facility] updating on anticipated dc date.  Covid test is pending.   Expected Discharge Plan: [place] Barriers to Discharge: Continued Medical Work up  Expected Discharge Plan and Services Expected Discharge Plan: [place]   Discharge Planning Services: CM Consult Post Acute Care Choice: [place] arrangements for the past 2 months: [place]                                       Social Determinants of Health (SDOH) Interventions    Readmission Risk Interventions No flowsheet data found.

## 2020-12-13 NOTE — Progress Notes (Signed)
PHARMACY - TOTAL PARENTERAL NUTRITION CONSULT NOTE   Indication: prolonged ileus  Patient Measurements: Height: 5\' 3"  (160 cm) Weight: 66.3 kg (146 lb 2.6 oz) IBW/kg (Calculated) : 52.4 TPN AdjBW (KG): 62.7 Body mass index is 25.89 kg/m. Usual Weight: ~72 kg in Dec 2021  Assessment:  76 yoF with LBO d/t chronic sigmoid diverticulitis with stricture. Status post ex lap with Hartman's colostomy on 3/26; subsequently developed postop ileus. Developing significant electrolyte abnormalities d/t continued high NG output, and Pharmacy consulted to start TPN.  Glucose / Insulin: No Hx DM; BGs noted to be low prior to starting TPN (range 54-61; goal range 100-180) - CBGs in past 24hr: 118-134 - 1 units SSI required Electrolytes: All WNL, including CorrCa (10.1) Renal: SCr < 1.0 stable; BUN WNL; Bicarb//Cl WNL; UOP 1700 mL recorded over past 24hr Hepatic: LFTs WNL; albumin low; TG 153 Prealbumin: 6.7 (4/1) > 10.3 (4/4) I/O: Drain removed - MIVF: D5NS @ 100 mL/hr GI Imaging: - 3/25: distal sigmoid diverticulitis w/ possible descending colon perforation; distal colonic obstruction Surgeries / Procedures:  - 3/26: Removal of descending/sigmoid colon and proximal rectum; creation of Hartman's colostomy using transverse colon -4/1: NGT removed  Central access: PICC 3/31 TPN start date: 3/31  Nutritional Goals: RD 4/1 Kcal:  1400-1600kcal/day Protein:  70-80g/day Fluid:  1.4-1.6L/day  TPN at goal rate of 60 mL/hr provides 72 g of protein and 1454 kcals per day  Current Nutrition:  Diet: Advanced to regular; Ensure Enlive TID TPN at 30 mL/hr (1/2 of goal rate)  Plan: Wean off of TPN today   Current TPN to infuse at 1/2 of goal rate until 1800 this evening when order expires  Will discontinue all TPN associated labs as well as q8h CBG checks and SSI  Pharmacy to sign off  Lenis Noon, PharmD 12/13/20 7:29 AM

## 2020-12-13 NOTE — Progress Notes (Signed)
Nutrition Follow-up  RD working remotely.  DOCUMENTATION CODES:   Not applicable  INTERVENTION:  - continue Ensure Enlive TID, each supplement provides 350 kcal and 20 grams of protein. - will order 30 ml Prosource Plus once/day, each supplement provides 100 kcal and 15 grams protein. - will order 1 tablet multivitamin with minerals/day.    NUTRITION DIAGNOSIS:   Inadequate oral intake related to acute illness as evidenced by other (comment) (current diet order). -diet advanced with inadequate intakes  GOAL:   Patient will meet greater than or equal to 90% of their needs -unmet  MONITOR:   PO intake,Supplement acceptance,Labs,Weight trends  REASON FOR ASSESSMENT:   Consult Assessment of nutrition requirement/status  ASSESSMENT:   87 y/o female with h/o depression and stroke who is admitted with diverticular abscess now s/p Hartmann's and end colostomy 3/26  Significant Events: 3/25- admission; NGT placed 3/26- Hartmann's and end colostomy 3/27- initial RD assessment 3/31- double lumen PICC placed in R brachial; TPN initiation 4/1- NGT removed; diet advanced to CLD 4/3- diet advanced to Soft 4/4- diet advanced to Regular; TPN decreased from 50 ml/hr to 30 ml/hr   The only documented intake since diet advancement began was 25% of lunch (CLD) on 4/1. Will order ONS as outlined above.   Ensure Enlive ordered TID starting yesterday AM and she accepted all 3 bottles offered to her so far.  She has not been weighed since 3/31. Non-pitting edema to BUE documented in the edema section of flow sheet.    Per Surgery note 4/4: - tolerating diet but intakes minimal - plan for d/c to SNF when ready   Labs reviewed; CBG: 139 mg/dl, creatinine: 0.42 mg/dl, Ca: 8.7 mg/dl. Medications reviewed; 40 mg oral protonix/day. IVF; D5 @ 100 ml/hr (408 kcal/24 hours).   Diet Order:   Diet Order            Diet regular Room service appropriate? Yes; Fluid consistency: Thin  Diet  effective now                 EDUCATION NEEDS:   Education needs have been addressed  Skin:  Skin Assessment: Skin Integrity Issues: Skin Integrity Issues:: Incisions Incisions: abdomen (3/26)  Last BM:  4/4 (225 via colostomy)  Height:   Ht Readings from Last 1 Encounters:  12/02/20 5\' 3"  (1.6 m)    Weight:   Wt Readings from Last 1 Encounters:  12/08/20 66.3 kg    Estimated Nutritional Needs:  Kcal:  1400-1600kcal/day Protein:  70-80g/day Fluid:  1.4-1.6L/day      Jarome Matin, MS, RD, LDN, CNSC Inpatient Clinical Dietitian RD pager # available in [place]  After hours/weekend pager # available in [facility]

## 2020-12-13 NOTE — Progress Notes (Signed)
Occupational Therapy Treatment Patient Details Name: Sara Logan MRN: [id number] DOB: December 16, 1932 Today's Date: 12/13/2020    History of present illness Patient is an 87 year old female with a past medical history significant for HLD, thrombocytopenia, depression, arthritis, stroke, diverticulitis with abscess formation in January 2022. Admitted 12/02/20 with abdominal pain.  S/P EXPLORATORY LAPAROTOMY; HARTMAN'S PROCEDURE; SPLEENIC FLEXURE MOBILIZATION  COLOSTOMY   OT comments  Patient met lying supine in bed in agreement with OT treatment session. Patient able to don LB clothing seated EOB with Min A to thread RLE and steadying assist with at least unilateral UE support on RW to hike underwear over hips in standing. Patient progressed to recliner with use of RW and Min guard requiring increased time/effort. Patient continues to be limited by deficits listed below 2/2 diagnosis above and would benefit from continued acute OT services to maximize safety and independence with self-care tasks in prep for safe d/c to next level of care. Given patient CLOF, recommendation for SNF rehab remains appropriate.    Follow Up Recommendations  SNF;Supervision/Assistance - 24 hour    Equipment Recommendations  None recommended by OT    Recommendations for Other Services      Precautions / Restrictions Precautions Precautions: Fall Precaution Comments: colostomy, urinary incontinence Restrictions Weight Bearing Restrictions: No       Mobility Bed Mobility Overal bed mobility: Needs Assistance Bed Mobility: Sit to Supine Rolling: Supervision Sidelying to sit: HOB elevated;Min guard       General bed mobility comments: Supervision A and increased time/effort 2/2 tender abdomen.    Transfers Overall transfer level: Needs assistance Equipment used: Rolling walker (2 wheeled) Transfers: Sit to/from Stand Sit to Stand: Min assist Stand pivot transfers: Min assist       General transfer  comment: Light Min A for sit to stand x3-4 trials from EOB and low recliner with cues for hand placement. Increased time/effort 2/2 generalized weakness.    Balance Overall balance assessment: Needs assistance Sitting-balance support: No upper extremity supported Sitting balance-Leahy Scale: Good     Standing balance support: During functional activity;Single extremity supported Standing balance-Leahy Scale: Poor Standing balance comment: Reliant on at least unilateral UE support with clothing management in static standing.                           ADL either performed or assessed with clinical judgement   ADL Overall ADL's : Needs assistance/impaired                     Lower Body Dressing: Minimal assistance;Sit to/from stand Lower Body Dressing Details (indicate cue type and reason): Min A to thread BLE through mesh panties. Steadying assist to hike pants over hips in standing with at least unilateral UE support on RW. Toilet Transfer: Designer, television/film set Details (indicate cue type and reason): Simulated with transfer to recliner and use of RW.         Functional mobility during ADLs: Min guard;Rolling walker General ADL Comments: Continues to be limited by generalized weakness and mild balance deficits.     Vision       Perception     Praxis      Cognition Arousal/Alertness: Awake/alert Behavior During Therapy: WFL for tasks assessed/performed Overall Cognitive Status: Within Functional Limits for tasks assessed  General Comments: Reports hallucinations since hospital admission        Exercises     Shoulder Instructions       General Comments JP drain removed. Clean, dry dressing at incision on abdomen.    Pertinent Vitals/ Pain       Pain Assessment: Faces Faces Pain Scale: Hurts little more Pain Location: Abdominal incision Pain Descriptors / Indicators: Sore Pain  Intervention(s): Limited activity within patient's tolerance;Monitored during session;Repositioned  Home Living                                          Prior Functioning/Environment              Frequency  Min 2X/week        Progress Toward Goals  OT Goals(current goals can now be found in the care plan section)  Progress towards OT goals: Progressing toward goals  Acute Rehab OT Goals Patient Stated Goal: To go to SNF rehab. OT Goal Formulation: With patient Time For Goal Achievement: 12/20/20 Potential to Achieve Goals: Good ADL Goals Pt Will Perform Lower Body Dressing: with supervision;with adaptive equipment;sit to/from stand;sitting/lateral leans Pt Will Transfer to Toilet: with supervision;ambulating;regular height toilet Pt Will Perform Toileting - Clothing Manipulation and hygiene: with supervision;sit to/from stand;sitting/lateral leans Additional ADL Goal #1: Patient will tolerate 8 min standing activity at supervision level in order to participate in self care tasks.  Plan Discharge plan remains appropriate;Frequency remains appropriate    Co-evaluation                 AM-PAC OT "6 Clicks" Daily Activity     Outcome Measure   Help from another person eating meals?: None Help from another person taking care of personal grooming?: A Little Help from another person toileting, which includes using toliet, bedpan, or urinal?: A Lot Help from another person bathing (including washing, rinsing, drying)?: A Lot Help from another person to put on and taking off regular upper body clothing?: A Little Help from another person to put on and taking off regular lower body clothing?: A Little 6 Click Score: 17    End of Session Equipment Utilized During Treatment: Rolling walker  OT Visit Diagnosis: Other abnormalities of gait and mobility (R26.89);Muscle weakness (generalized) (M62.81);Pain   Activity Tolerance Patient tolerated treatment  well   Patient Left in chair;with call bell/phone within reach;with chair alarm set   Nurse Communication Mobility status        Time: 1030-1100 OT Time Calculation (min): 30 min  Charges: OT General Charges $OT Visit: 1 Visit OT Treatments $Self Care/Home Management : 23-37 mins  Marisela Line H. OTR/L Supplemental OT, Department of rehab services [phone]  Sara Logan R H. 12/13/2020, 12:05 PM

## 2020-12-14 LAB — BASIC METABOLIC PANEL
Anion gap: 10 (ref 5–15)
BUN: 13 mg/dL (ref 8–23)
CO2: 26 mmol/L (ref 22–32)
Calcium: 8.9 mg/dL (ref 8.9–10.3)
Chloride: 105 mmol/L (ref 98–111)
Creatinine, Ser: 0.45 mg/dL (ref 0.44–1.00)
GFR, Estimated: >60 mL/min (ref >60)
Glucose, Bld: 116 mg/dL — ABNORMAL HIGH (ref 70–99)
Potassium: 3.6 mmol/L (ref 3.5–5.1)
Sodium: 141 mmol/L (ref 135–145)

## 2020-12-14 LAB — CBC
HCT: 23.6 % — ABNORMAL LOW (ref 36.0–46.0)
Hemoglobin: 7.2 g/dL — ABNORMAL LOW (ref 12.0–15.0)
MCH: 28.7 pg (ref 26.0–34.0)
MCHC: 30.5 g/dL (ref 30.0–36.0)
MCV: 94 fL (ref 80.0–100.0)
Platelets: 170 10*3/uL (ref 150–400)
RBC: 2.51 MIL/uL — ABNORMAL LOW (ref 3.87–5.11)
RDW: 17.5 % — ABNORMAL HIGH (ref 11.5–15.5)
WBC: 8.8 10*3/uL (ref 4.0–10.5)
nRBC: 0 % (ref 0.0–0.2)

## 2020-12-14 LAB — MAGNESIUM: Magnesium: 1.7 mg/dL (ref 1.7–2.4)

## 2020-12-14 LAB — PHOSPHORUS: Phosphorus: 4.3 mg/dL (ref 2.5–4.6)

## 2020-12-14 MED ORDER — ASCORBIC ACID 500 MG PO TABS
500.0000 mg | ORAL_TABLET | Freq: Two times a day (BID) | ORAL | Status: DC
  Administered 2020-12-14 – 2020-12-15 (×3): 500 mg via ORAL
  Filled 2020-12-14 (×3): qty 1

## 2020-12-14 MED ORDER — DOCUSATE SODIUM 100 MG PO CAPS
100.0000 mg | ORAL_CAPSULE | Freq: Two times a day (BID) | ORAL | Status: DC
  Administered 2020-12-14 – 2020-12-15 (×3): 100 mg via ORAL
  Filled 2020-12-14 (×3): qty 1

## 2020-12-14 MED ORDER — FERROUS SULFATE 325 (65 FE) MG PO TABS
325.0000 mg | ORAL_TABLET | Freq: Two times a day (BID) | ORAL | Status: DC
  Administered 2020-12-15: 325 mg via ORAL
  Filled 2020-12-14 (×2): qty 1

## 2020-12-14 MED ORDER — POLYETHYLENE GLYCOL 3350 17 G PO PACK: 17.0000 g | PACK | Freq: Every day | ORAL | Status: DC | PRN

## 2020-12-14 NOTE — Progress Notes (Signed)
[facility] Progress Note  11 Days Post-Op  Subjective: CC-  Up in chair. States that she felt a little shaky when she woke up but this has improved after eating breakfast. VSS. Colostomy with 800cc output recorded last 24 hours. Patient states that abdominal bloating has improved. Denies n/v. No longer SOB. WBC 8.8, afebrile  Objective: Vital signs in last 24 hours: Temp:  [98.1 F (36.7 C)-98.6 F (37 C)] 98.4 F (36.9 C) (04/05 1954) Pulse Rate:  [101-105] 101 (04/05 1954) Resp:  [18-20] 20 (04/05 1954) BP: (106-124)/(54-57) 124/57 (04/05 1954) SpO2:  [95 %-99 %] 99 % (04/05 1954) Last BM Date: 12/14/20  Intake/Output from previous day: 04/05 0701 - 04/06 0700 In: -  Out: 2500 [Urine:1700; Stool:800] Intake/Output this shift: No intake/output data recorded.  PE: Gen: Alert, NAD, pleasant Pulm: rate and effort normal on room air Abd: soft, mild distension, +BS,nontender, midline incision cdiwithout erythema or drainage, stoma viable withair andsmall amount of liquid brown stool in pouch  Lab Results:  Recent Labs    12/12/20 0325 12/14/20 0410  WBC 7.9 8.8  HGB 7.4* 7.2*  HCT 24.3* 23.6*  PLT 144* 170   BMET Recent Labs    12/13/20 0430 12/14/20 0410  NA 136 141  K 3.8 3.6  CL 103 105  CO2 26 26  GLUCOSE 134* 116*  BUN 9 13  CREATININE 0.42* 0.45  CALCIUM 8.7* 8.9   PT/INR No results for input(s): LABPROT, INR in the last 72 hours. CMP     Component Value Date/Time   NA 141 12/14/2020 0410   K 3.6 12/14/2020 0410   CL 105 12/14/2020 0410   CO2 26 12/14/2020 0410   GLUCOSE 116 (H) 12/14/2020 0410   BUN 13 12/14/2020 0410   CREATININE 0.45 12/14/2020 0410   CREATININE 0.67 04/26/2020 1158   CALCIUM 8.9 12/14/2020 0410   PROT 5.3 (L) 12/12/2020 0325   ALBUMIN 2.2 (L) 12/12/2020 0325   AST 17 12/12/2020 0325   ALT 12 12/12/2020 0325   ALKPHOS 45 12/12/2020 0325   BILITOT 0.3 12/12/2020 0325   GFRNONAA >60 12/14/2020 0410    GFRAA >60 06/07/2016 0530   Lipase     Component Value Date/Time   LIPASE 32 12/02/2020 1143       Studies/Results: DG CHEST PORT 1 VIEW  Result Date: 12/13/2020 CLINICAL DATA:  Abdominal pain. EXAM: PORTABLE CHEST 1 VIEW COMPARISON:  December 03, 2020. FINDINGS: The heart size and mediastinal contours are within normal limits. Both lungs are clear. No pneumothorax or pleural effusion is noted. Right-sided PICC line is noted with tip in expected position of cavoatrial junction. The visualized skeletal structures are unremarkable. IMPRESSION: No active disease. Electronically Signed   By: Marijo Conception M.D.   On: 12/13/2020 16:26   DG Abd Portable 2V  Result Date: 12/13/2020 CLINICAL DATA:  Abdominal pain. EXAM: PORTABLE ABDOMEN - 2 VIEW COMPARISON:  December 02, 2020. FINDINGS: The bowel gas pattern is normal. There is no evidence of free air. No radio-opaque calculi or other significant radiographic abnormality is seen. IMPRESSION: Negative. Electronically Signed   By: Marijo Conception M.D.   On: 12/13/2020 16:25    Anti-infectives: Anti-infectives (From admission, onward)   Start     Dose/Rate Route Frequency Ordered Stop   12/02/20 2200  piperacillin-tazobactam (ZOSYN) IVPB 3.375 g        3.375 g 12.5 mL/hr over 240 Minutes Intravenous Every 8 hours 12/02/20 1655 12/08/20 0050  12/02/20 1600  piperacillin-tazobactam (ZOSYN) IVPB 3.375 g        3.375 g 100 mL/hr over 30 Minutes Intravenous  Once 12/02/20 1557 12/02/20 1646       Assessment/Plan HTN HLD Depression Severe protein calorie malnutrition - prealbumin 7.7 (3/26)>> 10.3 (4/4) Hx of CVA without residual deficit  Physical deconditioning ABL anemia - start supplemental iron and vitamin C Hypokalemia/ hypomagnesemia-improved  Diverticulitiswith distal sigmoid obstruction S/pEXPLORATORY LAPAROTOMY; HARTMAN'S PROCEDURE; SPLENIC FLEXURE MOBILIZATION,ENDCOLOSTOMY3/26 Dr. Marcello Moores - POD#11 - surgical  path:Diverticulosis with acute diverticulitis and abscess, serosal adhesions, transmural defect;Ischemic-type change, serosal adhesions, transmural defects  - staples and JP drain removed 4/4 - WOCfollowingfor new ostomy education - continue PT/OT, mobilize. Planning on SNF at discharge. Covid test 4/5 negative - tolerating diet and ostomy functioning  FEN: KVO IVF, reg diet, Ensure/ supplements VTE: SCDs, lovenox ID: [email or web address] 3/25>>3/31 Point of contact: Franci Oshana [phone] Follow up: Dr. Marcello Moores  Plan: Patient doing well today. Medically stable for discharge if dispo arranged. Patient has some questions about ALF needs - I have reached out to [facility] team and waiting to hear back. I attempted to call and update her daughter but did not get an answer, will try again later.    LOS: 12 days    [facility] 12/14/2020, 8:53 AM Please see Amion for pager number during day hours 7:00am-4:30pm

## 2020-12-14 NOTE — Plan of Care (Signed)
  Problem: Health Behavior/Discharge Planning: Goal: Ability to manage health-related needs will improve Outcome: Progressing   Problem: Activity: Goal: Risk for activity intolerance will decrease Outcome: Progressing   Problem: Coping: Goal: Level of anxiety will decrease Outcome: Progressing   Problem: Safety: Goal: Ability to remain free from injury will improve Outcome: Progressing   

## 2020-12-14 NOTE — TOC Progression Note (Addendum)
Transition of Care [facility]) - Progression Note    Patient Details  Name: Sara Logan MRN: [id number] Date of Birth: 10-Jan-1933  [facility] CM/SW Contact  Ross Ludwig,  Phone Number: 12/14/2020, 5:18 PM  Clinical Narrative:     CSW spoke to [facility] they can accept patient tomorrow.  CSW updated [facility] PA Brooke.  Per [facility] they would like her discharged earlier then later.  CSW to facilitate discharge planning.  Patient will go to room [address], report to be given to [phone].  CSW updated patient's daughter [name], and she is aware that patient is discharging tomorrow as long as she is medically ready for discharge.  Expected Discharge Plan: [place] Barriers to Discharge: Continued Medical Work up  Expected Discharge Plan and Services Expected Discharge Plan: [place]   Discharge Planning Services: CM Consult Post Acute Care Choice: [place] arrangements for the past 2 months: [place]                                       Social Determinants of Health (SDOH) Interventions    Readmission Risk Interventions No flowsheet data found.

## 2020-12-14 NOTE — Discharge Summary (Signed)
[facility] Discharge Summary   Patient ID: Sara Logan MRN: [id number] DOB/AGE: 11/06/1932 87 y.o.  Admit date: 12/02/2020 Discharge date: 12/15/2020  Admitting Diagnosis: Diverticulitiswith distal sigmoid obstruction  Discharge Diagnosis Diverticulitiswith distal sigmoid obstruction  HTN HLD Depression Severe protein calorie malnutrition Hx of CVA without residual deficit  Physical deconditioning ABL anemia  Hypokalemia/ hypomagnesemia  Consultants None  Imaging: DG CHEST PORT 1 VIEW  Result Date: 12/13/2020 CLINICAL DATA:  Abdominal pain. EXAM: PORTABLE CHEST 1 VIEW COMPARISON:  December 03, 2020. FINDINGS: The heart size and mediastinal contours are within normal limits. Both lungs are clear. No pneumothorax or pleural effusion is noted. Right-sided PICC line is noted with tip in expected position of cavoatrial junction. The visualized skeletal structures are unremarkable. IMPRESSION: No active disease. Electronically Signed   By: Marijo Conception M.D.   On: 12/13/2020 16:26   DG Abd Portable 2V  Result Date: 12/13/2020 CLINICAL DATA:  Abdominal pain. EXAM: PORTABLE ABDOMEN - 2 VIEW COMPARISON:  December 02, 2020. FINDINGS: The bowel gas pattern is normal. There is no evidence of free air. No radio-opaque calculi or other significant radiographic abnormality is seen. IMPRESSION: Negative. Electronically Signed   By: Marijo Conception M.D.   On: 12/13/2020 16:25    Procedures Dr. Marcello Moores (12/03/2020) - EXPLORATORY LAPAROTOMY; HARTMAN'S PROCEDURE; [facility] Course:  Sara Logan is an 87yo female PMH HTN, HLD, depression, and hx of CVA without residual deficit, who presented to [facility] 12/02/20 with worsening LLQ abdominal pain.  Workup showed large bowel obstruction secondary to diverticulitis with free air around the descending colon concerning for bowel perforation.  Patient was admitted to the surgical service and taken to  the operating room for exploratory laparotomy, Hartman's procedure, splenic flexure mobilization, and end colostomy. Tolerated procedure well and was transferred to the floor.  She completed 5 days of IV zosyn postoperatively. She did have a mild ileus postoperatively as expected therefore was started on TPN for supplemental nutrition. Once bowel function to returned her diet was advanced as tolerated and TPN was weaned off. Wound ostomy continence nurse was consulted for new ostomy education. JP drain and abdominal staples removed on 12/12/20. Patient worked with therapies during this admission who recommended SNF when medically stable for discharge. On POD12, the patient was voiding well, tolerating diet, having bowel function, working well with therapies, pain well controlled, vital signs stable, incisions c/d/i and felt stable for discharge to SNF.  Patient will follow up as below and knows to call with questions or concerns.     Physical Exam: Gen: Alert, NAD, pleasant Pulm: rate and effort normal on room air HCW:CBJS, mild distension, +BS,nontender, midline incision cdiwithouterythema or drainage, stoma viable withair and no stoolin pouch  Allergies as of 12/15/2020      Reactions   Bactrim [sulfamethoxazole-trimethoprim] Nausea Only   Ditropan [oxybutynin]    Made patient feel bad   Sulfa Antibiotics    Other reaction(s): stomach upset      Medication List    STOP taking these medications   bisacodyl 10 MG suppository Commonly known as: DULCOLAX     TAKE these medications   acetaminophen 325 MG tablet Commonly known as: TYLENOL Take 2 tablets (650 mg total) by mouth every 6 (six) hours as needed for mild pain or moderate pain.   alum & mag hydroxide-simeth 200-200-20 MG/5ML suspension Commonly known as: MAALOX/MYLANTA Take 30 mLs by mouth every 4 (four) hours as needed for indigestion  or heartburn.   amLODipine 5 MG tablet Commonly known as: NORVASC TAKE 1 TABLET ONCE  DAILY.   aspirin 325 MG EC tablet Take 1 tablet (325 mg total) by mouth daily.   Biotin 10 MG Tabs Take 10 mg by mouth daily.   CHELATED MAGNESIUM PO Take 300 mg by mouth daily.   docusate sodium 100 MG capsule Commonly known as: COLACE Take 1 capsule (100 mg total) by mouth 2 (two) times daily.   feeding supplement Liqd Take 237 mLs by mouth 3 (three) times daily between meals.   (feeding supplement) PROSource Plus liquid Take 30 mLs by mouth daily.   IRON PO Take 325 mg by mouth daily.   latanoprost 0.005 % ophthalmic solution Commonly known as: XALATAN Place 1 drop into the left eye at bedtime.   magnesium hydroxide 400 MG/5ML suspension Commonly known as: MILK OF MAGNESIA Take 30 mLs by mouth daily as needed for mild constipation.   nystatin cream Commonly known as: MYCOSTATIN Apply topically 2 (two) times daily.   polyethylene glycol 17 g packet Commonly known as: MIRALAX / GLYCOLAX Take 17 g by mouth daily.   rosuvastatin 10 MG tablet Commonly known as: CRESTOR TAKE 1 TABLET ONCE DAILY.   venlafaxine XR 75 MG 24 hr capsule Commonly known as: EFFEXOR-XR TAKE 2 CAPSULES (150MG ) BY MOUTH DAILY.   vitamin B-12 1000 MCG tablet Commonly known as: CYANOCOBALAMIN Take 1,000 mcg by mouth daily.   vitamin C 500 MG tablet Commonly known as: ASCORBIC ACID Take 500 mg by mouth daily.   Vitamin D3 50 MCG (2000 UT) capsule Take 2,000 Units by mouth daily.   ZINC-220 PO Take 220 mg by mouth daily.         Follow-up Information    Leighton Ruff, MD. Go on 2/50/5397.   Specialties: General Surgery, Colon and Rectal Surgery Why: Your appointment is 4/19 at 10:20am Please arrive 15 minutes early to check in. Contact information: [place] [address] [phone]        Eulas Post, MD. Call.   Specialty: Family Medicine Why: Call to arrange post-hospitalization follow up appointment with your primary care  physician Contact information: [place] [address] [phone]               Signed: Wellington Hampshire, [facility] 12/15/2020, 8:56 AM Please see Amion for pager number during day hours 7:00am-4:30pm

## 2020-12-15 DIAGNOSIS — I10 Essential (primary) hypertension: Secondary | ICD-10-CM | POA: Diagnosis not present

## 2020-12-15 DIAGNOSIS — I69398 Other sequelae of cerebral infarction: Secondary | ICD-10-CM | POA: Diagnosis not present

## 2020-12-15 DIAGNOSIS — Z933 Colostomy status: Secondary | ICD-10-CM | POA: Diagnosis not present

## 2020-12-15 DIAGNOSIS — M6281 Muscle weakness (generalized): Secondary | ICD-10-CM | POA: Diagnosis not present

## 2020-12-15 DIAGNOSIS — J302 Other seasonal allergic rhinitis: Secondary | ICD-10-CM | POA: Diagnosis not present

## 2020-12-15 DIAGNOSIS — Z7401 Bed confinement status: Secondary | ICD-10-CM | POA: Diagnosis not present

## 2020-12-15 DIAGNOSIS — E876 Hypokalemia: Secondary | ICD-10-CM | POA: Diagnosis not present

## 2020-12-15 DIAGNOSIS — K5900 Constipation, unspecified: Secondary | ICD-10-CM | POA: Diagnosis not present

## 2020-12-15 DIAGNOSIS — R29898 Other symptoms and signs involving the musculoskeletal system: Secondary | ICD-10-CM | POA: Diagnosis not present

## 2020-12-15 DIAGNOSIS — E785 Hyperlipidemia, unspecified: Secondary | ICD-10-CM | POA: Diagnosis not present

## 2020-12-15 DIAGNOSIS — F339 Major depressive disorder, recurrent, unspecified: Secondary | ICD-10-CM | POA: Diagnosis not present

## 2020-12-15 DIAGNOSIS — D500 Iron deficiency anemia secondary to blood loss (chronic): Secondary | ICD-10-CM | POA: Diagnosis not present

## 2020-12-15 DIAGNOSIS — R5381 Other malaise: Secondary | ICD-10-CM | POA: Diagnosis not present

## 2020-12-15 DIAGNOSIS — I63342 Cerebral infarction due to thrombosis of left cerebellar artery: Secondary | ICD-10-CM | POA: Diagnosis not present

## 2020-12-15 DIAGNOSIS — K5901 Slow transit constipation: Secondary | ICD-10-CM | POA: Diagnosis not present

## 2020-12-15 DIAGNOSIS — E538 Deficiency of other specified B group vitamins: Secondary | ICD-10-CM | POA: Diagnosis not present

## 2020-12-15 DIAGNOSIS — J029 Acute pharyngitis, unspecified: Secondary | ICD-10-CM | POA: Diagnosis not present

## 2020-12-15 DIAGNOSIS — R2681 Unsteadiness on feet: Secondary | ICD-10-CM | POA: Diagnosis not present

## 2020-12-15 DIAGNOSIS — R21 Rash and other nonspecific skin eruption: Secondary | ICD-10-CM | POA: Diagnosis not present

## 2020-12-15 DIAGNOSIS — R12 Heartburn: Secondary | ICD-10-CM | POA: Diagnosis not present

## 2020-12-15 DIAGNOSIS — K5792 Diverticulitis of intestine, part unspecified, without perforation or abscess without bleeding: Secondary | ICD-10-CM | POA: Diagnosis not present

## 2020-12-15 DIAGNOSIS — M2550 Pain in unspecified joint: Secondary | ICD-10-CM | POA: Diagnosis not present

## 2020-12-15 DIAGNOSIS — N3941 Urge incontinence: Secondary | ICD-10-CM | POA: Diagnosis not present

## 2020-12-15 MED ORDER — POLYETHYLENE GLYCOL 3350 17 G PO PACK: 17.0000 g | PACK | Freq: Every day | ORAL | 0 refills | Status: DC

## 2020-12-15 MED ORDER — NYSTATIN 100000 UNIT/GM EX CREA: TOPICAL_CREAM | Freq: Two times a day (BID) | CUTANEOUS | 0 refills | Status: DC

## 2020-12-15 MED ORDER — ENSURE ENLIVE PO LIQD: 237.0000 mL | Freq: Three times a day (TID) | ORAL | 12 refills | Status: DC

## 2020-12-15 MED ORDER — PROSOURCE PLUS PO LIQD: 30.0000 mL | Freq: Every day | ORAL | Status: DC

## 2020-12-15 MED ORDER — DOCUSATE SODIUM 100 MG PO CAPS: 100.0000 mg | ORAL_CAPSULE | Freq: Two times a day (BID) | ORAL | 0 refills | Status: DC

## 2020-12-15 MED ORDER — ACETAMINOPHEN 325 MG PO TABS: 650.0000 mg | ORAL_TABLET | Freq: Four times a day (QID) | ORAL | Status: DC | PRN

## 2020-12-15 NOTE — Progress Notes (Signed)
Pt to be discharged to [facility] this afternoon. Report called to this facility Raliegh Scarlet RN accepting report, VSS and no acute changes in Pt's assessment at time of discharge

## 2020-12-15 NOTE — TOC Transition Note (Addendum)
Transition of Care [facility]) - CM/SW Discharge Note   Patient Details  Name: Sara Logan MRN: [id number] Date of Birth: 17-Oct-1932  Transition of Care [facility]) CM/SW Contact:  Ross Ludwig, LCSW Phone Number: 12/15/2020, 12:04 PM   Clinical Narrative:     Patient to be d/c'ed today to [facility].  Patient and family agreeable to plans will transport via ems RN to call report to [phone] or [phone] ext [phone]  [address].  CSW updated patient's daughter, that she will be discharging today.  Final next level of care: Skilled Nursing Facility Barriers to Discharge: Barriers Resolved   Patient Goals and CMS Choice Patient states their goals for this hospitalization and ongoing recovery are:: To go to SNF for rehab, then return back to her ALF. CMS Medicare.gov Compare Post Acute Care list provided to:: Patient Choice offered to / list presented to : Patient  Discharge Placement PASRR number recieved: 12/13/20            Patient chooses bed at: [place] Patient to be transferred to facility by: [place] Name of family member notified: Daughter [name] Patient and family notified of of transfer: 12/15/20  Discharge Plan and Services   Discharge Planning Services: CM Consult Post Acute Care Choice: Home Health                               Social Determinants of Health (SDOH) Interventions     Readmission Risk Interventions No flowsheet data found.

## 2020-12-15 NOTE — Progress Notes (Signed)
Physical Therapy Treatment Patient Details Name: Sara Logan MRN: [id number] DOB: 07-15-1933 Today's Date: 12/15/2020    History of Present Illness Patient is an 87 year old female with a past medical history significant for HLD, thrombocytopenia, depression, arthritis, stroke, diverticulitis with abscess formation in January 2022. Admitted 12/02/20 with abdominal pain.  S/P EXPLORATORY LAPAROTOMY; HARTMAN'S PROCEDURE; SPLEENIC FLEXURE MOBILIZATION  COLOSTOMY    PT Comments    Assisted OOB to amb to bathroom and back to bed.  General bed mobility comments: required Min Assist with upper body to transition to EOB and Mod  Assist B LE support up back to bed.  General transfer comment: 50% VC's on proper hand placement and safety with turns from elevated bed to [facility] then back to bed.  General Gait Details: assisted with amb to and from bathroom.    Follow Up Recommendations  SNF     Equipment Recommendations  None recommended by PT    Recommendations for Other Services       Precautions / Restrictions Precautions Precautions: Fall Precaution Comments: colostomy, urinary incontinence Restrictions Weight Bearing Restrictions: No    Mobility  Bed Mobility Overal bed mobility: Needs Assistance Bed Mobility: Supine to Sit;Sit to Supine     Supine to sit: Min assist Sit to supine: Mod assist   General bed mobility comments: required Min Assist with upper body to transition to EOB and Mod  Assist B LE support up back to bed    Transfers Overall transfer level: Needs assistance Equipment used: None Transfers: Sit to/from [facility] Sit to Stand: Min assist Stand pivot transfers: Min assist       General transfer comment: 50% VC's on proper hand placement and safety with turns from elevated bed to [facility] then back to bed  Ambulation/Gait Ambulation/Gait assistance: Min guard Gait Distance (Feet): 20 Feet Assistive device: Rolling walker (2 wheeled) Gait  Pattern/deviations: Step-through pattern;Decreased stride length Gait velocity: decr   General Gait Details: assisted with amb to and from bathroom   Stairs             Wheelchair Mobility    Modified Rankin (Stroke Patients Only)       Balance                                            Cognition Arousal/Alertness: Awake/alert Behavior During Therapy: WFL for tasks assessed/performed Overall Cognitive Status: Within Functional Limits for tasks assessed                                 General Comments: AxO x 3 very sweet and cooperative      Exercises      General Comments        Pertinent Vitals/Pain Pain Assessment: Faces Faces Pain Scale: Hurts a little bit Pain Location: Abdominal incision Pain Descriptors / Indicators: Grimacing;Operative site guarding;Tender Pain Intervention(s): Monitored during session;Repositioned    Home Living                      Prior Function            PT Goals (current goals can now be found in the care plan section) Progress towards PT goals: Progressing toward goals    Frequency    Min 2X/week  PT Plan Current plan remains appropriate;Frequency needs to be updated    Co-evaluation              AM-PAC PT "6 Clicks" Mobility   Outcome Measure  Help needed turning from your back to your side while in a flat bed without using bedrails?: A Little Help needed moving from lying on your back to sitting on the side of a flat bed without using bedrails?: A Little Help needed moving to and from a bed to a chair (including a wheelchair)?: A Little Help needed standing up from a chair using your arms (e.g., wheelchair or bedside chair)?: A Little Help needed to walk in hospital room?: A Little Help needed climbing 3-5 steps with a railing? : A Lot 6 Click Score: 17    End of Session Equipment Utilized During Treatment: Gait belt Activity Tolerance: Patient limited  by fatigue Patient left: in bed;with call bell/phone within reach;with bed alarm set Nurse Communication: Mobility status PT Visit Diagnosis: Difficulty in walking, not elsewhere classified (R26.2);Unsteadiness on feet (R26.81)     Time: 9499-7182 PT Time Calculation (min) (ACUTE ONLY): 27 min  Charges:  $Gait Training: 8-22 mins $Therapeutic Activity: 8-22 mins                     Rica Koyanagi  PTA Acute  Rehabilitation Services Pager      [phone] Office      [phone]

## 2020-12-16 ENCOUNTER — Encounter: Payer: Self-pay | Admitting: Nurse Practitioner

## 2020-12-16 ENCOUNTER — Non-Acute Institutional Stay (SKILLED_NURSING_FACILITY): Payer: Medicare Other | Admitting: Nurse Practitioner

## 2020-12-16 DIAGNOSIS — I10 Essential (primary) hypertension: Secondary | ICD-10-CM | POA: Diagnosis not present

## 2020-12-16 DIAGNOSIS — N3941 Urge incontinence: Secondary | ICD-10-CM

## 2020-12-16 DIAGNOSIS — E538 Deficiency of other specified B group vitamins: Secondary | ICD-10-CM | POA: Insufficient documentation

## 2020-12-16 DIAGNOSIS — K5901 Slow transit constipation: Secondary | ICD-10-CM | POA: Insufficient documentation

## 2020-12-16 DIAGNOSIS — E876 Hypokalemia: Secondary | ICD-10-CM | POA: Diagnosis not present

## 2020-12-16 DIAGNOSIS — D500 Iron deficiency anemia secondary to blood loss (chronic): Secondary | ICD-10-CM

## 2020-12-16 DIAGNOSIS — F339 Major depressive disorder, recurrent, unspecified: Secondary | ICD-10-CM | POA: Diagnosis not present

## 2020-12-16 DIAGNOSIS — E785 Hyperlipidemia, unspecified: Secondary | ICD-10-CM | POA: Diagnosis not present

## 2020-12-16 DIAGNOSIS — I63342 Cerebral infarction due to thrombosis of left cerebellar artery: Secondary | ICD-10-CM | POA: Diagnosis not present

## 2020-12-16 DIAGNOSIS — Z933 Colostomy status: Secondary | ICD-10-CM | POA: Diagnosis not present

## 2020-12-16 NOTE — Assessment & Plan Note (Signed)
S/p TPA, no residual deficit.

## 2020-12-16 NOTE — Assessment & Plan Note (Signed)
Hypokalemia/hypmagnesemia, will repeat K and Mg level.

## 2020-12-16 NOTE — Assessment & Plan Note (Signed)
HTN takes Amlodipine Bun/creat 13/0.45 12/14/20

## 2020-12-16 NOTE — Progress Notes (Signed)
Location:   S[facility] [place] Room Number: 40 [id number] of Service:  SNF (31) Provider: [facility] Sophiah Rolin NP  [place]chette, Alinda Sierras, MD  Patient Care Team: Eulas Post, MD as PCP - General Viona Gilmore, [facility] as Pharmacist (Pharmacist)  Extended Emergency Contact Information Primary Emergency Contact: [employer] Phone: [phone] Relation: Daughter  Code Status: DNR Goals of care: Advanced Directive information Advanced Directives 12/04/2020  Does Patient Have a Medical Advance Directive? No  Type of Advance Directive -  Does patient want to make changes to medical advance directive? -  Copy of [employer] in Chart? -  Would patient like information on creating a medical advance directive? No - Patient declined     Chief Complaint  Patient presents with  . Acute Visit    Medication review    HPI:  Pt is a 87 y.o. female seen today for an acute visit for medication review following hospital stay.   Hospitalized 12/02/20-12/15/20 for diverticulitis with distal sigmoid obstruction. Presentation is LLQ abd pain. Had Hartman's procedure, splenic flexure mobilization and end colostomy. Completed 5 days Zosyn, TPN.   HTN takes Amlodipine Bun/creat 13/0.45 12/14/20   Hypokalemia/hypmagnesemia, will repeat K and Mg level.   Vit B12 deficiency, takes Vit B  Hx of CVA w/o residual deficit  Hyperlipidemia, takes Rosuvastatin  Depression, takes Venlafaxine 49m qd  Constipation, takes MiraLax Colace  Post op anemia, takes Fe Hgb 7.2 12/14/20   Past Medical History:  Diagnosis Date  . Arthritis   . Depression   . Hyperlipidemia   . Osteopenia   . [place]   . [place]   . Vitamin D deficiency   . Wrist fracture 2002   left   Past Surgical History:  Procedure Laterality Date  . APPENDECTOMY  1967  . CARPAL TUNNEL RELEASE  2008   right  . COLON RESECTION SIGMOID N/A 12/03/2020   Procedure: EXPLORATORY  LAPAROTOMY; HARTMAN'S PROCEDURE; SPLEENIC FLEXURE MOBILIZATION;  Surgeon: TLeighton Ruff MD;  Location: [facility];  Service: General;  Laterality: N/A;  . COLOSTOMY N/A 12/03/2020   Procedure: COLOSTOMY;  Surgeon: TLeighton Ruff MD;  Location: [facility];  Service: General;  Laterality: N/A;  . JOINT REPLACEMENT  2002   right total     Allergies  Allergen Reactions  . Bactrim [Sulfamethoxazole-Trimethoprim] Nausea Only  . Ditropan [Oxybutynin]     Made patient feel bad  . Sulfa Antibiotics     Other reaction(s): stomach upset    Allergies as of 12/16/2020      Reactions   Bactrim [sulfamethoxazole-trimethoprim] Nausea Only   Ditropan [oxybutynin]    Made patient feel bad   Sulfa Antibiotics    Other reaction(s): stomach upset      Medication List       Accurate as of December 16, 2020 11:59 PM. If you have any questions, ask your nurse or doctor.        (feeding supplement) PROSource Plus liquid Take 30 mLs by mouth daily. What changed: Another medication with the same name was removed. Continue taking this medication, and follow the directions you see here. Changed by: Gwendalyn Mcgonagle X Dhani Imel, NP   acetaminophen 325 MG tablet Commonly known as: TYLENOL Take 2 tablets (650 mg total) by mouth every 6 (six) hours as needed for mild pain or moderate pain.   alum & mag hydroxide-simeth 200-200-20 MG/5ML suspension Commonly known as: MAALOX/MYLANTA Take 30 mLs by mouth every 4 (four)  hours as needed for indigestion or heartburn.   amLODipine 5 MG tablet Commonly known as: NORVASC TAKE 1 TABLET ONCE DAILY.   aspirin 325 MG EC tablet Take 1 tablet (325 mg total) by mouth daily.   Biotin 10 MG Tabs Take 10 mg by mouth daily.   CHELATED MAGNESIUM PO Take 300 mg by mouth daily.   docusate sodium 100 MG capsule Commonly known as: COLACE Take 1 capsule (100 mg total) by mouth 2 (two) times daily.   IRON PO Take 325 mg by mouth daily.   latanoprost 0.005 % ophthalmic solution Commonly  known as: XALATAN Place 1 drop into the left eye at bedtime.   magnesium hydroxide 400 MG/5ML suspension Commonly known as: MILK OF MAGNESIA Take 30 mLs by mouth daily as needed for mild constipation.   nystatin cream Commonly known as: MYCOSTATIN Apply topically 2 (two) times daily.   polyethylene glycol 17 g packet Commonly known as: MIRALAX / GLYCOLAX Take 17 g by mouth daily.   rosuvastatin 10 MG tablet Commonly known as: CRESTOR TAKE 1 TABLET ONCE DAILY.   venlafaxine XR 75 MG 24 hr capsule Commonly known as: EFFEXOR-XR TAKE 2 CAPSULES (150MG) BY MOUTH DAILY.   vitamin B-12 1000 MCG tablet Commonly known as: CYANOCOBALAMIN Take 1,000 mcg by mouth daily.   vitamin C 500 MG tablet Commonly known as: ASCORBIC ACID Take 500 mg by mouth daily.   Vitamin D3 50 MCG (2000 UT) capsule Take 2,000 Units by mouth daily.   ZINC-220 PO Take 220 mg by mouth daily.       Review of Systems  Constitutional: Positive for activity change, appetite change and fever. Negative for chills and diaphoresis.  HENT: Negative for congestion, hearing loss, trouble swallowing and voice change.   Eyes: Negative for visual disturbance.  Respiratory: Negative for cough, shortness of breath and wheezing.   Cardiovascular: Negative for chest pain and palpitations.  Gastrointestinal: Negative for abdominal distention, abdominal pain, blood in stool, constipation, diarrhea, nausea and vomiting.  Genitourinary: Positive for frequency. Negative for dysuria and urgency.  Musculoskeletal: Positive for arthralgias and gait problem.  Skin: Positive for pallor.  Neurological: Negative for speech difficulty, weakness, light-headedness and headaches.  Psychiatric/Behavioral: Positive for sleep disturbance. Negative for behavioral problems, confusion and hallucinations. The patient is not nervous/anxious.        Did not sleep well last night.     Immunization History  Administered Date(s) Administered   . Fluad Quad(high Dose 65+) 06/13/2019  . Influenza Split 06/18/2012  . Influenza Whole 07/11/2010  . Influenza, High Dose Seasonal PF 07/07/2013, 07/14/2014, 06/24/2015, 06/13/2016, 06/04/2017, 06/17/2018  . Influenza-Unspecified 06/22/2020  . Moderna Sars-Covid-2 Vaccination 09/14/2019, 10/12/2019  . Pneumococcal Conjugate-13 04/27/2014  . Pneumococcal Polysaccharide-23 10/15/2017  . Td 09/10/2005  . Zoster 11/29/2006   Pertinent  Health Maintenance Due  Topic Date Due  . INFLUENZA VACCINE  04/10/2021  . DEXA SCAN  Completed  . PNA vac Low Risk Adult  Completed   Fall Risk  08/31/2020 10/16/2019 08/04/2019 07/31/2018 06/17/2018  Falls in the past year? 1 0 0 0 No  Comment - - Emmi Telephone Survey: data to providers prior to load Emmi Telephone Survey: data to providers prior to load -  Number falls in past yr: 1 - - - -  Injury with Fall? 1 - - - -  Comment September 2021 head injury minor - - - -  Risk for fall due to : - Medication side effect - - -  Risk for fall due to: Comment - - - - -  Follow up - Falls evaluation completed;Education provided;Falls prevention discussed - - -   Functional Status Survey:    Vitals:   12/16/20 1558  BP: 108/64  Pulse: 88  Resp: 20  Temp: 97.7 F (36.5 C)  SpO2: 97%  Weight: 140 lb 12.8 oz (63.9 kg)  Height: 5' 3"  (1.6 m)   Body mass index is 24.94 kg/m. Physical Exam Vitals and nursing note reviewed.  Constitutional:      General: She is not in acute distress.    Appearance: Normal appearance. She is not ill-appearing, toxic-appearing or diaphoretic.  HENT:     Head: Normocephalic and atraumatic.     Nose: Nose normal.     Mouth/Throat:     Mouth: Mucous membranes are moist.  Cardiovascular:     Rate and Rhythm: Normal rate and regular rhythm.     Heart sounds: No murmur heard.   Pulmonary:     Effort: Pulmonary effort is normal.     Breath sounds: No wheezing, rhonchi or rales.  Abdominal:     General: Bowel sounds  are normal. There is no distension.     Palpations: Abdomen is soft.     Tenderness: There is no abdominal tenderness. There is no right CVA tenderness, left CVA tenderness, guarding or rebound.     Comments: Colostomy  Musculoskeletal:     Cervical back: Normal range of motion and neck supple.     Right lower leg: Edema present.     Left lower leg: Edema present.     Comments: Trace edema in ankles.   Skin:    General: Skin is warm and dry.     Coloration: Skin is pale.     Comments: Mid abd surgical scar. Colostomy is functioning.   Neurological:     General: No focal deficit present.     Mental Status: She is alert and oriented to person, place, and time. Mental status is at baseline.     Motor: No weakness.     Coordination: Coordination normal.     Gait: Gait abnormal.  Psychiatric:        Mood and Affect: Mood normal.        Behavior: Behavior normal.        Thought Content: Thought content normal.        Judgment: Judgment normal.     Labs reviewed: Recent Labs    12/12/20 0325 12/13/20 0430 12/14/20 0410  NA 138 136 141  K 4.5 3.8 3.6  CL 109 103 105  CO2 25 26 26   GLUCOSE 122* 134* 116*  BUN 6* 9 13  CREATININE 0.34* 0.42* 0.45  CALCIUM 8.4* 8.7* 8.9  MG 2.2 1.8 1.7  PHOS 3.4 4.2 4.3   Recent Labs    12/10/20 0552 12/11/20 0405 12/12/20 0325  AST 16 17 17   ALT 12 12 12   ALKPHOS 42 41 45  BILITOT 0.6 0.5 0.3  PROT 5.2* 5.2* 5.3*  ALBUMIN 2.2* 2.2* 2.2*   Recent Labs    12/02/20 1143 12/03/20 0207 12/09/20 0446 12/12/20 0325 12/14/20 0410  WBC 11.1*   < > 6.9 7.9 8.8  NEUTROABS 9.3*  --  5.1 6.0  --   HGB 12.7   < > 8.1* 7.4* 7.2*  HCT 40.0   < > 25.5* 24.3* 23.6*  MCV 87.9   < > 88.2 93.1 94.0  PLT 208   < > 172  144* 170   < > = values in this interval not displayed.   Lab Results  Component Value Date   TSH 1.93 04/26/2020   Lab Results  Component Value Date   HGBA1C 5.9 (H) 09/26/2015   Lab Results  Component Value Date   CHOL  136 02/10/2020   HDL 42.60 02/10/2020   LDLCALC 72 02/10/2020   LDLDIRECT 147.0 11/17/2012   TRIG 153 (H) 12/12/2020   CHOLHDL 3 02/10/2020    Significant Diagnostic Results in last 30 days:  CT ABDOMEN PELVIS W CONTRAST  Result Date: 12/02/2020 CLINICAL DATA:  Left lower quadrant abdominal pain. EXAM: CT ABDOMEN AND PELVIS WITH CONTRAST TECHNIQUE: Multidetector CT imaging of the abdomen and pelvis was performed using the standard protocol following bolus administration of intravenous contrast. CONTRAST:  139m OMNIPAQUE IOHEXOL 300 MG/ML  SOLN COMPARISON:  September 22, 2020. FINDINGS: Lower chest: No acute abnormality. Hepatobiliary: No focal liver abnormality is seen. No gallstones, gallbladder wall thickening, or biliary dilatation. Pancreas: Unremarkable. No pancreatic ductal dilatation or surrounding inflammatory changes. Spleen: Normal in size without focal abnormality. Adrenals/Urinary Tract: Adrenal glands are unremarkable. Kidneys are normal, without renal calculi, focal lesion, or hydronephrosis. Bladder is unremarkable. Stomach/Bowel: The stomach appears normal. There is severe dilatation of the large bowel as well as the distal small bowel, concerning for distal colonic obstruction. Severe wall thickening is seen involving the distal sigmoid colon consistent with diverticulitis. Wall thickening of the splenic flexure and descending colon is also noted with a possible suggestion of intramural gas suggesting possible ischemic bowel. There is noted free air around the descending colon concerning for bowel perforation. Vascular/Lymphatic: Aortic atherosclerosis. No enlarged abdominal or pelvic lymph nodes. Reproductive: Uterus and bilateral adnexa are unremarkable. Other: No abdominal wall hernia or abnormality. Small amount of free fluid is noted around the liver. Musculoskeletal: No acute or significant osseous findings. IMPRESSION: 1. Severe wall thickening is seen involving the distal sigmoid  colon consistent with diverticulitis. There is noted free air around the descending colon concerning for bowel perforation. Wall thickening of the splenic flexure and descending colon is also noted with possible intramural gas suggesting possible ischemic bowel. Severe dilatation of the large bowel as well as the distal small bowel is noted, concerning for distal colonic obstruction. Critical Value/emergent results were called by telephone at the time of interpretation on 12/02/2020 at 3:56 pm to provider KBenedetto Goad who verbally acknowledged these results. 2. Aortic atherosclerosis. Aortic Atherosclerosis (ICD10-I70.0). Electronically Signed   By: JMarijo ConceptionM.D.   On: 12/02/2020 15:58   DG CHEST PORT 1 VIEW  Result Date: 12/13/2020 CLINICAL DATA:  Abdominal pain. EXAM: PORTABLE CHEST 1 VIEW COMPARISON:  December 03, 2020. FINDINGS: The heart size and mediastinal contours are within normal limits. Both lungs are clear. No pneumothorax or pleural effusion is noted. Right-sided PICC line is noted with tip in expected position of cavoatrial junction. The visualized skeletal structures are unremarkable. IMPRESSION: No active disease. Electronically Signed   By: JMarijo ConceptionM.D.   On: 12/13/2020 16:26   DG Chest Port 1 View  Result Date: 12/03/2020 CLINICAL DATA:  Preop.  Plan for colon resection EXAM: PORTABLE CHEST 1 VIEW COMPARISON:  Abdominal CT from earlier today FINDINGS: Enteric tube which loops in the stomach, tip at the fundus. Normal heart size and stable aortic tortuosity. Minimal interstitial coarsening at the bases attributed atelectasis. There is no edema, consolidation, effusion, or pneumothorax. IMPRESSION: Minimal atelectasis. Electronically Signed   By: JAngelica Chessman  Watts M.D.   On: 12/03/2020 05:00   DG Abd Portable 1V  Result Date: 12/02/2020 CLINICAL DATA:  Check gastric catheter placement EXAM: PORTABLE ABDOMEN - 1 VIEW COMPARISON:  CT from earlier in the same day. FINDINGS: Gastric  catheter is noted coiled within the stomach. Scattered large and small bowel gas is noted. Persistent gaseous distension of the colon is noted similar to that seen on prior CT. IMPRESSION: Gastric catheter within the stomach. Electronically Signed   By: Inez Catalina M.D.   On: 12/02/2020 19:37   DG Abd Portable 2V  Result Date: 12/13/2020 CLINICAL DATA:  Abdominal pain. EXAM: PORTABLE ABDOMEN - 2 VIEW COMPARISON:  December 02, 2020. FINDINGS: The bowel gas pattern is normal. There is no evidence of free air. No radio-opaque calculi or other significant radiographic abnormality is seen. IMPRESSION: Negative. Electronically Signed   By: Marijo Conception M.D.   On: 12/13/2020 16:25   [place] EKG SITE RITE  Result Date: 12/08/2020 If Site Rite image not attached, placement could not be confirmed due to current cardiac rhythm.   Assessment/Plan: Colostomy status ([place]) Hospitalized 12/02/20-12/15/20 for diverticulitis with distal sigmoid obstruction. Presentation is LLQ abd pain. Had Hartman's procedure, splenic flexure mobilizatin and end colostomy. Completed 5 days Zosyn, TPN.    Essential hypertension HTN takes Amlodipine Bun/creat 13/0.45 12/14/20    Hypokalemia Hypokalemia/hypmagnesemia, will repeat K and Mg level.  Hypomagnesemia Hypokalemia/hypmagnesemia, will repeat K and Mg level.  Vitamin B12 deficiency Takes Vit B12, update Vit B12 level.   Cerebral infarction ([place]) S/p TPA, no residual deficit.   Hyperlipidemia LDL goal <70 Take Rosuvastatin, update lipid panel.   Depression, recurrent ([place]) Her mood is stable, continue Vemlafaxine.   Slow transit constipation Stable, continue MiraLax, colace.   Blood loss anemia takes Fe Hgb 7.2 12/14/20, repeat CBC/diff.   Urine incontinence Long standing issue, did not want urology evaluation or medication for treatment,    Family/ staff Communication: plan of care reviewed with the patient and charge nurse.   Labs/tests ordered:  CBC/diff,  CMP/eGFR, lipid panel, TSH, Mg  Time spend 35 minutes.

## 2020-12-16 NOTE — Assessment & Plan Note (Signed)
Takes Vit B12, update Vit B12 level.

## 2020-12-16 NOTE — Assessment & Plan Note (Signed)
Stable, continue MiraLax, colace.

## 2020-12-16 NOTE — Assessment & Plan Note (Signed)
Hospitalized 12/02/20-12/15/20 for diverticulitis with distal sigmoid obstruction. Presentation is LLQ abd pain. Had Hartman's procedure, splenic flexure mobilizatin and end colostomy. Completed 5 days Zosyn, TPN.

## 2020-12-16 NOTE — Assessment & Plan Note (Signed)
Take Rosuvastatin, update lipid panel.

## 2020-12-16 NOTE — Assessment & Plan Note (Signed)
Her mood is stable, continue Vemlafaxine.

## 2020-12-16 NOTE — Assessment & Plan Note (Signed)
takes Fe Hgb 7.2 12/14/20, repeat CBC/diff.

## 2020-12-19 ENCOUNTER — Encounter: Payer: Self-pay | Admitting: Nurse Practitioner

## 2020-12-19 ENCOUNTER — Telehealth: Payer: Self-pay | Admitting: Family Medicine

## 2020-12-19 NOTE — Telephone Encounter (Signed)
Tried calling patient to  schedule Medicare Annual Wellness Visit (AWV) either virtually or in office.  No answer   Last AWV 10/16/19  please schedule at anytime with [facility] Nurse Health Advisor 1 or 2   This should be a 45 minute visit.

## 2020-12-20 ENCOUNTER — Non-Acute Institutional Stay (SKILLED_NURSING_FACILITY): Payer: Medicare Other | Admitting: Internal Medicine

## 2020-12-20 ENCOUNTER — Encounter: Payer: Self-pay | Admitting: Internal Medicine

## 2020-12-20 DIAGNOSIS — I63342 Cerebral infarction due to thrombosis of left cerebellar artery: Secondary | ICD-10-CM

## 2020-12-20 DIAGNOSIS — F339 Major depressive disorder, recurrent, unspecified: Secondary | ICD-10-CM | POA: Diagnosis not present

## 2020-12-20 DIAGNOSIS — E785 Hyperlipidemia, unspecified: Secondary | ICD-10-CM | POA: Diagnosis not present

## 2020-12-20 DIAGNOSIS — I10 Essential (primary) hypertension: Secondary | ICD-10-CM

## 2020-12-20 DIAGNOSIS — Z933 Colostomy status: Secondary | ICD-10-CM | POA: Diagnosis not present

## 2020-12-20 DIAGNOSIS — N3941 Urge incontinence: Secondary | ICD-10-CM

## 2020-12-20 DIAGNOSIS — D500 Iron deficiency anemia secondary to blood loss (chronic): Secondary | ICD-10-CM

## 2020-12-20 NOTE — Progress Notes (Signed)
Provider:  Veleta Miners MD Location:   [place] Room Number: 2 Place of Service:  SNF (31)  PCP: Eulas Post, MD Patient Care Team: Eulas Post, MD as PCP - General Viona Gilmore, [facility] as Pharmacist (Pharmacist)  Extended Emergency Contact Information Primary Emergency Contact: [place] [employer] Phone: [phone] Relation: Daughter  Code Status: Full Code Goals of Care: Advanced Directive information Advanced Directives 12/04/2020  Does Patient Have a Medical Advance Directive? No  Type of Advance Directive -  Does patient want to make changes to medical advance directive? -  Copy of [place] in Chart? -  Would patient like information on creating a medical advance directive? No - Patient declined      Chief Complaint  Patient presents with  . New Admit To SNF    Admission to SNF    HPI: Patient is a 87 y.o. female seen today for admission to SNF for therapy  Admitted in the hospital from 3/25-4/7 for diverticulitis with distal sigmoid obstruction Underwent exploratory laparotomy with Hartmann procedure and end colostomy  Patient has a history of hypertension, hyperlipidemia, depression, history of CVA  She was recently diagnosed with diverticulitis by her PCP and was treated with Flagyl and Cipro.  With then she had acute onset of left lower quadrant abdominal pain.  Was sent to ED where she was found to have a large bowel obstruction secondary to diverticulitis with free air around the descending colon concerning for bowel perforation Underwent exploratory laparotomy Hartmann procedure and end colostomy. Was treated with 5 days of IV Zosyn Also received TPN for supplemental nutrition Her diet was eventually advanced as tolerated and TPN was weaned off.  Patient is doing well.  Eating.  No nausea or vomiting.  Little worried about her colostomy bag.  Is able to walk with her  walker but has poor endurance.  Also complains of some dizziness when she walks  Before surgery patient was independent.  Has just moved to assisted living was still driving Her 3 alive children [place] of [place] 1 [place]     Past Medical History:  Diagnosis Date  . Arthritis   . Depression   . Hyperlipidemia   . Osteopenia   . Stroke ([place])   . Thrombocytopenia ([place])   . Vitamin D deficiency   . Wrist fracture 2002   left   Past Surgical History:  Procedure Laterality Date  . APPENDECTOMY  1967  . CARPAL TUNNEL RELEASE  2008   right  . COLON RESECTION SIGMOID N/A 12/03/2020   Procedure: EXPLORATORY LAPAROTOMY; HARTMAN'S PROCEDURE; SPLEENIC FLEXURE MOBILIZATION;  Surgeon: Leighton Ruff, MD;  Location: [facility];  Service: General;  Laterality: N/A;  . COLOSTOMY N/A 12/03/2020   Procedure: COLOSTOMY;  Surgeon: Leighton Ruff, MD;  Location: [facility];  Service: General;  Laterality: N/A;  . JOINT REPLACEMENT  2002   right total     reports that she has never smoked. She has never used smokeless tobacco. She reports that she does not drink alcohol and does not use drugs. Social History   Socioeconomic History  . Marital status: Widowed    Spouse name: Not on file  . Number of children: 4  . Years of education: 16  . Highest education level: Bachelor's degree (e.g., BA, AB, BS)  Occupational History  . Not on file  Tobacco Use  . Smoking status: Never Smoker  .  Smokeless tobacco: Never Used  Vaping Use  . Vaping Use: Never used  Substance and Sexual Activity  . Alcohol use: No  . Drug use: No  . Sexual activity: Not on file  Other Topics Concern  . Not on file  Social History Narrative   Lives in senior apartment at [facility]   widowed   Social Determinants of Health   Financial Resource Strain: Not on file  Food Insecurity: Not on file  Transportation Needs: Not on file  Physical Activity: Not on file  Stress: Not on file  Social  Connections: Not on file  Intimate Partner Violence: Not on file    Functional Status Survey:    Family History  Problem Relation Age of Onset  . Breast cancer Mother   . Breast cancer Sister     Health Maintenance  Topic Date Due  . TETANUS/TDAP  09/11/2015  . COVID-19 Vaccine (3 - Booster for Moderna series) 04/10/2020  . INFLUENZA VACCINE  04/10/2021  . DEXA SCAN  Completed  . PNA vac Low Risk Adult  Completed  . HPV VACCINES  Aged Out    Allergies  Allergen Reactions  . Bactrim [Sulfamethoxazole-Trimethoprim] Nausea Only  . Ditropan [Oxybutynin]     Made patient feel bad  . Sulfa Antibiotics     Other reaction(s): stomach upset    Allergies as of 12/20/2020      Reactions   Bactrim [sulfamethoxazole-trimethoprim] Nausea Only   Ditropan [oxybutynin]    Made patient feel bad   Sulfa Antibiotics    Other reaction(s): stomach upset      Medication List       Accurate as of December 20, 2020 11:29 AM. If you have any questions, ask your nurse or doctor.        STOP taking these medications   nystatin cream Commonly known as: MYCOSTATIN Stopped by: Virgie Dad, MD     TAKE these medications   (feeding supplement) PROSource Plus liquid Take 30 mLs by mouth daily.   acetaminophen 325 MG tablet Commonly known as: TYLENOL Take 2 tablets (650 mg total) by mouth every 6 (six) hours as needed for mild pain or moderate pain.   alum & mag hydroxide-simeth 200-200-20 MG/5ML suspension Commonly known as: MAALOX/MYLANTA Take 30 mLs by mouth every 4 (four) hours as needed for indigestion or heartburn.   amLODipine 5 MG tablet Commonly known as: NORVASC TAKE 1 TABLET ONCE DAILY.   aspirin 325 MG EC tablet Take 1 tablet (325 mg total) by mouth daily.   Biotin 10 MG Tabs Take 10 mg by mouth daily.   CHELATED MAGNESIUM PO Take 300 mg by mouth daily.   docusate sodium 100 MG capsule Commonly known as: COLACE Take 1 capsule (100 mg total) by mouth 2 (two)  times daily.   IRON PO Take 325 mg by mouth daily.   latanoprost 0.005 % ophthalmic solution Commonly known as: XALATAN Place 1 drop into the left eye at bedtime.   magnesium hydroxide 400 MG/5ML suspension Commonly known as: MILK OF MAGNESIA Take 30 mLs by mouth daily as needed for mild constipation.   polyethylene glycol 17 g packet Commonly known as: MIRALAX / GLYCOLAX Take 17 g by mouth daily.   rosuvastatin 10 MG tablet Commonly known as: CRESTOR TAKE 1 TABLET ONCE DAILY.   venlafaxine XR 75 MG 24 hr capsule Commonly known as: EFFEXOR-XR TAKE 2 CAPSULES (150MG ) BY MOUTH DAILY.   vitamin B-12 1000 MCG tablet Commonly known  as: CYANOCOBALAMIN Take 1,000 mcg by mouth daily.   vitamin C 500 MG tablet Commonly known as: ASCORBIC ACID Take 500 mg by mouth daily.   Vitamin D3 50 MCG (2000 UT) capsule Take 2,000 Units by mouth daily.   ZINC-220 PO Take 220 mg by mouth daily.       Review of Systems  Constitutional: Positive for activity change and appetite change.  HENT: Negative.   Respiratory: Negative.   Cardiovascular: Positive for leg swelling.  Gastrointestinal: Negative.   Genitourinary: Negative.   Musculoskeletal: Negative.   Skin: Negative.   Neurological: Positive for dizziness and weakness.  Psychiatric/Behavioral: Negative.     Vitals:   12/20/20 1047  BP: 132/84  Pulse: 100  Resp: 18  Temp: 98.2 F (36.8 C)  SpO2: 95%  Weight: 140 lb 12.8 oz (63.9 kg)  Height: 5\' 3"  (1.6 m)   Body mass index is 24.94 kg/m. Physical Exam Vitals reviewed.  Constitutional:      Appearance: Normal appearance.  HENT:     Head: Normocephalic.     Nose: Nose normal.     Mouth/Throat:     Mouth: Mucous membranes are moist.     Pharynx: Oropharynx is clear.  Eyes:     Pupils: Pupils are equal, round, and reactive to light.  Cardiovascular:     Rate and Rhythm: Normal rate and regular rhythm.     Pulses: Normal pulses.     Heart sounds: Normal heart  sounds.  Pulmonary:     Effort: Pulmonary effort is normal.     Breath sounds: Normal breath sounds.  Abdominal:     General: Abdomen is flat. Bowel sounds are normal.     Palpations: Abdomen is soft.     Comments: Has Colostomy bag  Musculoskeletal:        General: Swelling present.     Cervical back: Neck supple.  Skin:    General: Skin is warm and dry.  Neurological:     General: No focal deficit present.     Mental Status: She is alert and oriented to person, place, and time.  Psychiatric:        Mood and Affect: Mood normal.        Thought Content: Thought content normal.        Judgment: Judgment normal.     Labs reviewed: Basic Metabolic Panel: Recent Labs    12/12/20 0325 12/13/20 0430 12/14/20 0410  NA 138 136 141  K 4.5 3.8 3.6  CL 109 103 105  CO2 25 26 26   GLUCOSE 122* 134* 116*  BUN 6* 9 13  CREATININE 0.34* 0.42* 0.45  CALCIUM 8.4* 8.7* 8.9  MG 2.2 1.8 1.7  PHOS 3.4 4.2 4.3   Liver Function Tests: Recent Labs    12/10/20 0552 12/11/20 0405 12/12/20 0325  AST 16 17 17   ALT 12 12 12   ALKPHOS 42 41 45  BILITOT 0.6 0.5 0.3  PROT 5.2* 5.2* 5.3*  ALBUMIN 2.2* 2.2* 2.2*   Recent Labs    12/02/20 1143  LIPASE 32   No results for input(s): AMMONIA in the last 8760 hours. CBC: Recent Labs    12/02/20 1143 12/03/20 0207 12/09/20 0446 12/12/20 0325 12/14/20 0410  WBC 11.1*   < > 6.9 7.9 8.8  NEUTROABS 9.3*  --  5.1 6.0  --   HGB 12.7   < > 8.1* 7.4* 7.2*  HCT 40.0   < > 25.5* 24.3* 23.6*  MCV 87.9   < >  88.2 93.1 94.0  PLT 208   < > 172 144* 170   < > = values in this interval not displayed.   Cardiac Enzymes: No results for input(s): CKTOTAL, CKMB, CKMBINDEX, TROPONINI in the last 8760 hours. BNP: Invalid input(s): POCBNP Lab Results  Component Value Date   HGBA1C 5.9 (H) 09/26/2015   Lab Results  Component Value Date   TSH 1.93 04/26/2020   Lab Results  Component Value Date   MEQASTMH96 222 04/22/2015   No results found  for: FOLATE Lab Results  Component Value Date   IRON 22 (L) 04/22/2015   TIBC 402 04/22/2015   FERRITIN 23 04/22/2015    Imaging and Procedures obtained prior to SNF admission: CT ABDOMEN PELVIS W CONTRAST  Result Date: 12/02/2020 CLINICAL DATA:  Left lower quadrant abdominal pain. EXAM: CT ABDOMEN AND PELVIS WITH CONTRAST TECHNIQUE: Multidetector CT imaging of the abdomen and pelvis was performed using the standard protocol following bolus administration of intravenous contrast. CONTRAST:  137mL OMNIPAQUE IOHEXOL 300 MG/ML  SOLN COMPARISON:  September 22, 2020. FINDINGS: Lower chest: No acute abnormality. Hepatobiliary: No focal liver abnormality is seen. No gallstones, gallbladder wall thickening, or biliary dilatation. Pancreas: Unremarkable. No pancreatic ductal dilatation or surrounding inflammatory changes. Spleen: Normal in size without focal abnormality. Adrenals/Urinary Tract: Adrenal glands are unremarkable. Kidneys are normal, without renal calculi, focal lesion, or hydronephrosis. Bladder is unremarkable. Stomach/Bowel: The stomach appears normal. There is severe dilatation of the large bowel as well as the distal small bowel, concerning for distal colonic obstruction. Severe wall thickening is seen involving the distal sigmoid colon consistent with diverticulitis. Wall thickening of the splenic flexure and descending colon is also noted with a possible suggestion of intramural gas suggesting possible ischemic bowel. There is noted free air around the descending colon concerning for bowel perforation. Vascular/Lymphatic: Aortic atherosclerosis. No enlarged abdominal or pelvic lymph nodes. Reproductive: Uterus and bilateral adnexa are unremarkable. Other: No abdominal wall hernia or abnormality. Small amount of free fluid is noted around the liver. Musculoskeletal: No acute or significant osseous findings. IMPRESSION: 1. Severe wall thickening is seen involving the distal sigmoid colon consistent  with diverticulitis. There is noted free air around the descending colon concerning for bowel perforation. Wall thickening of the splenic flexure and descending colon is also noted with possible intramural gas suggesting possible ischemic bowel. Severe dilatation of the large bowel as well as the distal small bowel is noted, concerning for distal colonic obstruction. Critical Value/emergent results were called by telephone at the time of interpretation on 12/02/2020 at 3:56 pm to provider Benedetto Goad, who verbally acknowledged these results. 2. Aortic atherosclerosis. Aortic Atherosclerosis (ICD10-I70.0). Electronically Signed   By: Marijo Conception M.D.   On: 12/02/2020 15:58   DG Chest Port 1 View  Result Date: 12/03/2020 CLINICAL DATA:  Preop.  Plan for colon resection EXAM: PORTABLE CHEST 1 VIEW COMPARISON:  Abdominal CT from earlier today FINDINGS: Enteric tube which loops in the stomach, tip at the fundus. Normal heart size and stable aortic tortuosity. Minimal interstitial coarsening at the bases attributed atelectasis. There is no edema, consolidation, effusion, or pneumothorax. IMPRESSION: Minimal atelectasis. Electronically Signed   By: Monte Fantasia M.D.   On: 12/03/2020 05:00   DG Abd Portable 1V  Result Date: 12/02/2020 CLINICAL DATA:  Check gastric catheter placement EXAM: PORTABLE ABDOMEN - 1 VIEW COMPARISON:  CT from earlier in the same day. FINDINGS: Gastric catheter is noted coiled within the stomach. Scattered large and small  bowel gas is noted. Persistent gaseous distension of the colon is noted similar to that seen on prior CT. IMPRESSION: Gastric catheter within the stomach. Electronically Signed   By: Inez Catalina M.D.   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

## 2020-12-20 NOTE — Assessment & Plan Note (Signed)
Long standing issue, did not want urology evaluation or medication for treatment,

## 2020-12-21 ENCOUNTER — Non-Acute Institutional Stay (SKILLED_NURSING_FACILITY): Payer: Medicare Other | Admitting: Nurse Practitioner

## 2020-12-21 DIAGNOSIS — I63342 Cerebral infarction due to thrombosis of left cerebellar artery: Secondary | ICD-10-CM | POA: Diagnosis not present

## 2020-12-21 DIAGNOSIS — F339 Major depressive disorder, recurrent, unspecified: Secondary | ICD-10-CM

## 2020-12-21 DIAGNOSIS — I10 Essential (primary) hypertension: Secondary | ICD-10-CM | POA: Diagnosis not present

## 2020-12-21 DIAGNOSIS — E876 Hypokalemia: Secondary | ICD-10-CM

## 2020-12-21 DIAGNOSIS — E538 Deficiency of other specified B group vitamins: Secondary | ICD-10-CM | POA: Diagnosis not present

## 2020-12-21 DIAGNOSIS — E785 Hyperlipidemia, unspecified: Secondary | ICD-10-CM

## 2020-12-21 DIAGNOSIS — Z933 Colostomy status: Secondary | ICD-10-CM | POA: Diagnosis not present

## 2020-12-21 DIAGNOSIS — K5901 Slow transit constipation: Secondary | ICD-10-CM | POA: Diagnosis not present

## 2020-12-21 DIAGNOSIS — D500 Iron deficiency anemia secondary to blood loss (chronic): Secondary | ICD-10-CM | POA: Diagnosis not present

## 2020-12-21 LAB — CBC AND DIFFERENTIAL
HCT: 26 — AB (ref 36–46)
Hemoglobin: 7.6 — AB (ref 12.0–16.0)
Neutrophils Absolute: 2683
Platelets: 195 (ref 150–399)
WBC: 4.3

## 2020-12-21 LAB — BASIC METABOLIC PANEL
BUN: 10 (ref 4–21)
CO2: 28 — AB (ref 13–22)
Chloride: 103 (ref 99–108)
Creatinine: 0.4 — AB (ref 0.5–1.1)
Glucose: 85
Potassium: 2.7 — AB (ref 3.4–5.3)
Sodium: 141 (ref 137–147)

## 2020-12-21 LAB — LIPID PANEL
Cholesterol: 136 (ref 0–200)
HDL: 93 — AB (ref 35–70)
LDL Cholesterol: 72
LDl/HDL Ratio: 3.2
Triglycerides: 119 (ref 40–160)

## 2020-12-21 LAB — COMPREHENSIVE METABOLIC PANEL
Albumin: 2.9 — AB (ref 3.5–5.0)
Calcium: 8.5 — AB (ref 8.7–10.7)
Globulin: 2.8

## 2020-12-21 LAB — TSH: TSH: 1.4 (ref 0.41–5.90)

## 2020-12-21 LAB — HEPATIC FUNCTION PANEL
ALT: 9 (ref 7–35)
AST: 12 — AB (ref 13–35)
Alkaline Phosphatase: 65 (ref 25–125)
Bilirubin, Total: 0.4

## 2020-12-21 LAB — VITAMIN B12: Vitamin B-12: 942

## 2020-12-21 LAB — CBC: RBC: 2.85 — AB (ref 3.87–5.11)

## 2020-12-21 NOTE — Progress Notes (Signed)
Location:   [facility] [place] Room Number: 87 OMVEH of Service:  SNF (31) Provider: [facility] Rehema Muffley NP  Burchette, Alinda Sierras, MD  Patient Care Team: Eulas Post, MD as PCP - General Viona Gilmore, [facility] as Pharmacist (Pharmacist)  Extended Emergency Contact Information Primary Emergency Contact: [place] [employer] Phone: [phone] Relation: Daughter  Code Status: DNR Goals of care: Advanced Directive information Advanced Directives 12/04/2020  Does Patient Have a Medical Advance Directive? No  Type of Advance Directive -  Does patient want to make changes to medical advance directive? -  Copy of [place] in Chart? -  Would patient like information on creating a medical advance directive? No - Patient declined     Chief Complaint  Patient presents with  . Acute Visit    Low potassium     HPI:  Pt is a 87 y.o. female seen today for an acute visit for hypokalemia, serum K 2.7 12/20/20, the patient stated she has no increased muscle weakness, palpitation, or GI Symptoms.     Hospitalized 12/02/20-12/15/20 for diverticulitis with distal sigmoid obstruction. Presentation is LLQ abd pain. Had Hartman's procedure, splenic flexure mobilization and end colostomy.              HTN takes Amlodipine Bun/creat 10/0.43 12/20/20             Hypmagnesemia, Mg 1.8 12/20/20             Vit B12 deficiency, takes Vit B, Vit B12 942             Hx of CVA w/o residual deficit             Hyperlipidemia, takes Rosuvastatin, LDL 72 12/20/20             Depression, takes Venlafaxine 33m qd             Constipation, takes MiraLax Colace             Post op anemia, takes Fe Hgb 7.2 12/14/20, Hgb 7.6 12/20/20  Past Medical History:  Diagnosis Date  . Arthritis   . Depression   . Hyperlipidemia   . Osteopenia   . Stroke ([place]   . Thrombocytopenia ([place]   . Vitamin D deficiency   . Wrist fracture 2002   left   Past Surgical History:   Procedure Laterality Date  . APPENDECTOMY  1967  . CARPAL TUNNEL RELEASE  2008   right  . COLON RESECTION SIGMOID N/A 12/03/2020   Procedure: EXPLORATORY LAPAROTOMY; HARTMAN'S PROCEDURE; SPLEENIC FLEXURE MOBILIZATION;  Surgeon: TLeighton Ruff MD;  Location: [facility];  Service: General;  Laterality: N/A;  . COLOSTOMY N/A 12/03/2020   Procedure: COLOSTOMY;  Surgeon: TLeighton Ruff MD;  Location: [facility];  Service: General;  Laterality: N/A;  . JOINT REPLACEMENT  2002   right total     Allergies  Allergen Reactions  . Bactrim [Sulfamethoxazole-Trimethoprim] Nausea Only  . Ditropan [Oxybutynin]     Made patient feel bad  . Sulfa Antibiotics     Other reaction(s): stomach upset    Allergies as of 12/21/2020      Reactions   Bactrim [sulfamethoxazole-trimethoprim] Nausea Only   Ditropan [oxybutynin]    Made patient feel bad   Sulfa Antibiotics    Other reaction(s): stomach upset      Medication List       Accurate as of December 21, 2020 11:59 PM. If you have any  questions, ask your nurse or doctor.        (feeding supplement) PROSource Plus liquid Take 30 mLs by mouth daily.   acetaminophen 325 MG tablet Commonly known as: TYLENOL Take 2 tablets (650 mg total) by mouth every 6 (six) hours as needed for mild pain or moderate pain.   alum & mag hydroxide-simeth 200-200-20 MG/5ML suspension Commonly known as: MAALOX/MYLANTA Take 30 mLs by mouth every 4 (four) hours as needed for indigestion or heartburn.   amLODipine 5 MG tablet Commonly known as: NORVASC TAKE 1 TABLET ONCE DAILY. What changed:   how much to take  how to take this  when to take this   aspirin 325 MG EC tablet Take 1 tablet (325 mg total) by mouth daily.   Biotin 10 MG Tabs Take 10 mg by mouth daily.   CHELATED MAGNESIUM PO Take 300 mg by mouth daily.   docusate sodium 100 MG capsule Commonly known as: COLACE Take 1 capsule (100 mg total) by mouth 2 (two) times daily.   IRON PO Take 325 mg  by mouth daily.   latanoprost 0.005 % ophthalmic solution Commonly known as: XALATAN Place 1 drop into the left eye at bedtime.   magnesium hydroxide 400 MG/5ML suspension Commonly known as: MILK OF MAGNESIA Take 30 mLs by mouth daily as needed for mild constipation.   polyethylene glycol 17 g packet Commonly known as: MIRALAX / GLYCOLAX Take 17 g by mouth daily.   rosuvastatin 10 MG tablet Commonly known as: CRESTOR TAKE 1 TABLET ONCE DAILY.   venlafaxine XR 75 MG 24 hr capsule Commonly known as: EFFEXOR-XR TAKE 2 CAPSULES (150MG) BY MOUTH DAILY.   vitamin B-12 1000 MCG tablet Commonly known as: CYANOCOBALAMIN Take 1,000 mcg by mouth daily.   vitamin C 500 MG tablet Commonly known as: ASCORBIC ACID Take 500 mg by mouth daily.   Vitamin D3 50 MCG (2000 UT) capsule Take 2,000 Units by mouth daily.   ZINC-220 PO Take 220 mg by mouth daily.       Review of Systems  Constitutional: Negative for activity change, appetite change and fever.  HENT: Negative for congestion, hearing loss and trouble swallowing.   Eyes: Negative for visual disturbance.  Respiratory: Negative for cough and shortness of breath.   Cardiovascular: Negative for chest pain and palpitations.  Gastrointestinal: Negative for abdominal pain, constipation, nausea and vomiting.  Genitourinary: Positive for frequency. Negative for dysuria and urgency.  Musculoskeletal: Positive for arthralgias and gait problem.  Skin: Positive for pallor.  Neurological: Negative for speech difficulty, weakness and light-headedness.  Psychiatric/Behavioral: Positive for sleep disturbance. Negative for confusion. The patient is not nervous/anxious.        Did not sleep well last night.     Immunization History  Administered Date(s) Administered  . Fluad Quad(high Dose 65+) 06/13/2019  . Influenza Split 06/18/2012  . Influenza Whole 07/11/2010  . Influenza, High Dose Seasonal PF 07/07/2013, 07/14/2014, 06/24/2015,  06/13/2016, 06/04/2017, 06/17/2018  . Influenza-Unspecified 06/22/2020  . Moderna Sars-Covid-2 Vaccination 09/14/2019, 10/12/2019  . Pneumococcal Conjugate-13 04/27/2014  . Pneumococcal Polysaccharide-23 10/15/2017  . Td 09/10/2005  . Zoster 11/29/2006   Pertinent  Health Maintenance Due  Topic Date Due  . INFLUENZA VACCINE  04/10/2021  . DEXA SCAN  Completed  . PNA vac Low Risk Adult  Completed   Fall Risk  08/31/2020 10/16/2019 08/04/2019 07/31/2018 06/17/2018  Falls in the past year? 1 0 0 0 No  Comment - - Emmi Telephone Survey: data  to providers prior to load Emmi Telephone Survey: data to providers prior to load -  Number falls in past yr: 1 - - - -  Injury with Fall? 1 - - - -  Comment September 2021 head injury minor - - - -  Risk for fall due to : - Medication side effect - - -  Risk for fall due to: Comment - - - - -  Follow up - Falls evaluation completed;Education provided;Falls prevention discussed - - -   Functional Status Survey:    Vitals:   12/21/20 1118  BP: (!) 154/76  Pulse: 92  Resp: 20  Temp: 97.7 F (36.5 C)  SpO2: 97%   There is no height or weight on file to calculate BMI. Physical Exam Vitals and nursing note reviewed.  Constitutional:      Appearance: Normal appearance.  HENT:     Head: Normocephalic and atraumatic.     Nose: Nose normal.     Mouth/Throat:     Mouth: Mucous membranes are moist.  Cardiovascular:     Rate and Rhythm: Normal rate and regular rhythm.     Heart sounds: No murmur heard.   Pulmonary:     Effort: Pulmonary effort is normal.     Breath sounds: No rales.  Abdominal:     General: Bowel sounds are normal.     Palpations: Abdomen is soft.     Tenderness: There is no abdominal tenderness. There is no guarding or rebound.     Comments: Colostomy  Musculoskeletal:     Cervical back: Normal range of motion and neck supple.     Right lower leg: Edema present.     Left lower leg: Edema present.     Comments: Trace  edema in ankles.   Skin:    General: Skin is warm and dry.     Coloration: Skin is pale.     Comments: Mid abd surgical scar. Colostomy is functioning.   Neurological:     General: No focal deficit present.     Mental Status: She is alert and oriented to person, place, and time. Mental status is at baseline.     Gait: Gait abnormal.  Psychiatric:        Mood and Affect: Mood normal.        Behavior: Behavior normal.        Thought Content: Thought content normal.        Judgment: Judgment normal.     Labs reviewed: Recent Labs    12/12/20 0325 12/13/20 0430 12/14/20 0410  NA 138 136 141  K 4.5 3.8 3.6  CL 109 103 105  CO2 _0 GLUCOSE 122* 134* 116*  BUN 6* 9 13  CREATININE 0.34* 0.42* 0.45  CALCIUM 8.4* 8.7* 8.9  MG 2.2 1.8 1.7  PHOS 3.4 4.2 4.3   Recent Labs    12/10/20 0552 12/11/20 0405 12/12/20 0325  AST _1 ALT _2 ALKPHOS 42 41 45  BILITOT 0.6 0.5 0.3  PROT 5.2* 5.2* 5.3*  ALBUMIN 2.2* 2.2* 2.2*   Recent Labs    12/02/20 1143 12/03/20 0207 12/09/20 0446 12/12/20 0325 12/14/20 0410  WBC 11.1*   < > 6.9 7.9 8.8  NEUTROABS 9.3*  --  5.1 6.0  --   HGB 12.7   < > 8.1* 7.4* 7.2*  HCT 40.0   < > 25.5* 24.3* 23.6*  MCV 87.9   < > 88.2 93.1  94.0  PLT 208   < > 172 144* 170   < > = values in this interval not displayed.   Lab Results  Component Value Date   TSH 1.93 04/26/2020   Lab Results  Component Value Date   HGBA1C 5.9 (H) 09/26/2015   Lab Results  Component Value Date   CHOL 136 02/10/2020   HDL 42.60 02/10/2020   LDLCALC 72 02/10/2020   LDLDIRECT 147.0 11/17/2012   TRIG 153 (H) 12/12/2020   CHOLHDL 3 02/10/2020    Significant Diagnostic Results in last 30 days:  CT ABDOMEN PELVIS W CONTRAST  Result Date: 12/02/2020 CLINICAL DATA:  Left lower quadrant abdominal pain. EXAM: CT ABDOMEN AND PELVIS WITH CONTRAST TECHNIQUE: Multidetector CT imaging of the abdomen and pelvis was performed using the standard protocol  following bolus administration of intravenous contrast. CONTRAST:  152m OMNIPAQUE IOHEXOL 300 MG/ML  SOLN COMPARISON:  September 22, 2020. FINDINGS: Lower chest: No acute abnormality. Hepatobiliary: No focal liver abnormality is seen. No gallstones, gallbladder wall thickening, or biliary dilatation. Pancreas: Unremarkable. No pancreatic ductal dilatation or surrounding inflammatory changes. Spleen: Normal in size without focal abnormality. Adrenals/Urinary Tract: Adrenal glands are unremarkable. Kidneys are normal, without renal calculi, focal lesion, or hydronephrosis. Bladder is unremarkable. Stomach/Bowel: The stomach appears normal. There is severe dilatation of the large bowel as well as the distal small bowel, concerning for distal colonic obstruction. Severe wall thickening is seen involving the distal sigmoid colon consistent with diverticulitis. Wall thickening of the splenic flexure and descending colon is also noted with a possible suggestion of intramural gas suggesting possible ischemic bowel. There is noted free air around the descending colon concerning for bowel perforation. Vascular/Lymphatic: Aortic atherosclerosis. No enlarged abdominal or pelvic lymph nodes. Reproductive: Uterus and bilateral adnexa are unremarkable. Other: No abdominal wall hernia or abnormality. Small amount of free fluid is noted around the liver. Musculoskeletal: No acute or significant osseous findings. IMPRESSION: 1. Severe wall thickening is seen involving the distal sigmoid colon consistent with diverticulitis. There is noted free air around the descending colon concerning for bowel perforation. Wall thickening of the splenic flexure and descending colon is also noted with possible intramural gas suggesting possible ischemic bowel. Severe dilatation of the large bowel as well as the distal small bowel is noted, concerning for distal colonic obstruction. Critical Value/emergent results were called by telephone at the time  of interpretation on 12/02/2020 at 3:56 pm to provider KBenedetto Goad who verbally acknowledged these results. 2. Aortic atherosclerosis. Aortic Atherosclerosis (ICD10-I70.0). Electronically Signed   By: JMarijo ConceptionM.D.   On: 12/02/2020 15:58   DG CHEST PORT 1 VIEW  Result Date: 12/13/2020 CLINICAL DATA:  Abdominal pain. EXAM: PORTABLE CHEST 1 VIEW COMPARISON:  December 03, 2020. FINDINGS: The heart size and mediastinal contours are within normal limits. Both lungs are clear. No pneumothorax or pleural effusion is noted. Right-sided PICC line is noted with tip in expected position of cavoatrial junction. The visualized skeletal structures are unremarkable. IMPRESSION: No active disease. Electronically Signed   By: JMarijo ConceptionM.D.   On: 12/13/2020 16:26   DG Chest Port 1 View  Result Date: 12/03/2020 CLINICAL DATA:  Preop.  Plan for colon resection EXAM: PORTABLE CHEST 1 VIEW COMPARISON:  Abdominal CT from earlier today FINDINGS: Enteric tube which loops in the stomach, tip at the fundus. Normal heart size and stable aortic tortuosity. Minimal interstitial coarsening at the bases attributed atelectasis. There is no edema, consolidation, effusion, or pneumothorax.  IMPRESSION: Minimal atelectasis. Electronically Signed   By: Monte Fantasia M.D.   On: 12/03/2020 05:00   DG Abd Portable 1V  Result Date: 12/02/2020 CLINICAL DATA:  Check gastric catheter placement EXAM: PORTABLE ABDOMEN - 1 VIEW COMPARISON:  CT from earlier in the same day. FINDINGS: Gastric catheter is noted coiled within the stomach. Scattered large and small bowel gas is noted. Persistent gaseous distension of the colon is noted similar to that seen on prior CT. IMPRESSION: Gastric catheter within the stomach. Electronically Signed   By: Inez Catalina M.D.   On: 12/02/2020 19:37   DG Abd Portable 2V  Result Date: 12/13/2020 CLINICAL DATA:  Abdominal pain. EXAM: PORTABLE ABDOMEN - 2 VIEW COMPARISON:  December 02, 2020. FINDINGS: The bowel  gas pattern is normal. There is no evidence of free air. No radio-opaque calculi or other significant radiographic abnormality is seen. IMPRESSION: Negative. Electronically Signed   By: Marijo Conception M.D.   On: 12/13/2020 16:25   [place] EKG SITE RITE  Result Date: 12/08/2020 If Site Rite image not attached, placement could not be confirmed due to current cardiac rhythm.   Assessment/Plan: Hypokalemia serum K 2.7 12/20/20, the patient stated she has no increased muscle weakness, palpitation, or GI Symptoms. Kcl 55mq bid x3 day then qd, BMP 12/24/20 12/24/20 Na 138, K 3.5, Bun 10, creat 0.42, eGFR 93  [place] [facility]12[facility]20 for diverticulitis with distal sigmoid obstruction. Presentation is LLQ abd pain. Had Hartman's procedure, splenic flexure mobilization and end colostomy.    Essential hypertension  takes Amlodipine Bun/creat 10/0.43 12/20/20   Hypomagnesemia Mg 1.8 12/20/20  Vitamin B12 deficiency takes Vit B, Vit B12 942  Cerebral infarction (HCC) Hx of CVA w/o residual deficit   Hyperlipidemia LDL goal <70 Hyperlipidemia, takes Rosuvastatin, LDL 72 12/20/20   Depression, recurrent (HCC) takes Venlafaxine 124mqd  Slow transit constipation takes MiraLax Colace   Blood loss anemia takes Fe Hgb 7.2 12/14/20, Hgb 7.6 12/20/20, update CBC one week.      Family/ staff Communication: plan of care reviewed with the patient and charge nurse.   Labs/tests ordered:  BMP 12/24/20  Time spend 35 minutes.

## 2020-12-21 NOTE — Assessment & Plan Note (Addendum)
serum K 2.7 12/20/20, the patient stated she has no increased muscle weakness, palpitation, or GI Symptoms. Kcl 47mq bid x3 day then qd, BMP 12/24/20 12/24/20 Na 138, K 3.5, Bun 10, creat 0.42, eGFR 93

## 2020-12-21 NOTE — Assessment & Plan Note (Signed)
Hx of CVA w/o residual deficit 

## 2020-12-21 NOTE — Assessment & Plan Note (Signed)
takes Fe Hgb 7.2 12/14/20, Hgb 7.6 12/20/20, update CBC one week.

## 2020-12-21 NOTE — Assessment & Plan Note (Signed)
takes Amlodipine Bun/creat 10/0.43 12/20/20

## 2020-12-21 NOTE — Assessment & Plan Note (Signed)
takes MiraLax Colace

## 2020-12-21 NOTE — Assessment & Plan Note (Signed)
Hospitalized 12/02/20-12/15/20 for diverticulitis with distal sigmoid obstruction. Presentation is LLQ abd pain. Had Hartman's procedure, splenic flexure mobilization and end colostomy.

## 2020-12-21 NOTE — Assessment & Plan Note (Signed)
takes Venlafaxine 10mg  qd

## 2020-12-21 NOTE — Assessment & Plan Note (Signed)
takes Vit B, Vit B12 942 

## 2020-12-21 NOTE — Assessment & Plan Note (Signed)
Hyperlipidemia, takes Rosuvastatin, LDL 72 12/20/20

## 2020-12-21 NOTE — Assessment & Plan Note (Signed)
Mg 1.8 12/20/20 

## 2020-12-24 LAB — BASIC METABOLIC PANEL
BUN: 10 (ref 4–21)
CO2: 29 — AB (ref 13–22)
Chloride: 103 (ref 99–108)
Creatinine: 0.4 — AB (ref 0.5–1.1)
Glucose: 105
Potassium: 3.5 (ref 3.4–5.3)
Sodium: 138 (ref 137–147)

## 2020-12-24 LAB — COMPREHENSIVE METABOLIC PANEL: Calcium: 9.1 (ref 8.7–10.7)

## 2020-12-26 ENCOUNTER — Encounter: Payer: Self-pay | Admitting: Nurse Practitioner

## 2020-12-29 LAB — CBC AND DIFFERENTIAL
HCT: 33 — AB (ref 36–46)
Hemoglobin: 10 — AB (ref 12.0–16.0)
Platelets: 201 (ref 150–399)
WBC: 5.5

## 2020-12-29 LAB — CBC: RBC: 3.66 — AB (ref 3.87–5.11)

## 2021-01-06 ENCOUNTER — Other Ambulatory Visit: Payer: Self-pay

## 2021-01-06 ENCOUNTER — Ambulatory Visit (HOSPITAL_COMMUNITY)
Admission: RE | Admit: 2021-01-06 | Discharge: 2021-01-06 | Disposition: A | Discharge status: Home / Self Care | Payer: Medicare Other | Source: Ambulatory Visit | Attending: Family Medicine | Admitting: Family Medicine

## 2021-01-06 DIAGNOSIS — Z933 Colostomy status: Secondary | ICD-10-CM

## 2021-01-06 NOTE — Discharge Instructions (Signed)
Continue working with therapy for ostomy education.  Standing in front of mirror will aid you in ostomy care.  Your pouching procedure is as follows:  Remove old pouch and cleanse skin with mild soap and water.   Treat any red skin with stoma powder and barrier ring CUt barrier opening to fit (1 1/2") Snap pouch and barrier together Apply pouch and rolled closed.

## 2021-01-06 NOTE — Consult Note (Incomplete Revision)
[facility]   Reason for visit:  Patient desires to learn ostomy care so she can return to assisted living HPI:  Colectomy with colostomy 3/22 ROS  Review of Systems  Gastrointestinal: Negative.        LLQ colostomy  Psychiatric/Behavioral: The patient is nervous/anxious.   All other systems reviewed and are negative.  Vital signs:  BP (!) 151/76   Pulse 92   Temp 97.8 F (36.6 C)   Resp 18  Exam:  Physical Exam  Stoma type/location:  LLQ colostomy pink patent and producing soft brown stool.  Well budded Stomal assessment/size:  1 1/2" well budded pink and moist Peristomal assessment:  intact Treatment options for stomal/peristomal skin: barrier ring and 2 piece 2 3/4" pouch Output: soft brown stool Ostomy pouching: 2pc. 2 3/4" pouch with barrier ring and stoma powder  Education provided:  Patient is independent with emptying.  She will perform a pouch change with coaching today.  Her barrier was cut too large at last pouch change and exposed skin will be treated with stoma powder and barrier ring today. Ring and powder will be sent with patient.     Impression/dx  Peristomal skin breakdown due to exposure to stool.  Discussion  Patient performed pouch change today with minimal assist.  Difficulty noted with scissors.  She has curved scissors at her facility. OT has been aiding her in ostomy care, she states. She is encouraged to stand in front of her mirror to perform pouch change.  Today, she prefers to sit on edge of bed Patient removes old pouch and cleanses skin.  We discuss the redness and irritation from exposure to stool  We will re-measure and send her with a pattern today.We treat the skin with stoma powder and barrier ring  Patient cuts barrier to fit (1 1/2") and snaps together pouch and barrier.  Backing is removed and pouch applied.  She is able to roll closed.  She states the pointed edges of the pouch dig into her skin some times and she may consider  Coloplast pouches as they are softer.  I will have a coloplast starter kit sent to her.  Plan  Continue working with therapy for ostomy education.  Standing in front of mirror will aid you in ostomy care.  Your pouching procedure is as follows:  Remove old pouch and cleanse skin with mild soap and water.   Treat any red skin with stoma powder and barrier ring CUt barrier opening to fit (1 1/2") Snap pouch and barrier together Apply pouch and rolled closed.   Visit time: 60 minutes.   Graves Nipp FNP-BC    

## 2021-01-06 NOTE — Consult Note (Addendum)
[facility]   Reason for visit:  Patient desires to learn ostomy care so she can return to assisted living HPI:  Colectomy with colostomy 3/22 ROS  Review of Systems  Gastrointestinal: Negative.        LLQ colostomy  Psychiatric/Behavioral: The patient is nervous/anxious.   All other systems reviewed and are negative.  Vital signs:  BP (!) 151/76   Pulse 92   Temp 97.8 F (36.6 C)   Resp 18  Exam:  Physical Exam  Stoma type/location:  LLQ colostomy pink patent and producing soft brown stool.  Well budded Stomal assessment/size:  1 1/2" well budded pink and moist Peristomal assessment:  intact Treatment options for stomal/peristomal skin: barrier ring and 2 piece 2 3/4" pouch Output: soft brown stool Ostomy pouching: 2pc. 2 3/4" pouch with barrier ring and stoma powder  Education provided:  Patient is independent with emptying.  She will perform a pouch change with coaching today.  Her barrier was cut too large at last pouch change and exposed skin will be treated with stoma powder and barrier ring today. Ring and powder will be sent with patient.     Impression/dx  Peristomal skin breakdown due to exposure to stool.  Discussion  Patient performed pouch change today with minimal assist.  Difficulty noted with scissors.  She has curved scissors at her facility. OT has been aiding her in ostomy care, she states. She is encouraged to stand in front of her mirror to perform pouch change.  Today, she prefers to sit on edge of bed Patient removes old pouch and cleanses skin.  We discuss the redness and irritation from exposure to stool  We will re-measure and send her with a pattern today.We treat the skin with stoma powder and barrier ring  Patient cuts barrier to fit (1 1/2") and snaps together pouch and barrier.  Backing is removed and pouch applied.  She is able to roll closed.  She states the pointed edges of the pouch dig into her skin some times and she may consider  Coloplast pouches as they are softer.  I will have a coloplast starter kit sent to her.  Plan  Continue working with therapy for ostomy education.  Standing in front of mirror will aid you in ostomy care.  Your pouching procedure is as follows:  Remove old pouch and cleanse skin with mild soap and water.   Treat any red skin with stoma powder and barrier ring CUt barrier opening to fit (1 1/2") Snap pouch and barrier together Apply pouch and rolled closed.   Visit time: 60 minutes.   Sara Moras FNP-BC

## 2021-01-09 DIAGNOSIS — M6281 Muscle weakness (generalized): Secondary | ICD-10-CM | POA: Diagnosis not present

## 2021-01-09 DIAGNOSIS — R29898 Other symptoms and signs involving the musculoskeletal system: Secondary | ICD-10-CM | POA: Diagnosis not present

## 2021-01-09 DIAGNOSIS — K5792 Diverticulitis of intestine, part unspecified, without perforation or abscess without bleeding: Secondary | ICD-10-CM | POA: Diagnosis not present

## 2021-01-09 DIAGNOSIS — R2681 Unsteadiness on feet: Secondary | ICD-10-CM | POA: Diagnosis not present

## 2021-01-09 DIAGNOSIS — Z933 Colostomy status: Secondary | ICD-10-CM | POA: Diagnosis not present

## 2021-01-10 ENCOUNTER — Non-Acute Institutional Stay (SKILLED_NURSING_FACILITY): Payer: Medicare Other | Admitting: Orthopedic Surgery

## 2021-01-10 ENCOUNTER — Encounter: Payer: Self-pay | Admitting: Orthopedic Surgery

## 2021-01-10 DIAGNOSIS — M6281 Muscle weakness (generalized): Secondary | ICD-10-CM | POA: Diagnosis not present

## 2021-01-10 DIAGNOSIS — J029 Acute pharyngitis, unspecified: Secondary | ICD-10-CM | POA: Diagnosis not present

## 2021-01-10 DIAGNOSIS — R2681 Unsteadiness on feet: Secondary | ICD-10-CM | POA: Diagnosis not present

## 2021-01-10 DIAGNOSIS — J302 Other seasonal allergic rhinitis: Secondary | ICD-10-CM | POA: Diagnosis not present

## 2021-01-10 DIAGNOSIS — K5792 Diverticulitis of intestine, part unspecified, without perforation or abscess without bleeding: Secondary | ICD-10-CM | POA: Diagnosis not present

## 2021-01-10 DIAGNOSIS — Z933 Colostomy status: Secondary | ICD-10-CM | POA: Diagnosis not present

## 2021-01-10 DIAGNOSIS — R29898 Other symptoms and signs involving the musculoskeletal system: Secondary | ICD-10-CM | POA: Diagnosis not present

## 2021-01-10 LAB — MAGNESIUM: Magnesium: 1.8

## 2021-01-10 NOTE — Progress Notes (Signed)
Location:   [place] Room Number: 51 Place of Service:  SNF [phone]) Provider: Windell Moulding, NP  Virgie Dad, MD  Patient Care Team: Virgie Dad, MD as PCP - General (Internal Medicine) Viona Gilmore, [facility] as Pharmacist (Pharmacist)  Extended Emergency Contact Information Primary Emergency Contact: [place] [employer] Phone: [phone] Relation: Daughter  Code Status:  Full Code Goals of care: Advanced Directive information Advanced Directives 12/04/2020  Does Patient Have a Medical Advance Directive? No  Type of Advance Directive -  Does patient want to make changes to medical advance directive? -  Copy of [place] in Chart? -  Would patient like information on creating a medical advance directive? No - Patient declined     Chief Complaint  Patient presents with  . Acute Visit    Sore throat    HPI:  Pt is a 87 y.o. female seen today for an acute visit for sore throat.   Patient reports sore throat began about one week ago. Describes throat as dry. Pain rated 3/10. She is able to eat and drink without swallowing issues. Tylenol and warm fluids help with pain. In addition, she reports a runny nose with clear drainage. She does not have a history of seasonal allergies, but has had a few incidents in her life. Does not take allergy medication at this time.   Nurse does not report any other concerns, vitals stable.    Past Medical History:  Diagnosis Date  . Arthritis   . Depression   . Hyperlipidemia   . Osteopenia   . Stroke ([place])   . Thrombocytopenia ([place])   . Vitamin D deficiency   . Wrist fracture 2002   left   Past Surgical History:  Procedure Laterality Date  . APPENDECTOMY  1967  . CARPAL TUNNEL RELEASE  2008   right  . COLON RESECTION SIGMOID N/A 12/03/2020   Procedure: EXPLORATORY LAPAROTOMY; HARTMAN'S PROCEDURE; SPLEENIC FLEXURE MOBILIZATION;  Surgeon: Leighton Ruff, MD;   Location: [facility];  Service: General;  Laterality: N/A;  . COLOSTOMY N/A 12/03/2020   Procedure: COLOSTOMY;  Surgeon: Leighton Ruff, MD;  Location: [facility];  Service: General;  Laterality: N/A;  . JOINT REPLACEMENT  2002   right total     Allergies  Allergen Reactions  . Bactrim [Sulfamethoxazole-Trimethoprim] Nausea Only  . Ditropan [Oxybutynin]     Made patient feel bad  . Sulfa Antibiotics     Other reaction(s): stomach upset    Allergies as of 01/10/2021      Reactions   Bactrim [sulfamethoxazole-trimethoprim] Nausea Only   Ditropan [oxybutynin]    Made patient feel bad   Sulfa Antibiotics    Other reaction(s): stomach upset      Medication List       Accurate as of Jan 10, 2021 10:35 AM. If you have any questions, ask your nurse or doctor.        (feeding supplement) PROSource Plus liquid Take 30 mLs by mouth daily.   acetaminophen 325 MG tablet Commonly known as: TYLENOL Take 2 tablets (650 mg total) by mouth every 6 (six) hours as needed for mild pain or moderate pain.   alum & mag hydroxide-simeth 200-200-20 MG/5ML suspension Commonly known as: MAALOX/MYLANTA Take 30 mLs by mouth every 4 (four) hours as needed for indigestion or heartburn.   amLODipine 2.5 MG tablet Commonly known as: NORVASC Take 2.5 mg by mouth daily. What changed: Another medication  with the same name was removed. Continue taking this medication, and follow the directions you see here. Changed by: Yvonna Alanis, NP   aspirin 325 MG EC tablet Take 1 tablet (325 mg total) by mouth daily.   Biotin 10 MG Tabs Take 10 mg by mouth daily.   CHELATED MAGNESIUM PO Take 300 mg by mouth daily.   docusate sodium 100 MG capsule Commonly known as: COLACE Take 1 capsule (100 mg total) by mouth 2 (two) times daily.   IRON PO Take 325 mg by mouth daily.   latanoprost 0.005 % ophthalmic solution Commonly known as: XALATAN Place 1 drop into the left eye at bedtime.   magnesium hydroxide 400  MG/5ML suspension Commonly known as: MILK OF MAGNESIA Take 30 mLs by mouth daily as needed for mild constipation.   polyethylene glycol 17 g packet Commonly known as: MIRALAX / GLYCOLAX Take 17 g by mouth daily.   potassium chloride SA 20 MEQ tablet Commonly known as: KLOR-CON Take 20 mEq by mouth daily.   rosuvastatin 10 MG tablet Commonly known as: CRESTOR TAKE 1 TABLET ONCE DAILY.   venlafaxine XR 75 MG 24 hr capsule Commonly known as: EFFEXOR-XR TAKE 2 CAPSULES (150MG ) BY MOUTH DAILY.   vitamin B-12 1000 MCG tablet Commonly known as: CYANOCOBALAMIN Take 1,000 mcg by mouth daily.   vitamin C 500 MG tablet Commonly known as: ASCORBIC ACID Take 500 mg by mouth daily.   Vitamin D3 50 MCG (2000 UT) capsule Take 2,000 Units by mouth daily.   ZINC-220 PO Take 220 mg by mouth daily.       Review of Systems  Constitutional: Negative for activity change, appetite change, fatigue and fever.  HENT: Positive for congestion, rhinorrhea, sneezing and sore throat. Negative for ear pain, sinus pressure, sinus pain, trouble swallowing and voice change.   Eyes: Negative for itching and visual disturbance.  Respiratory: Negative for cough, shortness of breath and wheezing.   Cardiovascular: Negative for chest pain and leg swelling.  Musculoskeletal: Negative for myalgias.  Psychiatric/Behavioral: Negative for dysphoric mood. The patient is not nervous/anxious.     Immunization History  Administered Date(s) Administered  . Fluad Quad(high Dose 65+) 06/13/2019  . Influenza Split 06/18/2012  . Influenza Whole 07/11/2010  . Influenza, High Dose Seasonal PF 07/07/2013, 07/14/2014, 06/24/2015, 06/13/2016, 06/04/2017, 06/17/2018  . Influenza-Unspecified 06/22/2020  . Moderna Sars-Covid-2 Vaccination 09/14/2019, 10/12/2019  . Pneumococcal Conjugate-13 04/27/2014  . Pneumococcal Polysaccharide-23 10/15/2017  . Td 09/10/2005  . Zoster 11/29/2006   Pertinent  Health Maintenance Due   Topic Date Due  . INFLUENZA VACCINE  04/10/2021  . DEXA SCAN  Completed  . PNA vac Low Risk Adult  Completed   Fall Risk  08/31/2020 10/16/2019 08/04/2019 07/31/2018 06/17/2018  Falls in the past year? 1 0 0 0 No  Comment - - Emmi Telephone Survey: data to providers prior to load Emmi Telephone Survey: data to providers prior to load -  Number falls in past yr: 1 - - - -  Injury with Fall? 1 - - - -  Comment September 2021 head injury minor - - - -  Risk for fall due to : - Medication side effect - - -  Risk for fall due to: Comment - - - - -  Follow up - Falls evaluation completed;Education provided;Falls prevention discussed - - -   Functional Status Survey:    Vitals:   01/10/21 0953  BP: 134/77  Pulse: 94  Resp: 20  Temp:  97.7 F (36.5 C)  SpO2: 95%  Weight: 134 lb 3.2 oz (60.9 kg)  Height: 5\' 3"  (1.6 m)   Body mass index is 23.77 kg/m. Physical Exam Vitals reviewed.  Constitutional:      General: She is not in acute distress. HENT:     Head: Normocephalic.     Right Ear: There is no impacted cerumen.     Left Ear: There is no impacted cerumen.     Nose: Congestion present.     Right Turbinates: Enlarged.     Left Turbinates: Enlarged.     Right Sinus: No maxillary sinus tenderness or frontal sinus tenderness.     Left Sinus: No maxillary sinus tenderness or frontal sinus tenderness.     Mouth/Throat:     Lips: Pink.     Mouth: Mucous membranes are moist.     Pharynx: Oropharynx is clear. Uvula midline. No pharyngeal swelling or posterior oropharyngeal erythema.     Tonsils: No tonsillar exudate or tonsillar abscesses.  Eyes:     General:        Right eye: No discharge.        Left eye: No discharge.  Cardiovascular:     Rate and Rhythm: Normal rate and regular rhythm.     Pulses: Normal pulses.     Heart sounds: Normal heart sounds. No murmur heard.   Pulmonary:     Effort: Pulmonary effort is normal. No respiratory distress.     Breath sounds: Normal  breath sounds. No wheezing.  Skin:    General: Skin is warm and dry.     Capillary Refill: Capillary refill takes less than 2 seconds.  Neurological:     General: No focal deficit present.     Mental Status: She is alert and oriented to person, place, and time.  Psychiatric:        Mood and Affect: Mood normal.        Behavior: Behavior normal.     Labs reviewed: Recent Labs    12/12/20 0325 12/13/20 0430 12/14/20 0410 12/21/20 0000 12/24/20 0000  NA 138 136 141 141 138  K 4.5 3.8 3.6 2.7* 3.5  CL 109 103 105 103 103  CO2 25 26 26  28* 29*  GLUCOSE 122* 134* 116*  --   --   BUN 6* 9 13 10 10   CREATININE 0.34* 0.42* 0.45 0.4* 0.4*  CALCIUM 8.4* 8.7* 8.9 8.5* 9.1  MG 2.2 1.8 1.7 1.8  --   PHOS 3.4 4.2 4.3  --   --    Recent Labs    12/10/20 0552 12/11/20 0405 12/12/20 0325 12/21/20 0000  AST 16 17 17  12*  ALT 12 12 12 9   ALKPHOS 42 41 45 65  BILITOT 0.6 0.5 0.3  --   PROT 5.2* 5.2* 5.3*  --   ALBUMIN 2.2* 2.2* 2.2* 2.9*   Recent Labs    12/09/20 0446 12/12/20 0325 12/14/20 0410 12/21/20 0000 12/29/20 0000  WBC 6.9 7.9 8.8 4.3 5.5  NEUTROABS 5.1 6.0  --  2,683.00  --   HGB 8.1* 7.4* 7.2* 7.6* 10.0*  HCT 25.5* 24.3* 23.6* 26* 33*  MCV 88.2 93.1 94.0  --   --   PLT 172 144* 170 195 201   Lab Results  Component Value Date   TSH 1.40 12/21/2020   Lab Results  Component Value Date   HGBA1C 5.9 (H) 09/26/2015   Lab Results  Component Value Date   CHOL 136 12/21/2020  HDL 93 (A) 12/21/2020   LDLCALC 72 12/21/2020   LDLDIRECT 147.0 11/17/2012   TRIG 119 12/21/2020   CHOLHDL 3 02/10/2020    Significant Diagnostic Results in last 30 days:  DG CHEST PORT 1 VIEW  Result Date: 12/13/2020 CLINICAL DATA:  Abdominal pain. EXAM: PORTABLE CHEST 1 VIEW COMPARISON:  December 03, 2020. FINDINGS: The heart size and mediastinal contours are within normal limits. Both lungs are clear. No pneumothorax or pleural effusion is noted. Right-sided PICC line is noted with  tip in expected position of cavoatrial junction. The visualized skeletal structures are unremarkable. IMPRESSION: No active disease. Electronically Signed   By: Marijo Conception M.D.   On: 12/13/2020 16:26   DG Abd Portable 2V  Result Date: 12/13/2020 CLINICAL DATA:  Abdominal pain. EXAM: PORTABLE ABDOMEN - 2 VIEW COMPARISON:  December 02, 2020. FINDINGS: The bowel gas pattern is normal. There is no evidence of free air. No radio-opaque calculi or other significant radiographic abnormality is seen. IMPRESSION: Negative. Electronically Signed   By: Marijo Conception M.D.   On: 12/13/2020 16:25    Assessment/Plan 1. Seasonal allergies - clear nasal congestion with swollen turbinates - zyrtec 10 mg po QHS x 1 month  2. Sore throat - afebrile, pharynx with no redness or swelling - suspect due to allergies - cepacol throat lozenges- 1 lozenge Q4 hrs prn - encourage warm fluids    Family/ staff Communication:   Labs/tests ordered:

## 2021-01-12 DIAGNOSIS — R2681 Unsteadiness on feet: Secondary | ICD-10-CM | POA: Diagnosis not present

## 2021-01-12 DIAGNOSIS — Z933 Colostomy status: Secondary | ICD-10-CM | POA: Diagnosis not present

## 2021-01-12 DIAGNOSIS — R29898 Other symptoms and signs involving the musculoskeletal system: Secondary | ICD-10-CM | POA: Diagnosis not present

## 2021-01-12 DIAGNOSIS — M6281 Muscle weakness (generalized): Secondary | ICD-10-CM | POA: Diagnosis not present

## 2021-01-12 DIAGNOSIS — K5792 Diverticulitis of intestine, part unspecified, without perforation or abscess without bleeding: Secondary | ICD-10-CM | POA: Diagnosis not present

## 2021-01-13 DIAGNOSIS — R2681 Unsteadiness on feet: Secondary | ICD-10-CM | POA: Diagnosis not present

## 2021-01-13 DIAGNOSIS — M6281 Muscle weakness (generalized): Secondary | ICD-10-CM | POA: Diagnosis not present

## 2021-01-13 DIAGNOSIS — Z933 Colostomy status: Secondary | ICD-10-CM | POA: Diagnosis not present

## 2021-01-13 DIAGNOSIS — R29898 Other symptoms and signs involving the musculoskeletal system: Secondary | ICD-10-CM | POA: Diagnosis not present

## 2021-01-13 DIAGNOSIS — K5792 Diverticulitis of intestine, part unspecified, without perforation or abscess without bleeding: Secondary | ICD-10-CM | POA: Diagnosis not present

## 2021-01-16 DIAGNOSIS — Z933 Colostomy status: Secondary | ICD-10-CM | POA: Diagnosis not present

## 2021-01-16 DIAGNOSIS — M6281 Muscle weakness (generalized): Secondary | ICD-10-CM | POA: Diagnosis not present

## 2021-01-16 DIAGNOSIS — R29898 Other symptoms and signs involving the musculoskeletal system: Secondary | ICD-10-CM | POA: Diagnosis not present

## 2021-01-16 DIAGNOSIS — R2681 Unsteadiness on feet: Secondary | ICD-10-CM | POA: Diagnosis not present

## 2021-01-16 DIAGNOSIS — K5792 Diverticulitis of intestine, part unspecified, without perforation or abscess without bleeding: Secondary | ICD-10-CM | POA: Diagnosis not present

## 2021-01-17 DIAGNOSIS — R29898 Other symptoms and signs involving the musculoskeletal system: Secondary | ICD-10-CM | POA: Diagnosis not present

## 2021-01-17 DIAGNOSIS — M6281 Muscle weakness (generalized): Secondary | ICD-10-CM | POA: Diagnosis not present

## 2021-01-17 DIAGNOSIS — K5792 Diverticulitis of intestine, part unspecified, without perforation or abscess without bleeding: Secondary | ICD-10-CM | POA: Diagnosis not present

## 2021-01-17 DIAGNOSIS — Z933 Colostomy status: Secondary | ICD-10-CM | POA: Diagnosis not present

## 2021-01-17 DIAGNOSIS — R2681 Unsteadiness on feet: Secondary | ICD-10-CM | POA: Diagnosis not present

## 2021-01-18 DIAGNOSIS — R2681 Unsteadiness on feet: Secondary | ICD-10-CM | POA: Diagnosis not present

## 2021-01-18 DIAGNOSIS — M6281 Muscle weakness (generalized): Secondary | ICD-10-CM | POA: Diagnosis not present

## 2021-01-18 DIAGNOSIS — K5792 Diverticulitis of intestine, part unspecified, without perforation or abscess without bleeding: Secondary | ICD-10-CM | POA: Diagnosis not present

## 2021-01-18 DIAGNOSIS — Z933 Colostomy status: Secondary | ICD-10-CM | POA: Diagnosis not present

## 2021-01-18 DIAGNOSIS — R29898 Other symptoms and signs involving the musculoskeletal system: Secondary | ICD-10-CM | POA: Diagnosis not present

## 2021-01-20 ENCOUNTER — Encounter: Payer: Self-pay | Admitting: Nurse Practitioner

## 2021-01-20 ENCOUNTER — Non-Acute Institutional Stay (SKILLED_NURSING_FACILITY): Payer: Medicare Other | Admitting: Nurse Practitioner

## 2021-01-20 DIAGNOSIS — M6281 Muscle weakness (generalized): Secondary | ICD-10-CM | POA: Diagnosis not present

## 2021-01-20 DIAGNOSIS — J309 Allergic rhinitis, unspecified: Secondary | ICD-10-CM | POA: Insufficient documentation

## 2021-01-20 DIAGNOSIS — E785 Hyperlipidemia, unspecified: Secondary | ICD-10-CM

## 2021-01-20 DIAGNOSIS — R2681 Unsteadiness on feet: Secondary | ICD-10-CM | POA: Diagnosis not present

## 2021-01-20 DIAGNOSIS — J302 Other seasonal allergic rhinitis: Secondary | ICD-10-CM

## 2021-01-20 DIAGNOSIS — E876 Hypokalemia: Secondary | ICD-10-CM

## 2021-01-20 DIAGNOSIS — E538 Deficiency of other specified B group vitamins: Secondary | ICD-10-CM | POA: Diagnosis not present

## 2021-01-20 DIAGNOSIS — I10 Essential (primary) hypertension: Secondary | ICD-10-CM

## 2021-01-20 DIAGNOSIS — F339 Major depressive disorder, recurrent, unspecified: Secondary | ICD-10-CM | POA: Diagnosis not present

## 2021-01-20 DIAGNOSIS — I63342 Cerebral infarction due to thrombosis of left cerebellar artery: Secondary | ICD-10-CM

## 2021-01-20 DIAGNOSIS — K5901 Slow transit constipation: Secondary | ICD-10-CM | POA: Diagnosis not present

## 2021-01-20 DIAGNOSIS — R29898 Other symptoms and signs involving the musculoskeletal system: Secondary | ICD-10-CM | POA: Diagnosis not present

## 2021-01-20 DIAGNOSIS — D500 Iron deficiency anemia secondary to blood loss (chronic): Secondary | ICD-10-CM | POA: Diagnosis not present

## 2021-01-20 DIAGNOSIS — Z933 Colostomy status: Secondary | ICD-10-CM | POA: Diagnosis not present

## 2021-01-20 DIAGNOSIS — K5792 Diverticulitis of intestine, part unspecified, without perforation or abscess without bleeding: Secondary | ICD-10-CM | POA: Diagnosis not present

## 2021-01-20 NOTE — Assessment & Plan Note (Signed)
Vit B12 deficiency, takes Vit B, Vit B12 942

## 2021-01-20 NOTE — Progress Notes (Signed)
Location:   [place] Room Number: [place] of Service:  SNF (31) Provider:  Nera Haworth Otho Darner, NP    Patient Care Team: Virgie Dad, MD as PCP - General (Internal Medicine) Viona Gilmore, [facility] as Pharmacist (Pharmacist)  Extended Emergency Contact Information Primary Emergency Contact: Asa [place] [employer] Phone: [phone] Relation: Daughter  Code Status:  FULL CODE Goals of care: Advanced Directive information Advanced Directives 01/20/2021  Does Patient Have a Medical Advance Directive? Yes  Type of Paramedic of [place];Living will  Does patient want to make changes to medical advance directive? No - Patient declined  Copy of [place] in Chart? No - copy requested  Would patient like information on creating a medical advance directive? -     Chief Complaint  Patient presents with  . Medical Management of Chronic Issues    Routine follow up.  Marland Kitchen Health Maintenance    Discuss need for TD/Tdap vaccine    HPI:  Pt is a 87 y.o. female seen today for medical management of chronic diseases.      Hospitalized 12/02/20-12/15/20 for diverticulitis with distal sigmoid obstruction. Presentation is LLQ abd pain. Had Hartman's procedure, splenic flexure mobilization and end colostomy.  HTN takes Amlodipine Bun/creat 10/0.4 12/24/20 Hypomagnesemia, Mg 1.8 12/20/20 Vit B12 deficiency, takes Vit B, Vit B12 942 Hx of CVA w/o residual deficit Hyperlipidemia, takes Rosuvastatin, LDL 72 12/20/20 Depression, takes Venlafaxine 137m qd Constipation, takes MiraLax Colace Post op anemia, takes Fe Hgb 10.0 12/29/20  Hypokalemia, supplemented, takes Kcl, serum K 3.5 12/24/20  Allergic rhinitis, takes Zyrtec.    Past Medical History:  Diagnosis Date  . Arthritis   . Depression   . Hyperlipidemia   .  Osteopenia   . [place]   . [place]   . Vitamin D deficiency   . Wrist fracture 2002   left   Past Surgical History:  Procedure Laterality Date  . APPENDECTOMY  1967  . CARPAL TUNNEL RELEASE  2008   right  . COLON RESECTION SIGMOID N/A 12/03/2020   Procedure: EXPLORATORY LAPAROTOMY; HARTMAN'S PROCEDURE; SPLEENIC FLEXURE MOBILIZATION;  Surgeon: TLeighton Ruff MD;  Location: [facility];  Service: General;  Laterality: N/A;  . COLOSTOMY N/A 12/03/2020   Procedure: COLOSTOMY;  Surgeon: TLeighton Ruff MD;  Location: [facility];  Service: General;  Laterality: N/A;  . JOINT REPLACEMENT  2002   right total     Allergies  Allergen Reactions  . Bactrim [Sulfamethoxazole-Trimethoprim] Nausea Only  . Ditropan [Oxybutynin]     Made patient feel bad  . Sulfa Antibiotics     Other reaction(s): stomach upset    Allergies as of 01/20/2021      Reactions   Bactrim [sulfamethoxazole-trimethoprim] Nausea Only   Ditropan [oxybutynin]    Made patient feel bad   Sulfa Antibiotics    Other reaction(s): stomach upset      Medication List       Accurate as of Jan 20, 2021 11:59 PM. If you have any questions, ask your nurse or doctor.        acetaminophen 325 MG tablet Commonly known as: TYLENOL Take 2 tablets (650 mg total) by mouth every 6 (six) hours as needed for mild pain or moderate pain.   alum & mag hydroxide-simeth 200-200-20 MG/5ML suspension Commonly known as: MAALOX/MYLANTA Take 30 mLs by mouth every 4 (four) hours as needed for indigestion or heartburn.   amLODipine 2.5  MG tablet Commonly known as: NORVASC Take 2.5 mg by mouth daily.   aspirin 325 MG EC tablet Take 1 tablet (325 mg total) by mouth daily.   Biotin 10 MG Tabs Take 10 mg by mouth daily.   Cepacol Sore Throat 15-3.6 MG Lozg Generic drug: Benzocaine-Menthol Use as directed 1 lozenge in the mouth or throat every 4 (four) hours as needed.   cetirizine 10 MG tablet Commonly known as:  ZYRTEC Take 10 mg by mouth at bedtime.   CHELATED MAGNESIUM PO Take 300 mg by mouth daily.   docusate sodium 100 MG capsule Commonly known as: COLACE Take 1 capsule (100 mg total) by mouth 2 (two) times daily.   feeding supplement (BOOST BREEZE) Liqd Take 237 mLs by mouth 3 (three) times daily between meals. What changed: Another medication with the same name was removed. Continue taking this medication, and follow the directions you see here. Changed by: Damien Cisar X Yashar Inclan, NP   IRON PO Take 325 mg by mouth daily.   latanoprost 0.005 % ophthalmic solution Commonly known as: XALATAN Place 1 drop into the left eye at bedtime.   magnesium hydroxide 400 MG/5ML suspension Commonly known as: MILK OF MAGNESIA Take 30 mLs by mouth daily as needed for mild constipation.   polyethylene glycol 17 g packet Commonly known as: MIRALAX / GLYCOLAX Take 17 g by mouth daily.   potassium chloride SA 20 MEQ tablet Commonly known as: KLOR-CON Take 20 mEq by mouth daily.   rosuvastatin 10 MG tablet Commonly known as: CRESTOR TAKE 1 TABLET ONCE DAILY.   venlafaxine XR 75 MG 24 hr capsule Commonly known as: EFFEXOR-XR TAKE 2 CAPSULES (150MG) BY MOUTH DAILY.   vitamin B-12 1000 MCG tablet Commonly known as: CYANOCOBALAMIN Take 1,000 mcg by mouth daily.   vitamin C 500 MG tablet Commonly known as: ASCORBIC ACID Take 500 mg by mouth daily.   Vitamin D3 50 MCG (2000 UT) capsule Take 2,000 Units by mouth daily.   ZINC-220 PO Take 220 mg by mouth daily.       Review of Systems  Constitutional: Negative for fatigue, fever and unexpected weight change.  HENT: Positive for congestion and rhinorrhea. Negative for hearing loss, sinus pressure, sinus pain, sneezing, sore throat and trouble swallowing.   Eyes: Negative for visual disturbance.  Respiratory: Negative for cough and shortness of breath.   Cardiovascular: Negative for chest pain and palpitations.  Gastrointestinal: Negative for  abdominal pain, constipation, nausea and vomiting.  Genitourinary: Positive for frequency. Negative for dysuria and urgency.  Musculoskeletal: Positive for arthralgias and gait problem.  Skin: Positive for pallor.  Neurological: Negative for speech difficulty, weakness and light-headedness.  Psychiatric/Behavioral: Positive for sleep disturbance. Negative for confusion. The patient is not nervous/anxious.        Did not sleep well last night.     Immunization History  Administered Date(s) Administered  . Fluad Quad(high Dose 65+) 06/13/2019  . Influenza Split 06/18/2012  . Influenza Whole 07/11/2010  . Influenza, High Dose Seasonal PF 07/07/2013, 07/14/2014, 06/24/2015, 06/13/2016, 06/04/2017, 06/17/2018  . Influenza-Unspecified 06/22/2020  . Moderna SARS-COV2 Booster Vaccination 07/19/2020  . Moderna Sars-Covid-2 Vaccination 09/14/2019, 10/12/2019  . Pneumococcal Conjugate-13 04/27/2014  . Pneumococcal Polysaccharide-23 10/15/2017  . Td 09/10/2005  . Zoster 11/29/2006   Pertinent  Health Maintenance Due  Topic Date Due  . INFLUENZA VACCINE  04/10/2021  . DEXA SCAN  Completed  . PNA vac Low Risk Adult  Completed   Fall Risk  08/31/2020 10/16/2019  08/04/2019 07/31/2018 06/17/2018  Falls in the past year? 1 0 0 0 No  Comment - - Emmi Telephone Survey: data to providers prior to load Emmi Telephone Survey: data to providers prior to load -  Number falls in past yr: 1 - - - -  Injury with Fall? 1 - - - -  Comment September 2021 head injury minor - - - -  Risk for fall due to : - Medication side effect - - -  Risk for fall due to: Comment - - - - -  Follow up - Falls evaluation completed;Education provided;Falls prevention discussed - - -   Functional Status Survey:    Vitals:   01/20/21 0937  BP: 139/67  Pulse: 95  Resp: 20  Temp: 97.8 F (36.6 C)  SpO2: 98%  Weight: 135 lb 11.2 oz (61.6 kg)  Height: 5' 3"  (1.6 m)   Body mass index is 24.04 kg/m. Physical Exam Vitals  and nursing note reviewed.  Constitutional:      Appearance: Normal appearance.  HENT:     Head: Normocephalic and atraumatic.     Nose: Congestion and rhinorrhea present.     Comments: mild    Mouth/Throat:     Mouth: Mucous membranes are moist.  Eyes:     Extraocular Movements: Extraocular movements intact.     Conjunctiva/sclera: Conjunctivae normal.     Pupils: Pupils are equal, round, and reactive to light.  Cardiovascular:     Rate and Rhythm: Normal rate and regular rhythm.     Heart sounds: No murmur heard.   Pulmonary:     Effort: Pulmonary effort is normal.     Breath sounds: No rales.  Abdominal:     General: Bowel sounds are normal.     Palpations: Abdomen is soft.     Tenderness: There is no abdominal tenderness.     Comments: Colostomy  Musculoskeletal:     Cervical back: Normal range of motion and neck supple.     Right lower leg: Edema present.     Left lower leg: Edema present.     Comments: Trace edema in ankles.   Skin:    General: Skin is warm and dry.     Comments: Mid abd surgical scar. Colostomy is functioning.   Neurological:     General: No focal deficit present.     Mental Status: She is alert and oriented to person, place, and time. Mental status is at baseline.     Gait: Gait abnormal.  Psychiatric:        Mood and Affect: Mood normal.        Behavior: Behavior normal.        Thought Content: Thought content normal.        Judgment: Judgment normal.     Labs reviewed: Recent Labs    12/12/20 0325 12/13/20 0430 12/14/20 0410 12/21/20 0000 12/24/20 0000  NA 138 136 141 141 138  K 4.5 3.8 3.6 2.7* 3.5  CL 109 103 105 103 103  CO2 25 26 26  28* 29*  GLUCOSE 122* 134* 116*  --   --   BUN 6* 9 13 10 10   CREATININE 0.34* 0.42* 0.45 0.4* 0.4*  CALCIUM 8.4* 8.7* 8.9 8.5* 9.1  MG 2.2 1.8 1.7 1.8  --   PHOS 3.4 4.2 4.3  --   --    Recent Labs    12/10/20 0552 12/11/20 0405 12/12/20 0325 12/21/20 0000  AST 16 17 17  12*  ALT 12 12  12 9   ALKPHOS 42 41 45 65  BILITOT 0.6 0.5 0.3  --   PROT 5.2* 5.2* 5.3*  --   ALBUMIN 2.2* 2.2* 2.2* 2.9*   Recent Labs    12/09/20 0446 12/12/20 0325 12/14/20 0410 12/21/20 0000 12/29/20 0000  WBC 6.9 7.9 8.8 4.3 5.5  NEUTROABS 5.1 6.0  --  2,683.00  --   HGB 8.1* 7.4* 7.2* 7.6* 10.0*  HCT 25.5* 24.3* 23.6* 26* 33*  MCV 88.2 93.1 94.0  --   --   PLT 172 144* 170 195 201   Lab Results  Component Value Date   TSH 1.40 12/21/2020   Lab Results  Component Value Date   HGBA1C 5.9 (H) 09/26/2015   Lab Results  Component Value Date   CHOL 136 12/21/2020   HDL 93 (A) 12/21/2020   LDLCALC 72 12/21/2020   LDLDIRECT 147.0 11/17/2012   TRIG 119 12/21/2020   CHOLHDL 3 02/10/2020    Significant Diagnostic Results in last 30 days:  No results found.  Assessment/Plan Blood loss anemia Post op anemia, takes Fe Hgb 10.0 12/29/20. May decrease Fe if H+H is stable, update CBC/diff.    Hypokalemia Hypokalemia, supplemented, takes Kcl, serum K 3.5 12/24/20. Update CMP/eGFR   Allergic rhinitis Will complete 4 weeks course of Zyrtec. Will add Flonase I spray to each nostril bid-self administer.   Slow transit constipation takes MiraLax Colace   Depression, recurrent ([place]) Her mood is stable, takes Venlafaxine 151m qd  Hyperlipidemia LDL goal <70  takes Rosuvastatin, LDL 72 12/20/20   Cerebral infarction (HCC) Hx of CVA w/o residual deficit   Vitamin B12 deficiency Vit B12 deficiency, takes Vit B, Vit B12 942  Hypomagnesemia Hypomagnesemia, Mg 1.8 12/20/20  Essential hypertension Blood pressure is controlled, takes Amlodipine Bun/creat 10/0.4 12/24/20   Col[place] status ([place] 12/02/20-12/15/20 for diverticulitis with distal sigmoid obstruction. Presentation is LLQ abd pain. Had Hartman's procedure, splenic flexure mobilization and end colostomy.  Recovering well.  Desires Vanilla boost supplement between lunch and dinner.     Family/ staff  Communication: plan of care reviewed with the patient and charge nurse.   Labs/tests ordered:  CBC/diff, CMP/eGFR  Time spend 35 minutes.

## 2021-01-20 NOTE — Assessment & Plan Note (Signed)
Blood pressure is controlled, takes Amlodipine Bun/creat 10/0.4 12/24/20

## 2021-01-20 NOTE — Assessment & Plan Note (Signed)
Hypokalemia, supplemented, takes Kcl, serum K 3.5 12/24/20. Update CMP/eGFR

## 2021-01-20 NOTE — Assessment & Plan Note (Signed)
Post op anemia, takes Fe Hgb 10.0 12/29/20. May decrease Fe if H+H is stable, update CBC/diff.

## 2021-01-20 NOTE — Assessment & Plan Note (Addendum)
Will complete 4 weeks course of Zyrtec. Will add Flonase I spray to each nostril bid-self administer.

## 2021-01-20 NOTE — Assessment & Plan Note (Signed)
Hypomagnesemia, Mg 1.8 12/20/20

## 2021-01-20 NOTE — Assessment & Plan Note (Signed)
takes MiraLax Colace

## 2021-01-20 NOTE — Assessment & Plan Note (Signed)
Hx of CVA w/o residual deficit 

## 2021-01-20 NOTE — Assessment & Plan Note (Signed)
takes Rosuvastatin, LDL 72 12/20/20 

## 2021-01-20 NOTE — Assessment & Plan Note (Signed)
Her mood is stable,  takes Venlafaxine 150mg qd 

## 2021-01-20 NOTE — Assessment & Plan Note (Addendum)
Hospitalized 12/02/20-12/15/20 for diverticulitis with distal sigmoid obstruction. Presentation is LLQ abd pain. Had Hartman's procedure, splenic flexure mobilization and end colostomy.  Recovering well.  Desires Vanilla boost supplement between lunch and dinner.

## 2021-01-23 ENCOUNTER — Encounter: Payer: Self-pay | Admitting: Nurse Practitioner

## 2021-01-23 ENCOUNTER — Non-Acute Institutional Stay (SKILLED_NURSING_FACILITY): Payer: Medicare Other | Admitting: Nurse Practitioner

## 2021-01-23 DIAGNOSIS — Z933 Colostomy status: Secondary | ICD-10-CM

## 2021-01-23 DIAGNOSIS — K5792 Diverticulitis of intestine, part unspecified, without perforation or abscess without bleeding: Secondary | ICD-10-CM | POA: Diagnosis not present

## 2021-01-23 DIAGNOSIS — R29898 Other symptoms and signs involving the musculoskeletal system: Secondary | ICD-10-CM | POA: Diagnosis not present

## 2021-01-23 DIAGNOSIS — E538 Deficiency of other specified B group vitamins: Secondary | ICD-10-CM

## 2021-01-23 DIAGNOSIS — E876 Hypokalemia: Secondary | ICD-10-CM

## 2021-01-23 DIAGNOSIS — F339 Major depressive disorder, recurrent, unspecified: Secondary | ICD-10-CM | POA: Diagnosis not present

## 2021-01-23 DIAGNOSIS — I10 Essential (primary) hypertension: Secondary | ICD-10-CM

## 2021-01-23 DIAGNOSIS — K5901 Slow transit constipation: Secondary | ICD-10-CM | POA: Diagnosis not present

## 2021-01-23 DIAGNOSIS — R21 Rash and other nonspecific skin eruption: Secondary | ICD-10-CM

## 2021-01-23 DIAGNOSIS — I63342 Cerebral infarction due to thrombosis of left cerebellar artery: Secondary | ICD-10-CM

## 2021-01-23 DIAGNOSIS — R2681 Unsteadiness on feet: Secondary | ICD-10-CM | POA: Diagnosis not present

## 2021-01-23 DIAGNOSIS — E785 Hyperlipidemia, unspecified: Secondary | ICD-10-CM

## 2021-01-23 DIAGNOSIS — M6281 Muscle weakness (generalized): Secondary | ICD-10-CM | POA: Diagnosis not present

## 2021-01-23 DIAGNOSIS — D500 Iron deficiency anemia secondary to blood loss (chronic): Secondary | ICD-10-CM | POA: Diagnosis not present

## 2021-01-23 DIAGNOSIS — J302 Other seasonal allergic rhinitis: Secondary | ICD-10-CM | POA: Diagnosis not present

## 2021-01-23 NOTE — Assessment & Plan Note (Addendum)
rash covering entire body, non itching, the patient stated it started from her lower leg, spread  to her chest and upper back started 01/19/21. Also the patient was started Flonase nasal spray and Vanilla Boost 01/20/21. Denied upset stomach, nausea, vomiting, diarrhea, malaise, headache, running nose, sore throat, cough, chest pain, denied new cosmetic products, garment, bedding, laundry detergent.  Drug rash vs viral rash, spots spread and merge, asymptomatic. Will have Benadryl prn x 5 days. Observe.  01/24/21 wbc 4.5, Hgb 11.0, plt 104, eosinophils 671, neutrophils 56.6%, Na 137, K 4.3, Bun 17, creat 0.63, eGFR 81

## 2021-01-23 NOTE — Assessment & Plan Note (Signed)
Hospitalized 12/02/20-12/15/20 for diverticulitis with distal sigmoid obstruction. Presentation is LLQ abd pain. Had Hartman's procedure, splenic flexure mobilization and end colostomy. Started Vanilla boost 01/20/21

## 2021-01-23 NOTE — Assessment & Plan Note (Signed)
takes MiraLax Colace

## 2021-01-23 NOTE — Assessment & Plan Note (Signed)
takes Rosuvastatin, LDL 72 12/20/20 

## 2021-01-23 NOTE — Progress Notes (Addendum)
Location:   SNF [place] Room Number: G818 Place of Service:  SNF (31) Provider: [facility] Lindsee Labarre NP  Virgie Dad, MD  Patient Care Team: Virgie Dad, MD as PCP - General (Internal Medicine) Viona Gilmore, [facility] as Pharmacist (Pharmacist)  Extended Emergency Contact Information Primary Emergency Contact: [place] [employer] Phone: [phone] Relation: Daughter  Code Status: DNR Goals of care: Advanced Directive information Advanced Directives 01/23/2021  Does Patient Have a Medical Advance Directive? Yes  Type of Paramedic of [place];Living will  Does patient want to make changes to medical advance directive? No - Patient declined  Copy of [place] in Chart? No - copy requested  Would patient like information on creating a medical advance directive? -     Chief Complaint  Patient presents with  . Acute Visit    Patient presents with a rash.    HPI:  Pt is a 87 y.o. female seen today for an acute visit for rash covering entire body, non itching, the patient stated it started from her lower leg, spread  to her chest and upper back started 01/19/21. Also the patient was started Flonase nasal spray and Vanilla Boost 01/20/21. Denied upset stomach, nausea, vomiting, diarrhea, malaise, headache, running nose, sore throat, cough, chest pain, denied new cosmetic products, garment, bedding, laundry detergent.   Hospitalized 12/02/20-12/15/20 for diverticulitis with distal sigmoid obstruction. Presentation is LLQ abd pain. Had Hartman's procedure, splenic flexure mobilization and end colostomy. Started Vanilla boost 01/20/21 HTN takes Amlodipine Bun/creat10/0.4 12/24/20 Hypomagnesemia,Mg 1.8 12/20/20 Vit B12 deficiency, takes Vit B, Vit B12 942 Hx of CVA w/o residual deficit Hyperlipidemia, takes Rosuvastatin, LDL 72  12/20/20 Depression, takes Venlafaxine 180m qd Constipation, takes MiraLax Colace Post op anemia, takes Fe Hgb 10.0 12/29/20             Hypokalemia, supplemented, takes Kcl, serum K 3.5 12/24/20             Allergic rhinitis, takes Zyrtec. Started Flonase 01/20/21      Past Medical History:  Diagnosis Date  . Arthritis   . Depression   . Hyperlipidemia   . Osteopenia   . [place]   . Thrombocytopenia ([place]   . Vitamin D deficiency   . Wrist fracture 2002   left   Past Surgical History:  Procedure Laterality Date  . APPENDECTOMY  1967  . CARPAL TUNNEL RELEASE  2008   right  . COLON RESECTION SIGMOID N/A 12/03/2020   Procedure: EXPLORATORY LAPAROTOMY; HARTMAN'S PROCEDURE; SPLEENIC FLEXURE MOBILIZATION;  Surgeon: TLeighton Ruff MD;  Location: [facility] ORS;  Service: General;  Laterality: N/A;  . COLOSTOMY N/A 12/03/2020   Procedure: COLOSTOMY;  Surgeon: TLeighton Ruff MD;  Location: [facility] ORS;  Service: General;  Laterality: N/A;  . JOINT REPLACEMENT  2002   right total     Allergies  Allergen Reactions  . Bactrim [Sulfamethoxazole-Trimethoprim] Nausea Only  . Ditropan [Oxybutynin]     Made patient feel bad  . Sulfa Antibiotics     Other reaction(s): stomach upset    Allergies as of 01/23/2021      Reactions   Bactrim [sulfamethoxazole-trimethoprim] Nausea Only   Ditropan [oxybutynin]    Made patient feel bad   Sulfa Antibiotics    Other reaction(s): stomach upset      Medication List       Accurate as of Jan 23, 2021 11:59 PM. If you have any questions, ask your nurse  or doctor.        STOP taking these medications   CHELATED MAGNESIUM PO Stopped by: Bevan Disney X Kieren Ricci, NP     TAKE these medications   acetaminophen 325 MG tablet Commonly known as: TYLENOL Take 2 tablets (650 mg total) by mouth every 6 (six) hours as needed for mild pain or moderate pain.   alum & mag hydroxide-simeth 200-200-20 MG/5ML suspension Commonly known  as: MAALOX/MYLANTA Take 30 mLs by mouth every 4 (four) hours as needed for indigestion or heartburn.   amLODipine 2.5 MG tablet Commonly known as: NORVASC Take 2.5 mg by mouth daily.   aspirin 325 MG EC tablet Take 1 tablet (325 mg total) by mouth daily.   Biotin 10 MG Tabs Take 10 mg by mouth daily.   Cepacol Sore Throat 15-3.6 MG Lozg Generic drug: Benzocaine-Menthol Use as directed 1 lozenge in the mouth or throat every 4 (four) hours as needed.   cetirizine 10 MG tablet Commonly known as: ZYRTEC Take 10 mg by mouth at bedtime.   docusate sodium 100 MG capsule Commonly known as: COLACE Take 1 capsule (100 mg total) by mouth 2 (two) times daily.   feeding supplement (BOOST BREEZE) Liqd Take 237 mLs by mouth 3 (three) times daily between meals.   Flonase Allergy Relief 50 MCG/ACT nasal spray Generic drug: fluticasone Place 1 spray into both nostrils 2 (two) times daily.   IRON PO Take 325 mg by mouth daily.   latanoprost 0.005 % ophthalmic solution Commonly known as: XALATAN Place 1 drop into the left eye at bedtime.   magnesium hydroxide 400 MG/5ML suspension Commonly known as: MILK OF MAGNESIA Take 30 mLs by mouth daily as needed for mild constipation.   polyethylene glycol 17 g packet Commonly known as: MIRALAX / GLYCOLAX Take 17 g by mouth daily.   potassium chloride SA 20 MEQ tablet Commonly known as: KLOR-CON Take 20 mEq by mouth daily.   rosuvastatin 10 MG tablet Commonly known as: CRESTOR TAKE 1 TABLET ONCE DAILY.   venlafaxine XR 75 MG 24 hr capsule Commonly known as: EFFEXOR-XR TAKE 2 CAPSULES (150MG) BY MOUTH DAILY.   vitamin B-12 1000 MCG tablet Commonly known as: CYANOCOBALAMIN Take 1,000 mcg by mouth daily.   vitamin C 500 MG tablet Commonly known as: ASCORBIC ACID Take 500 mg by mouth daily.   Vitamin D3 50 MCG (2000 UT) capsule Take 2,000 Units by mouth daily.   ZINC-220 PO Take 220 mg by mouth daily.       Review of Systems   Constitutional: Negative for appetite change, fatigue and fever.  HENT: Positive for congestion and rhinorrhea. Negative for hearing loss, sinus pressure, sinus pain, sneezing, sore throat and trouble swallowing.   Eyes: Negative for visual disturbance.  Respiratory: Negative for cough, shortness of breath and wheezing.   Cardiovascular: Positive for leg swelling. Negative for chest pain and palpitations.  Gastrointestinal: Negative for abdominal pain, constipation, nausea and vomiting.  Genitourinary: Positive for frequency. Negative for dysuria and urgency.  Musculoskeletal: Positive for arthralgias and gait problem.  Skin: Positive for rash.  Neurological: Negative for speech difficulty, weakness and light-headedness.  Psychiatric/Behavioral: Positive for sleep disturbance. Negative for confusion. The patient is not nervous/anxious.        Did not sleep well last night.     Immunization History  Administered Date(s) Administered  . Fluad Quad(high Dose 65+) 06/13/2019  . Influenza Split 06/18/2012  . Influenza Whole 07/11/2010  . Influenza, High Dose Seasonal PF  07/07/2013, 07/14/2014, 06/24/2015, 06/13/2016, 06/04/2017, 06/17/2018  . Influenza-Unspecified 06/22/2020  . Moderna SARS-COV2 Booster Vaccination 07/19/2020  . Moderna Sars-Covid-2 Vaccination 09/14/2019, 10/12/2019  . Pneumococcal Conjugate-13 04/27/2014  . Pneumococcal Polysaccharide-23 10/15/2017  . Td 09/10/2005  . Zoster 11/29/2006   Pertinent  Health Maintenance Due  Topic Date Due  . INFLUENZA VACCINE  04/10/2021  . DEXA SCAN  Completed  . PNA vac Low Risk Adult  Completed   Fall Risk  08/31/2020 10/16/2019 08/04/2019 07/31/2018 06/17/2018  Falls in the past year? 1 0 0 0 No  Comment - - Emmi Telephone Survey: data to providers prior to load Emmi Telephone Survey: data to providers prior to load -  Number falls in past yr: 1 - - - -  Injury with Fall? 1 - - - -  Comment September 2021 head injury minor - - -  -  Risk for fall due to : - Medication side effect - - -  Risk for fall due to: Comment - - - - -  Follow up - Falls evaluation completed;Education provided;Falls prevention discussed - - -   Functional Status Survey:    Vitals:   01/23/21 1505  BP: 116/72  Pulse: 91  Resp: 20  Temp: 98 F (36.7 C)  SpO2: 99%  Weight: 135 lb 11.2 oz (61.6 kg)  Height: 5' 3"  (1.6 m)   Body mass index is 24.04 kg/m. Physical Exam Vitals and nursing note reviewed.  Constitutional:      Appearance: Normal appearance.  HENT:     Head: Normocephalic and atraumatic.     Nose: Congestion and rhinorrhea present.     Comments: mild    Mouth/Throat:     Mouth: Mucous membranes are moist.  Eyes:     Extraocular Movements: Extraocular movements intact.     Conjunctiva/sclera: Conjunctivae normal.     Pupils: Pupils are equal, round, and reactive to light.  Cardiovascular:     Rate and Rhythm: Normal rate and regular rhythm.     Heart sounds: No murmur heard.   Pulmonary:     Effort: Pulmonary effort is normal.     Breath sounds: No rales.  Abdominal:     General: Bowel sounds are normal.     Palpations: Abdomen is soft.     Tenderness: There is no abdominal tenderness.     Comments: Colostomy  Musculoskeletal:     Cervical back: Normal range of motion and neck supple.     Right lower leg: Edema present.     Left lower leg: Edema present.     Comments: Trace edema in ankles.   Skin:    General: Skin is warm and dry.     Findings: Rash present.     Comments: Mid abd surgical scar. Colostomy is functioning. Pinkish macular spots spread and merge, started from lower leg up to chest and upper back, not on her face, a few in arms. Non itching.   Neurological:     General: No focal deficit present.     Mental Status: She is alert and oriented to person, place, and time. Mental status is at baseline.     Gait: Gait abnormal.  Psychiatric:        Mood and Affect: Mood normal.        Behavior:  Behavior normal.        Thought Content: Thought content normal.        Judgment: Judgment normal.     Labs reviewed: Recent Labs  12/12/20 0325 12/13/20 0430 12/14/20 0410 12/21/20 0000 12/24/20 0000  NA 138 136 141 141 138  K 4.5 3.8 3.6 2.7* 3.5  CL 109 103 105 103 103  CO2 25 26 26  28* 29*  GLUCOSE 122* 134* 116*  --   --   BUN 6* 9 13 10 10   CREATININE 0.34* 0.42* 0.45 0.4* 0.4*  CALCIUM 8.4* 8.7* 8.9 8.5* 9.1  MG 2.2 1.8 1.7 1.8  --   PHOS 3.4 4.2 4.3  --   --    Recent Labs    12/10/20 0552 12/11/20 0405 12/12/20 0325 12/21/20 0000  AST 16 17 17  12*  ALT 12 12 12 9   ALKPHOS 42 41 45 65  BILITOT 0.6 0.5 0.3  --   PROT 5.2* 5.2* 5.3*  --   ALBUMIN 2.2* 2.2* 2.2* 2.9*   Recent Labs    12/09/20 0446 12/12/20 0325 12/14/20 0410 12/21/20 0000 12/29/20 0000  WBC 6.9 7.9 8.8 4.3 5.5  NEUTROABS 5.1 6.0  --  2,683.00  --   HGB 8.1* 7.4* 7.2* 7.6* 10.0*  HCT 25.5* 24.3* 23.6* 26* 33*  MCV 88.2 93.1 94.0  --   --   PLT 172 144* 170 195 201   Lab Results  Component Value Date   TSH 1.40 12/21/2020   Lab Results  Component Value Date   HGBA1C 5.9 (H) 09/26/2015   Lab Results  Component Value Date   CHOL 136 12/21/2020   HDL 93 (A) 12/21/2020   LDLCALC 72 12/21/2020   LDLDIRECT 147.0 11/17/2012   TRIG 119 12/21/2020   CHOLHDL 3 02/10/2020    Significant Diagnostic Results in last 30 days:  No results found.  Assessment/Plan: Rash  rash covering entire body, non itching, the patient stated it started from her lower leg, spread  to her chest and upper back started 01/19/21. Also the patient was started Flonase nasal spray and Vanilla Boost 01/20/21. Denied upset stomach, nausea, vomiting, diarrhea, malaise, headache, running nose, sore throat, cough, chest pain, denied new cosmetic products, garment, bedding, laundry detergent.  Drug rash vs viral rash, spots spread and merge, asymptomatic. Will have Benadryl prn x 5 days. Observe.  01/24/21 wbc 4.5,  Hgb 11.0, plt 104, eosinophils 671, neutrophils 56.6%, Na 137, K 4.3, Bun 17, creat 0.63, eGFR 81   Colostomy status ([place]) Hospitalized 12/02/20-12/15/20 for diverticulitis with distal sigmoid obstruction. Presentation is LLQ abd pain. Had Hartman's procedure, splenic flexure mobilization and end colostomy. Started Vanilla boost 01/20/21   Essential hypertension Blood pressure is controlled, takes Amlodipine Bun/creat10/0.4 12/24/20   Hypomagnesemia Mg 1.8 12/20/20  Vitamin B12 deficiency , takes Vit B, Vit B12 942  Cerebral infarction ([place]) Hx of CVA w/o residual deficit   Hyperlipidemia LDL goal <70  takes Rosuvastatin, LDL 72 12/20/20   Depression, recurrent ([place]) Her mood is stable,  takes Venlafaxine 142m qd  Slow transit constipation  takes MiraLax Colace   Blood loss anemia Post op anemia, takes Fe Hgb 10.0 12/29/20   Hypokalemia supplemented, takes Kcl, serum K 3.5 12/24/20   Allergic rhinitis takes Zyrtec. Started Flonase 01/20/21     Family/ staff Communication: plan of care reviewed with the patient and charge nurse.   Labs/tests ordered: none  Time spend 35 minutes.

## 2021-01-23 NOTE — Assessment & Plan Note (Signed)
Her mood is stable,  takes Venlafaxine 150mg  qd

## 2021-01-23 NOTE — Assessment & Plan Note (Signed)
takes Zyrtec. Started Flonase 01/20/21

## 2021-01-23 NOTE — Assessment & Plan Note (Signed)
,   takes Vit B, Vit B12 942

## 2021-01-23 NOTE — Assessment & Plan Note (Signed)
supplemented, takes Kcl, serum K 3.5 12/24/20

## 2021-01-23 NOTE — Assessment & Plan Note (Signed)
Blood pressure is controlled, takes Amlodipine Bun/creat 10/0.4 12/24/20  

## 2021-01-23 NOTE — Assessment & Plan Note (Signed)
Hx of CVA w/o residual deficit 

## 2021-01-23 NOTE — Assessment & Plan Note (Signed)
Mg 1.8 12/20/20 

## 2021-01-23 NOTE — Assessment & Plan Note (Signed)
Post op anemia, takes Fe Hgb 10.0 12/29/20

## 2021-01-24 DIAGNOSIS — K5792 Diverticulitis of intestine, part unspecified, without perforation or abscess without bleeding: Secondary | ICD-10-CM | POA: Diagnosis not present

## 2021-01-24 DIAGNOSIS — R2681 Unsteadiness on feet: Secondary | ICD-10-CM | POA: Diagnosis not present

## 2021-01-24 DIAGNOSIS — I10 Essential (primary) hypertension: Secondary | ICD-10-CM | POA: Diagnosis not present

## 2021-01-24 DIAGNOSIS — Z933 Colostomy status: Secondary | ICD-10-CM | POA: Diagnosis not present

## 2021-01-24 DIAGNOSIS — R29898 Other symptoms and signs involving the musculoskeletal system: Secondary | ICD-10-CM | POA: Diagnosis not present

## 2021-01-24 DIAGNOSIS — M6281 Muscle weakness (generalized): Secondary | ICD-10-CM | POA: Diagnosis not present

## 2021-01-24 LAB — HEPATIC FUNCTION PANEL
ALT: 12 (ref 7–35)
AST: 14 (ref 13–35)
Alkaline Phosphatase: 74 (ref 25–125)
Bilirubin, Total: 0.3

## 2021-01-24 LAB — COMPREHENSIVE METABOLIC PANEL
Albumin: 3 — AB (ref 3.5–5.0)
Calcium: 9 (ref 8.7–10.7)
GFR calc Af Amer: 94
GFR calc non Af Amer: 81
Globulin: 2.7

## 2021-01-24 LAB — BASIC METABOLIC PANEL
BUN: 17 (ref 4–21)
CO2: 27 — AB (ref 13–22)
Chloride: 102 (ref 99–108)
Creatinine: 0.6 (ref 0.5–1.1)
Glucose: 91
Potassium: 4.3 (ref 3.4–5.3)
Sodium: 137 (ref 137–147)

## 2021-01-24 LAB — CBC AND DIFFERENTIAL
HCT: 36 (ref 36–46)
Hemoglobin: 11 — AB (ref 12.0–16.0)
Neutrophils Absolute: 2547
Platelets: 104 — AB (ref 150–399)
WBC: 4.5

## 2021-01-24 LAB — CBC: RBC: 3.94 (ref 3.87–5.11)

## 2021-01-25 DIAGNOSIS — K5792 Diverticulitis of intestine, part unspecified, without perforation or abscess without bleeding: Secondary | ICD-10-CM | POA: Diagnosis not present

## 2021-01-25 DIAGNOSIS — M6281 Muscle weakness (generalized): Secondary | ICD-10-CM | POA: Diagnosis not present

## 2021-01-25 DIAGNOSIS — R29898 Other symptoms and signs involving the musculoskeletal system: Secondary | ICD-10-CM | POA: Diagnosis not present

## 2021-01-25 DIAGNOSIS — R2681 Unsteadiness on feet: Secondary | ICD-10-CM | POA: Diagnosis not present

## 2021-01-25 DIAGNOSIS — Z933 Colostomy status: Secondary | ICD-10-CM | POA: Diagnosis not present

## 2021-01-27 DIAGNOSIS — R2681 Unsteadiness on feet: Secondary | ICD-10-CM | POA: Diagnosis not present

## 2021-01-27 DIAGNOSIS — K5792 Diverticulitis of intestine, part unspecified, without perforation or abscess without bleeding: Secondary | ICD-10-CM | POA: Diagnosis not present

## 2021-01-27 DIAGNOSIS — Z933 Colostomy status: Secondary | ICD-10-CM | POA: Diagnosis not present

## 2021-01-27 DIAGNOSIS — R29898 Other symptoms and signs involving the musculoskeletal system: Secondary | ICD-10-CM | POA: Diagnosis not present

## 2021-01-27 DIAGNOSIS — M6281 Muscle weakness (generalized): Secondary | ICD-10-CM | POA: Diagnosis not present

## 2021-01-30 DIAGNOSIS — Z933 Colostomy status: Secondary | ICD-10-CM | POA: Diagnosis not present

## 2021-01-30 DIAGNOSIS — K5792 Diverticulitis of intestine, part unspecified, without perforation or abscess without bleeding: Secondary | ICD-10-CM | POA: Diagnosis not present

## 2021-01-30 DIAGNOSIS — R2681 Unsteadiness on feet: Secondary | ICD-10-CM | POA: Diagnosis not present

## 2021-01-30 DIAGNOSIS — R29898 Other symptoms and signs involving the musculoskeletal system: Secondary | ICD-10-CM | POA: Diagnosis not present

## 2021-01-30 DIAGNOSIS — M6281 Muscle weakness (generalized): Secondary | ICD-10-CM | POA: Diagnosis not present

## 2021-01-31 DIAGNOSIS — Z933 Colostomy status: Secondary | ICD-10-CM | POA: Diagnosis not present

## 2021-01-31 DIAGNOSIS — M6281 Muscle weakness (generalized): Secondary | ICD-10-CM | POA: Diagnosis not present

## 2021-01-31 DIAGNOSIS — K5792 Diverticulitis of intestine, part unspecified, without perforation or abscess without bleeding: Secondary | ICD-10-CM | POA: Diagnosis not present

## 2021-01-31 DIAGNOSIS — R2681 Unsteadiness on feet: Secondary | ICD-10-CM | POA: Diagnosis not present

## 2021-01-31 DIAGNOSIS — R29898 Other symptoms and signs involving the musculoskeletal system: Secondary | ICD-10-CM | POA: Diagnosis not present

## 2021-02-01 ENCOUNTER — Non-Acute Institutional Stay: Payer: Medicare Other | Admitting: Nurse Practitioner

## 2021-02-01 ENCOUNTER — Encounter: Payer: Self-pay | Admitting: Nurse Practitioner

## 2021-02-01 DIAGNOSIS — E785 Hyperlipidemia, unspecified: Secondary | ICD-10-CM | POA: Diagnosis not present

## 2021-02-01 DIAGNOSIS — M6281 Muscle weakness (generalized): Secondary | ICD-10-CM | POA: Diagnosis not present

## 2021-02-01 DIAGNOSIS — I63342 Cerebral infarction due to thrombosis of left cerebellar artery: Secondary | ICD-10-CM | POA: Diagnosis not present

## 2021-02-01 DIAGNOSIS — I10 Essential (primary) hypertension: Secondary | ICD-10-CM | POA: Diagnosis not present

## 2021-02-01 DIAGNOSIS — R29898 Other symptoms and signs involving the musculoskeletal system: Secondary | ICD-10-CM | POA: Diagnosis not present

## 2021-02-01 DIAGNOSIS — Z933 Colostomy status: Secondary | ICD-10-CM

## 2021-02-01 DIAGNOSIS — E538 Deficiency of other specified B group vitamins: Secondary | ICD-10-CM | POA: Diagnosis not present

## 2021-02-01 DIAGNOSIS — K5792 Diverticulitis of intestine, part unspecified, without perforation or abscess without bleeding: Secondary | ICD-10-CM | POA: Diagnosis not present

## 2021-02-01 DIAGNOSIS — K5901 Slow transit constipation: Secondary | ICD-10-CM | POA: Diagnosis not present

## 2021-02-01 DIAGNOSIS — H40052 Ocular hypertension, left eye: Secondary | ICD-10-CM

## 2021-02-01 DIAGNOSIS — J302 Other seasonal allergic rhinitis: Secondary | ICD-10-CM

## 2021-02-01 DIAGNOSIS — D500 Iron deficiency anemia secondary to blood loss (chronic): Secondary | ICD-10-CM

## 2021-02-01 DIAGNOSIS — E876 Hypokalemia: Secondary | ICD-10-CM

## 2021-02-01 DIAGNOSIS — F339 Major depressive disorder, recurrent, unspecified: Secondary | ICD-10-CM

## 2021-02-01 DIAGNOSIS — R2681 Unsteadiness on feet: Secondary | ICD-10-CM | POA: Diagnosis not present

## 2021-02-01 NOTE — Assessment & Plan Note (Signed)
takes Vit B, Vit B12 942 

## 2021-02-01 NOTE — Assessment & Plan Note (Signed)
Mg 1.8 12/20/20 

## 2021-02-01 NOTE — Assessment & Plan Note (Signed)
supplemented, takes Kcl, serum K 4.3 01/24/21

## 2021-02-01 NOTE — Assessment & Plan Note (Signed)
takes Rosuvastatin, LDL 72 12/20/20 

## 2021-02-01 NOTE — Assessment & Plan Note (Signed)
takes MiraLax Colace

## 2021-02-01 NOTE — Assessment & Plan Note (Signed)
takes Amlodipine Bun/creat 17/0.6 01/24/21

## 2021-02-01 NOTE — Assessment & Plan Note (Signed)
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 

## 2021-02-01 NOTE — Assessment & Plan Note (Signed)
Uses Latanoprost, f/u ophthalmology.

## 2021-02-01 NOTE — Assessment & Plan Note (Signed)
takes Zyrtec,  Flonase

## 2021-02-01 NOTE — Assessment & Plan Note (Signed)
Hx of CVA w/o residual deficit 

## 2021-02-01 NOTE — Progress Notes (Signed)
Location:   [place] Room Number: [phone] Place of Service:  ALF [phone]) Provider:  Lizbet Cirrincione Otho Darner, NP    Patient Care Team: Virgie Dad, MD as PCP - General (Internal Medicine) Viona Gilmore, [facility] as Pharmacist (Pharmacist)  Extended Emergency Contact Information Primary Emergency Contact: Asa Saunas [place] [employer] Phone: [phone] Relation: Daughter  Code Status:  FULL CODE Goals of care: Advanced Directive information Advanced Directives 02/01/2021  Does Patient Have a Medical Advance Directive? Yes  Type of Paramedic of [place];Living will  Does patient want to make changes to medical advance directive? No - Patient declined  Copy of [place] in Chart? Yes - validated most recent copy scanned in chart (See row information)  Would patient like information on creating a medical advance directive? -     Chief Complaint  Patient presents with  . Medical Management of Chronic Issues    Patient presents today for a medication review.   . Health Maintenance    Discuss need for TD/Tdap vaccine.     HPI:  Pt is a 87 y.o. female seen today for review medications following [facility] stay for therapy.   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 Arthritis   . Depression   . Hyperlipidemia   . Osteopenia   . Stroke ([place])   . Thrombocytopenia ([place])   . Vitamin D deficiency   . Wrist fracture 2002   left   Past Surgical History:  Procedure Laterality Date  . APPENDECTOMY  1967  . CARPAL TUNNEL RELEASE  2008   right  . COLON RESECTION SIGMOID N/A 12/03/2020   Procedure: EXPLORATORY LAPAROTOMY; HARTMAN'S PROCEDURE; SPLEENIC FLEXURE MOBILIZATION;  Surgeon: Leighton Ruff, MD;  Location: [facility];  Service: General;  Laterality: N/A;  . COLOSTOMY N/A 12/03/2020   Procedure: COLOSTOMY;  Surgeon: Leighton Ruff, MD;  Location: [facility];  Service: General;  Laterality: N/A;  . JOINT REPLACEMENT  2002   right total     Allergies  Allergen Reactions  . Bactrim [Sulfamethoxazole-Trimethoprim] Nausea Only  . Ditropan [Oxybutynin]     Made patient feel bad  . Sulfa Antibiotics     Other reaction(s): stomach upset    Allergies as of 02/01/2021      Reactions   Bactrim [sulfamethoxazole-trimethoprim] Nausea Only   Ditropan [oxybutynin]    Made patient feel bad   Sulfa Antibiotics    Other reaction(s): stomach upset      Medication List       Accurate as of Feb 01, 2021  2:08 PM. If you have any questions, ask your nurse or doctor.        acetaminophen 325 MG tablet Commonly known as: TYLENOL Take 2 tablets (650 mg total) by mouth every 6 (six) hours as needed for mild pain or moderate pain.   alum & mag hydroxide-simeth 200-200-20 MG/5ML suspension Commonly known as: MAALOX/MYLANTA Take 30 mLs by mouth every 4 (four) hours as needed for indigestion or heartburn.  amLODipine 2.5 MG tablet Commonly known as: NORVASC Take 2.5 mg by mouth daily.   aspirin 325 MG EC tablet Take 1 tablet (325 mg total) by mouth daily.   Biotin 10 MG Tabs Take 10 mg by mouth daily.   cetirizine 10 MG tablet Commonly known as: ZYRTEC Take 10 mg by mouth at bedtime.   docusate sodium 100 MG capsule Commonly known as:  COLACE Take 1 capsule (100 mg total) by mouth 2 (two) times daily.   feeding supplement (BOOST BREEZE) Liqd Take 237 mLs by mouth 3 (three) times daily between meals.   feeding supplement (PRO-STAT SUGAR FREE 64) Liqd Take 30 mLs by mouth daily.   fluticasone 50 MCG/ACT nasal spray Commonly known as: FLONASE Place 1 spray into both nostrils 2 (two) times daily.   IRON PO Take 325 mg by mouth daily.   latanoprost 0.005 % ophthalmic solution Commonly known as: XALATAN Place 1 drop into the left eye at bedtime.   Magnesium 100 MG Tabs Take 1 tablet by mouth daily.   magnesium hydroxide 400 MG/5ML suspension Commonly known as: MILK OF MAGNESIA Take 30 mLs by mouth daily as needed for mild constipation.   polyethylene glycol 17 g packet Commonly known as: MIRALAX / GLYCOLAX Take 17 g by mouth daily.   potassium chloride SA 20 MEQ tablet Commonly known as: KLOR-CON Take 20 mEq by mouth daily.   rosuvastatin 10 MG tablet Commonly known as: CRESTOR TAKE 1 TABLET ONCE DAILY.   venlafaxine XR 75 MG 24 hr capsule Commonly known as: EFFEXOR-XR TAKE 2 CAPSULES (150MG ) BY MOUTH DAILY.   vitamin B-12 1000 MCG tablet Commonly known as: CYANOCOBALAMIN Take 1,000 mcg by mouth daily.   vitamin C 500 MG tablet Commonly known as: ASCORBIC ACID Take 500 mg by mouth daily.   Vitamin D3 50 MCG (2000 UT) capsule Take 2,000 Units by mouth daily.   ZINC-220 PO Take 220 mg by mouth daily.       Review of Systems  Constitutional: Negative for appetite change, fatigue and fever.  HENT: Positive for congestion and rhinorrhea. Negative for hearing loss.        Improved  Eyes: Negative for visual disturbance.  Respiratory: Negative for cough, shortness of breath and wheezing.   Cardiovascular: Negative for chest pain, palpitations and leg swelling.  Gastrointestinal: Negative for abdominal pain and constipation.  Genitourinary: Positive for frequency. Negative for dysuria and  urgency.  Musculoskeletal: Positive for arthralgias and gait problem.  Skin: Negative for color change.  Neurological: Negative for speech difficulty, weakness and light-headedness.  Psychiatric/Behavioral: Negative for confusion and sleep disturbance. The patient is not nervous/anxious.     Immunization History  Administered Date(s) Administered  . Fluad Quad(high Dose 65+) 06/13/2019  . Influenza Split 06/18/2012  . Influenza Whole 07/11/2010  . Influenza, High Dose Seasonal PF 07/07/2013, 07/14/2014, 06/24/2015, 06/13/2016, 06/04/2017, 06/17/2018  . Influenza-Unspecified 06/22/2020  . Moderna SARS-COV2 Booster Vaccination 07/19/2020  . Moderna Sars-Covid-2 Vaccination 09/14/2019, 10/12/2019  . Pneumococcal Conjugate-13 04/27/2014  . Pneumococcal Polysaccharide-23 10/15/2017  . Td 09/10/2005  . Zoster 11/29/2006   Pertinent  Health Maintenance Due  Topic Date Due  . INFLUENZA VACCINE  04/10/2021  . DEXA SCAN  Completed  . PNA vac Low Risk Adult  Completed   Fall Risk  08/31/2020 10/16/2019 08/04/2019 07/31/2018 06/17/2018  Falls in the past year? 1 0 0 0 No  Comment - - Emmi Telephone Survey: data to providers prior to load [employer] Telephone Survey: data  to providers prior to load -  Number falls in past yr: 1 - - - -  Injury with Fall? 1 - - - -  Comment September 2021 head injury minor - - - -  Risk for fall due to : - Medication side effect - - -  Risk for fall due to: Comment - - - - -  Follow up - Falls evaluation completed;Education provided;Falls prevention discussed - - -   Functional Status Survey:    Vitals:   02/01/21 0833  BP: 128/74  Pulse: 80  Resp: 18  Temp: 98.4 F (36.9 C)  SpO2: 97%  Weight: 140 lb 9.6 oz (63.8 kg)  Height: 5\' 3"  (1.6 m)   Body mass index is 24.91 kg/m. Physical Exam Vitals and nursing note reviewed.  Constitutional:      Appearance: Normal appearance.  HENT:     Head: Normocephalic and atraumatic.     Nose: Congestion and  rhinorrhea present.     Comments: mild    Mouth/Throat:     Mouth: Mucous membranes are moist.  Eyes:     Extraocular Movements: Extraocular movements intact.     Conjunctiva/sclera: Conjunctivae normal.     Pupils: Pupils are equal, round, and reactive to light.  Cardiovascular:     Rate and Rhythm: Normal rate and regular rhythm.     Heart sounds: No murmur heard.   Pulmonary:     Effort: Pulmonary effort is normal.     Breath sounds: No rales.  Abdominal:     General: Bowel sounds are normal.     Palpations: Abdomen is soft.     Tenderness: There is no abdominal tenderness.     Comments: Colostomy  Musculoskeletal:     Cervical back: Normal range of motion and neck supple.     Left lower leg: No edema.     Comments: Trace edema in ankles.   Skin:    General: Skin is warm and dry.     Findings: Rash present.     Comments: Mid abd surgical scar. Colostomy is functioning. Pinkish macular spots spread and merge, started from lower leg up to chest and upper back, not on her face, a few in arms. Non itching.   Neurological:     General: No focal deficit present.     Mental Status: She is alert and oriented to person, place, and time. Mental status is at baseline.     Gait: Gait abnormal.  Psychiatric:        Mood and Affect: Mood normal.        Behavior: Behavior normal.        Thought Content: Thought content normal.        Judgment: Judgment normal.     Labs reviewed: Recent Labs    12/12/20 0325 12/13/20 0430 12/14/20 0410 12/21/20 0000 12/24/20 0000 01/24/21 2359  NA 138 136 141 141 138 137  K 4.5 3.8 3.6 2.7* 3.5 4.3  CL 109 103 105 103 103 102  CO2 25 26 26  28* 29* 27*  GLUCOSE 122* 134* 116*  --   --   --   BUN 6* 9 13 10 10 17   CREATININE 0.34* 0.42* 0.45 0.4* 0.4* 0.6  CALCIUM 8.4* 8.7* 8.9 8.5* 9.1 9.0  MG 2.2 1.8 1.7 1.8  --   --   PHOS 3.4 4.2 4.3  --   --   --    Recent Labs    12/10/20 0552 12/11/20 0405  12/12/20 0325 12/21/20 0000  01/24/21 2359  AST 16 17 17  12* 14  ALT 12 12 12 9 12   ALKPHOS 42 41 45 65 74  BILITOT 0.6 0.5 0.3  --   --   PROT 5.2* 5.2* 5.3*  --   --   ALBUMIN 2.2* 2.2* 2.2* 2.9* 3.0*   Recent Labs    12/09/20 0446 12/12/20 0325 12/14/20 0410 12/21/20 0000 12/29/20 0000 01/24/21 2359  WBC 6.9 7.9 8.8 4.3 5.5 4.5  NEUTROABS 5.1 6.0  --  2,683.00  --  2,547.00  HGB 8.1* 7.4* 7.2* 7.6* 10.0* 11.0*  HCT 25.5* 24.3* 23.6* 26* 33* 36  MCV 88.2 93.1 94.0  --   --   --   PLT 172 144* 170 195 201 104*   Lab Results  Component Value Date   TSH 1.40 12/21/2020   Lab Results  Component Value Date   HGBA1C 5.9 (H) 09/26/2015   Lab Results  Component Value Date   CHOL 136 12/21/2020   HDL 93 (A) 12/21/2020   LDLCALC 72 12/21/2020   LDLDIRECT 147.0 11/17/2012   TRIG 119 12/21/2020   CHOLHDL 3 02/10/2020    Significant Diagnostic Results in last 30 days:  No results found.  Assessment/Plan Elevated IOP, left Uses Latanoprost, f/u ophthalmology.   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, s/p Hartman's procedure, splenic flexure mobilization   Essential hypertension takes Amlodipine Bun/creat 17/0.6 01/24/21   Hypomagnesemia Mg 1.8 12/20/20  Vitamin B12 deficiency takes Vit B, Vit B12 942   Cerebral infarction (HCC) Hx of CVA w/o residual deficit   Hyperlipidemia LDL goal <70 takes Rosuvastatin, LDL 72 12/20/20   Depression, recurrent (HCC) takes Venlafaxine 150mg  qd   Slow transit constipation takes MiraLax Colace   Allergic rhinitis  takes Zyrtec,  Flonase    Hypokalemia supplemented, takes Kcl, serum K 4.3 01/24/21   Blood loss anemia takes Fe Hgb11 01/24/21    Family/ staff Communication: plan of care reviewed with the patient and charge nurse.   Labs/tests ordered:  none  Time spend 40 minutes.

## 2021-02-01 NOTE — Assessment & Plan Note (Signed)
takes Fe Hgb11 01/24/21

## 2021-02-01 NOTE — Assessment & Plan Note (Signed)
takes Venlafaxine 150mg  qd

## 2021-02-02 DIAGNOSIS — R29898 Other symptoms and signs involving the musculoskeletal system: Secondary | ICD-10-CM | POA: Diagnosis not present

## 2021-02-02 DIAGNOSIS — Z933 Colostomy status: Secondary | ICD-10-CM | POA: Diagnosis not present

## 2021-02-02 DIAGNOSIS — K5792 Diverticulitis of intestine, part unspecified, without perforation or abscess without bleeding: Secondary | ICD-10-CM | POA: Diagnosis not present

## 2021-02-02 DIAGNOSIS — R2681 Unsteadiness on feet: Secondary | ICD-10-CM | POA: Diagnosis not present

## 2021-02-02 DIAGNOSIS — M6281 Muscle weakness (generalized): Secondary | ICD-10-CM | POA: Diagnosis not present

## 2021-02-03 DIAGNOSIS — M6281 Muscle weakness (generalized): Secondary | ICD-10-CM | POA: Diagnosis not present

## 2021-02-03 DIAGNOSIS — Z933 Colostomy status: Secondary | ICD-10-CM | POA: Diagnosis not present

## 2021-02-03 DIAGNOSIS — R29898 Other symptoms and signs involving the musculoskeletal system: Secondary | ICD-10-CM | POA: Diagnosis not present

## 2021-02-03 DIAGNOSIS — K5792 Diverticulitis of intestine, part unspecified, without perforation or abscess without bleeding: Secondary | ICD-10-CM | POA: Diagnosis not present

## 2021-02-03 DIAGNOSIS — R2681 Unsteadiness on feet: Secondary | ICD-10-CM | POA: Diagnosis not present

## 2021-02-06 DIAGNOSIS — K5792 Diverticulitis of intestine, part unspecified, without perforation or abscess without bleeding: Secondary | ICD-10-CM | POA: Diagnosis not present

## 2021-02-06 DIAGNOSIS — R29898 Other symptoms and signs involving the musculoskeletal system: Secondary | ICD-10-CM | POA: Diagnosis not present

## 2021-02-06 DIAGNOSIS — R2681 Unsteadiness on feet: Secondary | ICD-10-CM | POA: Diagnosis not present

## 2021-02-06 DIAGNOSIS — Z933 Colostomy status: Secondary | ICD-10-CM | POA: Diagnosis not present

## 2021-02-06 DIAGNOSIS — M6281 Muscle weakness (generalized): Secondary | ICD-10-CM | POA: Diagnosis not present

## 2021-02-07 DIAGNOSIS — Z933 Colostomy status: Secondary | ICD-10-CM | POA: Diagnosis not present

## 2021-02-07 DIAGNOSIS — R29898 Other symptoms and signs involving the musculoskeletal system: Secondary | ICD-10-CM | POA: Diagnosis not present

## 2021-02-07 DIAGNOSIS — Z23 Encounter for immunization: Secondary | ICD-10-CM | POA: Diagnosis not present

## 2021-02-07 DIAGNOSIS — K5792 Diverticulitis of intestine, part unspecified, without perforation or abscess without bleeding: Secondary | ICD-10-CM | POA: Diagnosis not present

## 2021-02-07 DIAGNOSIS — M6281 Muscle weakness (generalized): Secondary | ICD-10-CM | POA: Diagnosis not present

## 2021-02-07 DIAGNOSIS — R2681 Unsteadiness on feet: Secondary | ICD-10-CM | POA: Diagnosis not present

## 2021-02-08 DIAGNOSIS — R2681 Unsteadiness on feet: Secondary | ICD-10-CM | POA: Diagnosis not present

## 2021-02-08 DIAGNOSIS — M6281 Muscle weakness (generalized): Secondary | ICD-10-CM | POA: Diagnosis not present

## 2021-02-08 DIAGNOSIS — Z933 Colostomy status: Secondary | ICD-10-CM | POA: Diagnosis not present

## 2021-02-08 DIAGNOSIS — R29898 Other symptoms and signs involving the musculoskeletal system: Secondary | ICD-10-CM | POA: Diagnosis not present

## 2021-02-08 DIAGNOSIS — K5792 Diverticulitis of intestine, part unspecified, without perforation or abscess without bleeding: Secondary | ICD-10-CM | POA: Diagnosis not present

## 2021-02-10 DIAGNOSIS — M6281 Muscle weakness (generalized): Secondary | ICD-10-CM | POA: Diagnosis not present

## 2021-02-10 DIAGNOSIS — R2681 Unsteadiness on feet: Secondary | ICD-10-CM | POA: Diagnosis not present

## 2021-02-10 DIAGNOSIS — R29898 Other symptoms and signs involving the musculoskeletal system: Secondary | ICD-10-CM | POA: Diagnosis not present

## 2021-02-10 DIAGNOSIS — K5792 Diverticulitis of intestine, part unspecified, without perforation or abscess without bleeding: Secondary | ICD-10-CM | POA: Diagnosis not present

## 2021-02-10 DIAGNOSIS — Z933 Colostomy status: Secondary | ICD-10-CM | POA: Diagnosis not present

## 2021-02-13 DIAGNOSIS — Z933 Colostomy status: Secondary | ICD-10-CM | POA: Diagnosis not present

## 2021-02-13 DIAGNOSIS — M6281 Muscle weakness (generalized): Secondary | ICD-10-CM | POA: Diagnosis not present

## 2021-02-13 DIAGNOSIS — R2681 Unsteadiness on feet: Secondary | ICD-10-CM | POA: Diagnosis not present

## 2021-02-13 DIAGNOSIS — K5792 Diverticulitis of intestine, part unspecified, without perforation or abscess without bleeding: Secondary | ICD-10-CM | POA: Diagnosis not present

## 2021-02-13 DIAGNOSIS — R29898 Other symptoms and signs involving the musculoskeletal system: Secondary | ICD-10-CM | POA: Diagnosis not present

## 2021-02-14 DIAGNOSIS — R29898 Other symptoms and signs involving the musculoskeletal system: Secondary | ICD-10-CM | POA: Diagnosis not present

## 2021-02-14 DIAGNOSIS — Z933 Colostomy status: Secondary | ICD-10-CM | POA: Diagnosis not present

## 2021-02-14 DIAGNOSIS — R2681 Unsteadiness on feet: Secondary | ICD-10-CM | POA: Diagnosis not present

## 2021-02-14 DIAGNOSIS — K5792 Diverticulitis of intestine, part unspecified, without perforation or abscess without bleeding: Secondary | ICD-10-CM | POA: Diagnosis not present

## 2021-02-14 DIAGNOSIS — M6281 Muscle weakness (generalized): Secondary | ICD-10-CM | POA: Diagnosis not present

## 2021-02-15 DIAGNOSIS — M6281 Muscle weakness (generalized): Secondary | ICD-10-CM | POA: Diagnosis not present

## 2021-02-15 DIAGNOSIS — R29898 Other symptoms and signs involving the musculoskeletal system: Secondary | ICD-10-CM | POA: Diagnosis not present

## 2021-02-15 DIAGNOSIS — K5792 Diverticulitis of intestine, part unspecified, without perforation or abscess without bleeding: Secondary | ICD-10-CM | POA: Diagnosis not present

## 2021-02-15 DIAGNOSIS — Z933 Colostomy status: Secondary | ICD-10-CM | POA: Diagnosis not present

## 2021-02-15 DIAGNOSIS — R2681 Unsteadiness on feet: Secondary | ICD-10-CM | POA: Diagnosis not present

## 2021-02-17 DIAGNOSIS — Z933 Colostomy status: Secondary | ICD-10-CM | POA: Diagnosis not present

## 2021-02-17 DIAGNOSIS — R29898 Other symptoms and signs involving the musculoskeletal system: Secondary | ICD-10-CM | POA: Diagnosis not present

## 2021-02-17 DIAGNOSIS — R2681 Unsteadiness on feet: Secondary | ICD-10-CM | POA: Diagnosis not present

## 2021-02-17 DIAGNOSIS — K5792 Diverticulitis of intestine, part unspecified, without perforation or abscess without bleeding: Secondary | ICD-10-CM | POA: Diagnosis not present

## 2021-02-17 DIAGNOSIS — M6281 Muscle weakness (generalized): Secondary | ICD-10-CM | POA: Diagnosis not present

## 2021-02-20 DIAGNOSIS — Z933 Colostomy status: Secondary | ICD-10-CM | POA: Diagnosis not present

## 2021-02-20 DIAGNOSIS — R2681 Unsteadiness on feet: Secondary | ICD-10-CM | POA: Diagnosis not present

## 2021-02-20 DIAGNOSIS — K5792 Diverticulitis of intestine, part unspecified, without perforation or abscess without bleeding: Secondary | ICD-10-CM | POA: Diagnosis not present

## 2021-02-20 DIAGNOSIS — R29898 Other symptoms and signs involving the musculoskeletal system: Secondary | ICD-10-CM | POA: Diagnosis not present

## 2021-02-20 DIAGNOSIS — M6281 Muscle weakness (generalized): Secondary | ICD-10-CM | POA: Diagnosis not present

## 2021-02-28 ENCOUNTER — Encounter: Payer: Self-pay | Admitting: Family Medicine

## 2021-02-28 DIAGNOSIS — H903 Sensorineural hearing loss, bilateral: Secondary | ICD-10-CM | POA: Diagnosis not present

## 2021-03-07 ENCOUNTER — Non-Acute Institutional Stay: Payer: Medicare Other | Admitting: Internal Medicine

## 2021-03-07 ENCOUNTER — Encounter: Payer: Self-pay | Admitting: Internal Medicine

## 2021-03-07 DIAGNOSIS — Z933 Colostomy status: Secondary | ICD-10-CM

## 2021-03-07 DIAGNOSIS — E785 Hyperlipidemia, unspecified: Secondary | ICD-10-CM | POA: Diagnosis not present

## 2021-03-07 DIAGNOSIS — E538 Deficiency of other specified B group vitamins: Secondary | ICD-10-CM | POA: Diagnosis not present

## 2021-03-07 DIAGNOSIS — J302 Other seasonal allergic rhinitis: Secondary | ICD-10-CM | POA: Diagnosis not present

## 2021-03-07 DIAGNOSIS — F339 Major depressive disorder, recurrent, unspecified: Secondary | ICD-10-CM

## 2021-03-07 DIAGNOSIS — I63342 Cerebral infarction due to thrombosis of left cerebellar artery: Secondary | ICD-10-CM | POA: Diagnosis not present

## 2021-03-07 DIAGNOSIS — I10 Essential (primary) hypertension: Secondary | ICD-10-CM | POA: Diagnosis not present

## 2021-03-07 DIAGNOSIS — D500 Iron deficiency anemia secondary to blood loss (chronic): Secondary | ICD-10-CM

## 2021-03-07 NOTE — Progress Notes (Signed)
Location:   [place] Room Number: 629 Place of Service:  ALF [phone]) Provider:  Veleta Miners MD  Virgie Dad, MD  Patient Care Team: Virgie Dad, MD as PCP - General (Internal Medicine) Viona Gilmore, [facility] as Pharmacist (Pharmacist)  Extended Emergency Contact Information Primary Emergency Contact: [place] [employer] Phone: [phone] Relation: Daughter  Code Status:  Full Code Goals of care: Advanced Directive information Advanced Directives 03/07/2021  Does Patient Have a Medical Advance Directive? Yes  Type of Paramedic of [place];Living will  Does patient want to make changes to medical advance directive? No - Patient declined  Copy of [place] in Chart? Yes - validated most recent copy scanned in chart (See row information)  Would patient like information on creating a medical advance directive? -     Chief Complaint  Patient presents with   Medical Management of Chronic Issues   Health Maintenance    Shingrix, TDAP, #4 covid booster    HPI:  Pt is a 87 y.o. female seen today for medical management of chronic diseases.     Admitted in the hospital from 3/25-4/7 for diverticulitis with distal sigmoid obstruction Underwent exploratory laparotomy with Hartmann procedure and end colostomy   Patient has a history of hypertension, hyperlipidemia, depression, history of CVA  Now in AL  Doing well weigh ti stable No Nausea. No Abdominal Pain Her only issue is Colostomy management and discomfort around her Ostomy She is also wants Concerned about her Diverticulosis Did see Colostomy Nurse but wants more Support Her Daughter is 4 hours away so does not have Support here Walks with her walker and no falls  Past Surgical History:  Procedure Laterality Date   [place]  2008   right   COLON RESECTION SIGMOID N/A 12/03/2020    Procedure: EXPLORATORY LAPAROTOMY; HARTMAN'S PROCEDURE; SPLEENIC FLEXURE MOBILIZATION;  Surgeon: Leighton Ruff, MD;  [facility];  Service: General;  Laterality: N/A;   COLOSTOMY N/A 12/03/2020   Procedure: COLOSTOMY;  Surgeon: Leighton Ruff, MD;  Location: [facility];  Service: General;  Laterality: N/A;   JOINT REPLACEMENT  2002   right total     Allergies  Allergen Reactions   Bactrim [Sulfamethoxazole-Trimethoprim] Nausea Only   Ditropan [Oxybutynin]     Made patient feel bad   Sulfa Antibiotics     Other reaction(s): stomach upset    Allergies as of 03/07/2021       Reactions   Bactrim [sulfamethoxazole-trimethoprim] Nausea Only   Ditropan [oxybutynin]    Made patient feel bad   Sulfa Antibiotics    Other reaction(s): stomach upset        Medication List        Accurate as of March 07, 2021  3:04 PM. If you have any questions, ask your nurse or doctor.          STOP taking these medications    vitamin C 500 MG tablet Commonly known as: ASCORBIC ACID Stopped by: Virgie Dad, MD   ZINC-220 PO Stopped by: Virgie Dad, MD       TAKE these medications    acetaminophen 325 MG tablet Commonly known as: TYLENOL Take 2 tablets (650 mg total) by mouth every 6 (six) hours as needed for mild pain or moderate pain.   alum & mag hydroxide-simeth 200-200-20 MG/5ML suspension Commonly known as: MAALOX/MYLANTA Take 30 mLs by  mouth every 4 (four) hours as needed for indigestion or heartburn.   amLODipine 2.5 MG tablet Commonly known as: NORVASC Take 2.5 mg by mouth daily.   aspirin 325 MG EC tablet Take 1 tablet (325 mg total) by mouth daily.   Biotin 10 MG Tabs Take 10 mg by mouth daily.   cetirizine 10 MG tablet Commonly known as: ZYRTEC Take 10 mg by mouth at bedtime.   CHELATED MAGNESIUM PO Take 300 mg by mouth daily. What changed: Another medication with the same name was removed. Continue taking this medication, and follow the directions you  see here. Changed by: Virgie Dad, MD   docusate sodium 100 MG capsule Commonly known as: COLACE Take 1 capsule (100 mg total) by mouth 2 (two) times daily.   feeding supplement (BOOST BREEZE) Liqd Take 237 mLs by mouth 3 (three) times daily between meals.   feeding supplement (PRO-STAT SUGAR FREE 64) Liqd Take 30 mLs by mouth daily.   fluticasone 50 MCG/ACT nasal spray Commonly known as: FLONASE Place 1 spray into both nostrils 2 (two) times daily.   IRON PO Take 325 mg by mouth daily.   latanoprost 0.005 % ophthalmic solution Commonly known as: XALATAN Place 1 drop into the left eye at bedtime.   magnesium hydroxide 400 MG/5ML suspension Commonly known as: MILK OF MAGNESIA Take 30 mLs by mouth daily as needed for mild constipation.   polyethylene glycol 17 g packet Commonly known as: MIRALAX / GLYCOLAX Take 17 g by mouth daily.   potassium chloride SA 20 MEQ tablet Commonly known as: KLOR-CON Take 20 mEq by mouth daily.   rosuvastatin 10 MG tablet Commonly known as: CRESTOR TAKE 1 TABLET ONCE DAILY.   venlafaxine XR 75 MG 24 hr capsule Commonly known as: EFFEXOR-XR TAKE 2 CAPSULES (150MG ) BY MOUTH DAILY.   vitamin B-12 1000 MCG tablet Commonly known as: CYANOCOBALAMIN Take 1,000 mcg by mouth daily.   Vitamin D3 50 MCG (2000 UT) capsule Take 2,000 Units by mouth daily.        Review of Systems Review of Systems  Constitutional: Negative for activity change, appetite change, chills, diaphoresis, fatigue and fever.  HENT: Negative for mouth sores, postnasal drip, rhinorrhea, sinus pain and sore throat.   Respiratory: Negative for apnea, cough, chest tightness, shortness of breath and wheezing.   Cardiovascular: Negative for chest pain, palpitations and leg swelling.  Gastrointestinal: Negative for abdominal distention, abdominal pain, constipation, diarrhea, nausea and vomiting.  Genitourinary: Negative for dysuria and frequency.  Musculoskeletal:  Negative for arthralgias, joint swelling and myalgias.  Skin: Negative for rash.  Neurological: Negative for dizziness, syncope, weakness, light-headedness and numbness.  Psychiatric/Behavioral: Negative for behavioral problems, confusion and sleep disturbance.    Immunization History  Administered Date(s) Administered   Fluad Quad(high Dose 65+) 06/13/2019   Influenza Split 06/18/2012   Influenza Whole 07/11/2010   Influenza, High Dose Seasonal PF 07/07/2013, 07/14/2014, 06/24/2015, 06/13/2016, 06/04/2017, 06/17/2018   Influenza-Unspecified 06/22/2020   Moderna SARS-COV2 Booster Vaccination 07/19/2020, 02/07/2021   Moderna Sars-Covid-2 Vaccination 09/14/2019, 10/12/2019   Pneumococcal Conjugate-13 04/27/2014   Pneumococcal Polysaccharide-23 10/15/2017   Td 09/10/2005   Zoster, Live 11/29/2006   Pertinent  Health Maintenance Due  Topic Date Due   INFLUENZA VACCINE  04/10/2021   DEXA SCAN  Completed   PNA vac Low Risk Adult  Completed   Fall Risk  08/31/2020 10/16/2019 08/04/2019 07/31/2018 06/17/2018  Falls in the past year? 1 0 0 0 No  Comment - -  [facility]mmi Telephone Survey: data to providers prior to load [facility] Telephone Survey: data to providers prior to load -  Number falls in past yr: 1 - - - -  Injury with Fall? 1 - - - -  Comment September 2021 head injury minor - - - -  Risk for fall due to : - Medication side effect - - -  Risk for fall due to: Comment - - - - -  Follow up - Falls evaluation completed;Education provided;Falls prevention discussed - - -   Functional Status Survey:    Vitals:   03/07/21 1455  BP: 140/84  Pulse: 90  Resp: 20  Temp: 98.3 F (36.8 C)  SpO2: 96%  Weight: 133 lb 3.2 oz (60.4 kg)  Height: 5\' 3"  (1.6 m)   Body mass index is 23.6 kg/m. Physical Exam Constitutional: Oriented to person, place, and time. Well-developed and well-nourished.  HENT:  Head: Normocephalic.  Mouth/Throat: Oropharynx is clear and moist.  Eyes: Pupils are equal,  round, and reactive to light.  Neck: Neck supple.  Cardiovascular: Normal rate and normal heart sounds.  No murmur heard. Pulmonary/Chest: Effort normal and breath sounds normal. No respiratory distress. No wheezes. She has no rales.  Abdominal: Soft. Bowel sounds are normal. No distension. There is no tenderness. There is no rebound. Colostomy bag in place Musculoskeletal: No edema.  Lymphadenopathy: none Neurological: Alert and oriented to person, place, and time.  Skin: Skin is warm and dry.  Psychiatric: Normal mood and affect. Behavior is normal. Thought content normal.   Labs reviewed: Recent Labs    12/12/20 0325 12/13/20 0430 12/14/20 0410 12/21/20 0000 12/24/20 0000 01/24/21 2359  NA 138 136 141 141 138 137  K 4.5 3.8 3.6 2.7* 3.5 4.3  CL 109 103 105 103 103 102  CO2 25 26 26  28* 29* 27*  GLUCOSE 122* 134* 116*  --   --   --   BUN 6* 9 13 10 10 17   CREATININE 0.34* 0.42* 0.45 0.4* 0.4* 0.6  CALCIUM 8.4* 8.7* 8.9 8.5* 9.1 9.0  MG 2.2 1.8 1.7 1.8  --   --   PHOS 3.4 4.2 4.3  --   --   --    Recent Labs    12/10/20 0552 12/11/20 0405 12/12/20 0325 12/21/20 0000 01/24/21 2359  AST 16 17 17  12* 14  ALT 12 12 12 9 12   ALKPHOS 42 41 45 65 74  BILITOT 0.6 0.5 0.3  --   --   PROT 5.2* 5.2* 5.3*  --   --   ALBUMIN 2.2* 2.2* 2.2* 2.9* 3.0*   Recent Labs    12/09/20 0446 12/12/20 0325 12/14/20 0410 12/21/20 0000 12/29/20 0000 01/24/21 2359  WBC 6.9 7.9 8.8 4.3 5.5 4.5  NEUTROABS 5.1 6.0  --  2,683.00  --  2,547.00  HGB 8.1* 7.4* 7.2* 7.6* 10.0* 11.0*  HCT 25.5* 24.3* 23.6* 26* 33* 36  MCV 88.2 93.1 94.0  --   --   --   PLT 172 144* 170 195 201 104*   Lab Results  Component Value Date   TSH 1.40 12/21/2020   Lab Results  Component Value Date   HGBA1C 5.9 (H) 09/26/2015   Lab Results  Component Value Date   CHOL 136 12/21/2020   HDL 93 (A) 12/21/2020   LDLCALC 72 12/21/2020   LDLDIRECT 147.0 11/17/2012   TRIG 119 12/21/2020   CHOLHDL 3 02/10/2020     Significant Diagnostic Results in last  30 days:  No results found.  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

## 2021-03-18 DIAGNOSIS — K5793 Diverticulitis of intestine, part unspecified, without perforation or abscess with bleeding: Secondary | ICD-10-CM | POA: Diagnosis not present

## 2021-03-18 DIAGNOSIS — H9190 Unspecified hearing loss, unspecified ear: Secondary | ICD-10-CM | POA: Diagnosis not present

## 2021-03-18 DIAGNOSIS — I10 Essential (primary) hypertension: Secondary | ICD-10-CM | POA: Diagnosis not present

## 2021-03-18 DIAGNOSIS — Z8673 Personal history of transient ischemic attack (TIA), and cerebral infarction without residual deficits: Secondary | ICD-10-CM | POA: Diagnosis not present

## 2021-03-18 DIAGNOSIS — Z9049 Acquired absence of other specified parts of digestive tract: Secondary | ICD-10-CM | POA: Diagnosis not present

## 2021-03-18 DIAGNOSIS — J302 Other seasonal allergic rhinitis: Secondary | ICD-10-CM | POA: Diagnosis not present

## 2021-03-18 DIAGNOSIS — E538 Deficiency of other specified B group vitamins: Secondary | ICD-10-CM | POA: Diagnosis not present

## 2021-03-18 DIAGNOSIS — F339 Major depressive disorder, recurrent, unspecified: Secondary | ICD-10-CM | POA: Diagnosis not present

## 2021-03-18 DIAGNOSIS — E785 Hyperlipidemia, unspecified: Secondary | ICD-10-CM | POA: Diagnosis not present

## 2021-03-18 DIAGNOSIS — Z433 Encounter for attention to colostomy: Secondary | ICD-10-CM | POA: Diagnosis not present

## 2021-03-18 DIAGNOSIS — F32A Depression, unspecified: Secondary | ICD-10-CM | POA: Diagnosis not present

## 2021-03-18 DIAGNOSIS — Z9181 History of falling: Secondary | ICD-10-CM | POA: Diagnosis not present

## 2021-03-18 DIAGNOSIS — D500 Iron deficiency anemia secondary to blood loss (chronic): Secondary | ICD-10-CM | POA: Diagnosis not present

## 2021-03-19 DIAGNOSIS — Z20822 Contact with and (suspected) exposure to covid-19: Secondary | ICD-10-CM | POA: Diagnosis not present

## 2021-03-20 ENCOUNTER — Telehealth: Payer: Self-pay | Admitting: *Deleted

## 2021-03-20 NOTE — Telephone Encounter (Signed)
LM on secure voicemail with ManXie's Callback number and informed nurse that she would need to call her for verbal orders.      Mast, Man X, NP  You 8 minutes ago (3:33 PM)   The home health nurse can call me to get the verbal order. Thanks.

## 2021-03-20 NOTE — Telephone Encounter (Signed)
Sara Logan with [facility] requesting Verbal orders for Skilled Nursing [id number].   Please Advise.     FYI: Patient is not taking Cetirizine,Malox, Colace,Miralax or MOM.

## 2021-03-23 DIAGNOSIS — E785 Hyperlipidemia, unspecified: Secondary | ICD-10-CM | POA: Diagnosis not present

## 2021-03-23 DIAGNOSIS — Z433 Encounter for attention to colostomy: Secondary | ICD-10-CM | POA: Diagnosis not present

## 2021-03-23 DIAGNOSIS — I10 Essential (primary) hypertension: Secondary | ICD-10-CM | POA: Diagnosis not present

## 2021-03-23 DIAGNOSIS — F32A Depression, unspecified: Secondary | ICD-10-CM | POA: Diagnosis not present

## 2021-03-23 DIAGNOSIS — E538 Deficiency of other specified B group vitamins: Secondary | ICD-10-CM | POA: Diagnosis not present

## 2021-03-23 DIAGNOSIS — K5793 Diverticulitis of intestine, part unspecified, without perforation or abscess with bleeding: Secondary | ICD-10-CM | POA: Diagnosis not present

## 2021-03-29 DIAGNOSIS — E785 Hyperlipidemia, unspecified: Secondary | ICD-10-CM | POA: Diagnosis not present

## 2021-03-29 DIAGNOSIS — Z433 Encounter for attention to colostomy: Secondary | ICD-10-CM | POA: Diagnosis not present

## 2021-03-29 DIAGNOSIS — I10 Essential (primary) hypertension: Secondary | ICD-10-CM | POA: Diagnosis not present

## 2021-03-29 DIAGNOSIS — K5793 Diverticulitis of intestine, part unspecified, without perforation or abscess with bleeding: Secondary | ICD-10-CM | POA: Diagnosis not present

## 2021-03-29 DIAGNOSIS — F32A Depression, unspecified: Secondary | ICD-10-CM | POA: Diagnosis not present

## 2021-03-29 DIAGNOSIS — E538 Deficiency of other specified B group vitamins: Secondary | ICD-10-CM | POA: Diagnosis not present

## 2021-04-04 DIAGNOSIS — E785 Hyperlipidemia, unspecified: Secondary | ICD-10-CM | POA: Diagnosis not present

## 2021-04-04 DIAGNOSIS — K5793 Diverticulitis of intestine, part unspecified, without perforation or abscess with bleeding: Secondary | ICD-10-CM | POA: Diagnosis not present

## 2021-04-04 DIAGNOSIS — F329 Major depressive disorder, single episode, unspecified: Secondary | ICD-10-CM | POA: Diagnosis not present

## 2021-04-04 DIAGNOSIS — E538 Deficiency of other specified B group vitamins: Secondary | ICD-10-CM | POA: Diagnosis not present

## 2021-04-04 DIAGNOSIS — I10 Essential (primary) hypertension: Secondary | ICD-10-CM | POA: Diagnosis not present

## 2021-04-04 DIAGNOSIS — F32A Depression, unspecified: Secondary | ICD-10-CM | POA: Diagnosis not present

## 2021-04-04 DIAGNOSIS — Z433 Encounter for attention to colostomy: Secondary | ICD-10-CM | POA: Diagnosis not present

## 2021-04-11 DIAGNOSIS — F32A Depression, unspecified: Secondary | ICD-10-CM | POA: Diagnosis not present

## 2021-04-11 DIAGNOSIS — K5793 Diverticulitis of intestine, part unspecified, without perforation or abscess with bleeding: Secondary | ICD-10-CM | POA: Diagnosis not present

## 2021-04-11 DIAGNOSIS — E538 Deficiency of other specified B group vitamins: Secondary | ICD-10-CM | POA: Diagnosis not present

## 2021-04-11 DIAGNOSIS — I10 Essential (primary) hypertension: Secondary | ICD-10-CM | POA: Diagnosis not present

## 2021-04-11 DIAGNOSIS — Z433 Encounter for attention to colostomy: Secondary | ICD-10-CM | POA: Diagnosis not present

## 2021-04-11 DIAGNOSIS — E785 Hyperlipidemia, unspecified: Secondary | ICD-10-CM | POA: Diagnosis not present

## 2021-04-12 DIAGNOSIS — L650 Telogen effluvium: Secondary | ICD-10-CM | POA: Diagnosis not present

## 2021-04-12 DIAGNOSIS — D692 Other nonthrombocytopenic purpura: Secondary | ICD-10-CM | POA: Diagnosis not present

## 2021-04-12 DIAGNOSIS — L821 Other seborrheic keratosis: Secondary | ICD-10-CM | POA: Diagnosis not present

## 2021-04-12 DIAGNOSIS — L814 Other melanin hyperpigmentation: Secondary | ICD-10-CM | POA: Diagnosis not present

## 2021-04-12 DIAGNOSIS — L570 Actinic keratosis: Secondary | ICD-10-CM | POA: Diagnosis not present

## 2021-04-17 DIAGNOSIS — Z9049 Acquired absence of other specified parts of digestive tract: Secondary | ICD-10-CM | POA: Diagnosis not present

## 2021-04-17 DIAGNOSIS — Z433 Encounter for attention to colostomy: Secondary | ICD-10-CM | POA: Diagnosis not present

## 2021-04-17 DIAGNOSIS — E785 Hyperlipidemia, unspecified: Secondary | ICD-10-CM | POA: Diagnosis not present

## 2021-04-17 DIAGNOSIS — E538 Deficiency of other specified B group vitamins: Secondary | ICD-10-CM | POA: Diagnosis not present

## 2021-04-17 DIAGNOSIS — F32A Depression, unspecified: Secondary | ICD-10-CM | POA: Diagnosis not present

## 2021-04-17 DIAGNOSIS — K5793 Diverticulitis of intestine, part unspecified, without perforation or abscess with bleeding: Secondary | ICD-10-CM | POA: Diagnosis not present

## 2021-04-17 DIAGNOSIS — I10 Essential (primary) hypertension: Secondary | ICD-10-CM | POA: Diagnosis not present

## 2021-04-17 DIAGNOSIS — Z9181 History of falling: Secondary | ICD-10-CM | POA: Diagnosis not present

## 2021-04-17 DIAGNOSIS — Z8673 Personal history of transient ischemic attack (TIA), and cerebral infarction without residual deficits: Secondary | ICD-10-CM | POA: Diagnosis not present

## 2021-04-17 DIAGNOSIS — J302 Other seasonal allergic rhinitis: Secondary | ICD-10-CM | POA: Diagnosis not present

## 2021-04-17 DIAGNOSIS — D500 Iron deficiency anemia secondary to blood loss (chronic): Secondary | ICD-10-CM | POA: Diagnosis not present

## 2021-04-25 DIAGNOSIS — I10 Essential (primary) hypertension: Secondary | ICD-10-CM | POA: Diagnosis not present

## 2021-04-25 DIAGNOSIS — F32A Depression, unspecified: Secondary | ICD-10-CM | POA: Diagnosis not present

## 2021-04-25 DIAGNOSIS — E538 Deficiency of other specified B group vitamins: Secondary | ICD-10-CM | POA: Diagnosis not present

## 2021-04-25 DIAGNOSIS — K5793 Diverticulitis of intestine, part unspecified, without perforation or abscess with bleeding: Secondary | ICD-10-CM | POA: Diagnosis not present

## 2021-04-25 DIAGNOSIS — Z433 Encounter for attention to colostomy: Secondary | ICD-10-CM | POA: Diagnosis not present

## 2021-04-25 DIAGNOSIS — E785 Hyperlipidemia, unspecified: Secondary | ICD-10-CM | POA: Diagnosis not present

## 2021-05-02 DIAGNOSIS — E538 Deficiency of other specified B group vitamins: Secondary | ICD-10-CM | POA: Diagnosis not present

## 2021-05-02 DIAGNOSIS — K5793 Diverticulitis of intestine, part unspecified, without perforation or abscess with bleeding: Secondary | ICD-10-CM | POA: Diagnosis not present

## 2021-05-02 DIAGNOSIS — Z433 Encounter for attention to colostomy: Secondary | ICD-10-CM | POA: Diagnosis not present

## 2021-05-02 DIAGNOSIS — E785 Hyperlipidemia, unspecified: Secondary | ICD-10-CM | POA: Diagnosis not present

## 2021-05-02 DIAGNOSIS — I10 Essential (primary) hypertension: Secondary | ICD-10-CM | POA: Diagnosis not present

## 2021-05-02 DIAGNOSIS — F32A Depression, unspecified: Secondary | ICD-10-CM | POA: Diagnosis not present

## 2021-05-16 DIAGNOSIS — E785 Hyperlipidemia, unspecified: Secondary | ICD-10-CM | POA: Diagnosis not present

## 2021-05-16 DIAGNOSIS — Z433 Encounter for attention to colostomy: Secondary | ICD-10-CM | POA: Diagnosis not present

## 2021-05-16 DIAGNOSIS — I10 Essential (primary) hypertension: Secondary | ICD-10-CM | POA: Diagnosis not present

## 2021-05-16 DIAGNOSIS — F32A Depression, unspecified: Secondary | ICD-10-CM | POA: Diagnosis not present

## 2021-05-16 DIAGNOSIS — K5793 Diverticulitis of intestine, part unspecified, without perforation or abscess with bleeding: Secondary | ICD-10-CM | POA: Diagnosis not present

## 2021-05-16 DIAGNOSIS — E538 Deficiency of other specified B group vitamins: Secondary | ICD-10-CM | POA: Diagnosis not present

## 2021-05-17 DIAGNOSIS — E538 Deficiency of other specified B group vitamins: Secondary | ICD-10-CM | POA: Diagnosis not present

## 2021-05-17 DIAGNOSIS — D500 Iron deficiency anemia secondary to blood loss (chronic): Secondary | ICD-10-CM | POA: Diagnosis not present

## 2021-05-17 DIAGNOSIS — I10 Essential (primary) hypertension: Secondary | ICD-10-CM | POA: Diagnosis not present

## 2021-05-17 DIAGNOSIS — E785 Hyperlipidemia, unspecified: Secondary | ICD-10-CM | POA: Diagnosis not present

## 2021-05-17 DIAGNOSIS — Z9049 Acquired absence of other specified parts of digestive tract: Secondary | ICD-10-CM | POA: Diagnosis not present

## 2021-05-17 DIAGNOSIS — Z9181 History of falling: Secondary | ICD-10-CM | POA: Diagnosis not present

## 2021-05-17 DIAGNOSIS — Z433 Encounter for attention to colostomy: Secondary | ICD-10-CM | POA: Diagnosis not present

## 2021-05-17 DIAGNOSIS — F339 Major depressive disorder, recurrent, unspecified: Secondary | ICD-10-CM | POA: Diagnosis not present

## 2021-05-17 DIAGNOSIS — K5793 Diverticulitis of intestine, part unspecified, without perforation or abscess with bleeding: Secondary | ICD-10-CM | POA: Diagnosis not present

## 2021-05-17 DIAGNOSIS — H9190 Unspecified hearing loss, unspecified ear: Secondary | ICD-10-CM | POA: Diagnosis not present

## 2021-05-17 DIAGNOSIS — Z8673 Personal history of transient ischemic attack (TIA), and cerebral infarction without residual deficits: Secondary | ICD-10-CM | POA: Diagnosis not present

## 2021-05-17 DIAGNOSIS — J302 Other seasonal allergic rhinitis: Secondary | ICD-10-CM | POA: Diagnosis not present

## 2021-05-23 DIAGNOSIS — K5793 Diverticulitis of intestine, part unspecified, without perforation or abscess with bleeding: Secondary | ICD-10-CM | POA: Diagnosis not present

## 2021-05-23 DIAGNOSIS — E785 Hyperlipidemia, unspecified: Secondary | ICD-10-CM | POA: Diagnosis not present

## 2021-05-23 DIAGNOSIS — E538 Deficiency of other specified B group vitamins: Secondary | ICD-10-CM | POA: Diagnosis not present

## 2021-05-23 DIAGNOSIS — D500 Iron deficiency anemia secondary to blood loss (chronic): Secondary | ICD-10-CM | POA: Diagnosis not present

## 2021-05-23 DIAGNOSIS — I10 Essential (primary) hypertension: Secondary | ICD-10-CM | POA: Diagnosis not present

## 2021-05-23 DIAGNOSIS — F339 Major depressive disorder, recurrent, unspecified: Secondary | ICD-10-CM | POA: Diagnosis not present

## 2021-05-25 DIAGNOSIS — F329 Major depressive disorder, single episode, unspecified: Secondary | ICD-10-CM | POA: Diagnosis not present

## 2021-05-27 ENCOUNTER — Telehealth: Payer: Self-pay

## 2021-05-27 NOTE — Telephone Encounter (Signed)
Called to schedule AWV and could not lvm. Sw, cma

## 2021-05-30 DIAGNOSIS — E538 Deficiency of other specified B group vitamins: Secondary | ICD-10-CM | POA: Diagnosis not present

## 2021-05-30 DIAGNOSIS — Z23 Encounter for immunization: Secondary | ICD-10-CM | POA: Diagnosis not present

## 2021-05-30 DIAGNOSIS — F339 Major depressive disorder, recurrent, unspecified: Secondary | ICD-10-CM | POA: Diagnosis not present

## 2021-05-30 DIAGNOSIS — K5793 Diverticulitis of intestine, part unspecified, without perforation or abscess with bleeding: Secondary | ICD-10-CM | POA: Diagnosis not present

## 2021-05-30 DIAGNOSIS — E785 Hyperlipidemia, unspecified: Secondary | ICD-10-CM | POA: Diagnosis not present

## 2021-05-30 DIAGNOSIS — I10 Essential (primary) hypertension: Secondary | ICD-10-CM | POA: Diagnosis not present

## 2021-05-30 DIAGNOSIS — D500 Iron deficiency anemia secondary to blood loss (chronic): Secondary | ICD-10-CM | POA: Diagnosis not present

## 2021-06-01 ENCOUNTER — Non-Acute Institutional Stay: Payer: Medicare Other | Admitting: Orthopedic Surgery

## 2021-06-01 ENCOUNTER — Encounter: Payer: Self-pay | Admitting: Orthopedic Surgery

## 2021-06-01 DIAGNOSIS — Z933 Colostomy status: Secondary | ICD-10-CM | POA: Diagnosis not present

## 2021-06-01 DIAGNOSIS — L84 Corns and callosities: Secondary | ICD-10-CM | POA: Diagnosis not present

## 2021-06-01 DIAGNOSIS — R143 Flatulence: Secondary | ICD-10-CM | POA: Diagnosis not present

## 2021-06-01 DIAGNOSIS — I10 Essential (primary) hypertension: Secondary | ICD-10-CM

## 2021-06-01 DIAGNOSIS — D500 Iron deficiency anemia secondary to blood loss (chronic): Secondary | ICD-10-CM

## 2021-06-01 DIAGNOSIS — F339 Major depressive disorder, recurrent, unspecified: Secondary | ICD-10-CM

## 2021-06-01 DIAGNOSIS — E538 Deficiency of other specified B group vitamins: Secondary | ICD-10-CM

## 2021-06-01 DIAGNOSIS — E785 Hyperlipidemia, unspecified: Secondary | ICD-10-CM

## 2021-06-01 DIAGNOSIS — I63342 Cerebral infarction due to thrombosis of left cerebellar artery: Secondary | ICD-10-CM

## 2021-06-01 DIAGNOSIS — K5901 Slow transit constipation: Secondary | ICD-10-CM | POA: Diagnosis not present

## 2021-06-01 NOTE — Progress Notes (Signed)
Location:   [facility] Room Number: 913-A Place of Service:  ALF [phone]) Provider:  Windell Moulding, NP    Patient Care Team: Eulas Post, MD as PCP - General (Family Medicine) Viona Gilmore, [facility] as Pharmacist (Pharmacist)  Extended Emergency Contact Information Primary Emergency Contact: Asa Saun[place] [employer] Phone: [phone] Relation: Daughter  Code Status:  FULL CODE Goals of care: Advanced Directive information Advanced Directives 06/01/2021  Does Patient Have a Medical Advance Directive? Yes  Type of Paramedic of [place];Living will  Does patient want to make changes to medical advance directive? No - Patient declined  Copy of [place] in Chart? Yes - validated most recent copy scanned in chart (See row information)  Would patient like information on creating a medical advance directive? -     Chief Complaint  Patient presents with   Medical Management of Chronic Issues    Routine Visit.    Immunizations    Discuss the need for Shingrix vaccine, Influenza vaccine, and Tetanus vaccine.     HPI:  Pt is a 87 y.o. female seen today for medical management of chronic diseases.    She currently resides on assisted living unit at [facility]. Paste medical history includes: HTN, HLD, diverticulitis s/p colostomy, constipation, cerebral infarction, osteopenia, incontinence, and depression.   HTN- BUN/creat 17/0.6 01/2021, Norvasc HLD- LDL 72 12/2020, Crestor CVA- 2017 MRI brain with left remote thalamic infarct, full dose aspirin and statin Colostomy- d/t diverticulitis, doing better since seeing home health, likes new supplies for colostomy, upset with increased flatulence and burping bag often Constipation- normal output on colostomy today, colace and miralax daily B12 deficiency- vitamin B12 tablets daily Seasonal allergies- no increased allergies with recent  change in weather, Flonase and allegra daily Anemia- hgb 11.0 01/2021, ferrous sulfate daily Depression- no increased depression/blues, Effexor daily Toe Callus- right foot 3rd toe starting to rub into 2nd toe, has seen podiatrist on campus in past, requesting to see podiatry next visit  No recent falls, injuries, or behavioral outbursts.   Recent blood pressures:  09/21- 156/94  09/14- 140/82, 136/68  09/07- 146/90  Recent weights:  08/04- 142 lbs  06/22- 133.2 lbs  06/01- 133.2 lbs    Past Medical History:  Diagnosis Date   Arthritis    Depression    Hyperlipidemia    Osteopenia    Stroke ([place])    Thrombocytopenia (HCC)    Vitamin D deficiency    Wrist fracture 2002   left   Past Surgical History:  Procedure Laterality Date   APPENDECTOMY  1967   CARPAL TUNNEL RELEASE  2008   right   COLON RESECTION SIGMOID N/A 12/03/2020   Procedure: EXPLORATORY LAPAROTOMY; HARTMAN'S PROCEDURE; SPLEENIC FLEXURE MOBILIZATION;  Surgeon: Leighton Ruff, MD;  Location: [facility]S;  Service: General;  Laterality: N/A;   COLOSTOMY N/A 12/03/2020   Procedure: COLOSTOMY;  Surgeon: Leighton Ruff, MD;  Location: [facility];  Service: General;  Laterality: N/A;   JOINT REPLACEMENT  2002   right total     Allergies  Allergen Reactions   Bactrim [Sulfamethoxazole-Trimethoprim] Nausea Only   Ditropan [Oxybutynin]     Made patient feel bad   Sulfa Antibiotics     Other reaction(s): stomach upset    Allergies as of 06/01/2021       Reactions   Bactrim [sulfamethoxazole-trimethoprim] Nausea Only   Ditropan [oxybutynin]    Made patient feel bad  Sulfa Antibiotics    Other reaction(s): stomach upset        Medication List        Accurate as of June 01, 2021 11:03 AM. If you have any questions, ask your nurse or doctor.          STOP taking these medications    cetirizine 10 MG tablet Commonly known as: ZYRTEC Stopped by: Yvonna Alanis, NP       TAKE these medications     acetaminophen 325 MG tablet Commonly known as: TYLENOL Take 2 tablets (650 mg total) by mouth every 6 (six) hours as needed for mild pain or moderate pain.   alum & mag hydroxide-simeth 200-200-20 MG/5ML suspension Commonly known as: MAALOX/MYLANTA Take 30 mLs by mouth every 4 (four) hours as needed for indigestion or heartburn.   amLODipine 2.5 MG tablet Commonly known as: NORVASC Take 2.5 mg by mouth daily.   aspirin 325 MG EC tablet Take 1 tablet (325 mg total) by mouth daily.   Biotin 10 MG Tabs Take 10 mg by mouth daily.   CHELATED MAGNESIUM PO Take 300 mg by mouth daily.   docusate sodium 100 MG capsule Commonly known as: COLACE Take 1 capsule (100 mg total) by mouth 2 (two) times daily.   feeding supplement (BOOST BREEZE) Liqd Take 237 mLs by mouth 3 (three) times daily between meals.   feeding supplement (PRO-STAT SUGAR FREE 64) Liqd Take 30 mLs by mouth daily.   fluticasone 50 MCG/ACT nasal spray Commonly known as: FLONASE Place 1 spray into both nostrils 2 (two) times daily.   IRON PO Take 325 mg by mouth daily.   latanoprost 0.005 % ophthalmic solution Commonly known as: XALATAN Place 1 drop into the left eye at bedtime.   magnesium hydroxide 400 MG/5ML suspension Commonly known as: MILK OF MAGNESIA Take 30 mLs by mouth daily as needed for mild constipation.   minoxidil 2 % external solution Commonly known as: ROGAINE Apply topically 2 (two) times daily as needed.   polyethylene glycol 17 g packet Commonly known as: MIRALAX / GLYCOLAX Take 17 g by mouth daily.   potassium chloride SA 20 MEQ tablet Commonly known as: KLOR-CON Take 20 mEq by mouth daily.   rosuvastatin 10 MG tablet Commonly known as: CRESTOR TAKE 1 TABLET ONCE DAILY.   venlafaxine XR 75 MG 24 hr capsule Commonly known as: EFFEXOR-XR TAKE 2 CAPSULES (150MG ) BY MOUTH DAILY.   vitamin B-12 1000 MCG tablet Commonly known as: CYANOCOBALAMIN Take 1,000 mcg by mouth daily.    Vitamin D3 50 MCG (2000 UT) capsule Take 2,000 Units by mouth daily.   Vitamin E 45 MG (100 UNIT) Caps Take 1 capsule by mouth daily.        Review of Systems  Constitutional:  Negative for activity change, appetite change, fatigue and fever.  HENT:  Positive for hearing loss. Negative for dental problem and trouble swallowing.        Partial uppers, bilateral hearing aids  Eyes:  Negative for visual disturbance.       Glasses  Respiratory:  Negative for cough, shortness of breath and wheezing.   Cardiovascular:  Negative for chest pain and leg swelling.  Gastrointestinal:  Positive for constipation. Negative for abdominal distention, abdominal pain, diarrhea and nausea.       Colostomy  Genitourinary:  Negative for dysuria, frequency, hematuria and urgency.       Urinary incontinence  Musculoskeletal:  Positive for gait problem.  Skin:  Negative.   Neurological:  Positive for weakness. Negative for dizziness.  Psychiatric/Behavioral:  Negative for confusion and dysphoric mood. The patient is not nervous/anxious.    Immunization History  Administered Date(s) Administered   Fluad Quad(high Dose 65+) 06/13/2019   Influenza Split 06/18/2012   Influenza Whole 07/11/2010   Influenza, High Dose Seasonal PF 07/07/2013, 07/14/2014, 06/24/2015, 06/13/2016, 06/04/2017, 06/17/2018   Influenza-Unspecified 06/22/2020   Moderna SARS-COV2 Booster Vaccination 07/19/2020, 02/07/2021   Moderna Sars-Covid-2 Vaccination 09/14/2019, 10/12/2019   Pneumococcal Conjugate-13 04/27/2014   Pneumococcal Polysaccharide-23 10/15/2017   Td 09/10/2005   Zoster, Live 11/29/2006   Pertinent  Health Maintenance Due  Topic Date Due   INFLUENZA VACCINE  04/10/2021   DEXA SCAN  Completed   Fall Risk  08/31/2020 10/16/2019 08/04/2019 07/31/2018 06/17/2018  Falls in the past year? 1 0 0 0 No  Comment - - Emmi Telephone Survey: data to providers prior to load Emmi Telephone Survey: data to providers prior to  load -  Number falls in past yr: 1 - - - -  Injury with Fall? 1 - - - -  Comment September 2021 head injury minor - - - -  Risk for fall due to : - Medication side effect - - -  Risk for fall due to: Comment - - - - -  Follow up - Falls evaluation completed;Education provided;Falls prevention discussed - - -   Functional Status Survey:    Vitals:   06/01/21 1055  BP: (!) 156/94  Pulse: 89  Resp: 18  Temp: 98.9 F (37.2 C)  SpO2: 98%  Weight: 142 lb (64.4 kg)  Height: 5\' 3"  (1.6 m)   Body mass index is 25.15 kg/m. Physical Exam Vitals reviewed.  Constitutional:      General: She is not in acute distress. HENT:     Head: Normocephalic.     Right Ear: There is no impacted cerumen.     Left Ear: There is no impacted cerumen.     Nose: Nose normal.     Mouth/Throat:     Mouth: Mucous membranes are moist.  Eyes:     General:        Right eye: No discharge.        Left eye: No discharge.     Pupils: Pupils are equal, round, and reactive to light.  Neck:     Vascular: No carotid bruit.  Cardiovascular:     Rate and Rhythm: Normal rate and regular rhythm.     Pulses: Normal pulses.     Heart sounds: Normal heart sounds. No murmur heard. Pulmonary:     Effort: Pulmonary effort is normal. No respiratory distress.     Breath sounds: Normal breath sounds. No wheezing.  Abdominal:     General: Bowel sounds are normal. There is no distension.     Palpations: Abdomen is soft.     Tenderness: There is no abdominal tenderness.     Comments: Coloctomy with beefy red stoma, skin around stoma intact.   Musculoskeletal:     Cervical back: Normal range of motion.     Right lower leg: No edema.     Left lower leg: No edema.  Lymphadenopathy:     Cervical: No cervical adenopathy.  Skin:    General: Skin is warm and dry.     Capillary Refill: Capillary refill takes less than 2 seconds.  Neurological:     General: No focal deficit present.     Mental Status: She is  alert and  oriented to person, place, and time.     Motor: Weakness present.     Gait: Gait abnormal.     Comments: walker  Psychiatric:        Mood and Affect: Mood normal.        Behavior: Behavior normal.    Labs reviewed: Recent Labs    12/12/20 0325 12/13/20 0430 12/14/20 0410 12/21/20 0000 12/24/20 0000 01/24/21 2359  NA 138 136 141 141 138 137  K 4.5 3.8 3.6 2.7* 3.5 4.3  CL 109 103 105 103 103 102  CO2 25 26 26  28* 29* 27*  GLUCOSE 122* 134* 116*  --   --   --   BUN 6* 9 13 10 10 17   CREATININE 0.34* 0.42* 0.45 0.4* 0.4* 0.6  CALCIUM 8.4* 8.7* 8.9 8.5* 9.1 9.0  MG 2.2 1.8 1.7 1.8  --   --   PHOS 3.4 4.2 4.3  --   --   --    Recent Labs    12/10/20 0552 12/11/20 0405 12/12/20 0325 12/21/20 0000 01/24/21 2359  AST 16 17 17  12* 14  ALT 12 12 12 9 12   ALKPHOS 42 41 45 65 74  BILITOT 0.6 0.5 0.3  --   --   PROT 5.2* 5.2* 5.3*  --   --   ALBUMIN 2.2* 2.2* 2.2* 2.9* 3.0*   Recent Labs    12/09/20 0446 12/12/20 0325 12/14/20 0410 12/21/20 0000 12/29/20 0000 01/24/21 2359  WBC 6.9 7.9 8.8 4.3 5.5 4.5  NEUTROABS 5.1 6.0  --  2,683.00  --  2,547.00  HGB 8.1* 7.4* 7.2* 7.6* 10.0* 11.0*  HCT 25.5* 24.3* 23.6* 26* 33* 36  MCV 88.2 93.1 94.0  --   --   --   PLT 172 144* 170 195 201 104*   Lab Results  Component Value Date   TSH 1.40 12/21/2020   Lab Results  Component Value Date   HGBA1C 5.9 (H) 09/26/2015   Lab Results  Component Value Date   CHOL 136 12/21/2020   HDL 93 (A) 12/21/2020   LDLCALC 72 12/21/2020   LDLDIRECT 147.0 11/17/2012   TRIG 119 12/21/2020   CHOLHDL 3 02/10/2020    Significant Diagnostic Results in last 30 days:  No results found.  Assessment/Plan 1. Essential hypertension - controlled - cont norvasc  2. Hyperlipidemia LDL goal <70 -LDL 72 12/2020 - cont Crestor  3. 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 Slow transit constipation - good output in  colostomy - cont colace and miralax  5. Vitamin B12 deficiency - cont vitamin B12 supplement  6. Blood loss anemia - hgb 11.0 01/2021 -cont ferrous sulfate  7. Depression, recurrent (HCC) - cont Effexor  8. Callus of foot - right foot 3rd toenail with thick callus/ nail pushing into 2nd toe - podiatry consult  9. Flatulence - increased flatulence, having to burp bag often - discussed decreasing foods that increase flatulence - start simethicone 80 mg tablet po bid  10. Colostomy - d/t diverticulitis - she is doing better after seeing home health    Family/ staff Communication: plan discussed with patient and nurse  Labs/tests ordered:  none

## 2021-06-02 ENCOUNTER — Telehealth: Payer: Self-pay | Admitting: *Deleted

## 2021-06-02 NOTE — Telephone Encounter (Signed)
Sara Logan with [facility] [phone] called requesting Verbal order to send patient her Ostomy Supplies. Stated that patient is going to run out on Tuesday.  Requesting Verbal order for supplies.   Please Advise.

## 2021-06-02 NOTE — Telephone Encounter (Signed)
Yes Please she needs it

## 2021-06-02 NOTE — Telephone Encounter (Signed)
AL nurse is talking with Sara Logan- [facility] has traditionally ordered her supplies for her.

## 2021-06-02 NOTE — Telephone Encounter (Signed)
I will call him.

## 2021-06-06 ENCOUNTER — Other Ambulatory Visit: Payer: Self-pay | Admitting: Internal Medicine

## 2021-06-06 DIAGNOSIS — L814 Other melanin hyperpigmentation: Secondary | ICD-10-CM | POA: Diagnosis not present

## 2021-06-06 DIAGNOSIS — L821 Other seborrheic keratosis: Secondary | ICD-10-CM | POA: Diagnosis not present

## 2021-06-06 DIAGNOSIS — D692 Other nonthrombocytopenic purpura: Secondary | ICD-10-CM | POA: Diagnosis not present

## 2021-06-06 DIAGNOSIS — L650 Telogen effluvium: Secondary | ICD-10-CM | POA: Diagnosis not present

## 2021-06-06 DIAGNOSIS — L570 Actinic keratosis: Secondary | ICD-10-CM | POA: Diagnosis not present

## 2021-06-06 DIAGNOSIS — Z1231 Encounter for screening mammogram for malignant neoplasm of breast: Secondary | ICD-10-CM

## 2021-07-10 ENCOUNTER — Ambulatory Visit: Payer: Medicare Other

## 2021-07-10 DIAGNOSIS — H52201 Unspecified astigmatism, right eye: Secondary | ICD-10-CM | POA: Diagnosis not present

## 2021-07-10 DIAGNOSIS — H5211 Myopia, right eye: Secondary | ICD-10-CM | POA: Diagnosis not present

## 2021-07-10 DIAGNOSIS — Z961 Presence of intraocular lens: Secondary | ICD-10-CM | POA: Diagnosis not present

## 2021-07-10 DIAGNOSIS — H401421 Capsular glaucoma with pseudoexfoliation of lens, left eye, mild stage: Secondary | ICD-10-CM | POA: Diagnosis not present

## 2021-07-10 DIAGNOSIS — H524 Presbyopia: Secondary | ICD-10-CM | POA: Diagnosis not present

## 2021-07-11 ENCOUNTER — Ambulatory Visit
Admission: RE | Admit: 2021-07-11 | Discharge: 2021-07-11 | Disposition: A | Discharge status: Home / Self Care | Payer: Medicare Other | Source: Ambulatory Visit | Attending: Internal Medicine | Admitting: Internal Medicine

## 2021-07-11 DIAGNOSIS — Z1231 Encounter for screening mammogram for malignant neoplasm of breast: Secondary | ICD-10-CM

## 2021-07-20 DIAGNOSIS — F330 Major depressive disorder, recurrent, mild: Secondary | ICD-10-CM | POA: Diagnosis not present

## 2021-08-01 DIAGNOSIS — L570 Actinic keratosis: Secondary | ICD-10-CM | POA: Diagnosis not present

## 2021-08-01 DIAGNOSIS — B3783 Candidal cheilitis: Secondary | ICD-10-CM | POA: Diagnosis not present

## 2021-08-01 DIAGNOSIS — L650 Telogen effluvium: Secondary | ICD-10-CM | POA: Diagnosis not present

## 2021-08-01 DIAGNOSIS — L821 Other seborrheic keratosis: Secondary | ICD-10-CM | POA: Diagnosis not present

## 2021-08-01 DIAGNOSIS — L820 Inflamed seborrheic keratosis: Secondary | ICD-10-CM | POA: Diagnosis not present

## 2021-09-01 ENCOUNTER — Encounter: Payer: Self-pay | Admitting: Nurse Practitioner

## 2021-09-01 ENCOUNTER — Non-Acute Institutional Stay (INDEPENDENT_AMBULATORY_CARE_PROVIDER_SITE_OTHER): Payer: Medicare Other | Admitting: Nurse Practitioner

## 2021-09-01 DIAGNOSIS — M8580 Other specified disorders of bone density and structure, unspecified site: Secondary | ICD-10-CM

## 2021-09-01 DIAGNOSIS — Z0000 Encounter for general adult medical examination without abnormal findings: Secondary | ICD-10-CM | POA: Diagnosis not present

## 2021-09-05 ENCOUNTER — Encounter: Payer: Self-pay | Admitting: Nurse Practitioner

## 2021-09-05 NOTE — Progress Notes (Addendum)
Subjective:   Sara Logan is a 87 y.o. female who presents for Medicare Annual (Subsequent) preventive examination at [place].    Objective:    Today's Vitals   09/01/21 1112  BP: (!) 148/64  Pulse: 84  Resp: 18  Temp: 98.2 F (36.8 C)  SpO2: 97%  Weight: 145 lb 6.4 oz (66 kg)  Height: 5\' 3"  (1.6 m)   Body mass index is 25.76 kg/m.  Advanced Directives 09/01/2021 06/01/2021 03/07/2021 02/01/2021 01/23/2021 01/20/2021 12/04/2020  Does Patient Have a Medical Advance Directive? Yes Yes Yes Yes Yes Yes No  Type of Paramedic of [place];Living will [place];Living will [place];Living will [place];Living will [place];Living will [place];Living will -  Does patient want to make changes to medical advance directive? No - Patient declined No - Patient declined No - Patient declined No - Patient declined No - Patient declined No - Patient declined -  Copy of [place] in Chart? Yes - validated most recent copy scanned in chart (See row information) Yes - validated most recent copy scanned in chart (See row information) Yes - validated most recent copy scanned in chart (See row information) Yes - validated most recent copy scanned in chart (See row information) No - copy requested No - copy requested -  Would patient like information on creating a medical advance directive? - - - - - - No - Patient declined    Current Medications (verified) Outpatient Encounter Medications as of 09/01/2021  Medication Sig   acetaminophen (TYLENOL) 325 MG tablet Take 2 tablets (650 mg total) by mouth every 6 (six) hours as needed for mild pain or moderate pain.   alum & mag hydroxide-simeth (MAALOX/MYLANTA) 200-200-20 MG/5ML suspension Take 30 mLs by mouth every 4 (four) hours as needed for indigestion or heartburn.   Amino Acids-Protein  Hydrolys (FEEDING SUPPLEMENT, PRO-STAT SUGAR FREE 64,) LIQD Take 30 mLs by mouth daily.   amLODipine (NORVASC) 2.5 MG tablet Take 2.5 mg by mouth daily.   aspirin EC 325 MG EC tablet Take 1 tablet (325 mg total) by mouth daily.   Biotin 10 MG TABS Take 10 mg by mouth daily.    CHELATED MAGNESIUM PO Take 300 mg by mouth daily.   Cholecalciferol (VITAMIN D3) 2000 units capsule Take 2,000 Units by mouth daily.   docusate sodium (COLACE) 100 MG capsule Take 1 capsule (100 mg total) by mouth 2 (two) times daily.   Ferrous Sulfate (IRON PO) Take 325 mg by mouth daily.   fluticasone (FLONASE) 50 MCG/ACT nasal spray Place 1 spray into both nostrils 2 (two) times daily.   latanoprost (XALATAN) 0.005 % ophthalmic solution Place 1 drop into the left eye at bedtime.   loperamide (IMODIUM A-D) 2 MG tablet Take 2 mg by mouth as needed for diarrhea or loose stools.   magnesium hydroxide (MILK OF MAGNESIA) 400 MG/5ML suspension Take 30 mLs by mouth daily as needed for mild constipation.   minoxidil (ROGAINE) 2 % external solution Apply topically daily as needed.   Nutritional Supplements (FEEDING SUPPLEMENT, BOOST BREEZE,) LIQD Take 237 mLs by mouth 3 (three) times daily between meals.   polyethylene glycol (MIRALAX / GLYCOLAX) 17 g packet Take 17 g by mouth daily.   potassium chloride SA (KLOR-CON) 20 MEQ tablet Take 20 mEq by mouth daily.   rosuvastatin (CRESTOR) 10 MG tablet TAKE 1 TABLET  ONCE DAILY.   simethicone (MYLICON) 80 MG chewable tablet Chew 80 mg by mouth 2 (two) times daily.   venlafaxine XR (EFFEXOR-XR) 75 MG 24 hr capsule TAKE 2 CAPSULES (150MG ) BY MOUTH DAILY.   vitamin B-12 (CYANOCOBALAMIN) 1000 MCG tablet Take 1,000 mcg by mouth daily.   Vitamin E 45 MG (100 UNIT) CAPS Take 1 capsule by mouth daily.   No facility-administered encounter medications on file as of 09/01/2021.    Allergies (verified) Bactrim [sulfamethoxazole-trimethoprim], Ditropan [oxybutynin], and Sulfa antibiotics    History: Past Medical History:  Diagnosis Date   Arthritis    Depression    Hyperlipidemia    Osteopenia    Stroke ([place])    Thrombocytopenia (HCC)    Vitamin D deficiency    Wrist fracture 2002   left   Past Surgical History:  Procedure Laterality Date   APPENDECTOMY  1967   CARPAL TUNNEL RELEASE  2008   right   COLON RESECTION SIGMOID N/A 12/03/2020   Procedure: EXPLORATORY LAPAROTOMY; HARTMAN'S PROCEDURE; SPLEENIC FLEXURE MOBILIZATION;  Surgeon: Leighton Ruff, MD;  Location: [facility];  Service: General;  Laterality: N/A;   COLOSTOMY N/A 12/03/2020   Procedure: COLOSTOMY;  Surgeon: Leighton Ruff, MD;  Location: [facility];  Service: General;  Laterality: N/A;   JOINT REPLACEMENT  2002   right total    Family History  Problem Relation Age of Onset   Breast cancer Mother    Breast cancer Sister    Social History   Socioeconomic History   Marital status: Widowed    Spouse name: Not on file   Number of children: 4   Years of education: 16   Highest education level: Bachelor's degree (e.g., BA, AB, BS)  Occupational History   Not on file  Tobacco Use   Smoking status: Never   Smokeless tobacco: Never  Vaping Use   Vaping Use: Never used  Substance and Sexual Activity   Alcohol use: No   Drug use: No   Sexual activity: Not on file  Other Topics Concern   Not on file  Social History Narrative   Lives in senior apartment at [facility]   widowed   Social Determinants of Health   Financial Resource Strain: Not on file  Food Insecurity: Not on file  Transportation Needs: Not on file  Physical Activity: Not on file  Stress: Not on file  Social Connections: Not on file    Tobacco Counseling Counseling given: Not Answered   Clinical Intake:  Pre-visit preparation completed: Yes  Pain : No/denies pain     BMI - recorded: 25.76 Nutritional Status: BMI 25 -29 Overweight Nutritional Risks: None Diabetes: No  How often do you need to have someone help  you when you read instructions, pamphlets, or other written materials from your doctor or pharmacy?: 1 - Never What is the last grade level you completed in school?: college  Diabetic?no  Interpreter Needed?: No  Information entered by :: Porfiria Heinrich Bretta Bang NP   Activities of Daily Living In your present state of health, do you have any difficulty performing the following activities: 09/05/2021 12/04/2020  Hearing? N Y  Vision? N N  Difficulty concentrating or making decisions? N N  Walking or climbing stairs? Y -  Dressing or bathing? Y -  Doing errands, shopping? Y -  Conservation officer, nature and eating ? N -  Using the Toilet? N -  In the past six months, have you accidently leaked urine? Y -  Do you  have problems with loss of bowel control? Y -  Managing your Medications? Y -  Managing your Finances? Y -  Housekeeping or managing your Housekeeping? Y -  Some recent data might be hidden    Patient Care Team: Eulas Post, MD as PCP - General (Family Medicine) Viona Gilmore, [facility] as Pharmacist (Pharmacist)  Indicate any recent Medical Services you may have received from other than [facility] providers in the past year (date may be approximate).     Assessment:   This is a routine wellness examination for [place].  Hearing/Vision screen Vision Screening - Comments:: Last visit: 05/25/2021 [place]   Dietary issues and exercise activities discussed: Current Exercise Habits: The patient does not participate in regular exercise at present, Exercise limited by: cardiac condition(s);orthopedic condition(s)   Goals Addressed             This Visit's Progress    Maintain Mobility and Function       Evidence-based guidance:  Emphasize the importance of physical activity and aerobic exercise as included in treatment plan; assess barriers to adherence; consider patient's abilities and preferences.  Encourage gradual increase in activity or exercise instead of stopping if  pain occurs.  Reinforce individual therapy exercise prescription, such as strengthening, stabilization and stretching programs.  Promote optimal body mechanics to stabilize the spine with lifting and functional activity.  Encourage activity and mobility modifications to facilitate optimal function, such as using a log roll for bed mobility or dressing from a seated position.  Reinforce individual adaptive equipment recommendations to limit excessive spinal movements, such as a Systems analyst.  Assess adequacy of sleep; encourage use of sleep hygiene techniques, such as bedtime routine; use of white noise; dark, cool bedroom; avoiding daytime naps, heavy meals or exercise before bedtime.  Promote positions and modification to optimize sleep and sexual activity; consider pillows or positioning devices to assist in maintaining neutral spine.  Explore options for applying ergonomic principles at work and home, such as frequent position changes, using ergonomically designed equipment and working at optimal height.  Promote modifications to increase comfort with driving such as lumbar support, optimizing seat and steering wheel position, using cruise control and taking frequent rest stops to stretch and walk.   Notes:        Depression Screen PHQ 2/9 Scores 10/11/2020 08/31/2020 10/16/2019 07/18/2018 06/17/2018 06/13/2017 06/08/2016  PHQ - 2 Score 3 0 2 0 1 0 2  PHQ- 9 Score 8 - 3 0 - - 4    Fall Risk Fall Risk  08/31/2020 10/16/2019 08/04/2019 07/31/2018 06/17/2018  Falls in the past year? 1 0 0 0 No  Comment - - Emmi Telephone Survey: data to providers prior to load Emmi Telephone Survey: data to providers prior to load -  Number falls in past yr: 1 - - - -  Injury with Fall? 1 - - - -  Comment September 2021 head injury minor - - - -  Risk for fall due to : - Medication side effect - - -  Risk for fall due to: Comment - - - - -  Follow up - Falls evaluation completed;Education provided;Falls  prevention discussed - - -    FALL RISK PREVENTION PERTAINING TO THE HOME:  Any stairs in or around the home? Yes  If so, are there any without handrails? No  Home free of loose throw rugs in walkways, pet beds, electrical cords, etc? Yes  Adequate lighting in your home to  reduce risk of falls? Yes   ASSISTIVE DEVICES UTILIZED TO PREVENT FALLS:  Life alert? No  Use of a cane, walker or w/c? Yes  Grab bars in the bathroom? Yes  Shower chair or bench in shower? Yes  Elevated toilet seat or a handicapped toilet? Yes   TIMED UP AND GO:  Was the test performed? Yes .  Length of time to ambulate 10 feet: 10 sec.   Gait steady and fast with assistive device  Cognitive Function: MMSE - Mini Mental State Exam 09/12/2021 06/17/2018 06/13/2017 06/08/2016  Not completed: - (No Data) (No Data) (No Data)  Orientation to time 4 - - -  Orientation to Place 5 - - -  Registration 3 - - -  Attention/ Calculation 5 - - -  Recall 3 - - -  Language- name 2 objects 2 - - -  Language- repeat 1 - - -  Language- follow 3 step command 3 - - -  Language- read & follow direction 1 - - -  Write a sentence 1 - - -        Immunizations Immunization History  Administered Date(s) Administered   Fluad Quad(high Dose 65+) 06/13/2019   Influenza Split 06/18/2012   Influenza Whole 07/11/2010   Influenza, High Dose Seasonal PF 07/07/2013, 07/14/2014, 06/24/2015, 06/13/2016, 06/04/2017, 06/17/2018   Influenza-Unspecified 06/22/2020, 06/29/2021   Moderna SARS-COV2 Booster Vaccination 07/19/2020, 02/07/2021   Moderna Sars-Covid-2 Vaccination 09/14/2019, 10/12/2019   Pneumococcal Conjugate-13 04/27/2014   Pneumococcal Polysaccharide-23 10/15/2017   Td 09/10/2005   Unspecified SARS-COV-2 Vaccination 05/30/2021   Zoster, Live 11/29/2006    TDAP status: Up to date  Flu Vaccine status: Up to date  Pneumococcal vaccine status: Up to date  Covid-19 vaccine status: Completed vaccines  Qualifies for  Shingles Vaccine? Yes   Zostavax completed Yes   Shingrix Completed?: Yes  Screening Tests Health Maintenance  Topic Date Due   Zoster Vaccines- Shingrix (1 of 2) Never done   TETANUS/TDAP  09/11/2015   COVID-19 Vaccine (4 - Booster) 07/25/2021   Pneumonia Vaccine 66+ Years old  Completed   INFLUENZA VACCINE  Completed   DEXA SCAN  Completed   HPV VACCINES  Aged Out    Health Maintenance  Health Maintenance Due  Topic Date Due   Zoster Vaccines- Shingrix (1 of 2) Never done   TETANUS/TDAP  09/11/2015   COVID-19 Vaccine (4 - Booster) 07/25/2021    Colorectal cancer screening: No longer required.   Mammogram status: Completed 2022. Repeat every year  Bone Density status: Ordered DEXA. Pt provided with contact info and advised to call to schedule appt.  Lung Cancer Screening: (Low Dose CT Chest recommended if Age 63-80 years, 30 pack-year currently smoking OR have quit w/in 15years.) does not qualify.    Additional Screening:  Hepatitis C Screening: does not qualify  Vision Screening: Recommended annual ophthalmology exams for early detection of glaucoma and other disorders of the eye. Is the patient up to date with their annual eye exam?  Yes  Who is the provider or what is the name of the office in which the patient attends annual eye exams? [facility] If pt is not established with a provider, would they like to be referred to a provider to establish care? No .   Dental Screening: Recommended annual dental exams for proper oral hygiene  Community Resource Referral / Chronic Care Management: CRR required this visit?  No   CCM required this visit?  No  Plan:     I have personally reviewed and noted the following in the patients chart:   Medical and social history Use of alcohol, tobacco or illicit drugs  Current medications and supplements including opioid prescriptions.  Functional ability and status Nutritional status Physical activity Advanced  directives List of other physicians Hospitalizations, surgeries, and ER visits in previous 12 months Vitals Screenings to include cognitive, depression, and falls Referrals and appointments  In addition, I have reviewed and discussed with patient certain preventive protocols, quality metrics, and best practice recommendations. A written personalized care plan for preventive services as well as general preventive health recommendations were provided to patient.   DEXA MMSE order provided  Annalicia Renfrew X Vickie Melnik, NP   09/21/2021

## 2021-09-08 DIAGNOSIS — M85841 Other specified disorders of bone density and structure, right hand: Secondary | ICD-10-CM | POA: Diagnosis not present

## 2021-09-12 DIAGNOSIS — Z20822 Contact with and (suspected) exposure to covid-19: Secondary | ICD-10-CM | POA: Diagnosis not present

## 2021-09-15 DIAGNOSIS — F330 Major depressive disorder, recurrent, mild: Secondary | ICD-10-CM | POA: Diagnosis not present

## 2021-09-21 NOTE — Assessment & Plan Note (Addendum)
09/07/21 DEXA t score -1.84, adding Ca 600mg  qd, continue Vit D.

## 2021-10-04 DIAGNOSIS — L814 Other melanin hyperpigmentation: Secondary | ICD-10-CM | POA: Diagnosis not present

## 2021-10-04 DIAGNOSIS — L570 Actinic keratosis: Secondary | ICD-10-CM | POA: Diagnosis not present

## 2021-11-17 ENCOUNTER — Encounter: Payer: Self-pay | Admitting: Orthopedic Surgery

## 2021-11-17 NOTE — Progress Notes (Signed)
This encounter was created in error - please disregard.

## 2021-11-20 ENCOUNTER — Non-Acute Institutional Stay (SKILLED_NURSING_FACILITY): Payer: Medicare Other | Admitting: Nurse Practitioner

## 2021-11-20 ENCOUNTER — Encounter: Payer: Self-pay | Admitting: Nurse Practitioner

## 2021-11-20 DIAGNOSIS — E538 Deficiency of other specified B group vitamins: Secondary | ICD-10-CM

## 2021-11-20 DIAGNOSIS — E785 Hyperlipidemia, unspecified: Secondary | ICD-10-CM

## 2021-11-20 DIAGNOSIS — I63342 Cerebral infarction due to thrombosis of left cerebellar artery: Secondary | ICD-10-CM

## 2021-11-20 DIAGNOSIS — M19032 Primary osteoarthritis, left wrist: Secondary | ICD-10-CM | POA: Diagnosis not present

## 2021-11-20 DIAGNOSIS — Z933 Colostomy status: Secondary | ICD-10-CM

## 2021-11-20 DIAGNOSIS — F339 Major depressive disorder, recurrent, unspecified: Secondary | ICD-10-CM | POA: Diagnosis not present

## 2021-11-20 DIAGNOSIS — I10 Essential (primary) hypertension: Secondary | ICD-10-CM

## 2021-11-20 DIAGNOSIS — R2232 Localized swelling, mass and lump, left upper limb: Secondary | ICD-10-CM | POA: Diagnosis not present

## 2021-11-20 DIAGNOSIS — E876 Hypokalemia: Secondary | ICD-10-CM | POA: Diagnosis not present

## 2021-11-20 DIAGNOSIS — M25532 Pain in left wrist: Secondary | ICD-10-CM

## 2021-11-20 DIAGNOSIS — K5901 Slow transit constipation: Secondary | ICD-10-CM | POA: Diagnosis not present

## 2021-11-20 DIAGNOSIS — D500 Iron deficiency anemia secondary to blood loss (chronic): Secondary | ICD-10-CM

## 2021-11-20 NOTE — Assessment & Plan Note (Signed)
supplemented, takes Kcl, serum K 4.3 01/24/21 ?

## 2021-11-20 NOTE — Assessment & Plan Note (Signed)
Blood pressure is controlled,  takes Amlodipine Bun/creat 17/0.6 01/24/21 ?

## 2021-11-20 NOTE — Progress Notes (Unsigned)
Location:   [place] Room Number: 712 Place of Service:  ALF [phone]) Provider: [facility] Berenise Hunton NP  Virgie Dad, MD  Patient Care Team: Virgie Dad, MD as PCP - General (Internal Medicine) Viona Gilmore, [facility] as Pharmacist (Pharmacist)  Extended Emergency Contact Information Primary Emergency Contact: [place] [employer] Phone: [phone] Relation: Daughter  Code Status: DNR Goals of care: Advanced Directive information Advanced Directives 09/01/2021  Does Patient Have a Medical Advance Directive? Yes  Type of Paramedic of [place];Living will  Does patient want to make changes to medical advance directive? No - Patient declined  Copy of [place] in Chart? Yes - validated most recent copy scanned in chart (See row information)  Would patient like information on creating a medical advance directive? -     Chief Complaint  Patient presents with   Acute Visit    Left wrist pain    HPI:  Pt is a 88 y.o. female seen today for an acute visit for the left wrist pain, tingling of fingers, slightly warmth and swelling, denied trauma, admitted its on and off in the past. On Tylenol, declined stronger analgesics for pain.    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, s/p Hartman's procedure, splenic flexure mobilization             HTN takes Amlodipine Bun/creat 17/0.6 01/24/21             Hypomagnesemia, Mg 1.8 12/20/20             Vit B12 deficiency, takes Vit B, Vit B12 942             Hx of CVA w/o residual deficit             Hyperlipidemia, takes Rosuvastatin, LDL 72 12/20/20             Depression, takes Venlafaxine             Constipation, takes MiraLax Colace             Post op anemia, takes Fe Hgb 11 01/24/21             Hypokalemia, supplemented, takes Kcl, serum K 4.3 01/24/21             Allergic rhinitis, takes Zyrtec,  Flonase   Past Medical History:   Diagnosis Date   Arthritis    Depression    Hyperlipidemia    Osteopenia    Stroke ([place])    Thrombocytopenia ([place])    Vitamin D deficiency    Wrist fracture 2002   left   Past Surgical History:  Procedure Laterality Date   APPENDECTOMY  1967   CARPAL TUNNEL RELEASE  2008   right   COLON RESECTION SIGMOID N/A 12/03/2020   Procedure: EXPLORATORY LAPAROTOMY; HARTMAN'S PROCEDURE; SPLEENIC FLEXURE MOBILIZATION;  Surgeon: Leighton Ruff, MD;  Location: [facility];  Service: General;  Laterality: N/A;   COLOSTOMY N/A 12/03/2020   Procedure: COLOSTOMY;  Surgeon: Leighton Ruff, MD;  Location: [facility];  Service: General;  Laterality: N/A;   JOINT REPLACEMENT  2002   right total     Allergies  Allergen Reactions   Bactrim [Sulfamethoxazole-Trimethoprim] Nausea Only   Ditropan [Oxybutynin]     Made patient feel bad   Sulfa Antibiotics     Other reaction(s): stomach upset    Allergies as of 11/20/2021       Reactions  Bactrim [sulfamethoxazole-trimethoprim] Nausea Only   Ditropan [oxybutynin]    Made patient feel bad   Sulfa Antibiotics    Other reaction(s): stomach upset        Medication List        Accurate as of November 20, 2021  4:08 PM. If you have any questions, ask your nurse or doctor.          acetaminophen 325 MG tablet Commonly known as: TYLENOL Take 2 tablets (650 mg total) by mouth every 6 (six) hours as needed for mild pain or moderate pain.   alum & mag hydroxide-simeth 200-200-20 MG/5ML suspension Commonly known as: MAALOX/MYLANTA Take 30 mLs by mouth every 4 (four) hours as needed for indigestion or heartburn.   amLODipine 2.5 MG tablet Commonly known as: NORVASC Take 2.5 mg by mouth daily.   aspirin 325 MG EC tablet Take 1 tablet (325 mg total) by mouth daily.   Biotin 10 MG Tabs Take 10 mg by mouth daily.   CharcoCaps 260 MG Caps Generic drug: Charcoal Activated Take 1 capsule by mouth every 6 (six) hours as needed (For gas).   CHELATED  MAGNESIUM PO Take 300 mg by mouth daily.   feeding supplement (BOOST BREEZE) Liqd Take 237 mLs by mouth 3 (three) times daily between meals.   feeding supplement (PRO-STAT SUGAR FREE 64) Liqd Take 30 mLs by mouth daily.   fluticasone 50 MCG/ACT nasal spray Commonly known as: FLONASE Place 1 spray into both nostrils 2 (two) times daily.   IRON PO Take 325 mg by mouth daily.   latanoprost 0.005 % ophthalmic solution Commonly known as: XALATAN Place 1 drop into the left eye at bedtime.   loperamide 2 MG tablet Commonly known as: IMODIUM A-D Take 2 mg by mouth as needed for diarrhea or loose stools.   magnesium hydroxide 400 MG/5ML suspension Commonly known as: MILK OF MAGNESIA Take 30 mLs by mouth daily as needed for mild constipation.   minoxidil 2 % external solution Commonly known as: ROGAINE Apply topically daily as needed.   polyethylene glycol 17 g packet Commonly known as: MIRALAX / GLYCOLAX Take 17 g by mouth daily.   potassium chloride SA 20 MEQ tablet Commonly known as: KLOR-CON M Take 20 mEq by mouth daily.   rosuvastatin 10 MG tablet Commonly known as: CRESTOR TAKE 1 TABLET ONCE DAILY.   venlafaxine XR 75 MG 24 hr capsule Commonly known as: EFFEXOR-XR TAKE 2 CAPSULES (150MG) BY MOUTH DAILY.   vitamin B-12 1000 MCG tablet Commonly known as: CYANOCOBALAMIN Take 1,000 mcg by mouth daily.   Vitamin D3 50 MCG (2000 UT) capsule Take 2,000 Units by mouth daily.   Vitamin E 45 MG (100 UNIT) Caps Take 1 capsule by mouth daily.        Review of Systems  Constitutional:  Negative for appetite change, fatigue and fever.  HENT:  Negative for congestion, hearing loss and rhinorrhea.   Eyes:  Negative for visual disturbance.  Respiratory:  Negative for cough, shortness of breath and wheezing.   Cardiovascular:  Positive for leg swelling.  Gastrointestinal:  Negative for abdominal pain and constipation.  Genitourinary:  Positive for frequency. Negative  for dysuria and urgency.  Musculoskeletal:  Positive for arthralgias and gait problem.       Left wrist pain, swelling, warmth  Skin:  Negative for color change.  Neurological:  Negative for speech difficulty, weakness and light-headedness.  Psychiatric/Behavioral:  Negative for confusion and sleep disturbance. The patient is not  nervous/anxious.    Immunization History  Administered Date(s) Administered   Fluad Quad(high Dose 65+) 06/13/2019   Influenza Split 06/18/2012   Influenza Whole 07/11/2010   Influenza, High Dose Seasonal PF 07/07/2013, 07/14/2014, 06/24/2015, 06/13/2016, 06/04/2017, 06/17/2018   Influenza-Unspecified 06/22/2020, 06/29/2021   Moderna SARS-COV2 Booster Vaccination 07/19/2020, 02/07/2021   Moderna Sars-Covid-2 Vaccination 09/14/2019, 10/12/2019   Pneumococcal Conjugate-13 04/27/2014   Pneumococcal Polysaccharide-23 10/15/2017   Td 09/10/2005   Unspecified SARS-COV-2 Vaccination 05/30/2021   Zoster, Live 11/29/2006   Pertinent  Health Maintenance Due  Topic Date Due   INFLUENZA VACCINE  Completed   DEXA SCAN  Completed   Fall Risk 12/13/2020 12/14/2020 12/14/2020 12/14/2020 12/15/2020  Falls in the past year? - - - - -  Was there an injury with Fall? - - - - -  Was there an injury with Fall? - - - - -  Fall Risk Category Calculator - - - - -  Fall Risk Category - - - - -  Patient Fall Risk Level Moderate fall risk Moderate fall risk Moderate fall risk Moderate fall risk High fall risk  Patient at Risk for Falls Due to - - - - -  Patient at Risk for Falls Due to - - - - -  Fall risk Follow up - - - - -   Functional Status Survey:    Vitals:   11/20/21 1552  BP: (!) 142/76  Pulse: 78  Resp: 18  Temp: 97.8 F (36.6 C)  SpO2: 95%   There is no height or weight on file to calculate BMI. Physical Exam Vitals and nursing note reviewed.  Constitutional:      Appearance: Normal appearance.  HENT:     Head: Normocephalic and atraumatic.     Nose: No  congestion or rhinorrhea.     Mouth/Throat:     Mouth: Mucous membranes are moist.  Eyes:     Extraocular Movements: Extraocular movements intact.     Conjunctiva/sclera: Conjunctivae normal.     Pupils: Pupils are equal, round, and reactive to light.  Cardiovascular:     Rate and Rhythm: Normal rate and regular rhythm.     Heart sounds: No murmur heard. Pulmonary:     Effort: Pulmonary effort is normal.     Breath sounds: No rales.  Abdominal:     General: Bowel sounds are normal.     Palpations: Abdomen is soft.     Tenderness: There is no abdominal tenderness.     Comments: Colostomy  Musculoskeletal:        General: Swelling and tenderness present.     Cervical back: Normal range of motion and neck supple.     Right lower leg: Edema present.     Left lower leg: Edema present.     Comments: Trace edema in ankles. Left wrist swelling, redness, mild warmth  Skin:    General: Skin is warm and dry.     Comments: Mid abd surgical scar. Colostomy is functioning.   Neurological:     General: No focal deficit present.     Mental Status: She is alert and oriented to person, place, and time. Mental status is at baseline.     Gait: Gait abnormal.  Psychiatric:        Mood and Affect: Mood normal.        Behavior: Behavior normal.        Thought Content: Thought content normal.        Judgment: Judgment normal.  Labs reviewed: Recent Labs    12/12/20 0325 12/13/20 0430 12/14/20 0410 12/21/20 0000 12/24/20 0000 01/24/21 2359  NA 138 136 141 141 138 137  K 4.5 3.8 3.6 2.7* 3.5 4.3  CL 109 103 105 103 103 102  CO2 25 26 26  28* 29* 27*  GLUCOSE 122* 134* 116*  --   --   --   BUN 6* 9 13 10 10 17   CREATININE 0.34* 0.42* 0.45 0.4* 0.4* 0.6  CALCIUM 8.4* 8.7* 8.9 8.5* 9.1 9.0  MG 2.2 1.8 1.7 1.8  --   --   PHOS 3.4 4.2 4.3  --   --   --    Recent Labs    12/10/20 0552 12/11/20 0405 12/12/20 0325 12/21/20 0000 01/24/21 2359  AST 16 17 17  12* 14  ALT 12 12 12 9 12    ALKPHOS 42 41 45 65 74  BILITOT 0.6 0.5 0.3  --   --   PROT 5.2* 5.2* 5.3*  --   --   ALBUMIN 2.2* 2.2* 2.2* 2.9* 3.0*   Recent Labs    12/09/20 0446 12/12/20 0325 12/14/20 0410 12/21/20 0000 12/29/20 0000 01/24/21 2359  WBC 6.9 7.9 8.8 4.3 5.5 4.5  NEUTROABS 5.1 6.0  --  2,683.00  --  2,547.00  HGB 8.1* 7.4* 7.2* 7.6* 10.0* 11.0*  HCT 25.5* 24.3* 23.6* 26* 33* 36  MCV 88.2 93.1 94.0  --   --   --   PLT 172 144* 170 195 201 104*   Lab Results  Component Value Date   TSH 1.40 12/21/2020   Lab Results  Component Value Date   HGBA1C 5.9 (H) 09/26/2015   Lab Results  Component Value Date   CHOL 136 12/21/2020   HDL 93 (A) 12/21/2020   LDLCALC 72 12/21/2020   LDLDIRECT 147.0 11/17/2012   TRIG 119 12/21/2020   CHOLHDL 3 02/10/2020    Significant Diagnostic Results in last 30 days:  No results found.  Assessment/Plan: Left wrist pain he left wrist pain, tingling of fingers, slightly warmth and swelling, denied trauma, admitted its on and off in the past. On Tylenol, declined stronger analgesics for pain. 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, s/p Hartman's procedure, splenic flexure mobilization  Essential hypertension Blood pressure is controlled,  takes Amlodipine Bun/creat 17/0.6 01/24/21  Hypomagnesemia Mg 1.8 12/20/20  Vitamin B12 deficiency takes Vit B, Vit B12 942  Cerebral infarction (HCC) w/o residual deficit  Hyperlipidemia LDL goal <70 takes Rosuvastatin, LDL 72 12/20/20  Depression, recurrent ([place]) Her mood is stable,  takes Venlafaxine  Slow transit constipation Stable, , takes MiraLax Colace  Blood loss anemia , takes Fe Hgb 11 01/24/21  Hypokalemia  supplemented, takes Kcl, serum K 4.3 01/24/21    Family/ staff Communication: plan of care reviewed with the patient and charge nurse.   Labs/tests ordered:  X-ray left wrist 3  vies, CBC/diff, CMP/eGFR, uric acid, ESR, CRP  Time spend 40 minutes.

## 2021-11-20 NOTE — Assessment & Plan Note (Signed)
due to diverticulitis with distal sigmoid obstruction, s/p Hartman's procedure, splenic flexure mobilization ?

## 2021-11-20 NOTE — Assessment & Plan Note (Signed)
Mg 1.8 12/20/20 

## 2021-11-20 NOTE — Assessment & Plan Note (Signed)
takes Rosuvastatin, LDL 72 12/20/20 

## 2021-11-20 NOTE — Assessment & Plan Note (Signed)
he left wrist pain, tingling of fingers, slightly warmth and swelling, denied trauma, admitted its on and off in the past. On Tylenol, declined stronger analgesics for pain. Obtain X-ray left wrist 3 vies, CBC/diff, CMP/eGFR, uric acid, ESR, CRP ?

## 2021-11-20 NOTE — Assessment & Plan Note (Signed)
Stable, , takes MiraLax Colace ?

## 2021-11-20 NOTE — Assessment & Plan Note (Signed)
,   takes Fe Hgb 11 01/24/21 ?

## 2021-11-20 NOTE — Assessment & Plan Note (Signed)
Her mood is stable,  takes Venlafaxine 

## 2021-11-20 NOTE — Assessment & Plan Note (Signed)
takes Vit B, Vit B12 942 

## 2021-11-20 NOTE — Assessment & Plan Note (Signed)
w/o residual deficit ?

## 2021-11-21 DIAGNOSIS — I10 Essential (primary) hypertension: Secondary | ICD-10-CM | POA: Diagnosis not present

## 2021-11-22 LAB — CBC AND DIFFERENTIAL
HCT: 44 (ref 36–46)
Hemoglobin: 14.4 (ref 12.0–16.0)
Neutrophils Absolute: 2900
Platelets: 119 10*3/uL — AB (ref 150–400)
WBC: 5.4

## 2021-11-22 LAB — BASIC METABOLIC PANEL
BUN: 19 (ref 4–21)
CO2: 26 — AB (ref 13–22)
Chloride: 104 (ref 99–108)
Creatinine: 0.6 (ref 0.5–1.1)
Glucose: 94
Potassium: 3.6 mEq/L (ref 3.5–5.1)
Sodium: 143 (ref 137–147)

## 2021-11-22 LAB — CBC: RBC: 4.66 (ref 3.87–5.11)

## 2021-11-22 LAB — HEPATIC FUNCTION PANEL
ALT: 12 U/L (ref 7–35)
AST: 15 (ref 13–35)
Alkaline Phosphatase: 65 (ref 25–125)
Bilirubin, Total: 0.7

## 2021-11-22 LAB — COMPREHENSIVE METABOLIC PANEL
Albumin: 4 (ref 3.5–5.0)
Calcium: 9.4 (ref 8.7–10.7)
Globulin: 3.2

## 2021-11-22 LAB — POCT ERYTHROCYTE SEDIMENTATION RATE, NON-AUTOMATED: Sed Rate: 6

## 2021-11-28 ENCOUNTER — Encounter: Payer: Self-pay | Admitting: Internal Medicine

## 2021-11-28 ENCOUNTER — Non-Acute Institutional Stay: Payer: Medicare Other | Admitting: Internal Medicine

## 2021-11-28 DIAGNOSIS — I10 Essential (primary) hypertension: Secondary | ICD-10-CM

## 2021-11-28 DIAGNOSIS — Z933 Colostomy status: Secondary | ICD-10-CM | POA: Diagnosis not present

## 2021-11-28 DIAGNOSIS — E538 Deficiency of other specified B group vitamins: Secondary | ICD-10-CM | POA: Diagnosis not present

## 2021-11-28 DIAGNOSIS — E785 Hyperlipidemia, unspecified: Secondary | ICD-10-CM

## 2021-11-28 DIAGNOSIS — D500 Iron deficiency anemia secondary to blood loss (chronic): Secondary | ICD-10-CM

## 2021-11-28 DIAGNOSIS — F339 Major depressive disorder, recurrent, unspecified: Secondary | ICD-10-CM

## 2021-11-28 DIAGNOSIS — M25532 Pain in left wrist: Secondary | ICD-10-CM

## 2021-11-28 DIAGNOSIS — Z20822 Contact with and (suspected) exposure to covid-19: Secondary | ICD-10-CM | POA: Diagnosis not present

## 2021-11-28 LAB — URIC ACID: Uric Acid: 4.5

## 2021-11-28 LAB — C-REACTIVE PROTEIN: CRP: 3

## 2021-11-28 NOTE — Progress Notes (Signed)
? ?Location:  [facility] ?Nursing Home Room Number: 782 ?Place of Service:  ALF (13) ? ?Provider:  ? ?Code Status: Full Code ?Goals of Care:  ?Advanced Directives 09/01/2021  ?Does Patient Have a Medical Advance Directive? Yes  ?Type of Paramedic of [place];Living will  ?Does patient want to make changes to medical advance directive? No - Patient declined  ?Copy of [place] in Chart? Yes - validated most recent copy scanned in chart (See row information)  ?Would patient like information on creating a medical advance directive? -  ? ? ? ?Chief Complaint  ?Patient presents with  ? Medical Management of Chronic Issues  ? Quality Metric Gaps  ?  Verified [facility] and [facility] patient is due for Shingrix and TDAP  ? ? ?HPI: Patient is a 88 y.o. female seen today for medical management of chronic diseases.   ? ?Patient has a history of hypertension, hyperlipidemia, depression, history of CVA ?Underwent exploratory laparotomy with Hartmann procedure and end colostomy for diverticulitis with distal sigmoid obstruction 03/22 ? ?Now in AL ?Active complains ?Left Wrist Pain and swelling ?Xray negative for any acute fracture ? TFCC Tear ?She says she feels pain when she is trying to walk with her walker ?No History of fall ?Pain controlled with tylenol but does not want to take that much Tylenol ? ?Otherwise doing well Managing well in AL ?Wt Readings from Last 3 Encounters:  ?11/28/21 145 lb 6.4 oz (66 kg)  ?11/17/21 145 lb 6.4 oz (66 kg)  ?09/01/21 145 lb 6.4 oz (66 kg)  ?  ?Wants Boost stopped ?No Falls ? ? ? ? ? ?Past Medical History:  ?Diagnosis Date  ? Arthritis   ? Depression   ? Hyperlipidemia   ? Osteopenia   ? Stroke [facility])   ? Thrombocytopenia ([place])   ? Vitamin D deficiency   ? Wrist fracture 2002  ? left  ? ? ?Past Surgical History:  ?Procedure Laterality Date  ? APPENDECTOMY  1967  ? CARPAL TUNNEL RELEASE  2008  ? right  ? COLON RESECTION SIGMOID N/A 12/03/2020  ?  Procedure: EXPLORATORY LAPAROTOMY; HARTMAN'S PROCEDURE; SPLEENIC FLEXURE MOBILIZATION;  Surgeon: Leighton Ruff, MD;  Location: [facility];  Service: General;  Laterality: N/A;  ? COLOSTOMY N/A 12/03/2020  ? Procedure: COLOSTOMY;  Surgeon: Leighton Ruff, MD;  Location: [facility];  Service: General;  Laterality: N/A;  ? JOINT REPLACEMENT  2002  ? right total   ? ? ?Allergies  ?Allergen Reactions  ? Bactrim [Sulfamethoxazole-Trimethoprim] Nausea Only  ? Ditropan [Oxybutynin]   ?  Made patient feel bad  ? Sulfa Antibiotics   ?  Other reaction(s): stomach upset  ? ? ?Outpatient Encounter Medications as of 11/28/2021  ?Medication Sig  ? acetaminophen (TYLENOL) 325 MG tablet Take 2 tablets (650 mg total) by mouth every 6 (six) hours as needed for mild pain or moderate pain.  ? alum & mag hydroxide-simeth (MAALOX/MYLANTA) 200-200-20 MG/5ML suspension Take 30 mLs by mouth every 4 (four) hours as needed for indigestion or heartburn.  ? Amino Acids-Protein Hydrolys (FEEDING SUPPLEMENT, PRO-STAT SUGAR FREE 64,) LIQD Take 30 mLs by mouth daily.  ? amLODipine (NORVASC) 2.5 MG tablet Take 2.5 mg by mouth daily.  ? aspirin EC 325 MG EC tablet Take 1 tablet (325 mg total) by mouth daily.  ? Biotin 10 MG TABS Take 10 mg by mouth daily.   ? Charcoal Activated (CHARCOCAPS) 260 MG CAPS Take 1 capsule by mouth every  6 (six) hours as needed (For gas).  ? CHELATED MAGNESIUM PO Take 300 mg by mouth daily.  ? Cholecalciferol (VITAMIN D3) 2000 units capsule Take 2,000 Units by mouth daily.  ? Ferrous Sulfate (IRON PO) Take 325 mg by mouth daily.  ? fluticasone (FLONASE) 50 MCG/ACT nasal spray Place 1 spray into both nostrils 2 (two) times daily.  ? latanoprost (XALATAN) 0.005 % ophthalmic solution Place 1 drop into the left eye at bedtime.  ? loperamide (IMODIUM A-D) 2 MG tablet Take 2 mg by mouth as needed for diarrhea or loose stools.  ? magnesium hydroxide (MILK OF MAGNESIA) 400 MG/5ML suspension Take 30 mLs by mouth daily as needed for mild  constipation.  ? minoxidil (ROGAINE) 2 % external solution Apply topically daily as needed.  ? Nutritional Supplements (FEEDING SUPPLEMENT, BOOST BREEZE,) LIQD Take 237 mLs by mouth 3 (three) times daily between meals.  ? polyethylene glycol (MIRALAX / GLYCOLAX) 17 g packet Take 17 g by mouth daily.  ? potassium chloride SA (KLOR-CON) 20 MEQ tablet Take 20 mEq by mouth daily.  ? rosuvastatin (CRESTOR) 10 MG tablet TAKE 1 TABLET ONCE DAILY.  ? venlafaxine XR (EFFEXOR-XR) 75 MG 24 hr capsule TAKE 2 CAPSULES ('150MG'$ ) BY MOUTH DAILY.  ? vitamin B-12 (CYANOCOBALAMIN) 1000 MCG tablet Take 1,000 mcg by mouth daily.  ? Vitamin E 45 MG (100 UNIT) CAPS Take 1 capsule by mouth daily.  ? ?No facility-administered encounter medications on file as of 11/28/2021.  ? ? ?Review of Systems:  ?Review of Systems  ?Constitutional:  Negative for activity change and appetite change.  ?HENT: Negative.    ?Respiratory:  Negative for cough and shortness of breath.   ?Cardiovascular:  Negative for leg swelling.  ?Gastrointestinal:  Negative for constipation.  ?Genitourinary: Negative.   ?Musculoskeletal:  Positive for arthralgias and joint swelling. Negative for gait problem and myalgias.  ?Skin: Negative.   ?Neurological:  Negative for dizziness and weakness.  ?Psychiatric/Behavioral:  Negative for confusion, dysphoric mood and sleep disturbance.   ? ?Health Maintenance  ?Topic Date Due  ? Zoster Vaccines- Shingrix (1 of 2) Never done  ? TETANUS/TDAP  09/11/2015  ? COVID-19 Vaccine (4 - Booster) 07/31/2022 (Originally 07/25/2021)  ? Pneumonia Vaccine 19+ Years old  Completed  ? INFLUENZA VACCINE  Completed  ? DEXA SCAN  Completed  ? HPV VACCINES  Aged Out  ? ? ?Physical Exam: ?Vitals:  ? 11/28/21 1507  ?BP: (!) 146/82  ?Pulse: 79  ?Resp: 18  ?Temp: 98 ?F (36.7 ?C)  ?SpO2: 95%  ?Weight: 145 lb 6.4 oz (66 kg)  ?Height: '5\' 3"'$  (1.6 m)  ? ?Body mass index is 25.76 kg/m?Marland Kitchen ?Physical Exam ?Vitals reviewed.  ?Constitutional:   ?   Appearance: Normal  appearance.  ?HENT:  ?   Head: Normocephalic.  ?   Nose: Nose normal.  ?   Mouth/Throat:  ?   Mouth: Mucous membranes are moist.  ?   Pharynx: Oropharynx is clear.  ?Eyes:  ?   Pupils: Pupils are equal, round, and reactive to light.  ?Cardiovascular:  ?   Rate and Rhythm: Normal rate and regular rhythm.  ?   Pulses: Normal pulses.  ?   Heart sounds: Normal heart sounds. No murmur heard. ?Pulmonary:  ?   Effort: Pulmonary effort is normal.  ?   Breath sounds: Normal breath sounds.  ?Abdominal:  ?   General: Abdomen is flat. Bowel sounds are normal.  ?   Palpations: Abdomen is soft.  ?  Musculoskeletal:     ?   General: No swelling.  ?   Cervical back: Neck supple.  ?   Comments: Mild swelling in Her Left Wrist below the Thumb. Mildily tender  ?Skin: ?   General: Skin is warm.  ?Neurological:  ?   General: No focal deficit present.  ?   Mental Status: She is alert and oriented to person, place, and time.  ?Psychiatric:     ?   Mood and Affect: Mood normal.     ?   Thought Content: Thought content normal.  ? ? ?Labs reviewed: ?Basic Metabolic Panel: ?Recent Labs  ?  12/12/20 ?0325 12/13/20 ?0430 12/14/20 ?0410 12/21/20 ?0000 12/24/20 ?0000 01/24/21 ?2359  ?NA 138 136 141 141 138 137  ?K 4.5 3.8 3.6 2.7* 3.5 4.3  ?CL 109 103 105 103 103 102  ?CO2 '25 26 26 '$ 28* 29* 27*  ?GLUCOSE 122* 134* 116*  --   --   --   ?BUN 6* '9 13 10 10 17  '$ ?CREATININE 0.34* 0.42* 0.45 0.4* 0.4* 0.6  ?CALCIUM 8.4* 8.7* 8.9 8.5* 9.1 9.0  ?MG 2.2 1.8 1.7 1.8  --   --   ?PHOS 3.4 4.2 4.3  --   --   --   ?TSH  --   --   --  1.40  --   --   ? ?Liver Function Tests: ?Recent Labs  ?  12/10/20 ?7903 12/11/20 ?0405 12/12/20 ?0325 12/21/20 ?0000 01/24/21 ?2359  ?AST '16 17 17 '$ 12* 14  ?ALT '12 12 12 9 12  '$ ?ALKPHOS 42 41 45 65 74  ?BILITOT 0.6 0.5 0.3  --   --   ?PROT 5.2* 5.2* 5.3*  --   --   ?ALBUMIN 2.2* 2.2* 2.2* 2.9* 3.0*  ? ?Recent Labs  ?  12/02/20 ?1143  ?LIPASE 32  ? ?No results for input(s): AMMONIA in the last 8760 hours. ?CBC: ?Recent Labs  ?   12/09/20 ?0446 12/12/20 ?0325 12/14/20 ?0410 12/21/20 ?0000 12/29/20 ?0000 01/24/21 ?2359  ?WBC 6.9 7.9 8.8 4.3 5.5 4.5  ?NEUTROABS 5.1 6.0  --  2,683.00  --  2,547.00  ?HGB 8.1* 7.4* 7.2* 7.6* 10.0* 11.0*  ?HCT 25

## 2021-11-29 DIAGNOSIS — R29898 Other symptoms and signs involving the musculoskeletal system: Secondary | ICD-10-CM | POA: Diagnosis not present

## 2021-11-29 DIAGNOSIS — M79642 Pain in left hand: Secondary | ICD-10-CM | POA: Diagnosis not present

## 2021-11-29 DIAGNOSIS — M25542 Pain in joints of left hand: Secondary | ICD-10-CM | POA: Diagnosis not present

## 2021-11-29 DIAGNOSIS — M6281 Muscle weakness (generalized): Secondary | ICD-10-CM | POA: Diagnosis not present

## 2021-11-29 DIAGNOSIS — R2232 Localized swelling, mass and lump, left upper limb: Secondary | ICD-10-CM | POA: Diagnosis not present

## 2021-11-29 DIAGNOSIS — M25532 Pain in left wrist: Secondary | ICD-10-CM | POA: Diagnosis not present

## 2021-11-30 ENCOUNTER — Telehealth: Payer: Self-pay

## 2021-11-30 NOTE — Telephone Encounter (Signed)
Sara Logan with [facility] called to inquire about an order from September 2022. Plan of Care Order was returned but not signed or dated. ? ?I advised Sara Logan to refax the order and we will have Dr.Gupta address.  ?

## 2021-12-05 DIAGNOSIS — M6281 Muscle weakness (generalized): Secondary | ICD-10-CM | POA: Diagnosis not present

## 2021-12-05 DIAGNOSIS — R29898 Other symptoms and signs involving the musculoskeletal system: Secondary | ICD-10-CM | POA: Diagnosis not present

## 2021-12-05 DIAGNOSIS — M25532 Pain in left wrist: Secondary | ICD-10-CM | POA: Diagnosis not present

## 2021-12-05 DIAGNOSIS — M25542 Pain in joints of left hand: Secondary | ICD-10-CM | POA: Diagnosis not present

## 2021-12-08 DIAGNOSIS — M25532 Pain in left wrist: Secondary | ICD-10-CM | POA: Diagnosis not present

## 2021-12-08 DIAGNOSIS — M6281 Muscle weakness (generalized): Secondary | ICD-10-CM | POA: Diagnosis not present

## 2021-12-08 DIAGNOSIS — R29898 Other symptoms and signs involving the musculoskeletal system: Secondary | ICD-10-CM | POA: Diagnosis not present

## 2021-12-08 DIAGNOSIS — M25542 Pain in joints of left hand: Secondary | ICD-10-CM | POA: Diagnosis not present

## 2021-12-11 DIAGNOSIS — M25532 Pain in left wrist: Secondary | ICD-10-CM | POA: Diagnosis not present

## 2021-12-11 DIAGNOSIS — M25542 Pain in joints of left hand: Secondary | ICD-10-CM | POA: Diagnosis not present

## 2021-12-11 DIAGNOSIS — R29898 Other symptoms and signs involving the musculoskeletal system: Secondary | ICD-10-CM | POA: Diagnosis not present

## 2021-12-11 DIAGNOSIS — M6281 Muscle weakness (generalized): Secondary | ICD-10-CM | POA: Diagnosis not present

## 2021-12-14 DIAGNOSIS — M25532 Pain in left wrist: Secondary | ICD-10-CM | POA: Diagnosis not present

## 2021-12-14 DIAGNOSIS — M6281 Muscle weakness (generalized): Secondary | ICD-10-CM | POA: Diagnosis not present

## 2021-12-14 DIAGNOSIS — R29898 Other symptoms and signs involving the musculoskeletal system: Secondary | ICD-10-CM | POA: Diagnosis not present

## 2021-12-14 DIAGNOSIS — M25542 Pain in joints of left hand: Secondary | ICD-10-CM | POA: Diagnosis not present

## 2021-12-19 DIAGNOSIS — R29898 Other symptoms and signs involving the musculoskeletal system: Secondary | ICD-10-CM | POA: Diagnosis not present

## 2021-12-19 DIAGNOSIS — M6281 Muscle weakness (generalized): Secondary | ICD-10-CM | POA: Diagnosis not present

## 2021-12-19 DIAGNOSIS — M25542 Pain in joints of left hand: Secondary | ICD-10-CM | POA: Diagnosis not present

## 2021-12-19 DIAGNOSIS — M25532 Pain in left wrist: Secondary | ICD-10-CM | POA: Diagnosis not present

## 2021-12-21 DIAGNOSIS — M25532 Pain in left wrist: Secondary | ICD-10-CM | POA: Diagnosis not present

## 2021-12-21 DIAGNOSIS — M6281 Muscle weakness (generalized): Secondary | ICD-10-CM | POA: Diagnosis not present

## 2021-12-21 DIAGNOSIS — M25542 Pain in joints of left hand: Secondary | ICD-10-CM | POA: Diagnosis not present

## 2021-12-21 DIAGNOSIS — R29898 Other symptoms and signs involving the musculoskeletal system: Secondary | ICD-10-CM | POA: Diagnosis not present

## 2021-12-25 DIAGNOSIS — F330 Major depressive disorder, recurrent, mild: Secondary | ICD-10-CM | POA: Diagnosis not present

## 2021-12-26 DIAGNOSIS — M25532 Pain in left wrist: Secondary | ICD-10-CM | POA: Diagnosis not present

## 2021-12-26 DIAGNOSIS — M6281 Muscle weakness (generalized): Secondary | ICD-10-CM | POA: Diagnosis not present

## 2021-12-26 DIAGNOSIS — M25542 Pain in joints of left hand: Secondary | ICD-10-CM | POA: Diagnosis not present

## 2021-12-26 DIAGNOSIS — R29898 Other symptoms and signs involving the musculoskeletal system: Secondary | ICD-10-CM | POA: Diagnosis not present

## 2021-12-28 DIAGNOSIS — M25532 Pain in left wrist: Secondary | ICD-10-CM | POA: Diagnosis not present

## 2021-12-28 DIAGNOSIS — M6281 Muscle weakness (generalized): Secondary | ICD-10-CM | POA: Diagnosis not present

## 2021-12-28 DIAGNOSIS — R29898 Other symptoms and signs involving the musculoskeletal system: Secondary | ICD-10-CM | POA: Diagnosis not present

## 2021-12-28 DIAGNOSIS — M25542 Pain in joints of left hand: Secondary | ICD-10-CM | POA: Diagnosis not present

## 2022-01-02 DIAGNOSIS — M25532 Pain in left wrist: Secondary | ICD-10-CM | POA: Diagnosis not present

## 2022-01-02 DIAGNOSIS — R29898 Other symptoms and signs involving the musculoskeletal system: Secondary | ICD-10-CM | POA: Diagnosis not present

## 2022-01-02 DIAGNOSIS — M6281 Muscle weakness (generalized): Secondary | ICD-10-CM | POA: Diagnosis not present

## 2022-01-02 DIAGNOSIS — M25542 Pain in joints of left hand: Secondary | ICD-10-CM | POA: Diagnosis not present

## 2022-01-04 DIAGNOSIS — M6281 Muscle weakness (generalized): Secondary | ICD-10-CM | POA: Diagnosis not present

## 2022-01-04 DIAGNOSIS — M25542 Pain in joints of left hand: Secondary | ICD-10-CM | POA: Diagnosis not present

## 2022-01-04 DIAGNOSIS — M25532 Pain in left wrist: Secondary | ICD-10-CM | POA: Diagnosis not present

## 2022-01-04 DIAGNOSIS — R29898 Other symptoms and signs involving the musculoskeletal system: Secondary | ICD-10-CM | POA: Diagnosis not present

## 2022-01-08 DIAGNOSIS — M25532 Pain in left wrist: Secondary | ICD-10-CM | POA: Diagnosis not present

## 2022-01-08 DIAGNOSIS — R29898 Other symptoms and signs involving the musculoskeletal system: Secondary | ICD-10-CM | POA: Diagnosis not present

## 2022-01-08 DIAGNOSIS — M6281 Muscle weakness (generalized): Secondary | ICD-10-CM | POA: Diagnosis not present

## 2022-01-08 DIAGNOSIS — M25542 Pain in joints of left hand: Secondary | ICD-10-CM | POA: Diagnosis not present

## 2022-01-10 DIAGNOSIS — Z20822 Contact with and (suspected) exposure to covid-19: Secondary | ICD-10-CM | POA: Diagnosis not present

## 2022-01-11 DIAGNOSIS — R29898 Other symptoms and signs involving the musculoskeletal system: Secondary | ICD-10-CM | POA: Diagnosis not present

## 2022-01-11 DIAGNOSIS — M6281 Muscle weakness (generalized): Secondary | ICD-10-CM | POA: Diagnosis not present

## 2022-01-11 DIAGNOSIS — M25532 Pain in left wrist: Secondary | ICD-10-CM | POA: Diagnosis not present

## 2022-01-11 DIAGNOSIS — M25542 Pain in joints of left hand: Secondary | ICD-10-CM | POA: Diagnosis not present

## 2022-01-26 DIAGNOSIS — Z23 Encounter for immunization: Secondary | ICD-10-CM | POA: Diagnosis not present

## 2022-01-30 DIAGNOSIS — L570 Actinic keratosis: Secondary | ICD-10-CM | POA: Diagnosis not present

## 2022-01-30 DIAGNOSIS — L814 Other melanin hyperpigmentation: Secondary | ICD-10-CM | POA: Diagnosis not present

## 2022-01-30 DIAGNOSIS — L820 Inflamed seborrheic keratosis: Secondary | ICD-10-CM | POA: Diagnosis not present

## 2022-01-30 DIAGNOSIS — L821 Other seborrheic keratosis: Secondary | ICD-10-CM | POA: Diagnosis not present

## 2022-02-27 DIAGNOSIS — F330 Major depressive disorder, recurrent, mild: Secondary | ICD-10-CM | POA: Diagnosis not present

## 2022-03-01 DIAGNOSIS — A0472 Enterocolitis due to Clostridium difficile, not specified as recurrent: Secondary | ICD-10-CM | POA: Diagnosis not present

## 2022-03-09 ENCOUNTER — Encounter: Payer: Self-pay | Admitting: Adult Health

## 2022-03-09 ENCOUNTER — Non-Acute Institutional Stay: Payer: Medicare Other | Admitting: Adult Health

## 2022-03-09 DIAGNOSIS — I10 Essential (primary) hypertension: Secondary | ICD-10-CM

## 2022-03-09 DIAGNOSIS — Z933 Colostomy status: Secondary | ICD-10-CM | POA: Diagnosis not present

## 2022-03-09 DIAGNOSIS — E538 Deficiency of other specified B group vitamins: Secondary | ICD-10-CM | POA: Diagnosis not present

## 2022-03-09 DIAGNOSIS — I63342 Cerebral infarction due to thrombosis of left cerebellar artery: Secondary | ICD-10-CM

## 2022-03-09 DIAGNOSIS — Z8673 Personal history of transient ischemic attack (TIA), and cerebral infarction without residual deficits: Secondary | ICD-10-CM | POA: Diagnosis not present

## 2022-03-09 DIAGNOSIS — D649 Anemia, unspecified: Secondary | ICD-10-CM

## 2022-03-09 DIAGNOSIS — F339 Major depressive disorder, recurrent, unspecified: Secondary | ICD-10-CM

## 2022-03-09 NOTE — Progress Notes (Signed)
Location:  [place] Room Number: AL913/A Place of Service:  ALF [phone]) Provider:  Durenda Age, DNP, FNP-BC  Patient Care Team: Virgie Dad, MD as PCP - General (Internal Medicine) Viona Gilmore, [facility] as Pharmacist (Pharmacist)  Extended Emergency Contact Information Primary Emergency Contact: [employer] Phone: [phone] Relation: Daughter  Code Status:  Full Code  Goals of care: Advanced Directive information    03/09/2022    2:30 PM  Advanced Directives  Does Patient Have a Medical Advance Directive? Yes  Type of Paramedic of [employer];Living will  Does patient want to make changes to medical advance directive? No - Patient declined  Copy of [place] in Chart? Yes - validated most recent copy scanned in chart (See row information)     Chief Complaint  Patient presents with   Medical Management of Chronic Issues    Routine Visit    HPI:  Pt is a 88 y.o. female seen today for medical management of chronic diseases. She is a resident of [facility].  She has a PMH of hypertension, hyperlipidemia, diverticulitis S/P colostomy, constipation, cerebral infarction, osteopenia, incontinence and depression.  Essential hypertension  -  SBPs ranging from 125-158, takes amlodipine 2.5 mg daily  History of CVA (cerebrovascular accident)  -    no residual deficit, takes aspirin 325 mg daily and rosuvastatin 10 mg daily  Anemia, unspecified type  -   hemoglobin 14.4, 11/21/2021, takes iron 325 mg daily on MWF  Vitamin B12 deficiency  -   takes vitamin B12 1000 mcg daily  Depression, recurrent (HCC)  -PHQ-9 score 0, takes venlafaxine 24-hour 150 mg daily  Colostomy status ([place])  - self colostomy care done daily    Past Medical History:  Diagnosis Date   Arthritis    Depression    Hyperlipidemia    Osteopenia    Stroke ([place])     Thrombocytopenia ([place])    Vitamin D deficiency    Wrist fracture 2002   left   Past Surgical History:  Procedure Laterality Date   APPENDECTOMY  1967   CARPAL TUNNEL RELEASE  2008   right   COLON RESECTION SIGMOID N/A 12/03/2020   Procedure: EXPLORATORY LAPAROTOMY; HARTMAN'S PROCEDURE; SPLEENIC FLEXURE MOBILIZATION;  Surgeon: Leighton Ruff, MD;  Location: [facility];  Service: General;  Laterality: N/A;   COLOSTOMY N/A 12/03/2020   Procedure: COLOSTOMY;  Surgeon: Leighton Ruff, MD;  Location: [facility];  Service: General;  Laterality: N/A;   JOINT REPLACEMENT  2002   right total     Allergies  Allergen Reactions   Bactrim [Sulfamethoxazole-Trimethoprim] Nausea Only   Ditropan [Oxybutynin]     Made patient feel bad   Sulfa Antibiotics     Other reaction(s): stomach upset    Outpatient Encounter Medications as of 03/09/2022  Medication Sig   acetaminophen (TYLENOL) 325 MG tablet Take 2 tablets (650 mg total) by mouth every 6 (six) hours as needed for mild pain or moderate pain.   acetaminophen (TYLENOL) 500 MG tablet Take 1,000 mg by mouth 2 (two) times daily.   alum & mag hydroxide-simeth (MAALOX/MYLANTA) 200-200-20 MG/5ML suspension Take 30 mLs by mouth every 4 (four) hours as needed for indigestion or heartburn.   amLODipine (NORVASC) 2.5 MG tablet Take 2.5 mg by mouth daily.   aspirin EC 325 MG EC tablet Take 1 tablet (325 mg total) by mouth daily.   Biotin 10 MG  TABS Take 10 mg by mouth daily.    calcium carbonate (OSCAL) 1500 (600 Ca) MG TABS tablet Take 600 mg of elemental calcium by mouth daily.   Charcoal Activated 260 MG CAPS Take 1 capsule by mouth every 6 (six) hours as needed (For gas).   CHELATED MAGNESIUM PO Take 300 mg by mouth daily.   Cholecalciferol (VITAMIN D3) 2000 units capsule Take 2,000 Units by mouth daily.   diclofenac Sodium (VOLTAREN ARTHRITIS PAIN) 1 % GEL Apply 2 g topically 3 (three) times daily as needed (Left wrist).   Ferrous Sulfate (IRON PO) Take 325  mg by mouth daily.   fluticasone (FLONASE) 50 MCG/ACT nasal spray Place 1 spray into both nostrils 2 (two) times daily.   latanoprost (XALATAN) 0.005 % ophthalmic solution Place 1 drop into the left eye at bedtime.   loperamide (IMODIUM A-D) 2 MG tablet Take 2 mg by mouth as needed for diarrhea or loose stools.   magnesium hydroxide (MILK OF MAGNESIA) 400 MG/5ML suspension Take 30 mLs by mouth daily as needed for mild constipation.   minoxidil (ROGAINE) 2 % external solution Apply topically daily as needed.   polyethylene glycol (MIRALAX / GLYCOLAX) 17 g packet Take 17 g by mouth daily.   potassium chloride SA (KLOR-CON) 20 MEQ tablet Take 20 mEq by mouth daily.   rosuvastatin (CRESTOR) 10 MG tablet TAKE 1 TABLET ONCE DAILY.   simethicone (MYLICON) 80 MG chewable tablet Chew 80 mg by mouth 4 (four) times daily as needed.   venlafaxine XR (EFFEXOR-XR) 75 MG 24 hr capsule TAKE 2 CAPSULES ('150MG'$ ) BY MOUTH DAILY.   vitamin B-12 (CYANOCOBALAMIN) 1000 MCG tablet Take 1,000 mcg by mouth daily.   Vitamin E 45 MG (100 UNIT) CAPS Take 1 capsule by mouth daily.   [DISCONTINUED] Amino Acids-Protein Hydrolys (FEEDING SUPPLEMENT, PRO-STAT SUGAR FREE 64,) LIQD Take 30 mLs by mouth daily.   [DISCONTINUED] Nutritional Supplements (FEEDING SUPPLEMENT, BOOST BREEZE,) LIQD Take 237 mLs by mouth 3 (three) times daily between meals.   No facility-administered encounter medications on file as of 03/09/2022.    Review of Systems  Constitutional:  Negative for appetite change, chills, fatigue and fever.  HENT:  Negative for congestion, hearing loss, rhinorrhea and sore throat.   Eyes: Negative.   Respiratory:  Negative for cough, shortness of breath and wheezing.   Cardiovascular:  Negative for chest pain, palpitations and leg swelling.  Gastrointestinal:  Negative for abdominal pain, nausea and vomiting.  Genitourinary:  Negative for dysuria.  Musculoskeletal:  Negative for arthralgias, back pain and myalgias.   Skin:  Negative for color change, rash and wound.  Neurological:  Negative for dizziness, weakness and headaches.  Psychiatric/Behavioral:  Negative for behavioral problems. The patient is not nervous/anxious.        Immunization History  Administered Date(s) Administered   Fluad Quad(high Dose 65+) 06/13/2019   Influenza Split 06/18/2012   Influenza Whole 07/11/2010   Influenza, High Dose Seasonal PF 07/07/2013, 07/14/2014, 06/24/2015, 06/13/2016, 06/04/2017, 06/17/2018   Influenza-Unspecified 06/22/2020, 06/29/2021   Moderna Covid-19 Vaccine Bivalent Booster 6yr & up 01/26/2022   Moderna SARS-COV2 Booster Vaccination 07/19/2020, 02/07/2021   Moderna Sars-Covid-2 Vaccination 09/14/2019, 10/12/2019   PFIZER(Purple Top)SARS-COV-2 Vaccination 05/30/2021   Pneumococcal Conjugate-13 04/27/2014   Pneumococcal Polysaccharide-23 10/15/2017   Td 09/10/2005   Zoster Recombinat (Shingrix) 02/28/2022   Zoster, Live 11/29/2006   Pertinent  Health Maintenance Due  Topic Date Due   INFLUENZA VACCINE  04/10/2022   DEXA SCAN  Completed  12/13/2020    3:00 PM 12/14/2020    5:00 AM 12/14/2020    8:00 AM 12/14/2020    8:29 PM 12/15/2020    8:44 AM  Fall Risk  Patient Fall Risk Level Moderate fall risk Moderate fall risk Moderate fall risk Moderate fall risk High fall risk     Vitals:   03/09/22 1419  BP: (!) 158/86  Pulse: 97  Resp: 18  Temp: 97.9 F (36.6 C)  SpO2: 95%  Weight: 160 lb 9.6 oz (72.8 kg)  Height: '5\' 3"'$  (1.6 m)   Body mass index is 28.45 kg/m.  Physical Exam Constitutional:      General: She is not in acute distress.    Appearance: Normal appearance.  HENT:     Head: Normocephalic and atraumatic.     Nose: Nose normal.     Mouth/Throat:     Mouth: Mucous membranes are moist.  Eyes:     Conjunctiva/sclera: Conjunctivae normal.  Cardiovascular:     Rate and Rhythm: Normal rate and regular rhythm.  Pulmonary:     Effort: Pulmonary effort is normal.      Breath sounds: Normal breath sounds.  Abdominal:     General: Bowel sounds are normal.     Palpations: Abdomen is soft.     Comments: Has colostomy  Musculoskeletal:        General: Normal range of motion.     Cervical back: Normal range of motion.  Skin:    General: Skin is warm and dry.  Neurological:     General: No focal deficit present.     Mental Status: She is alert and oriented to person, place, and time.  Psychiatric:        Mood and Affect: Mood normal.        Behavior: Behavior normal.        Thought Content: Thought content normal.        Judgment: Judgment normal.       Labs reviewed: Recent Labs    11/22/21 0000  NA 143  K 3.6  CL 104  CO2 26*  BUN 19  CREATININE 0.6  CALCIUM 9.4   Recent Labs    11/22/21 0000  AST 15  ALT 12  ALKPHOS 65  ALBUMIN 4.0   Recent Labs    11/22/21 0000  WBC 5.4  NEUTROABS 2,900.00  HGB 14.4  HCT 44  PLT 119*   Lab Results  Component Value Date   TSH 1.40 12/21/2020   Lab Results  Component Value Date   HGBA1C 5.9 (H) 09/26/2015   Lab Results  Component Value Date   CHOL 136 12/21/2020   HDL 93 (A) 12/21/2020   LDLCALC 72 12/21/2020   LDLDIRECT 147.0 11/17/2012   TRIG 119 12/21/2020   CHOLHDL 3 02/10/2020    Significant Diagnostic Results in last 30 days:  No results found.  Assessment/Plan  1. Essential hypertension -   BPs elevated -  will increase amlodipine from 2.5 mg daily to 5 mg daily -   Monitor BPs  2. History of CVA (cerebrovascular accident) -    No residual deficit -   Continue aspirin and rosuvastatin  3. Anemia, unspecified type Lab Results  Component Value Date   HGB 14.4 11/22/2021   -    Discontinue iron and recheck CBC in 1 month  4. Vitamin B12 deficiency Lab Results  Component Value Date   VITAMINB12 942 12/21/2020   -   Continue vitamin B12  5. Depression, recurrent (HCC) -   Mood is stable, continue venlafaxine  6. Colostomy status ([place]) -   colostomy  care done daily    Family/ staff Communication: Discussed plan of care with resident and charge nurse.  Labs/tests ordered:   CBC in 1 month    Durenda Age, DNP, MSN, FNP-BC [place] [phone] (Monday-Friday 8:00 a.m. - 5:00 p.m.) [phone] (after hours)

## 2022-03-13 DIAGNOSIS — M6281 Muscle weakness (generalized): Secondary | ICD-10-CM | POA: Diagnosis not present

## 2022-03-13 DIAGNOSIS — I69328 Other speech and language deficits following cerebral infarction: Secondary | ICD-10-CM | POA: Diagnosis not present

## 2022-03-13 DIAGNOSIS — R499 Unspecified voice and resonance disorder: Secondary | ICD-10-CM | POA: Diagnosis not present

## 2022-03-13 DIAGNOSIS — I639 Cerebral infarction, unspecified: Secondary | ICD-10-CM | POA: Diagnosis not present

## 2022-03-13 DIAGNOSIS — R278 Other lack of coordination: Secondary | ICD-10-CM | POA: Diagnosis not present

## 2022-03-14 DIAGNOSIS — R499 Unspecified voice and resonance disorder: Secondary | ICD-10-CM | POA: Diagnosis not present

## 2022-03-14 DIAGNOSIS — I69328 Other speech and language deficits following cerebral infarction: Secondary | ICD-10-CM | POA: Diagnosis not present

## 2022-03-14 DIAGNOSIS — R278 Other lack of coordination: Secondary | ICD-10-CM | POA: Diagnosis not present

## 2022-03-14 DIAGNOSIS — M6281 Muscle weakness (generalized): Secondary | ICD-10-CM | POA: Diagnosis not present

## 2022-03-14 DIAGNOSIS — I639 Cerebral infarction, unspecified: Secondary | ICD-10-CM | POA: Diagnosis not present

## 2022-03-16 DIAGNOSIS — M6281 Muscle weakness (generalized): Secondary | ICD-10-CM | POA: Diagnosis not present

## 2022-03-16 DIAGNOSIS — I69328 Other speech and language deficits following cerebral infarction: Secondary | ICD-10-CM | POA: Diagnosis not present

## 2022-03-16 DIAGNOSIS — R278 Other lack of coordination: Secondary | ICD-10-CM | POA: Diagnosis not present

## 2022-03-16 DIAGNOSIS — R499 Unspecified voice and resonance disorder: Secondary | ICD-10-CM | POA: Diagnosis not present

## 2022-03-16 DIAGNOSIS — I639 Cerebral infarction, unspecified: Secondary | ICD-10-CM | POA: Diagnosis not present

## 2022-03-19 DIAGNOSIS — R278 Other lack of coordination: Secondary | ICD-10-CM | POA: Diagnosis not present

## 2022-03-19 DIAGNOSIS — I69328 Other speech and language deficits following cerebral infarction: Secondary | ICD-10-CM | POA: Diagnosis not present

## 2022-03-19 DIAGNOSIS — M6281 Muscle weakness (generalized): Secondary | ICD-10-CM | POA: Diagnosis not present

## 2022-03-19 DIAGNOSIS — R499 Unspecified voice and resonance disorder: Secondary | ICD-10-CM | POA: Diagnosis not present

## 2022-03-19 DIAGNOSIS — I639 Cerebral infarction, unspecified: Secondary | ICD-10-CM | POA: Diagnosis not present

## 2022-03-20 DIAGNOSIS — R770 Abnormality of albumin: Secondary | ICD-10-CM | POA: Diagnosis not present

## 2022-03-20 LAB — BASIC METABOLIC PANEL
BUN: 18 (ref 4–21)
CO2: 28 — AB (ref 13–22)
Chloride: 104 (ref 99–108)
Creatinine: 0.7 (ref 0.5–1.1)
Glucose: 104
Potassium: 3.7 mEq/L (ref 3.5–5.1)
Sodium: 142 (ref 137–147)

## 2022-03-20 LAB — HEPATIC FUNCTION PANEL
ALT: 14 U/L (ref 7–35)
AST: 15 (ref 13–35)
Alkaline Phosphatase: 63 (ref 25–125)
Bilirubin, Total: 0.6

## 2022-03-20 LAB — COMPREHENSIVE METABOLIC PANEL
Albumin: 4.6 (ref 3.5–5.0)
Calcium: 9.5 (ref 8.7–10.7)
Globulin: 2.7
eGFR: 85

## 2022-03-21 DIAGNOSIS — R278 Other lack of coordination: Secondary | ICD-10-CM | POA: Diagnosis not present

## 2022-03-21 DIAGNOSIS — M6281 Muscle weakness (generalized): Secondary | ICD-10-CM | POA: Diagnosis not present

## 2022-03-21 DIAGNOSIS — I639 Cerebral infarction, unspecified: Secondary | ICD-10-CM | POA: Diagnosis not present

## 2022-03-21 DIAGNOSIS — I69328 Other speech and language deficits following cerebral infarction: Secondary | ICD-10-CM | POA: Diagnosis not present

## 2022-03-21 DIAGNOSIS — R499 Unspecified voice and resonance disorder: Secondary | ICD-10-CM | POA: Diagnosis not present

## 2022-03-22 DIAGNOSIS — I69328 Other speech and language deficits following cerebral infarction: Secondary | ICD-10-CM | POA: Diagnosis not present

## 2022-03-22 DIAGNOSIS — R278 Other lack of coordination: Secondary | ICD-10-CM | POA: Diagnosis not present

## 2022-03-22 DIAGNOSIS — M6281 Muscle weakness (generalized): Secondary | ICD-10-CM | POA: Diagnosis not present

## 2022-03-22 DIAGNOSIS — R499 Unspecified voice and resonance disorder: Secondary | ICD-10-CM | POA: Diagnosis not present

## 2022-03-22 DIAGNOSIS — I639 Cerebral infarction, unspecified: Secondary | ICD-10-CM | POA: Diagnosis not present

## 2022-03-26 ENCOUNTER — Non-Acute Institutional Stay: Payer: Medicare Other | Admitting: Orthopedic Surgery

## 2022-03-26 ENCOUNTER — Encounter: Payer: Self-pay | Admitting: Orthopedic Surgery

## 2022-03-26 DIAGNOSIS — R278 Other lack of coordination: Secondary | ICD-10-CM | POA: Diagnosis not present

## 2022-03-26 DIAGNOSIS — I639 Cerebral infarction, unspecified: Secondary | ICD-10-CM | POA: Diagnosis not present

## 2022-03-26 DIAGNOSIS — Z933 Colostomy status: Secondary | ICD-10-CM | POA: Diagnosis not present

## 2022-03-26 DIAGNOSIS — M6281 Muscle weakness (generalized): Secondary | ICD-10-CM | POA: Diagnosis not present

## 2022-03-26 DIAGNOSIS — I69328 Other speech and language deficits following cerebral infarction: Secondary | ICD-10-CM | POA: Diagnosis not present

## 2022-03-26 DIAGNOSIS — R499 Unspecified voice and resonance disorder: Secondary | ICD-10-CM | POA: Diagnosis not present

## 2022-03-26 DIAGNOSIS — K611 Rectal abscess: Secondary | ICD-10-CM

## 2022-03-26 NOTE — Progress Notes (Signed)
Location:  [place] Room Number: AL913/A Place of Service:  ALF [phone]) Provider: Yvonna Alanis, NP  Patient Care Team: Virgie Dad, MD as PCP - General (Internal Medicine) Viona Gilmore, [facility] as Pharmacist (Pharmacist)  Extended Emergency Contact Information Primary Emergency Contact: [place] [employer] Phone: [phone] Relation: Daughter  Code Status:  Full code  Goals of care: Advanced Directive information    03/26/2022    1:58 PM  Advanced Directives  Does Patient Have a Medical Advance Directive? Yes  Type of Paramedic of [place];Living will  Does patient want to make changes to medical advance directive? No - Patient declined  Copy of [place] in Chart? Yes - validated most recent copy scanned in chart (See row information)     Chief Complaint  Patient presents with   Acute Visit    Abscess     HPI:  Pt is a 88 y.o. female seen today for an acute visit for possible abscess.   She noticed a small tender pimple near peri area 07/16. Denies fever. She has had similar issue to groin in past. She has been applying warm compresses to area and believes size has already reduced. Continues to perform self care for colostomy without issue. Admits to frequent urination at times and uses briefs. Changes briefs often. Treatment options discussed. She would like to try antibiotics before I/D due to location.    Past Medical History:  Diagnosis Date   Arthritis    Depression    Hyperlipidemia    Osteopenia    Stroke ([place])    Thrombocytopenia (HCC)    Vitamin D deficiency    Wrist fracture 2002   left   Past Surgical History:  Procedure Laterality Date   APPENDECTOMY  1967   CARPAL TUNNEL RELEASE  2008   right   COLON RESECTION SIGMOID N/A 12/03/2020   Procedure: EXPLORATORY LAPAROTOMY; HARTMAN'S PROCEDURE; SPLEENIC FLEXURE MOBILIZATION;  Surgeon: Leighton Ruff, MD;   Location: [facility];  Service: General;  Laterality: N/A;   COLOSTOMY N/A 12/03/2020   Procedure: COLOSTOMY;  Surgeon: Leighton Ruff, MD;  Location: [facility];  Service: General;  Laterality: N/A;   JOINT REPLACEMENT  2002   right total     Allergies  Allergen Reactions   Bactrim [Sulfamethoxazole-Trimethoprim] Nausea Only   Ditropan [Oxybutynin]     Made patient feel bad   Sulfa Antibiotics     Other reaction(s): stomach upset    Outpatient Encounter Medications as of 03/26/2022  Medication Sig   acetaminophen (TYLENOL) 325 MG tablet Take 2 tablets (650 mg total) by mouth every 6 (six) hours as needed for mild pain or moderate pain.   acetaminophen (TYLENOL) 500 MG tablet Take 1,000 mg by mouth 2 (two) times daily.   alum & mag hydroxide-simeth (MAALOX/MYLANTA) 200-200-20 MG/5ML suspension Take 30 mLs by mouth every 4 (four) hours as needed for indigestion or heartburn.   amLODipine (NORVASC) 2.5 MG tablet Take 2.5 mg by mouth daily.   aspirin EC 325 MG EC tablet Take 1 tablet (325 mg total) by mouth daily.   Biotin 10 MG TABS Take 10 mg by mouth daily.    calcium carbonate (OSCAL) 1500 (600 Ca) MG TABS tablet Take 600 mg of elemental calcium by mouth daily.   Charcoal Activated 260 MG CAPS Take 1 capsule by mouth every 6 (six) hours as needed (For gas).   CHELATED MAGNESIUM PO Take 300  mg by mouth daily.   Cholecalciferol (VITAMIN D3) 2000 units capsule Take 2,000 Units by mouth daily.   diclofenac Sodium (VOLTAREN ARTHRITIS PAIN) 1 % GEL Apply 2 g topically 3 (three) times daily as needed (Left wrist).   doxycycline (VIBRAMYCIN) 100 MG capsule Take 100 mg by mouth 2 (two) times daily.   fluticasone (FLONASE) 50 MCG/ACT nasal spray Place 1 spray into both nostrils 2 (two) times daily.   latanoprost (XALATAN) 0.005 % ophthalmic solution Place 1 drop into the left eye at bedtime.   loperamide (IMODIUM A-D) 2 MG tablet Take 2 mg by mouth as needed for diarrhea or loose stools.   magnesium  hydroxide (MILK OF MAGNESIA) 400 MG/5ML suspension Take 30 mLs by mouth daily as needed for mild constipation.   minoxidil (ROGAINE) 2 % external solution Apply topically daily as needed.   polyethylene glycol (MIRALAX / GLYCOLAX) 17 g packet Take 17 g by mouth daily.   potassium chloride SA (KLOR-CON) 20 MEQ tablet Take 20 mEq by mouth daily.   rosuvastatin (CRESTOR) 10 MG tablet TAKE 1 TABLET ONCE DAILY.   simethicone (MYLICON) 80 MG chewable tablet Chew 80 mg by mouth 4 (four) times daily as needed.   venlafaxine XR (EFFEXOR-XR) 75 MG 24 hr capsule TAKE 2 CAPSULES ('150MG'$ ) BY MOUTH DAILY.   vitamin B-12 (CYANOCOBALAMIN) 1000 MCG tablet Take 1,000 mcg by mouth daily.   Vitamin E 45 MG (100 UNIT) CAPS Take 1 capsule by mouth daily.   [DISCONTINUED] Ferrous Sulfate (IRON PO) Take 325 mg by mouth daily.   No facility-administered encounter medications on file as of 03/26/2022.    Review of Systems  Constitutional:  Negative for activity change, appetite change, chills and fatigue.  Respiratory:  Negative for cough, shortness of breath and wheezing.   Cardiovascular:  Negative for chest pain and leg swelling.  Gastrointestinal:  Negative for abdominal pain, constipation, diarrhea and nausea.       Colostomy  Genitourinary:  Negative for genital sores, vaginal bleeding, vaginal discharge and vaginal pain.       Perianal growth  Psychiatric/Behavioral:  Negative for confusion and dysphoric mood. The patient is not nervous/anxious.     Immunization History  Administered Date(s) Administered   Fluad Quad(high Dose 65+) 06/13/2019   Influenza Split 06/18/2012   Influenza Whole 07/11/2010   Influenza, High Dose Seasonal PF 07/07/2013, 07/14/2014, 06/24/2015, 06/13/2016, 06/04/2017, 06/17/2018   Influenza-Unspecified 06/22/2020, 06/29/2021   Moderna Covid-19 Vaccine Bivalent Booster 28yr & up 01/26/2022   Moderna SARS-COV2 Booster Vaccination 07/19/2020, 02/07/2021   Moderna Sars-Covid-2  Vaccination 09/14/2019, 10/12/2019   PFIZER(Purple Top)SARS-COV-2 Vaccination 05/30/2021   Pneumococcal Conjugate-13 04/27/2014   Pneumococcal Polysaccharide-23 10/15/2017   Td 09/10/2005   Zoster Recombinat (Shingrix) 02/28/2022   Zoster, Live 11/29/2006   Pertinent  Health Maintenance Due  Topic Date Due   INFLUENZA VACCINE  04/10/2022   DEXA SCAN  Completed      12/13/2020    3:00 PM 12/14/2020    5:00 AM 12/14/2020    8:00 AM 12/14/2020    8:29 PM 12/15/2020    8:44 AM  Fall Risk  Patient Fall Risk Level Moderate fall risk Moderate fall risk Moderate fall risk Moderate fall risk High fall risk   Functional Status Survey:    Vitals:   03/26/22 1352  BP: 131/75  Pulse: 85  Resp: 16  Temp: (!) 96.7 F (35.9 C)  SpO2: 94%  Weight: 163 lb 3.2 oz (74 kg)  Height: '5\' 3"'$  (  1.6 m)   Body mass index is 28.91 kg/m. Physical Exam Vitals reviewed.  HENT:     Head: Normocephalic.  Eyes:     General:        Right eye: No discharge.        Left eye: No discharge.  Cardiovascular:     Rate and Rhythm: Normal rate and regular rhythm.     Pulses: Normal pulses.     Heart sounds: Normal heart sounds.  Pulmonary:     Effort: Pulmonary effort is normal. No respiratory distress.     Breath sounds: Normal breath sounds. No wheezing.  Abdominal:     General: Bowel sounds are normal. There is no distension.     Palpations: Abdomen is soft.     Tenderness: There is no abdominal tenderness. There is no guarding.     Comments: Colostomy present, stoma beefy red, normal output  Musculoskeletal:     Cervical back: Neck supple.  Skin:    General: Skin is warm and dry.     Capillary Refill: Capillary refill takes less than 2 seconds.     Findings: Lesion present.     Comments: Approx 1 cm, raised, tender lesion to right perianal region, no drainage, surrounding skin intact  Neurological:     General: No focal deficit present.     Mental Status: She is alert and oriented to person, place,  and time.     Motor: Weakness present.     Gait: Gait abnormal.     Comments: walker  Psychiatric:        Mood and Affect: Mood normal.        Behavior: Behavior normal.     Labs reviewed: Recent Labs    11/22/21 0000 03/20/22 0000  NA 143 142  K 3.6 3.7  CL 104 104  CO2 26* 28*  BUN 19 18  CREATININE 0.6 0.7  CALCIUM 9.4 9.5   Recent Labs    11/22/21 0000 03/20/22 0000  AST 15 15  ALT 12 14  ALKPHOS 65 63  ALBUMIN 4.0 4.6   Recent Labs    11/22/21 0000  WBC 5.4  NEUTROABS 2,900.00  HGB 14.4  HCT 44  PLT 119*   Lab Results  Component Value Date   TSH 1.40 12/21/2020   Lab Results  Component Value Date   HGBA1C 5.9 (H) 09/26/2015   Lab Results  Component Value Date   CHOL 136 12/21/2020   HDL 93 (A) 12/21/2020   LDLCALC 72 12/21/2020   LDLDIRECT 147.0 11/17/2012   TRIG 119 12/21/2020   CHOLHDL 3 02/10/2020    Significant Diagnostic Results in last 30 days:  No results found.  Assessment/Plan 1. Abscess, perirectal - Approx 1 cm, raised, tender lesion to right perianal region, no drainage, surrounding skin intact - ingrown hair vs boil - no I/D due to size and area - start doxycycline 100 mg po bid x 10 days - cont warm compress  - advised to notify nurse/PCP if symptoms worsen  2. Colostomy status ([place]) - d/t diverticulitis to distal sigmoid colon - s/p Hartman's procedure - colostomy with normal output - cont self care    Family/ staff Communication: plan discussed with patient and nurse  Labs/tests ordered:  none

## 2022-03-27 DIAGNOSIS — R278 Other lack of coordination: Secondary | ICD-10-CM | POA: Diagnosis not present

## 2022-03-27 DIAGNOSIS — I69328 Other speech and language deficits following cerebral infarction: Secondary | ICD-10-CM | POA: Diagnosis not present

## 2022-03-27 DIAGNOSIS — M6281 Muscle weakness (generalized): Secondary | ICD-10-CM | POA: Diagnosis not present

## 2022-03-27 DIAGNOSIS — I639 Cerebral infarction, unspecified: Secondary | ICD-10-CM | POA: Diagnosis not present

## 2022-03-27 DIAGNOSIS — R499 Unspecified voice and resonance disorder: Secondary | ICD-10-CM | POA: Diagnosis not present

## 2022-03-28 DIAGNOSIS — I69328 Other speech and language deficits following cerebral infarction: Secondary | ICD-10-CM | POA: Diagnosis not present

## 2022-03-28 DIAGNOSIS — R499 Unspecified voice and resonance disorder: Secondary | ICD-10-CM | POA: Diagnosis not present

## 2022-03-28 DIAGNOSIS — M6281 Muscle weakness (generalized): Secondary | ICD-10-CM | POA: Diagnosis not present

## 2022-03-28 DIAGNOSIS — I639 Cerebral infarction, unspecified: Secondary | ICD-10-CM | POA: Diagnosis not present

## 2022-03-28 DIAGNOSIS — R278 Other lack of coordination: Secondary | ICD-10-CM | POA: Diagnosis not present

## 2022-03-29 DIAGNOSIS — I639 Cerebral infarction, unspecified: Secondary | ICD-10-CM | POA: Diagnosis not present

## 2022-03-29 DIAGNOSIS — R499 Unspecified voice and resonance disorder: Secondary | ICD-10-CM | POA: Diagnosis not present

## 2022-03-29 DIAGNOSIS — M6281 Muscle weakness (generalized): Secondary | ICD-10-CM | POA: Diagnosis not present

## 2022-03-29 DIAGNOSIS — R278 Other lack of coordination: Secondary | ICD-10-CM | POA: Diagnosis not present

## 2022-03-29 DIAGNOSIS — I69328 Other speech and language deficits following cerebral infarction: Secondary | ICD-10-CM | POA: Diagnosis not present

## 2022-04-02 DIAGNOSIS — R278 Other lack of coordination: Secondary | ICD-10-CM | POA: Diagnosis not present

## 2022-04-02 DIAGNOSIS — M6281 Muscle weakness (generalized): Secondary | ICD-10-CM | POA: Diagnosis not present

## 2022-04-02 DIAGNOSIS — R499 Unspecified voice and resonance disorder: Secondary | ICD-10-CM | POA: Diagnosis not present

## 2022-04-02 DIAGNOSIS — I69328 Other speech and language deficits following cerebral infarction: Secondary | ICD-10-CM | POA: Diagnosis not present

## 2022-04-02 DIAGNOSIS — F330 Major depressive disorder, recurrent, mild: Secondary | ICD-10-CM | POA: Diagnosis not present

## 2022-04-02 DIAGNOSIS — I639 Cerebral infarction, unspecified: Secondary | ICD-10-CM | POA: Diagnosis not present

## 2022-04-04 DIAGNOSIS — I69328 Other speech and language deficits following cerebral infarction: Secondary | ICD-10-CM | POA: Diagnosis not present

## 2022-04-04 DIAGNOSIS — I639 Cerebral infarction, unspecified: Secondary | ICD-10-CM | POA: Diagnosis not present

## 2022-04-04 DIAGNOSIS — M6281 Muscle weakness (generalized): Secondary | ICD-10-CM | POA: Diagnosis not present

## 2022-04-04 DIAGNOSIS — R278 Other lack of coordination: Secondary | ICD-10-CM | POA: Diagnosis not present

## 2022-04-04 DIAGNOSIS — R499 Unspecified voice and resonance disorder: Secondary | ICD-10-CM | POA: Diagnosis not present

## 2022-04-05 DIAGNOSIS — R278 Other lack of coordination: Secondary | ICD-10-CM | POA: Diagnosis not present

## 2022-04-05 DIAGNOSIS — I639 Cerebral infarction, unspecified: Secondary | ICD-10-CM | POA: Diagnosis not present

## 2022-04-05 DIAGNOSIS — I69328 Other speech and language deficits following cerebral infarction: Secondary | ICD-10-CM | POA: Diagnosis not present

## 2022-04-05 DIAGNOSIS — M6281 Muscle weakness (generalized): Secondary | ICD-10-CM | POA: Diagnosis not present

## 2022-04-05 DIAGNOSIS — R499 Unspecified voice and resonance disorder: Secondary | ICD-10-CM | POA: Diagnosis not present

## 2022-04-09 DIAGNOSIS — R499 Unspecified voice and resonance disorder: Secondary | ICD-10-CM | POA: Diagnosis not present

## 2022-04-09 DIAGNOSIS — M6281 Muscle weakness (generalized): Secondary | ICD-10-CM | POA: Diagnosis not present

## 2022-04-09 DIAGNOSIS — I69328 Other speech and language deficits following cerebral infarction: Secondary | ICD-10-CM | POA: Diagnosis not present

## 2022-04-09 DIAGNOSIS — I639 Cerebral infarction, unspecified: Secondary | ICD-10-CM | POA: Diagnosis not present

## 2022-04-09 DIAGNOSIS — R278 Other lack of coordination: Secondary | ICD-10-CM | POA: Diagnosis not present

## 2022-04-12 DIAGNOSIS — M6281 Muscle weakness (generalized): Secondary | ICD-10-CM | POA: Diagnosis not present

## 2022-04-12 DIAGNOSIS — I639 Cerebral infarction, unspecified: Secondary | ICD-10-CM | POA: Diagnosis not present

## 2022-04-12 DIAGNOSIS — R278 Other lack of coordination: Secondary | ICD-10-CM | POA: Diagnosis not present

## 2022-04-12 DIAGNOSIS — R499 Unspecified voice and resonance disorder: Secondary | ICD-10-CM | POA: Diagnosis not present

## 2022-04-12 DIAGNOSIS — R4189 Other symptoms and signs involving cognitive functions and awareness: Secondary | ICD-10-CM | POA: Diagnosis not present

## 2022-04-12 DIAGNOSIS — I69328 Other speech and language deficits following cerebral infarction: Secondary | ICD-10-CM | POA: Diagnosis not present

## 2022-04-13 DIAGNOSIS — I639 Cerebral infarction, unspecified: Secondary | ICD-10-CM | POA: Diagnosis not present

## 2022-04-13 DIAGNOSIS — H401421 Capsular glaucoma with pseudoexfoliation of lens, left eye, mild stage: Secondary | ICD-10-CM | POA: Diagnosis not present

## 2022-04-13 DIAGNOSIS — Z961 Presence of intraocular lens: Secondary | ICD-10-CM | POA: Diagnosis not present

## 2022-04-13 DIAGNOSIS — M6281 Muscle weakness (generalized): Secondary | ICD-10-CM | POA: Diagnosis not present

## 2022-04-13 DIAGNOSIS — R499 Unspecified voice and resonance disorder: Secondary | ICD-10-CM | POA: Diagnosis not present

## 2022-04-13 DIAGNOSIS — R4189 Other symptoms and signs involving cognitive functions and awareness: Secondary | ICD-10-CM | POA: Diagnosis not present

## 2022-04-13 DIAGNOSIS — R278 Other lack of coordination: Secondary | ICD-10-CM | POA: Diagnosis not present

## 2022-04-13 DIAGNOSIS — I69328 Other speech and language deficits following cerebral infarction: Secondary | ICD-10-CM | POA: Diagnosis not present

## 2022-04-16 DIAGNOSIS — R278 Other lack of coordination: Secondary | ICD-10-CM | POA: Diagnosis not present

## 2022-04-16 DIAGNOSIS — R499 Unspecified voice and resonance disorder: Secondary | ICD-10-CM | POA: Diagnosis not present

## 2022-04-16 DIAGNOSIS — I69328 Other speech and language deficits following cerebral infarction: Secondary | ICD-10-CM | POA: Diagnosis not present

## 2022-04-16 DIAGNOSIS — R4189 Other symptoms and signs involving cognitive functions and awareness: Secondary | ICD-10-CM | POA: Diagnosis not present

## 2022-04-16 DIAGNOSIS — I639 Cerebral infarction, unspecified: Secondary | ICD-10-CM | POA: Diagnosis not present

## 2022-04-16 DIAGNOSIS — M6281 Muscle weakness (generalized): Secondary | ICD-10-CM | POA: Diagnosis not present

## 2022-04-17 DIAGNOSIS — R278 Other lack of coordination: Secondary | ICD-10-CM | POA: Diagnosis not present

## 2022-04-17 DIAGNOSIS — M6281 Muscle weakness (generalized): Secondary | ICD-10-CM | POA: Diagnosis not present

## 2022-04-17 DIAGNOSIS — R4189 Other symptoms and signs involving cognitive functions and awareness: Secondary | ICD-10-CM | POA: Diagnosis not present

## 2022-04-17 DIAGNOSIS — I69328 Other speech and language deficits following cerebral infarction: Secondary | ICD-10-CM | POA: Diagnosis not present

## 2022-04-17 DIAGNOSIS — R499 Unspecified voice and resonance disorder: Secondary | ICD-10-CM | POA: Diagnosis not present

## 2022-04-17 DIAGNOSIS — I10 Essential (primary) hypertension: Secondary | ICD-10-CM | POA: Diagnosis not present

## 2022-04-17 DIAGNOSIS — I639 Cerebral infarction, unspecified: Secondary | ICD-10-CM | POA: Diagnosis not present

## 2022-04-17 LAB — CBC AND DIFFERENTIAL
HCT: 39 (ref 36–46)
Hemoglobin: 13.1 (ref 12.0–16.0)
Neutrophils Absolute: 2167
Platelets: 108 10*3/uL — AB (ref 150–400)

## 2022-04-17 LAB — CBC: RBC: 4.17 (ref 3.87–5.11)

## 2022-04-19 DIAGNOSIS — I639 Cerebral infarction, unspecified: Secondary | ICD-10-CM | POA: Diagnosis not present

## 2022-04-19 DIAGNOSIS — I69328 Other speech and language deficits following cerebral infarction: Secondary | ICD-10-CM | POA: Diagnosis not present

## 2022-04-19 DIAGNOSIS — M6281 Muscle weakness (generalized): Secondary | ICD-10-CM | POA: Diagnosis not present

## 2022-04-19 DIAGNOSIS — R278 Other lack of coordination: Secondary | ICD-10-CM | POA: Diagnosis not present

## 2022-04-19 DIAGNOSIS — R499 Unspecified voice and resonance disorder: Secondary | ICD-10-CM | POA: Diagnosis not present

## 2022-04-19 DIAGNOSIS — R4189 Other symptoms and signs involving cognitive functions and awareness: Secondary | ICD-10-CM | POA: Diagnosis not present

## 2022-04-20 DIAGNOSIS — M6281 Muscle weakness (generalized): Secondary | ICD-10-CM | POA: Diagnosis not present

## 2022-04-20 DIAGNOSIS — R278 Other lack of coordination: Secondary | ICD-10-CM | POA: Diagnosis not present

## 2022-04-20 DIAGNOSIS — I69328 Other speech and language deficits following cerebral infarction: Secondary | ICD-10-CM | POA: Diagnosis not present

## 2022-04-20 DIAGNOSIS — R499 Unspecified voice and resonance disorder: Secondary | ICD-10-CM | POA: Diagnosis not present

## 2022-04-20 DIAGNOSIS — I639 Cerebral infarction, unspecified: Secondary | ICD-10-CM | POA: Diagnosis not present

## 2022-04-20 DIAGNOSIS — R4189 Other symptoms and signs involving cognitive functions and awareness: Secondary | ICD-10-CM | POA: Diagnosis not present

## 2022-04-23 DIAGNOSIS — R499 Unspecified voice and resonance disorder: Secondary | ICD-10-CM | POA: Diagnosis not present

## 2022-04-23 DIAGNOSIS — M6281 Muscle weakness (generalized): Secondary | ICD-10-CM | POA: Diagnosis not present

## 2022-04-23 DIAGNOSIS — R278 Other lack of coordination: Secondary | ICD-10-CM | POA: Diagnosis not present

## 2022-04-23 DIAGNOSIS — R4189 Other symptoms and signs involving cognitive functions and awareness: Secondary | ICD-10-CM | POA: Diagnosis not present

## 2022-04-23 DIAGNOSIS — I639 Cerebral infarction, unspecified: Secondary | ICD-10-CM | POA: Diagnosis not present

## 2022-04-23 DIAGNOSIS — I69328 Other speech and language deficits following cerebral infarction: Secondary | ICD-10-CM | POA: Diagnosis not present

## 2022-04-24 DIAGNOSIS — R4189 Other symptoms and signs involving cognitive functions and awareness: Secondary | ICD-10-CM | POA: Diagnosis not present

## 2022-04-24 DIAGNOSIS — R499 Unspecified voice and resonance disorder: Secondary | ICD-10-CM | POA: Diagnosis not present

## 2022-04-24 DIAGNOSIS — M6281 Muscle weakness (generalized): Secondary | ICD-10-CM | POA: Diagnosis not present

## 2022-04-24 DIAGNOSIS — R278 Other lack of coordination: Secondary | ICD-10-CM | POA: Diagnosis not present

## 2022-04-24 DIAGNOSIS — I639 Cerebral infarction, unspecified: Secondary | ICD-10-CM | POA: Diagnosis not present

## 2022-04-24 DIAGNOSIS — I69328 Other speech and language deficits following cerebral infarction: Secondary | ICD-10-CM | POA: Diagnosis not present

## 2022-04-25 DIAGNOSIS — M6281 Muscle weakness (generalized): Secondary | ICD-10-CM | POA: Diagnosis not present

## 2022-04-25 DIAGNOSIS — R278 Other lack of coordination: Secondary | ICD-10-CM | POA: Diagnosis not present

## 2022-04-25 DIAGNOSIS — I639 Cerebral infarction, unspecified: Secondary | ICD-10-CM | POA: Diagnosis not present

## 2022-04-25 DIAGNOSIS — R4189 Other symptoms and signs involving cognitive functions and awareness: Secondary | ICD-10-CM | POA: Diagnosis not present

## 2022-04-25 DIAGNOSIS — I69328 Other speech and language deficits following cerebral infarction: Secondary | ICD-10-CM | POA: Diagnosis not present

## 2022-04-25 DIAGNOSIS — R499 Unspecified voice and resonance disorder: Secondary | ICD-10-CM | POA: Diagnosis not present

## 2022-04-26 DIAGNOSIS — R499 Unspecified voice and resonance disorder: Secondary | ICD-10-CM | POA: Diagnosis not present

## 2022-04-26 DIAGNOSIS — I69328 Other speech and language deficits following cerebral infarction: Secondary | ICD-10-CM | POA: Diagnosis not present

## 2022-04-26 DIAGNOSIS — R278 Other lack of coordination: Secondary | ICD-10-CM | POA: Diagnosis not present

## 2022-04-26 DIAGNOSIS — M6281 Muscle weakness (generalized): Secondary | ICD-10-CM | POA: Diagnosis not present

## 2022-04-26 DIAGNOSIS — I639 Cerebral infarction, unspecified: Secondary | ICD-10-CM | POA: Diagnosis not present

## 2022-04-26 DIAGNOSIS — R4189 Other symptoms and signs involving cognitive functions and awareness: Secondary | ICD-10-CM | POA: Diagnosis not present

## 2022-04-30 DIAGNOSIS — I639 Cerebral infarction, unspecified: Secondary | ICD-10-CM | POA: Diagnosis not present

## 2022-04-30 DIAGNOSIS — R499 Unspecified voice and resonance disorder: Secondary | ICD-10-CM | POA: Diagnosis not present

## 2022-04-30 DIAGNOSIS — R278 Other lack of coordination: Secondary | ICD-10-CM | POA: Diagnosis not present

## 2022-04-30 DIAGNOSIS — M6281 Muscle weakness (generalized): Secondary | ICD-10-CM | POA: Diagnosis not present

## 2022-04-30 DIAGNOSIS — R4189 Other symptoms and signs involving cognitive functions and awareness: Secondary | ICD-10-CM | POA: Diagnosis not present

## 2022-04-30 DIAGNOSIS — I69328 Other speech and language deficits following cerebral infarction: Secondary | ICD-10-CM | POA: Diagnosis not present

## 2022-05-01 DIAGNOSIS — I69328 Other speech and language deficits following cerebral infarction: Secondary | ICD-10-CM | POA: Diagnosis not present

## 2022-05-01 DIAGNOSIS — I639 Cerebral infarction, unspecified: Secondary | ICD-10-CM | POA: Diagnosis not present

## 2022-05-01 DIAGNOSIS — R4189 Other symptoms and signs involving cognitive functions and awareness: Secondary | ICD-10-CM | POA: Diagnosis not present

## 2022-05-01 DIAGNOSIS — R499 Unspecified voice and resonance disorder: Secondary | ICD-10-CM | POA: Diagnosis not present

## 2022-05-01 DIAGNOSIS — R278 Other lack of coordination: Secondary | ICD-10-CM | POA: Diagnosis not present

## 2022-05-01 DIAGNOSIS — M6281 Muscle weakness (generalized): Secondary | ICD-10-CM | POA: Diagnosis not present

## 2022-05-03 DIAGNOSIS — R499 Unspecified voice and resonance disorder: Secondary | ICD-10-CM | POA: Diagnosis not present

## 2022-05-03 DIAGNOSIS — R4189 Other symptoms and signs involving cognitive functions and awareness: Secondary | ICD-10-CM | POA: Diagnosis not present

## 2022-05-03 DIAGNOSIS — I69328 Other speech and language deficits following cerebral infarction: Secondary | ICD-10-CM | POA: Diagnosis not present

## 2022-05-03 DIAGNOSIS — R278 Other lack of coordination: Secondary | ICD-10-CM | POA: Diagnosis not present

## 2022-05-03 DIAGNOSIS — I639 Cerebral infarction, unspecified: Secondary | ICD-10-CM | POA: Diagnosis not present

## 2022-05-03 DIAGNOSIS — M6281 Muscle weakness (generalized): Secondary | ICD-10-CM | POA: Diagnosis not present

## 2022-05-04 DIAGNOSIS — I639 Cerebral infarction, unspecified: Secondary | ICD-10-CM | POA: Diagnosis not present

## 2022-05-04 DIAGNOSIS — R278 Other lack of coordination: Secondary | ICD-10-CM | POA: Diagnosis not present

## 2022-05-04 DIAGNOSIS — R499 Unspecified voice and resonance disorder: Secondary | ICD-10-CM | POA: Diagnosis not present

## 2022-05-04 DIAGNOSIS — M6281 Muscle weakness (generalized): Secondary | ICD-10-CM | POA: Diagnosis not present

## 2022-05-04 DIAGNOSIS — R4189 Other symptoms and signs involving cognitive functions and awareness: Secondary | ICD-10-CM | POA: Diagnosis not present

## 2022-05-04 DIAGNOSIS — I69328 Other speech and language deficits following cerebral infarction: Secondary | ICD-10-CM | POA: Diagnosis not present

## 2022-05-07 DIAGNOSIS — F330 Major depressive disorder, recurrent, mild: Secondary | ICD-10-CM | POA: Diagnosis not present

## 2022-05-07 DIAGNOSIS — M6281 Muscle weakness (generalized): Secondary | ICD-10-CM | POA: Diagnosis not present

## 2022-05-07 DIAGNOSIS — I639 Cerebral infarction, unspecified: Secondary | ICD-10-CM | POA: Diagnosis not present

## 2022-05-07 DIAGNOSIS — R278 Other lack of coordination: Secondary | ICD-10-CM | POA: Diagnosis not present

## 2022-05-07 DIAGNOSIS — R4189 Other symptoms and signs involving cognitive functions and awareness: Secondary | ICD-10-CM | POA: Diagnosis not present

## 2022-05-07 DIAGNOSIS — I69328 Other speech and language deficits following cerebral infarction: Secondary | ICD-10-CM | POA: Diagnosis not present

## 2022-05-07 DIAGNOSIS — R499 Unspecified voice and resonance disorder: Secondary | ICD-10-CM | POA: Diagnosis not present

## 2022-05-08 DIAGNOSIS — I639 Cerebral infarction, unspecified: Secondary | ICD-10-CM | POA: Diagnosis not present

## 2022-05-08 DIAGNOSIS — R4189 Other symptoms and signs involving cognitive functions and awareness: Secondary | ICD-10-CM | POA: Diagnosis not present

## 2022-05-08 DIAGNOSIS — R278 Other lack of coordination: Secondary | ICD-10-CM | POA: Diagnosis not present

## 2022-05-08 DIAGNOSIS — I69328 Other speech and language deficits following cerebral infarction: Secondary | ICD-10-CM | POA: Diagnosis not present

## 2022-05-08 DIAGNOSIS — R499 Unspecified voice and resonance disorder: Secondary | ICD-10-CM | POA: Diagnosis not present

## 2022-05-08 DIAGNOSIS — M6281 Muscle weakness (generalized): Secondary | ICD-10-CM | POA: Diagnosis not present

## 2022-05-09 DIAGNOSIS — R4189 Other symptoms and signs involving cognitive functions and awareness: Secondary | ICD-10-CM | POA: Diagnosis not present

## 2022-05-09 DIAGNOSIS — M6281 Muscle weakness (generalized): Secondary | ICD-10-CM | POA: Diagnosis not present

## 2022-05-09 DIAGNOSIS — I639 Cerebral infarction, unspecified: Secondary | ICD-10-CM | POA: Diagnosis not present

## 2022-05-09 DIAGNOSIS — R278 Other lack of coordination: Secondary | ICD-10-CM | POA: Diagnosis not present

## 2022-05-09 DIAGNOSIS — R499 Unspecified voice and resonance disorder: Secondary | ICD-10-CM | POA: Diagnosis not present

## 2022-05-09 DIAGNOSIS — I69328 Other speech and language deficits following cerebral infarction: Secondary | ICD-10-CM | POA: Diagnosis not present

## 2022-05-10 DIAGNOSIS — R4189 Other symptoms and signs involving cognitive functions and awareness: Secondary | ICD-10-CM | POA: Diagnosis not present

## 2022-05-10 DIAGNOSIS — M6281 Muscle weakness (generalized): Secondary | ICD-10-CM | POA: Diagnosis not present

## 2022-05-10 DIAGNOSIS — R278 Other lack of coordination: Secondary | ICD-10-CM | POA: Diagnosis not present

## 2022-05-10 DIAGNOSIS — I69328 Other speech and language deficits following cerebral infarction: Secondary | ICD-10-CM | POA: Diagnosis not present

## 2022-05-10 DIAGNOSIS — I639 Cerebral infarction, unspecified: Secondary | ICD-10-CM | POA: Diagnosis not present

## 2022-05-10 DIAGNOSIS — R499 Unspecified voice and resonance disorder: Secondary | ICD-10-CM | POA: Diagnosis not present

## 2022-05-15 DIAGNOSIS — M6281 Muscle weakness (generalized): Secondary | ICD-10-CM | POA: Diagnosis not present

## 2022-05-15 DIAGNOSIS — R499 Unspecified voice and resonance disorder: Secondary | ICD-10-CM | POA: Diagnosis not present

## 2022-05-15 DIAGNOSIS — I69328 Other speech and language deficits following cerebral infarction: Secondary | ICD-10-CM | POA: Diagnosis not present

## 2022-05-15 DIAGNOSIS — R278 Other lack of coordination: Secondary | ICD-10-CM | POA: Diagnosis not present

## 2022-05-15 DIAGNOSIS — I639 Cerebral infarction, unspecified: Secondary | ICD-10-CM | POA: Diagnosis not present

## 2022-05-15 DIAGNOSIS — R4189 Other symptoms and signs involving cognitive functions and awareness: Secondary | ICD-10-CM | POA: Diagnosis not present

## 2022-05-16 DIAGNOSIS — I639 Cerebral infarction, unspecified: Secondary | ICD-10-CM | POA: Diagnosis not present

## 2022-05-16 DIAGNOSIS — R499 Unspecified voice and resonance disorder: Secondary | ICD-10-CM | POA: Diagnosis not present

## 2022-05-16 DIAGNOSIS — R4189 Other symptoms and signs involving cognitive functions and awareness: Secondary | ICD-10-CM | POA: Diagnosis not present

## 2022-05-16 DIAGNOSIS — R278 Other lack of coordination: Secondary | ICD-10-CM | POA: Diagnosis not present

## 2022-05-16 DIAGNOSIS — M6281 Muscle weakness (generalized): Secondary | ICD-10-CM | POA: Diagnosis not present

## 2022-05-16 DIAGNOSIS — I69328 Other speech and language deficits following cerebral infarction: Secondary | ICD-10-CM | POA: Diagnosis not present

## 2022-05-17 DIAGNOSIS — I639 Cerebral infarction, unspecified: Secondary | ICD-10-CM | POA: Diagnosis not present

## 2022-05-17 DIAGNOSIS — I69328 Other speech and language deficits following cerebral infarction: Secondary | ICD-10-CM | POA: Diagnosis not present

## 2022-05-17 DIAGNOSIS — R4189 Other symptoms and signs involving cognitive functions and awareness: Secondary | ICD-10-CM | POA: Diagnosis not present

## 2022-05-17 DIAGNOSIS — M6281 Muscle weakness (generalized): Secondary | ICD-10-CM | POA: Diagnosis not present

## 2022-05-17 DIAGNOSIS — R499 Unspecified voice and resonance disorder: Secondary | ICD-10-CM | POA: Diagnosis not present

## 2022-05-17 DIAGNOSIS — R278 Other lack of coordination: Secondary | ICD-10-CM | POA: Diagnosis not present

## 2022-05-21 DIAGNOSIS — I69328 Other speech and language deficits following cerebral infarction: Secondary | ICD-10-CM | POA: Diagnosis not present

## 2022-05-21 DIAGNOSIS — M6281 Muscle weakness (generalized): Secondary | ICD-10-CM | POA: Diagnosis not present

## 2022-05-21 DIAGNOSIS — R278 Other lack of coordination: Secondary | ICD-10-CM | POA: Diagnosis not present

## 2022-05-21 DIAGNOSIS — I639 Cerebral infarction, unspecified: Secondary | ICD-10-CM | POA: Diagnosis not present

## 2022-05-21 DIAGNOSIS — R499 Unspecified voice and resonance disorder: Secondary | ICD-10-CM | POA: Diagnosis not present

## 2022-05-21 DIAGNOSIS — R4189 Other symptoms and signs involving cognitive functions and awareness: Secondary | ICD-10-CM | POA: Diagnosis not present

## 2022-05-22 DIAGNOSIS — R499 Unspecified voice and resonance disorder: Secondary | ICD-10-CM | POA: Diagnosis not present

## 2022-05-22 DIAGNOSIS — R4189 Other symptoms and signs involving cognitive functions and awareness: Secondary | ICD-10-CM | POA: Diagnosis not present

## 2022-05-22 DIAGNOSIS — I639 Cerebral infarction, unspecified: Secondary | ICD-10-CM | POA: Diagnosis not present

## 2022-05-22 DIAGNOSIS — I69328 Other speech and language deficits following cerebral infarction: Secondary | ICD-10-CM | POA: Diagnosis not present

## 2022-05-22 DIAGNOSIS — M6281 Muscle weakness (generalized): Secondary | ICD-10-CM | POA: Diagnosis not present

## 2022-05-22 DIAGNOSIS — R278 Other lack of coordination: Secondary | ICD-10-CM | POA: Diagnosis not present

## 2022-05-23 DIAGNOSIS — R499 Unspecified voice and resonance disorder: Secondary | ICD-10-CM | POA: Diagnosis not present

## 2022-05-23 DIAGNOSIS — R278 Other lack of coordination: Secondary | ICD-10-CM | POA: Diagnosis not present

## 2022-05-23 DIAGNOSIS — I69328 Other speech and language deficits following cerebral infarction: Secondary | ICD-10-CM | POA: Diagnosis not present

## 2022-05-23 DIAGNOSIS — M6281 Muscle weakness (generalized): Secondary | ICD-10-CM | POA: Diagnosis not present

## 2022-05-23 DIAGNOSIS — I639 Cerebral infarction, unspecified: Secondary | ICD-10-CM | POA: Diagnosis not present

## 2022-05-23 DIAGNOSIS — R4189 Other symptoms and signs involving cognitive functions and awareness: Secondary | ICD-10-CM | POA: Diagnosis not present

## 2022-05-24 DIAGNOSIS — I639 Cerebral infarction, unspecified: Secondary | ICD-10-CM | POA: Diagnosis not present

## 2022-05-24 DIAGNOSIS — R4189 Other symptoms and signs involving cognitive functions and awareness: Secondary | ICD-10-CM | POA: Diagnosis not present

## 2022-05-24 DIAGNOSIS — R278 Other lack of coordination: Secondary | ICD-10-CM | POA: Diagnosis not present

## 2022-05-24 DIAGNOSIS — M6281 Muscle weakness (generalized): Secondary | ICD-10-CM | POA: Diagnosis not present

## 2022-05-24 DIAGNOSIS — I69328 Other speech and language deficits following cerebral infarction: Secondary | ICD-10-CM | POA: Diagnosis not present

## 2022-05-24 DIAGNOSIS — R499 Unspecified voice and resonance disorder: Secondary | ICD-10-CM | POA: Diagnosis not present

## 2022-05-28 DIAGNOSIS — R499 Unspecified voice and resonance disorder: Secondary | ICD-10-CM | POA: Diagnosis not present

## 2022-05-28 DIAGNOSIS — I639 Cerebral infarction, unspecified: Secondary | ICD-10-CM | POA: Diagnosis not present

## 2022-05-28 DIAGNOSIS — I69328 Other speech and language deficits following cerebral infarction: Secondary | ICD-10-CM | POA: Diagnosis not present

## 2022-05-28 DIAGNOSIS — R4189 Other symptoms and signs involving cognitive functions and awareness: Secondary | ICD-10-CM | POA: Diagnosis not present

## 2022-05-28 DIAGNOSIS — R278 Other lack of coordination: Secondary | ICD-10-CM | POA: Diagnosis not present

## 2022-05-28 DIAGNOSIS — M6281 Muscle weakness (generalized): Secondary | ICD-10-CM | POA: Diagnosis not present

## 2022-05-29 DIAGNOSIS — R499 Unspecified voice and resonance disorder: Secondary | ICD-10-CM | POA: Diagnosis not present

## 2022-05-29 DIAGNOSIS — I69328 Other speech and language deficits following cerebral infarction: Secondary | ICD-10-CM | POA: Diagnosis not present

## 2022-05-29 DIAGNOSIS — R4189 Other symptoms and signs involving cognitive functions and awareness: Secondary | ICD-10-CM | POA: Diagnosis not present

## 2022-05-29 DIAGNOSIS — I639 Cerebral infarction, unspecified: Secondary | ICD-10-CM | POA: Diagnosis not present

## 2022-05-29 DIAGNOSIS — R278 Other lack of coordination: Secondary | ICD-10-CM | POA: Diagnosis not present

## 2022-05-29 DIAGNOSIS — M6281 Muscle weakness (generalized): Secondary | ICD-10-CM | POA: Diagnosis not present

## 2022-05-31 DIAGNOSIS — R4189 Other symptoms and signs involving cognitive functions and awareness: Secondary | ICD-10-CM | POA: Diagnosis not present

## 2022-05-31 DIAGNOSIS — I639 Cerebral infarction, unspecified: Secondary | ICD-10-CM | POA: Diagnosis not present

## 2022-05-31 DIAGNOSIS — M6281 Muscle weakness (generalized): Secondary | ICD-10-CM | POA: Diagnosis not present

## 2022-05-31 DIAGNOSIS — R278 Other lack of coordination: Secondary | ICD-10-CM | POA: Diagnosis not present

## 2022-05-31 DIAGNOSIS — I69328 Other speech and language deficits following cerebral infarction: Secondary | ICD-10-CM | POA: Diagnosis not present

## 2022-05-31 DIAGNOSIS — R499 Unspecified voice and resonance disorder: Secondary | ICD-10-CM | POA: Diagnosis not present

## 2022-06-04 DIAGNOSIS — M6281 Muscle weakness (generalized): Secondary | ICD-10-CM | POA: Diagnosis not present

## 2022-06-04 DIAGNOSIS — R278 Other lack of coordination: Secondary | ICD-10-CM | POA: Diagnosis not present

## 2022-06-04 DIAGNOSIS — R499 Unspecified voice and resonance disorder: Secondary | ICD-10-CM | POA: Diagnosis not present

## 2022-06-04 DIAGNOSIS — R4189 Other symptoms and signs involving cognitive functions and awareness: Secondary | ICD-10-CM | POA: Diagnosis not present

## 2022-06-04 DIAGNOSIS — I69328 Other speech and language deficits following cerebral infarction: Secondary | ICD-10-CM | POA: Diagnosis not present

## 2022-06-04 DIAGNOSIS — F330 Major depressive disorder, recurrent, mild: Secondary | ICD-10-CM | POA: Diagnosis not present

## 2022-06-04 DIAGNOSIS — I639 Cerebral infarction, unspecified: Secondary | ICD-10-CM | POA: Diagnosis not present

## 2022-06-05 DIAGNOSIS — R278 Other lack of coordination: Secondary | ICD-10-CM | POA: Diagnosis not present

## 2022-06-05 DIAGNOSIS — I69328 Other speech and language deficits following cerebral infarction: Secondary | ICD-10-CM | POA: Diagnosis not present

## 2022-06-05 DIAGNOSIS — R499 Unspecified voice and resonance disorder: Secondary | ICD-10-CM | POA: Diagnosis not present

## 2022-06-05 DIAGNOSIS — M6281 Muscle weakness (generalized): Secondary | ICD-10-CM | POA: Diagnosis not present

## 2022-06-05 DIAGNOSIS — R4189 Other symptoms and signs involving cognitive functions and awareness: Secondary | ICD-10-CM | POA: Diagnosis not present

## 2022-06-05 DIAGNOSIS — I639 Cerebral infarction, unspecified: Secondary | ICD-10-CM | POA: Diagnosis not present

## 2022-06-07 DIAGNOSIS — M6281 Muscle weakness (generalized): Secondary | ICD-10-CM | POA: Diagnosis not present

## 2022-06-07 DIAGNOSIS — R278 Other lack of coordination: Secondary | ICD-10-CM | POA: Diagnosis not present

## 2022-06-07 DIAGNOSIS — I639 Cerebral infarction, unspecified: Secondary | ICD-10-CM | POA: Diagnosis not present

## 2022-06-07 DIAGNOSIS — I69328 Other speech and language deficits following cerebral infarction: Secondary | ICD-10-CM | POA: Diagnosis not present

## 2022-06-07 DIAGNOSIS — R499 Unspecified voice and resonance disorder: Secondary | ICD-10-CM | POA: Diagnosis not present

## 2022-06-07 DIAGNOSIS — R4189 Other symptoms and signs involving cognitive functions and awareness: Secondary | ICD-10-CM | POA: Diagnosis not present

## 2022-06-18 DIAGNOSIS — F330 Major depressive disorder, recurrent, mild: Secondary | ICD-10-CM | POA: Diagnosis not present

## 2022-06-21 ENCOUNTER — Other Ambulatory Visit: Payer: Self-pay | Admitting: Internal Medicine

## 2022-06-21 DIAGNOSIS — Z1231 Encounter for screening mammogram for malignant neoplasm of breast: Secondary | ICD-10-CM

## 2022-07-12 DIAGNOSIS — R278 Other lack of coordination: Secondary | ICD-10-CM | POA: Diagnosis not present

## 2022-07-12 DIAGNOSIS — Z23 Encounter for immunization: Secondary | ICD-10-CM | POA: Diagnosis not present

## 2022-07-12 DIAGNOSIS — M6281 Muscle weakness (generalized): Secondary | ICD-10-CM | POA: Diagnosis not present

## 2022-07-17 DIAGNOSIS — M6281 Muscle weakness (generalized): Secondary | ICD-10-CM | POA: Diagnosis not present

## 2022-07-17 DIAGNOSIS — R278 Other lack of coordination: Secondary | ICD-10-CM | POA: Diagnosis not present

## 2022-07-18 DIAGNOSIS — N390 Urinary tract infection, site not specified: Secondary | ICD-10-CM | POA: Diagnosis not present

## 2022-07-19 DIAGNOSIS — R278 Other lack of coordination: Secondary | ICD-10-CM | POA: Diagnosis not present

## 2022-07-19 DIAGNOSIS — M6281 Muscle weakness (generalized): Secondary | ICD-10-CM | POA: Diagnosis not present

## 2022-07-20 ENCOUNTER — Non-Acute Institutional Stay: Payer: Medicare Other | Admitting: Nurse Practitioner

## 2022-07-20 ENCOUNTER — Encounter: Payer: Self-pay | Admitting: Nurse Practitioner

## 2022-07-20 DIAGNOSIS — I10 Essential (primary) hypertension: Secondary | ICD-10-CM

## 2022-07-20 DIAGNOSIS — Z933 Colostomy status: Secondary | ICD-10-CM

## 2022-07-20 DIAGNOSIS — E538 Deficiency of other specified B group vitamins: Secondary | ICD-10-CM | POA: Diagnosis not present

## 2022-07-20 DIAGNOSIS — K5901 Slow transit constipation: Secondary | ICD-10-CM | POA: Diagnosis not present

## 2022-07-20 DIAGNOSIS — R300 Dysuria: Secondary | ICD-10-CM | POA: Insufficient documentation

## 2022-07-20 DIAGNOSIS — E785 Hyperlipidemia, unspecified: Secondary | ICD-10-CM

## 2022-07-20 DIAGNOSIS — I63342 Cerebral infarction due to thrombosis of left cerebellar artery: Secondary | ICD-10-CM | POA: Diagnosis not present

## 2022-07-20 DIAGNOSIS — M1712 Unilateral primary osteoarthritis, left knee: Secondary | ICD-10-CM | POA: Diagnosis not present

## 2022-07-20 DIAGNOSIS — F339 Major depressive disorder, recurrent, unspecified: Secondary | ICD-10-CM

## 2022-07-20 DIAGNOSIS — E876 Hypokalemia: Secondary | ICD-10-CM

## 2022-07-20 DIAGNOSIS — D500 Iron deficiency anemia secondary to blood loss (chronic): Secondary | ICD-10-CM | POA: Diagnosis not present

## 2022-07-20 DIAGNOSIS — N390 Urinary tract infection, site not specified: Secondary | ICD-10-CM

## 2022-07-20 NOTE — Assessment & Plan Note (Signed)
takes Vit B, Vit B12 942

## 2022-07-20 NOTE — Assessment & Plan Note (Signed)
Stable, takes MiraLax Colace

## 2022-07-20 NOTE — Progress Notes (Unsigned)
Location:  [facility] Room Number: NO/913/A Place of Service:  ALF [phone]) Provider:  Verina Galeno X, NP Virgie Dad, MD  Patient Care Team: Virgie Dad, MD as PCP - General (Internal Medicine) Viona Gilmore, [facility] as Pharmacist (Pharmacist)  Extended Emergency Contact Information Primary Emergency Contact: [place] of [employer] Phone: [phone] Relation: Daughter  Code Status:  FULL Goals of care: Advanced Directive information    03/26/2022    1:58 PM  Advanced Directives  Does Patient Have a Medical Advance Directive? Yes  Type of Paramedic of [place];Living will  Does patient want to make changes to medical advance directive? No - Patient declined  Copy of [place] in Chart? Yes - validated most recent copy scanned in chart (See row information)     Chief Complaint  Patient presents with   Acute Visit    Patient is being seen for dysuria    HPI:  Pt is a 88 y.o. female seen today for an acute visit for c/o burning sensation upon urination, denied abd or lower back pain, afebrile, in her usual state of health.   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, s/p Hartman's procedure, splenic flexure mobilization             HTN takes Amlodipine Bun/creat 18/0.7 03/20/22             Hypomagnesemia, Mg 1.8 12/20/20             Vit B12 deficiency, takes Vit B, Vit B12 942             Hx of CVA w/o residual deficit             Hyperlipidemia, takes Rosuvastatin, LDL 72 12/20/20             Depression, takes Venlafaxine             Constipation, takes MiraLax Colace             Post op anemia, off Fe Hgb 13.1 04/17/22             Hypokalemia, supplemented, takes Kcl, serum K 3.7 03/20/22             Allergic rhinitis, takes Flonase   OA, pain is controlled, takes Tylenol.      Past Medical History:  Diagnosis Date   Arthritis    Depression     Hyperlipidemia    Osteopenia    Stroke ([place])    Thrombocytopenia (HCC)    Vitamin D deficiency    Wrist fracture 2002   left   Past Surgical History:  Procedure Laterality Date   APPENDECTOMY  1967   CARPAL TUNNEL RELEASE  2008   right   COLON RESECTION SIGMOID N/A 12/03/2020   Procedure: EXPLORATORY LAPAROTOMY; HARTMAN'S PROCEDURE; SPLEENIC FLEXURE MOBILIZATION;  Surgeon: Leighton Ruff, MD;  Location: [facility];  Service: General;  Laterality: N/A;   COLOSTOMY N/A 12/03/2020   Procedure: COLOSTOMY;  Surgeon: Leighton Ruff, MD;  Location: [facility];  Service: General;  Laterality: N/A;   JOINT REPLACEMENT  2002   right total     Allergies  Allergen Reactions   Bactrim [Sulfamethoxazole-Trimethoprim] Nausea Only   Ditropan [Oxybutynin]     Made patient feel bad   Sulfa Antibiotics     Other reaction(s): stomach upset    Outpatient Encounter Medications as of 07/20/2022  Medication Sig   acetaminophen (  TYLENOL) 325 MG tablet Take 2 tablets (650 mg total) by mouth every 6 (six) hours as needed for mild pain or moderate pain.   acetaminophen (TYLENOL) 500 MG tablet Take 1,000 mg by mouth 2 (two) times daily.   alum & mag hydroxide-simeth (MAALOX/MYLANTA) 200-200-20 MG/5ML suspension Take 30 mLs by mouth every 4 (four) hours as needed for indigestion or heartburn.   amLODipine (NORVASC) 2.5 MG tablet Take 2.5 mg by mouth daily.   aspirin EC 325 MG EC tablet Take 1 tablet (325 mg total) by mouth daily.   Biotin 10 MG TABS Take 10 mg by mouth daily.    calcium carbonate (OSCAL) 1500 (600 Ca) MG TABS tablet Take 600 mg of elemental calcium by mouth daily.   Charcoal Activated 260 MG CAPS Take 1 capsule by mouth every 6 (six) hours as needed (For gas).   CHELATED MAGNESIUM PO Take 300 mg by mouth daily.   Cholecalciferol (VITAMIN D3) 2000 units capsule Take 2,000 Units by mouth daily.   diclofenac Sodium (VOLTAREN ARTHRITIS PAIN) 1 % GEL Apply 2 g topically 3 (three) times daily as needed  (Left wrist).   doxycycline (VIBRAMYCIN) 100 MG capsule Take 100 mg by mouth 2 (two) times daily.   fluticasone (FLONASE) 50 MCG/ACT nasal spray Place 1 spray into both nostrils 2 (two) times daily.   latanoprost (XALATAN) 0.005 % ophthalmic solution Place 1 drop into the left eye at bedtime.   loperamide (IMODIUM A-D) 2 MG tablet Take 2 mg by mouth as needed for diarrhea or loose stools.   magnesium hydroxide (MILK OF MAGNESIA) 400 MG/5ML suspension Take 30 mLs by mouth daily as needed for mild constipation.   minoxidil (ROGAINE) 2 % external solution Apply topically daily as needed.   polyethylene glycol (MIRALAX / GLYCOLAX) 17 g packet Take 17 g by mouth daily.   potassium chloride SA (KLOR-CON) 20 MEQ tablet Take 20 mEq by mouth daily.   rosuvastatin (CRESTOR) 10 MG tablet TAKE 1 TABLET ONCE DAILY.   simethicone (MYLICON) 80 MG chewable tablet Chew 80 mg by mouth 4 (four) times daily as needed.   venlafaxine XR (EFFEXOR-XR) 75 MG 24 hr capsule TAKE 2 CAPSULES ('150MG'$ ) BY MOUTH DAILY.   vitamin B-12 (CYANOCOBALAMIN) 1000 MCG tablet Take 1,000 mcg by mouth daily.   Vitamin E 45 MG (100 UNIT) CAPS Take 1 capsule by mouth daily.   No facility-administered encounter medications on file as of 07/20/2022.    Review of Systems  Constitutional:  Negative for appetite change, fatigue and fever.  HENT:  Negative for congestion, hearing loss and rhinorrhea.   Eyes:  Negative for visual disturbance.  Respiratory:  Negative for cough, shortness of breath and wheezing.   Cardiovascular:  Positive for leg swelling.  Gastrointestinal:  Negative for abdominal pain and constipation.  Genitourinary:  Positive for dysuria and frequency. Negative for urgency.  Musculoskeletal:  Positive for arthralgias and gait problem.       Left knee pain is chronic  Skin:  Negative for color change.  Neurological:  Negative for speech difficulty, weakness and light-headedness.  Psychiatric/Behavioral:  Negative for  confusion and sleep disturbance. The patient is not nervous/anxious.     Immunization History  Administered Date(s) Administered   Fluad Quad(high Dose 65+) 06/13/2019   Influenza Split 06/18/2012   Influenza Whole 07/11/2010   Influenza, High Dose Seasonal PF 07/07/2013, 07/14/2014, 06/24/2015, 06/13/2016, 06/04/2017, 06/17/2018   Influenza-Unspecified 06/22/2020, 06/29/2021   Moderna Covid-19 Vaccine Bivalent Booster 43yr & up  01/26/2022   Moderna SARS-COV2 Booster Vaccination 07/19/2020, 02/07/2021   Moderna Sars-Covid-2 Vaccination 09/14/2019, 10/12/2019   PFIZER(Purple Top)SARS-COV-2 Vaccination 05/30/2021   Pneumococcal Conjugate-13 04/27/2014   Pneumococcal Polysaccharide-23 10/15/2017   Td 09/10/2005   Zoster Recombinat (Shingrix) 02/28/2022   Zoster, Live 11/29/2006   Pertinent  Health Maintenance Due  Topic Date Due   INFLUENZA VACCINE  04/10/2022   DEXA SCAN  Completed      12/13/2020    3:00 PM 12/14/2020    5:00 AM 12/14/2020    8:00 AM 12/14/2020    8:29 PM 12/15/2020    8:44 AM  Fall Risk  Patient Fall Risk Level Moderate fall risk Moderate fall risk Moderate fall risk Moderate fall risk High fall risk   Functional Status Survey:    Vitals:   07/20/22 0940  BP: (!) 154/76  Pulse: 78  Resp: 18  Temp: (!) 97.2 F (36.2 C)  SpO2: 95%  Weight: 168 lb 12.8 oz (76.6 kg)  Height: '5\' 3"'$  (1.6 m)   Body mass index is 29.9 kg/m. Physical Exam Vitals and nursing note reviewed.  Constitutional:      Appearance: Normal appearance.  HENT:     Head: Normocephalic and atraumatic.     Nose: No congestion or rhinorrhea.     Mouth/Throat:     Mouth: Mucous membranes are moist.  Eyes:     Extraocular Movements: Extraocular movements intact.     Conjunctiva/sclera: Conjunctivae normal.     Pupils: Pupils are equal, round, and reactive to light.  Cardiovascular:     Rate and Rhythm: Normal rate and regular rhythm.     Heart sounds: No murmur heard. Pulmonary:      Effort: Pulmonary effort is normal.     Breath sounds: No rales.  Abdominal:     General: Bowel sounds are normal.     Palpations: Abdomen is soft.     Tenderness: There is no abdominal tenderness.     Comments: Colostomy  Musculoskeletal:        General: No tenderness.     Cervical back: Normal range of motion and neck supple.     Right lower leg: Edema present.     Left lower leg: Edema present.     Comments: Trace edema in ankles.   Skin:    General: Skin is warm and dry.     Comments: Mid abd surgical scar. Colostomy is functioning.   Neurological:     General: No focal deficit present.     Mental Status: She is alert and oriented to person, place, and time. Mental status is at baseline.     Gait: Gait abnormal.  Psychiatric:        Mood and Affect: Mood normal.        Behavior: Behavior normal.        Thought Content: Thought content normal.        Judgment: Judgment normal.     Labs reviewed: Recent Labs    11/22/21 0000 03/20/22 0000  NA 143 142  K 3.6 3.7  CL 104 104  CO2 26* 28*  BUN 19 18  CREATININE 0.6 0.7  CALCIUM 9.4 9.5   Recent Labs    11/22/21 0000 03/20/22 0000  AST 15 15  ALT 12 14  ALKPHOS 65 63  ALBUMIN 4.0 4.6   Recent Labs    11/22/21 0000  WBC 5.4  NEUTROABS 2,900.00  HGB 14.4  HCT 44  PLT 119*   Lab  Results  Component Value Date   TSH 1.40 12/21/2020   Lab Results  Component Value Date   HGBA1C 5.9 (H) 09/26/2015   Lab Results  Component Value Date   CHOL 136 12/21/2020   HDL 93 (A) 12/21/2020   LDLCALC 72 12/21/2020   LDLDIRECT 147.0 11/17/2012   TRIG 119 12/21/2020   CHOLHDL 3 02/10/2020    Significant Diagnostic Results in last 30 days:  No results found.  Assessment/Plan UTI (urinary tract infection)  c/o burning sensation upon urination, denied abd or lower back pain, afebrile, in her usual state of health. Pending UA C/S 07/20/22 +nitrite, 3+ leukocyte esterase, wbc>60, many bacteria. Empirical tx Cipro  '500mg'$  q12hr x 7days.  07/24/22 E-Coli, dc Cipro-R, start Augmentin 875/'125mg'$  q12hrs x 7 days per urine culture result.  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, s/p Hartman's procedure, splenic flexure mobilization  Essential hypertension Blood pressure is controlled, takes Amlodipine Bun/creat 18/0.7 03/20/22  Hypomagnesemia  Mg 1.8 12/20/20  Vitamin B12 deficiency  takes Vit B, Vit B12 942  Cerebral infarction (HCC) Hx of CVA w/o residual deficit  Hyperlipidemia LDL goal <70 takes Rosuvastatin, LDL 72 12/20/20  Depression, recurrent ([place]) Her mood is stable, takes Venlafaxine  Slow transit constipation Stable, takes MiraLax Colace  Blood loss anemia  off Fe Hgb 13.1 04/17/22  Hypokalemia  supplemented, takes Kcl, serum K 3.7 03/20/22  Primary osteoarthritis of left knee pain is controlled, takes Tylenol.      Family/ staff Communication: plan of care reviewed with the patient and charge nurse.   Labs/tests ordered: UA C/S pending  Time spend 40 minutes.

## 2022-07-20 NOTE — Assessment & Plan Note (Signed)
Hx of CVA w/o residual deficit

## 2022-07-20 NOTE — Assessment & Plan Note (Signed)
Mg 1.8 12/20/20

## 2022-07-20 NOTE — Assessment & Plan Note (Signed)
Her mood is stable, takes Venlafaxine

## 2022-07-20 NOTE — Assessment & Plan Note (Signed)
supplemented, takes Kcl, serum K 3.7 03/20/22

## 2022-07-20 NOTE — Assessment & Plan Note (Signed)
off Fe Hgb 13.1 04/17/22

## 2022-07-20 NOTE — Assessment & Plan Note (Signed)
c/o burning sensation upon urination, denied abd or lower back pain, afebrile, in her usual state of health. Pending UA C/S 07/20/22 +nitrite, 3+ leukocyte esterase, wbc>60, many bacteria. Empirical tx Cipro '500mg'$  q12hr x 7days.  07/24/22 E-Coli, dc Cipro-R, start Augmentin 875/'125mg'$  q12hrs x 7 days per urine culture result.

## 2022-07-20 NOTE — Assessment & Plan Note (Signed)
takes Rosuvastatin, LDL 72 12/20/20

## 2022-07-20 NOTE — Assessment & Plan Note (Signed)
Blood pressure is controlled, takes Amlodipine Bun/creat 18/0.7 03/20/22

## 2022-07-20 NOTE — Assessment & Plan Note (Signed)
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

## 2022-07-20 NOTE — Assessment & Plan Note (Signed)
pain is controlled, takes Tylenol.

## 2022-07-23 ENCOUNTER — Encounter: Payer: Self-pay | Admitting: Nurse Practitioner

## 2022-07-23 DIAGNOSIS — M6281 Muscle weakness (generalized): Secondary | ICD-10-CM | POA: Diagnosis not present

## 2022-07-23 DIAGNOSIS — R278 Other lack of coordination: Secondary | ICD-10-CM | POA: Diagnosis not present

## 2022-07-26 DIAGNOSIS — R278 Other lack of coordination: Secondary | ICD-10-CM | POA: Diagnosis not present

## 2022-07-26 DIAGNOSIS — M6281 Muscle weakness (generalized): Secondary | ICD-10-CM | POA: Diagnosis not present

## 2022-07-30 DIAGNOSIS — M6281 Muscle weakness (generalized): Secondary | ICD-10-CM | POA: Diagnosis not present

## 2022-07-30 DIAGNOSIS — R278 Other lack of coordination: Secondary | ICD-10-CM | POA: Diagnosis not present

## 2022-07-31 ENCOUNTER — Ambulatory Visit: Payer: Medicare Other

## 2022-08-07 DIAGNOSIS — F330 Major depressive disorder, recurrent, mild: Secondary | ICD-10-CM | POA: Diagnosis not present

## 2022-08-08 ENCOUNTER — Non-Acute Institutional Stay: Payer: Medicare Other | Admitting: Family Medicine

## 2022-08-08 DIAGNOSIS — D500 Iron deficiency anemia secondary to blood loss (chronic): Secondary | ICD-10-CM | POA: Diagnosis not present

## 2022-08-08 DIAGNOSIS — K5720 Diverticulitis of large intestine with perforation and abscess without bleeding: Secondary | ICD-10-CM

## 2022-08-08 DIAGNOSIS — I63342 Cerebral infarction due to thrombosis of left cerebellar artery: Secondary | ICD-10-CM

## 2022-08-08 DIAGNOSIS — Z862 Personal history of diseases of the blood and blood-forming organs and certain disorders involving the immune mechanism: Secondary | ICD-10-CM

## 2022-08-08 DIAGNOSIS — I10 Essential (primary) hypertension: Secondary | ICD-10-CM | POA: Diagnosis not present

## 2022-08-08 DIAGNOSIS — F339 Major depressive disorder, recurrent, unspecified: Secondary | ICD-10-CM

## 2022-08-08 NOTE — Progress Notes (Signed)
Provider:  Alain Honey, MD Location:      Place of Service:     PCP: Virgie Dad, MD Patient Care Team: Virgie Dad, MD as PCP - General (Internal Medicine) Viona Gilmore, [facility] as Pharmacist (Pharmacist)  Extended Emergency Contact Information Primary Emergency Contact: [place] of [employer] Phone: [phone] Relation: Daughter  Code Status:  Goals of Care: Advanced Directive information    03/26/2022    1:58 PM  Advanced Directives  Does Patient Have a Medical Advance Directive? Yes  Type of Paramedic of [place];Living will  Does patient want to make changes to medical advance directive? No - Patient declined  Copy of [place] in Chart? Yes - validated most recent copy scanned in chart (See row information)      No chief complaint on file.   HPI: Patient is a 88 y.o. female seen today for medical management of chronic problems including, hypertension, depression. This is a delightful lady.  We talked at length about her history and adjustments to living in an assisted living.  She and her husband had moved here from [place].  He died soon after the move with Parkinson's.  Thereafter she developed some depression for which she is doing well.  She also had a stroke from which she has made full recovery.  In December 2021 she was having some bowel irregularities and had what sounds like diverticular abscess that resulted in colostomy.  She has done very well with colostomy and there are no plans for reversal. Past Medical History:  Diagnosis Date   Arthritis    Depression    Hyperlipidemia    Osteopenia    Stroke ([place])    Thrombocytopenia ([place])    Vitamin D deficiency    Wrist fracture 2002   left   Past Surgical History:  Procedure Laterality Date   APPENDECTOMY  1967   CARPAL TUNNEL RELEASE  2008   right   COLON RESECTION SIGMOID N/A 12/03/2020   Procedure: EXPLORATORY LAPAROTOMY;  HARTMAN'S PROCEDURE; SPLEENIC FLEXURE MOBILIZATION;  Surgeon: Leighton Ruff, MD;  [facility];  Service: General;  Laterality: N/A;   COLOSTOMY N/A 12/03/2020   Procedure: COLOSTOMY;  Surgeon: Leighton Ruff, MD;  [facility];  Service: General;  Laterality: N/A;   JOINT REPLACEMENT  2002   right total     reports that she has never smoked. She has never used smokeless tobacco. She reports that she does not drink alcohol and does not use drugs. Social History   Socioeconomic History   Marital status: Widowed    Spouse name: Not on file   Number of children: 4   Years of education: 16   Highest education level: Bachelor's degree (e.g., BA, AB, BS)  Occupational History   Not on file  Tobacco Use   Smoking status: Never   Smokeless tobacco: Never  Vaping Use   Vaping Use: Never used  Substance and Sexual Activity   Alcohol use: No   Drug use: No   Sexual activity: Not on file  Other Topics Concern   Not on file  Social History Narrative   Lives in senior apartment at [facility]   widowed   Social Determinants of Health   Financial Resource Strain: Low Risk  (10/16/2019)   Overall Financial Resource Strain (CARDIA)    Difficulty of Paying Living Expenses: Not hard at all  Food Insecurity: No Food Insecurity (10/16/2019)   Hunger Vital  Sign    Worried About Charity fundraiser in the Last Year: Never true    [place] in the Last Year: Never true  Transportation Needs: No Transportation Needs (10/16/2019)   PRAPARE - Hydrologist (Medical): No    Lack of Transportation (Non-Medical): No  Physical Activity: Inactive (10/16/2019)   Exercise Vital Sign    Days of Exercise per Week: 0 days    Minutes of Exercise per Session: 0 min  Stress: No Stress Concern Present (10/16/2019)   [place]    Feeling of Stress : Only a little  Social Connections: Unknown (10/16/2019)    Social Connection and Isolation Panel [NHANES]    Frequency of Communication with Friends and Family: More than three times a week    Frequency of Social Gatherings with Friends and Family: Once a week    Attends Religious Services: Not on Advertising copywriter or Organizations: Yes    Attends Archivist Meetings: Not on file    Marital Status: Widowed  Intimate Partner Violence: Not on file    Functional Status Survey:    Family History  Problem Relation Age of Onset   Breast cancer Mother    Breast cancer Sister     Health Maintenance  Topic Date Due   INFLUENZA VACCINE  04/10/2022   Zoster Vaccines- Shingrix (2 of 2) 04/25/2022   COVID-19 Vaccine (5 - 2023-24 season) 05/11/2022   Medicare Annual Wellness (AWV)  09/01/2022   Pneumonia Vaccine 35+ Years old  Completed   DEXA SCAN  Completed   HPV VACCINES  Aged Out    Allergies  Allergen Reactions   Bactrim [Sulfamethoxazole-Trimethoprim] Nausea Only   Ditropan [Oxybutynin]     Made patient feel bad   Sulfa Antibiotics     Other reaction(s): stomach upset    Outpatient Encounter Medications as of 08/08/2022  Medication Sig   acetaminophen (TYLENOL) 325 MG tablet Take 2 tablets (650 mg total) by mouth every 6 (six) hours as needed for mild pain or moderate pain.   acetaminophen (TYLENOL) 500 MG tablet Take 1,000 mg by mouth 2 (two) times daily.   alum & mag hydroxide-simeth (MAALOX/MYLANTA) 200-200-20 MG/5ML suspension Take 30 mLs by mouth every 4 (four) hours as needed for indigestion or heartburn.   amLODipine (NORVASC) 2.5 MG tablet Take 2.5 mg by mouth daily.   aspirin EC 325 MG EC tablet Take 1 tablet (325 mg total) by mouth daily.   Biotin 10 MG TABS Take 10 mg by mouth daily.    calcium carbonate (OSCAL) 1500 (600 Ca) MG TABS tablet Take 600 mg of elemental calcium by mouth daily.   Charcoal Activated 260 MG CAPS Take 1 capsule by mouth every 6 (six) hours as needed (For gas).   CHELATED  MAGNESIUM PO Take 300 mg by mouth daily.   Cholecalciferol (VITAMIN D3) 2000 units capsule Take 2,000 Units by mouth daily.   diclofenac Sodium (VOLTAREN ARTHRITIS PAIN) 1 % GEL Apply 2 g topically 3 (three) times daily as needed (Left wrist).   doxycycline (VIBRAMYCIN) 100 MG capsule Take 100 mg by mouth 2 (two) times daily.   fluticasone (FLONASE) 50 MCG/ACT nasal spray Place 1 spray into both nostrils 2 (two) times daily.   latanoprost (XALATAN) 0.005 % ophthalmic solution Place 1 drop into the left eye at bedtime.   loperamide (IMODIUM A-D) 2 MG  tablet Take 2 mg by mouth as needed for diarrhea or loose stools.   magnesium hydroxide (MILK OF MAGNESIA) 400 MG/5ML suspension Take 30 mLs by mouth daily as needed for mild constipation.   minoxidil (ROGAINE) 2 % external solution Apply topically daily as needed.   polyethylene glycol (MIRALAX / GLYCOLAX) 17 g packet Take 17 g by mouth daily.   potassium chloride SA (KLOR-CON) 20 MEQ tablet Take 20 mEq by mouth daily.   rosuvastatin (CRESTOR) 10 MG tablet TAKE 1 TABLET ONCE DAILY.   simethicone (MYLICON) 80 MG chewable tablet Chew 80 mg by mouth 4 (four) times daily as needed.   venlafaxine XR (EFFEXOR-XR) 75 MG 24 hr capsule TAKE 2 CAPSULES ('150MG'$ ) BY MOUTH DAILY.   vitamin B-12 (CYANOCOBALAMIN) 1000 MCG tablet Take 1,000 mcg by mouth daily.   Vitamin E 45 MG (100 UNIT) CAPS Take 1 capsule by mouth daily.   No facility-administered encounter medications on file as of 08/08/2022.    Review of Systems  Constitutional: Negative.   HENT: Negative.    Respiratory: Negative.    Cardiovascular: Negative.   Genitourinary: Negative.   Musculoskeletal:  Positive for back pain and gait problem.  Hematological: Negative.   Psychiatric/Behavioral: Negative.    All other systems reviewed and are negative.   There were no vitals filed for this visit. There is no height or weight on file to calculate BMI. Physical Exam Vitals and nursing note  reviewed.  Constitutional:      Appearance: Normal appearance.  HENT:     Head: Normocephalic.     Mouth/Throat:     Mouth: Mucous membranes are moist.     Pharynx: Oropharynx is clear.  Eyes:     Extraocular Movements: Extraocular movements intact.     Pupils: Pupils are equal, round, and reactive to light.  Cardiovascular:     Rate and Rhythm: Normal rate and regular rhythm.  Pulmonary:     Effort: Pulmonary effort is normal.     Breath sounds: Normal breath sounds.  Abdominal:     General: Bowel sounds are normal.     Palpations: Abdomen is soft.  Musculoskeletal:        General: Normal range of motion.  Skin:    General: Skin is warm and dry.  Neurological:     General: No focal deficit present.     Mental Status: She is alert and oriented to person, place, and time.  Psychiatric:        Mood and Affect: Mood normal.        Behavior: Behavior normal.     Labs reviewed: Basic Metabolic Panel: Recent Labs    11/22/21 0000 03/20/22 0000  NA 143 142  K 3.6 3.7  CL 104 104  CO2 26* 28*  BUN 19 18  CREATININE 0.6 0.7  CALCIUM 9.4 9.5   Liver Function Tests: Recent Labs    11/22/21 0000 03/20/22 0000  AST 15 15  ALT 12 14  ALKPHOS 65 63  ALBUMIN 4.0 4.6   No results for input(s): "LIPASE", "AMYLASE" in the last 8760 hours. No results for input(s): "AMMONIA" in the last 8760 hours. CBC: Recent Labs    11/22/21 0000  WBC 5.4  NEUTROABS 2,900.00  HGB 14.4  HCT 44  PLT 119*   Cardiac Enzymes: No results for input(s): "CKTOTAL", "CKMB", "CKMBINDEX", "TROPONINI" in the last 8760 hours. BNP: Invalid input(s): "POCBNP" Lab Results  Component Value Date   HGBA1C 5.9 (H) 09/26/2015  Lab Results  Component Value Date   TSH 1.40 12/21/2020   Lab Results  Component Value Date   UJWJXBJY78 295 12/21/2020   No results found for: "FOLATE" Lab Results  Component Value Date   IRON 22 (L) 04/22/2015   TIBC 402 04/22/2015   FERRITIN 23 04/22/2015     Imaging and Procedures obtained prior to SNF admission: MM 3D SCREEN BREAST BILATERAL  Result Date: 07/12/2021 CLINICAL DATA:  Screening. EXAM: DIGITAL SCREENING BILATERAL MAMMOGRAM WITH TOMOSYNTHESIS AND CAD TECHNIQUE: Bilateral screening digital craniocaudal and mediolateral oblique mammograms were obtained. Bilateral screening digital breast tomosynthesis was performed. The images were evaluated with computer-aided detection. COMPARISON:  Previous exam(s). ACR Breast Density Category b: There are scattered areas of fibroglandular density. FINDINGS: There are no findings suspicious for malignancy. IMPRESSION: No mammographic evidence of malignancy. A result letter of this screening mammogram will be mailed directly to the patient. RECOMMENDATION: Screening mammogram in one year. (Code:SM-B-01Y) BI-RADS CATEGORY  1: Negative. Electronically Signed   By: Ammie Ferrier M.D.   On: 07/12/2021 11:57    Assessment/Plan 1. Abscess of sigmoid colon due to diverticulitis Abscess surgically drained and resected and patient ended up with a colostomy that she has had for almost 2 years now.  Does very well and is accepting of this.  2. Blood loss anemia She is on iron supplements but last hemoglobin was 14.4.  Not sure she needs to continue with iron  3. 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  Underwent thrombolytic therapy when stroke occurred  4. Depression, recurrent ([place]) No evidence of depression today but she continues on the laxative pain 75 mg  5. Essential hypertension Blood pressure is good today at 158/86 she is on amlodipine 2.5 mg  6. History of normocytic normochromic anemia As noted above she continues with iron therapy but hemoglobin has normalized    Family/ staff Communication:   Labs/tests ordered:  .[email or web address]

## 2022-08-10 DIAGNOSIS — H401421 Capsular glaucoma with pseudoexfoliation of lens, left eye, mild stage: Secondary | ICD-10-CM | POA: Diagnosis not present

## 2022-08-10 DIAGNOSIS — H04123 Dry eye syndrome of bilateral lacrimal glands: Secondary | ICD-10-CM | POA: Diagnosis not present

## 2022-08-10 DIAGNOSIS — Z961 Presence of intraocular lens: Secondary | ICD-10-CM | POA: Diagnosis not present

## 2022-08-31 DIAGNOSIS — F330 Major depressive disorder, recurrent, mild: Secondary | ICD-10-CM | POA: Diagnosis not present

## 2022-09-14 DIAGNOSIS — H401421 Capsular glaucoma with pseudoexfoliation of lens, left eye, mild stage: Secondary | ICD-10-CM | POA: Diagnosis not present

## 2022-09-24 ENCOUNTER — Non-Acute Institutional Stay: Payer: Medicare Other | Admitting: Nurse Practitioner

## 2022-09-24 ENCOUNTER — Encounter: Payer: Self-pay | Admitting: Nurse Practitioner

## 2022-09-24 DIAGNOSIS — Z0000 Encounter for general adult medical examination without abnormal findings: Secondary | ICD-10-CM | POA: Diagnosis not present

## 2022-09-24 NOTE — Progress Notes (Unsigned)
Provider: Arletha Grippe  Location: [facility] Room Number: 913-A Place of Service:  ALF ([phone])   PCP: Virgie Dad, MD Patient Care Team: Virgie Dad, MD as PCP - General (Internal Medicine) Viona Gilmore, [facility] (Inactive) as Pharmacist (Pharmacist)  Extended Emergency Contact Information Primary Emergency Contact: [place] [employer] Phone: [phone] Relation: Daughter  Code Status: Full Code Goals of Care: Advanced Directive information    03/26/2022    1:58 PM  Advanced Directives  Does Patient Have a Medical Advance Directive? Yes  Type of Paramedic of [place];Living will  Does patient want to make changes to medical advance directive? No - Patient declined  Copy of [place] in Chart? Yes - validated most recent copy scanned in chart (See row information)     Chief Complaint  Patient presents with   Medicare Wellness    AWV    HPI: Patient is a 89 y.o. female seen today for an annual comprehensive examination.  Past Medical History:  Diagnosis Date   Arthritis    Depression    Hyperlipidemia    Osteopenia    Stroke ([facility])    Thrombocytopenia ([facility])    Vitamin D deficiency    Wrist fracture 2002   left   Past Surgical History:  Procedure Laterality Date   APPENDECTOMY  1967   CARPAL TUNNEL RELEASE  2008   right   COLON RESECTION SIGMOID N/A 12/03/2020   Procedure: EXPLORATORY LAPAROTOMY; HARTMAN'S PROCEDURE; SPLEENIC FLEXURE MOBILIZATION;  Surgeon: Leighton Ruff, MD;  Location: [facility];  Service: General;  Laterality: N/A;   COLOSTOMY N/A 12/03/2020   Procedure: COLOSTOMY;  Surgeon: Leighton Ruff, MD;  Location: [facility];  Service: General;  Laterality: N/A;   JOINT REPLACEMENT  2002   right total     reports that she has never smoked. She has never used smokeless tobacco. She reports that she does not drink alcohol and does not use drugs. Social  History   Socioeconomic History   Marital status: Widowed    Spouse name: Not on file   Number of children: 4   Years of education: 16   Highest education level: Bachelor's degree (e.g., BA, AB, BS)  Occupational History   Not on file  Tobacco Use   Smoking status: Never   Smokeless tobacco: Never  Vaping Use   Vaping Use: Never used  Substance and Sexual Activity   Alcohol use: No   Drug use: No   Sexual activity: Not on file  Other Topics Concern   Not on file  Social History Narrative   L[facility] in senior apartment at [facility]   widowed   Social Determinants of Health   Financial Resource Strain: Low Risk  (10/16/2019)   Overall Financial Resource Strain (CARDIA)    Difficulty of Paying Living Expenses: Not hard at all  Food Insecurity: No Food Insecurity (10/16/2019)   Hunger Vital Sign    Worried About Running Out of Food in the Last Year: Never true    [place] in the Last Year: Never true  Transportation Needs: No Transportation Needs (10/16/2019)   PRAPARE - Hydrologist (Medical): No    Lack of Transportation (Non-Medical): No  Physical Activity: Inactive (10/16/2019)   Exercise Vital Sign    Days of Exercise per Week: 0 days    Minutes of Exercise per Session: 0 min  Stress: No  Stress Concern Present (10/16/2019)   [place]    Feeling of Stress : Only a little  Social Connections: Unknown (10/16/2019)   Social Connection and Isolation Panel [NHANES]    Frequency of Communication with Friends and Family: More than three times a week    Frequency of Social Gatherings with Friends and Family: Once a week    Attends Religious Services: Not on Advertising copywriter or Organizations: Yes    Attends Archivist Meetings: Not on file    Marital Status: Widowed   Family History  Problem Relation Age of Onset   Breast cancer Mother    Breast cancer  Sister     Pertinent  Health Maintenance Due  Topic Date Due   INFLUENZA VACCINE  Completed   DEXA SCAN  Completed      12/13/2020    3:00 PM 12/14/2020    5:00 AM 12/14/2020    8:00 AM 12/14/2020    8:29 PM 12/15/2020    8:44 AM  Fall Risk  (RETIRED) Patient Fall Risk Level Moderate fall risk Moderate fall risk Moderate fall risk Moderate fall risk High fall risk      10/11/2020   11:02 AM 08/31/2020    9:39 AM 10/16/2019   10:57 AM 07/18/2018    1:35 PM 06/17/2018    4:36 PM  Depression screen PHQ 2/9  Decreased Interest 2 0 1 0 1  Down, Depressed, Hopeless 1 0 1 0 0  PHQ - 2 Score 3 0 2 0 1  Altered sleeping 0  0 0   Tired, decreased energy 1  1 0   Change in appetite 0  0 0   Feeling bad or failure about yourself  2  0 0   Trouble concentrating 1  0 0   Moving slowly or fidgety/restless 1  0 0   Suicidal thoughts 0  0 0   PHQ-9 Score 8  3 0   Difficult doing work/chores   Not difficult at all Not difficult at all     Functional Status Survey:    Allergies  Allergen Reactions   Bactrim [Sulfamethoxazole-Trimethoprim] Nausea Only   Ditropan [Oxybutynin]     Made patient feel bad   Sulfa Antibiotics     Other reaction(s): stomach upset    Outpatient Encounter Medications as of 09/24/2022  Medication Sig   acetaminophen (TYLENOL) 325 MG tablet Take 2 tablets (650 mg total) by mouth every 6 (six) hours as needed for mild pain or moderate pain.   acetaminophen (TYLENOL) 500 MG tablet Take 1,000 mg by mouth 2 (two) times daily.   Alum & Mag Hydroxide-Simeth (ANTACID & ANTIGAS PO) Take 30 mLs by mouth every 4 (four) hours as needed. for Indigestion   amLODipine (NORVASC) 5 MG tablet Take 5 mg by mouth daily.   aspirin EC 325 MG EC tablet Take 1 tablet (325 mg total) by mouth daily.   calcium carbonate (OSCAL) 1500 (600 Ca) MG TABS tablet Take 600 mg of elemental calcium by mouth daily.   Cholecalciferol (VITAMIN D3) 2000 units capsule Take 2,000 Units by mouth daily.    diclofenac Sodium (VOLTAREN ARTHRITIS PAIN) 1 % GEL Apply 2 g topically 3 (three) times daily as needed (Left wrist).   fluticasone (FLONASE) 50 MCG/ACT nasal spray Place 1 spray into both nostrils 2 (two) times daily.   loperamide (IMODIUM A-D) 2 MG tablet Take 2 mg  by mouth as needed for diarrhea or loose stools.   Magnesium 300 MG CAPS Take 300 mg by mouth daily.   magnesium hydroxide (MILK OF MAGNESIA) 400 MG/5ML suspension Take 30 mLs by mouth daily as needed for mild constipation.   minoxidil (ROGAINE) 2 % external solution Apply topically daily as needed.   polyethylene glycol (MIRALAX / GLYCOLAX) 17 g packet Take 17 g by mouth daily.   potassium chloride SA (KLOR-CON) 20 MEQ tablet Take 20 mEq by mouth daily.   Propylene Glycol (SYSTANE BALANCE OP) Apply 1 drop to eye in the morning and at bedtime. related to PRESENCE OF INTRAOCULAR LENS (Z96.1) wait 10 minutes between Systane and timolol drops   rosuvastatin (CRESTOR) 10 MG tablet TAKE 1 TABLET ONCE DAILY.   simethicone (MYLICON) 80 MG chewable tablet Chew 80 mg by mouth 4 (four) times daily as needed.   timolol (TIMOPTIC) 0.5 % ophthalmic solution Place 1 drop into the left eye 2 (two) times daily. related to PRESENCE OF INTRAOCULAR LENS (Z96.1) Wait 10 minutes between Systane and timolol drops   venlafaxine XR (EFFEXOR-XR) 75 MG 24 hr capsule TAKE 2 CAPSULES ('150MG'$ ) BY MOUTH DAILY.   vitamin B-12 (CYANOCOBALAMIN) 1000 MCG tablet Take 1,000 mcg by mouth daily.   Biotin 10 MG TABS Take 10 mg by mouth daily.  (Patient not taking: Reported on 09/24/2022)   latanoprost (XALATAN) 0.005 % ophthalmic solution Place 1 drop into the left eye at bedtime. (Patient not taking: Reported on 09/24/2022)   Vitamin E 45 MG (100 UNIT) CAPS Take 1 capsule by mouth daily. (Patient not taking: Reported on 09/24/2022)   [DISCONTINUED] alum & mag hydroxide-simeth (MAALOX/MYLANTA) 200-200-20 MG/5ML suspension Take 30 mLs by mouth every 4 (four) hours as needed for  indigestion or heartburn.   [DISCONTINUED] amLODipine (NORVASC) 2.5 MG tablet Take 2.5 mg by mouth daily.   [DISCONTINUED] Charcoal Activated 260 MG CAPS Take 1 capsule by mouth every 6 (six) hours as needed (For gas).   [DISCONTINUED] CHELATED MAGNESIUM PO Take 300 mg by mouth daily.   [DISCONTINUED] doxycycline (VIBRAMYCIN) 100 MG capsule Take 100 mg by mouth 2 (two) times daily.   No facility-administered encounter medications on file as of 09/24/2022.    Review of Systems  Vitals:   09/24/22 1102  BP: 126/70  Pulse: 65  Resp: 19  Temp: 97.7 F (36.5 C)  SpO2: 94%  Weight: 175 lb 9.6 oz (79.7 kg)  Height: '5\' 3"'$  (1.6 m)   Body mass index is 31.11 kg/m. Physical Exam  Labs reviewed: Basic Metabolic Panel: Recent Labs    11/22/21 0000 03/20/22 0000  NA 143 142  K 3.6 3.7  CL 104 104  CO2 26* 28*  BUN 19 18  CREATININE 0.6 0.7  CALCIUM 9.4 9.5   Liver Function Tests: Recent Labs    11/22/21 0000 03/20/22 0000  AST 15 15  ALT 12 14  ALKPHOS 65 63  ALBUMIN 4.0 4.6   No results for input(s): "LIPASE", "AMYLASE" in the last 8760 hours. No results for input(s): "AMMONIA" in the last 8760 hours. CBC: Recent Labs    11/22/21 0000 04/17/22 0000  WBC 5.4  --   NEUTROABS 2,900.00 2,167.00  HGB 14.4 13.1  HCT 44 39  PLT 119* 108*   Cardiac Enzymes: No results for input(s): "CKTOTAL", "CKMB", "CKMBINDEX", "TROPONINI" in the last 8760 hours. BNP: Invalid input(s): "POCBNP" Lab Results  Component Value Date   HGBA1C 5.9 (H) 09/26/2015   Lab Results  Component Value Date   TSH 1.40 12/21/2020   Lab Results  Component Value Date   [id number] 245 12/21/2020   No results found for: "FOLATE" Lab Results  Component Value Date   IRON 22 (L) 04/22/2015   TIBC 402 04/22/2015   FERRITIN 23 04/22/2015    Imaging and Procedures obtained recently: No results found.  Assessment/Plan There are no diagnoses linked to this encounter.   Family/ staff  Communication:   Labs/tests ordered:

## 2022-09-25 ENCOUNTER — Encounter: Payer: Self-pay | Admitting: Nurse Practitioner

## 2022-09-25 NOTE — Progress Notes (Signed)
Subjective:   Sara Logan is a 89 y.o. female who presents for Medicare Annual (Subsequent) preventive examination @ [place].  Cardiac Risk Factors include: advanced age (>49mn, >>76women);dyslipidemia;obesity (BMI >30kg/m2)     Objective:    Today's Vitals   09/24/22 1102  BP: 126/70  Pulse: 65  Resp: 19  Temp: 97.7 F (36.5 C)  SpO2: 94%  Weight: 175 lb 9.6 oz (79.7 kg)  Height: '5\' 3"'$  (1.6 m)   Body mass index is 31.11 kg/m.     03/26/2022    1:58 PM 03/09/2022    2:30 PM 11/28/2021    3:29 PM 09/01/2021   11:23 AM 06/01/2021   10:43 AM 03/07/2021    3:04 PM 02/01/2021    8:46 AM  Advanced Directives  Does Patient Have a Medical Advance Directive? Yes Yes Yes Yes Yes Yes Yes  Type of AParamedicof APine GroveLiving will HIowa CityLiving will Living will;Healthcare Power of AFriendLiving will HPrince's LakesLiving will HDecaturLiving will HAnascoLiving will  Does patient want to make changes to medical advance directive? No - Patient declined No - Patient declined No - Patient declined No - Patient declined No - Patient declined No - Patient declined No - Patient declined  Copy of HUnionvillein Chart? Yes - validated most recent copy scanned in chart (See row information) Yes - validated most recent copy scanned in chart (See row information) Yes - validated most recent copy scanned in chart (See row information) Yes - validated most recent copy scanned in chart (See row information) Yes - validated most recent copy scanned in chart (See row information) Yes - validated most recent copy scanned in chart (See row information) Yes - validated most recent copy scanned in chart (See row information)    Current Medications (verified) Outpatient Encounter Medications as of 09/24/2022  Medication Sig   acetaminophen  (TYLENOL) 325 MG tablet Take 2 tablets (650 mg total) by mouth every 6 (six) hours as needed for mild pain or moderate pain.   acetaminophen (TYLENOL) 500 MG tablet Take 1,000 mg by mouth 2 (two) times daily.   Alum & Mag Hydroxide-Simeth (ANTACID & ANTIGAS PO) Take 30 mLs by mouth every 4 (four) hours as needed. for Indigestion   amLODipine (NORVASC) 5 MG tablet Take 5 mg by mouth daily.   aspirin EC 325 MG EC tablet Take 1 tablet (325 mg total) by mouth daily.   calcium carbonate (OSCAL) 1500 (600 Ca) MG TABS tablet Take 600 mg of elemental calcium by mouth daily.   Cholecalciferol (VITAMIN D3) 2000 units capsule Take 2,000 Units by mouth daily.   diclofenac Sodium (VOLTAREN ARTHRITIS PAIN) 1 % GEL Apply 2 g topically 3 (three) times daily as needed (Left wrist).   fluticasone (FLONASE) 50 MCG/ACT nasal spray Place 1 spray into both nostrils 2 (two) times daily.   loperamide (IMODIUM A-D) 2 MG tablet Take 2 mg by mouth as needed for diarrhea or loose stools.   Magnesium 300 MG CAPS Take 300 mg by mouth daily.   magnesium hydroxide (MILK OF MAGNESIA) 400 MG/5ML suspension Take 30 mLs by mouth daily as needed for mild constipation.   minoxidil (ROGAINE) 2 % external solution Apply topically daily as needed.   polyethylene glycol (MIRALAX / GLYCOLAX) 17 g packet Take 17 g by mouth daily.   potassium chloride SA (KLOR-CON) 20  MEQ tablet Take 20 mEq by mouth daily.   Propylene Glycol (SYSTANE BALANCE OP) Apply 1 drop to eye in the morning and at bedtime. related to PRESENCE OF INTRAOCULAR LENS (Z96.1) wait 10 minutes between Systane and timolol drops   rosuvastatin (CRESTOR) 10 MG tablet TAKE 1 TABLET ONCE DAILY.   simethicone (MYLICON) 80 MG chewable tablet Chew 80 mg by mouth 4 (four) times daily as needed.   timolol (TIMOPTIC) 0.5 % ophthalmic solution Place 1 drop into the left eye 2 (two) times daily. related to PRESENCE OF INTRAOCULAR LENS (Z96.1) Wait 10 minutes between Systane and timolol  drops   venlafaxine XR (EFFEXOR-XR) 75 MG 24 hr capsule TAKE 2 CAPSULES ('150MG'$ ) BY MOUTH DAILY.   vitamin B-12 (CYANOCOBALAMIN) 1000 MCG tablet Take 1,000 mcg by mouth daily.   Biotin 10 MG TABS Take 10 mg by mouth daily.  (Patient not taking: Reported on 09/24/2022)   latanoprost (XALATAN) 0.005 % ophthalmic solution Place 1 drop into the left eye at bedtime. (Patient not taking: Reported on 09/24/2022)   Vitamin E 45 MG (100 UNIT) CAPS Take 1 capsule by mouth daily. (Patient not taking: Reported on 09/24/2022)   [DISCONTINUED] alum & mag hydroxide-simeth (MAALOX/MYLANTA) 200-200-20 MG/5ML suspension Take 30 mLs by mouth every 4 (four) hours as needed for indigestion or heartburn.   [DISCONTINUED] amLODipine (NORVASC) 2.5 MG tablet Take 2.5 mg by mouth daily.   [DISCONTINUED] Charcoal Activated 260 MG CAPS Take 1 capsule by mouth every 6 (six) hours as needed (For gas).   [DISCONTINUED] CHELATED MAGNESIUM PO Take 300 mg by mouth daily.   [DISCONTINUED] doxycycline (VIBRAMYCIN) 100 MG capsule Take 100 mg by mouth 2 (two) times daily.   No facility-administered encounter medications on file as of 09/24/2022.    Allergies (verified) Bactrim [sulfamethoxazole-trimethoprim], Ditropan [oxybutynin], and Sulfa antibiotics   History: Past Medical History:  Diagnosis Date   Arthritis    Depression    Hyperlipidemia    Osteopenia    Stroke ([place])    Thrombocytopenia ([facility])    Vitamin D deficiency    Wrist fracture 2002   left   Past Surgical History:  Procedure Laterality Date   APPENDECTOMY  1967   CARPAL TUNNEL RELEASE  2008   right   COLON RESECTION SIGMOID N/A 12/03/2020   Procedure: EXPLORATORY LAPAROTOMY; HARTMAN'S PROCEDURE; SPLEENIC FLEXURE MOBILIZATION;  Surgeon: Leighton Ruff, MD;  Location: [facility];  Service: General;  Laterality: N/A;   COLOSTOMY N/A 12/03/2020   Procedure: COLOSTOMY;  Surgeon: Leighton Ruff, MD;  Location: [facility];  Service: General;  Laterality: N/A;   JOINT  REPLACEMENT  2002   right total    Family History  Problem Relation Age of Onset   Breast cancer Mother    Breast cancer Sister    Social History   Socioeconomic History   Marital status: Widowed    Spouse name: Not on file   Number of children: 4   Years of education: 16   Highest education level: Bachelor's degree (e.g., BA, AB, BS)  Occupational History   Not on file  Tobacco Use   Smoking status: Never   Smokeless tobacco: Never  Vaping Use   Vaping Use: Never used  Substance and Sexual Activity   Alcohol use: No   Drug use: No   Sexual activity: Not on file  Other Topics Concern   Not on file  Social History Narrative   Lives in senior apartment at [facility]   widowed  Social Determinants of Health   Financial Resource Strain: Low Risk  (10/16/2019)   Overall Financial Resource Strain (CARDIA)    Difficulty of Paying Living Expenses: Not hard at all  Food Insecurity: No Food Insecurity (10/16/2019)   Hunger Vital Sign    Worried About Running Out of Food in the Last Year: Never true    Ran Out of Food in the Last Year: Never true  Transportation Needs: No Transportation Needs (10/16/2019)   PRAPARE - Hydrologist (Medical): No    Lack of Transportation (Non-Medical): No  Physical Activity: Inactive (10/16/2019)   Exercise Vital Sign    Days of Exercise per Week: 0 days    Minutes of Exercise per Session: 0 min  Stress: No Stress Concern Present (10/16/2019)   [place]    Feeling of Stress : Only a little  Social Connections: Unknown (10/16/2019)   Social Connection and Isolation Panel [NHANES]    Frequency of Communication with Friends and Family: More than three times a week    Frequency of Social Gatherings with Friends and Family: Once a week    Attends Religious Services: Not on Advertising copywriter or Organizations: Yes    Attends Archivist  Meetings: Not on file    Marital Status: Widowed    Tobacco Counseling Counseling given: Not Answered   Clinical Intake:  Pre-visit preparation completed: Yes  Pain : No/denies pain     BMI - recorded: 31.11 Nutritional Status: BMI > 30  Obese Nutritional Risks: None Diabetes: No  How often do you need to have someone help you when you read instructions, pamphlets, or other written materials from your doctor or pharmacy?: 2 - Rarely What is the last grade level you completed in school?: college  Diabetic?no  Interpreter Needed?: No  Information entered by :: Victoria Henshaw Bretta Bang NP   Activities of Daily Living    09/25/2022   12:12 PM  In your present state of health, do you have any difficulty performing the following activities:  Hearing? 0  Vision? 0  Difficulty concentrating or making decisions? 0  Walking or climbing stairs? 1  Dressing or bathing? 1  Doing errands, shopping? 1  Preparing Food and eating ? N  Using the Toilet? N  In the past six months, have you accidently leaked urine? Y  Do you have problems with loss of bowel control? Y  Managing your Medications? Y  Managing your Finances? Y  Housekeeping or managing your Housekeeping? Y    Patient Care Team: Virgie Dad, MD as PCP - General (Internal Medicine) Viona Gilmore, [facility] (Inactive) as Pharmacist (Pharmacist)  Indicate any recent Medical Services you may have received from other than [facility] providers in the past year (date may be approximate).     Assessment:   This is a routine wellness examination for Sara Logan.  Hearing/Vision screen No results found.  Dietary issues and exercise activities discussed: Current Exercise Habits: The patient does not participate in regular exercise at present, Exercise limited by: orthopedic condition(s)   Goals Addressed             This Visit's Progress    Maintain Mobility and Function       Evidence-based guidance:  Acknowledge and validate  impact of pain, loss of strength and potential disfigurement (hand osteoarthritis) on mental health and daily life, such as social isolation, anxiety,  depression, impaired sexual relationship and   injury from falls.  Anticipate referral to physical or occupational therapy for assessment, therapeutic exercise and recommendation for adaptive equipment or assistive devices; encourage participation.  Assess impact on ability to perform activities of daily living, as well as engage in sports and leisure events or requirements of work or school.  Provide anticipatory guidance and reassurance about the benefit of exercise to maintain function; acknowledge and normalize fear that exercise may worsen symptoms.  Encourage regular exercise, at least 10 minutes at a time for 45 minutes per week; consider yoga, water exercise and proprioceptive exercises; encourage use of wearable activity tracker to increase motivation and adherence.  Encourage maintenance or resumption of daily activities, including employment, as pain allows and with minimal exposure to trauma.  Assist patient to advocate for adaptations to the work environment.  Consider level of pain and function, gender, age, lifestyle, patient preference, quality of life, readiness and ?ocapacity to benefit? when recommending patients for orthopaedic surgery consultation.  Explore strategies, such as changes to medication regimen or activity that enables patient to anticipate and manage flare-ups that increase deconditioning and disability.  Explore patient preferences; encourage exposure to a broader range of activities that have been avoided for fear of experiencing pain.  Identify barriers to participation in therapy or exercise, such as pain with activity, anticipated or imagined pain.  Monitor postoperative joint replacement or any preexisting joint replacement for ongoing pain and loss of function; provide social support and encouragement throughout  recovery.   Notes:        Depression Screen    10/11/2020   11:02 AM 08/31/2020    9:39 AM 10/16/2019   10:57 AM 07/18/2018    1:35 PM 06/17/2018    4:36 PM 06/13/2017    4:52 PM 06/08/2016   11:31 AM  PHQ 2/9 Scores  PHQ - 2 Score 3 0 2 0 1 0 2  PHQ- 9 Score 8  3 0   4    Fall Risk    08/31/2020    9:39 AM 10/16/2019   10:57 AM 08/04/2019    9:47 AM 07/31/2018    9:29 AM 06/17/2018    4:36 PM  Fall Risk   Falls in the past year? 1 0 0 0 No  Comment   Emmi Telephone Survey: data to providers prior to load Emmi Telephone Survey: data to providers prior to load   Number falls in past yr: 1      Injury with Fall? 1      Comment September 2021 head injury minor      Risk for fall due to :  Medication side effect     Follow up  Falls evaluation completed;Education provided;Falls prevention discussed       FALL RISK PREVENTION PERTAINING TO THE HOME:  Any stairs in or around the home? Yes  If so, are there any without handrails? No  Home free of loose throw rugs in walkways, pet beds, electrical cords, etc? Yes  Adequate lighting in your home to reduce risk of falls? Yes   ASSISTIVE DEVICES UTILIZED TO PREVENT FALLS:  Life alert? No  Use of a cane, walker or w/c? Yes  Grab bars in the bathroom? Yes  Shower chair or bench in shower? Yes  Elevated toilet seat or a handicapped toilet? Yes   TIMED UP AND GO:  Was the test performed? Yes .  Length of time to ambulate 10 feet: 12 sec.   Gait  steady and fast with assistive device  Cognitive Function:    09/12/2021    4:25 PM  MMSE - Mini Mental State Exam  Orientation to time 4  Orientation to Place 5  Registration 3  Attention/ Calculation 5  Recall 3  Language- name 2 objects 2  Language- repeat 1  Language- follow 3 step command 3  Language- read & follow direction 1  Write a sentence 1        Immunizations Immunization History  Administered Date(s) Administered   Fluad Quad(high Dose 65+) 06/13/2019,  07/02/2022   Influenza Split 06/18/2012   Influenza Whole 07/11/2010   Influenza, High Dose Seasonal PF 07/07/2013, 07/14/2014, 06/24/2015, 06/13/2016, 06/04/2017, 06/17/2018   Influenza-Unspecified 06/22/2020, 06/29/2021   Moderna Covid-19 Vaccine Bivalent Booster 41yr & up 01/26/2022   Moderna SARS-COV2 Booster Vaccination 07/19/2020, 02/07/2021   Moderna Sars-Covid-2 Vaccination 09/14/2019, 10/12/2019   PFIZER(Purple Top)SARS-COV-2 Vaccination 05/30/2021   Pfizer Covid-19 Vaccine Bivalent Booster 178yr& up 07/12/2022   Pneumococcal Conjugate-13 04/27/2014, 08/22/2021   Pneumococcal Polysaccharide-23 10/15/2017   Td 09/10/2005   Zoster Recombinat (Shingrix) 08/22/2021, 10/25/2021, 02/28/2022   Zoster, Live 11/29/2006    TDAP status: Due, Education has been provided regarding the importance of this vaccine. Advised may receive this vaccine at local pharmacy or Health Dept. Aware to provide a copy of the vaccination record if obtained from local pharmacy or Health Dept. Verbalized acceptance and understanding.  Flu Vaccine status: Up to date  Pneumococcal vaccine status: Up to date  Covid-19 vaccine status: Information provided on how to obtain vaccines.   Qualifies for Shingles Vaccine? Yes   Zostavax completed Yes   Shingrix Completed?: Yes  Screening Tests Health Maintenance  Topic Date Due   DTaP/Tdap/Td (2 - Tdap) 09/11/2015   COVID-19 Vaccine (6 - 2023-24 season) 09/06/2022   Medicare Annual Wellness (AWV)  09/25/2023   Pneumonia Vaccine 89Years old  Completed   INFLUENZA VACCINE  Completed   DEXA SCAN  Completed   Zoster Vaccines- Shingrix  Completed   HPV VACCINES  Aged Out    Health Maintenance  Health Maintenance Due  Topic Date Due   DTaP/Tdap/Td (2 - Tdap) 09/11/2015   COVID-19 Vaccine (6 - 2023-24 season) 09/06/2022    Colorectal cancer screening: No longer required.   Mammogram status: No longer required due to aged out.  Bone Density status:  Completed 09/07/21. Results reflect: Bone density results: OSTEOPENIA. Repeat every 2-5 years.  Lung Cancer Screening: (Low Dose CT Chest recommended if Age 87-80ears, 30 pack-year currently smoking OR have quit w/in 15years.) does not qualify.    Additional Screening:  Hepatitis C Screening: does not qualify;   Vision Screening: Recommended annual ophthalmology exams for early detection of glaucoma and other disorders of the eye. Is the patient up to date with their annual eye exam?  Yes  Who is the provider or what is the name of the office in which the patient attends annual eye exams? 09/14/22 Nadia Hersel If pt is not established with a provider, would they like to be referred to a provider to establish care? No .   Dental Screening: Recommended annual dental exams for proper oral hygiene  Community Resource Referral / Chronic Care Management: CRR required this visit?  No   CCM required this visit?  No      Plan:    Due Tdap I have personally reviewed and noted the following in the patient's chart:   Medical and social history Use of  alcohol, tobacco or illicit drugs  Current medications and supplements including opioid prescriptions. Patient is not currently taking opioid prescriptions. Functional ability and status Nutritional status Physical activity Advanced directives List of other physicians Hospitalizations, surgeries, and ER visits in previous 12 months Vitals Screenings to include cognitive, depression, and falls Referrals and appointments  In addition, I have reviewed and discussed with patient certain preventive protocols, quality metrics, and best practice recommendations. A written personalized care plan for preventive services as well as general preventive health recommendations were provided to patient.     Zidan Helget X [place], NP   09/25/2022

## 2022-09-27 ENCOUNTER — Ambulatory Visit
Admission: RE | Admit: 2022-09-27 | Discharge: 2022-09-27 | Disposition: A | Discharge status: Home / Self Care | Payer: Medicare Other | Source: Ambulatory Visit | Attending: Internal Medicine | Admitting: Internal Medicine

## 2022-09-27 DIAGNOSIS — Z1231 Encounter for screening mammogram for malignant neoplasm of breast: Secondary | ICD-10-CM

## 2022-10-02 ENCOUNTER — Encounter: Payer: Self-pay | Admitting: Nurse Practitioner

## 2022-10-02 ENCOUNTER — Non-Acute Institutional Stay: Payer: Medicare Other | Admitting: Nurse Practitioner

## 2022-10-02 DIAGNOSIS — F339 Major depressive disorder, recurrent, unspecified: Secondary | ICD-10-CM | POA: Diagnosis not present

## 2022-10-02 DIAGNOSIS — F330 Major depressive disorder, recurrent, mild: Secondary | ICD-10-CM | POA: Diagnosis not present

## 2022-10-02 DIAGNOSIS — K5901 Slow transit constipation: Secondary | ICD-10-CM | POA: Diagnosis not present

## 2022-10-02 DIAGNOSIS — D500 Iron deficiency anemia secondary to blood loss (chronic): Secondary | ICD-10-CM | POA: Diagnosis not present

## 2022-10-02 DIAGNOSIS — I10 Essential (primary) hypertension: Secondary | ICD-10-CM

## 2022-10-02 DIAGNOSIS — Z933 Colostomy status: Secondary | ICD-10-CM

## 2022-10-02 DIAGNOSIS — E538 Deficiency of other specified B group vitamins: Secondary | ICD-10-CM | POA: Diagnosis not present

## 2022-10-02 DIAGNOSIS — E785 Hyperlipidemia, unspecified: Secondary | ICD-10-CM

## 2022-10-02 NOTE — Assessment & Plan Note (Signed)
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

## 2022-10-02 NOTE — Assessment & Plan Note (Signed)
takes Rosuvastatin, LDL 72 12/20/20

## 2022-10-02 NOTE — Assessment & Plan Note (Signed)
Her mood is stable,  takes Venlafaxine

## 2022-10-02 NOTE — Assessment & Plan Note (Signed)
takes Vit B, Vit B12 942

## 2022-10-02 NOTE — Progress Notes (Signed)
Location:  [place] Room Number: AL/913/A Place of Service:  ALF (13) Provider:  Hydeia Mcatee X, NP   Patient Care Team: Virgie Dad, MD as PCP - General (Internal Medicine) Viona Gilmore, [facility] (Inactive) as Pharmacist (Pharmacist)  Extended Emergency Contact Information Primary Emergency Contact: [place] of [employer] Phone: [phone] Relation: Daughter  Code Status:  FULL Goals of care: Advanced Directive information    10/02/2022    9:38 AM  Advanced Directives  Does Patient Have a Medical Advance Directive? No  Type of Advance Directive Healthcare Power of Attorney  Does patient want to make changes to medical advance directive? No - Patient declined  Copy of [place] in Chart? Yes - validated most recent copy scanned in chart (See row information)     Chief Complaint  Patient presents with   Medical Management of Chronic Issues    Patient is here for a follow up for chronic conditions    Immunizations    Patient is due for updated Tdap    HPI:  Pt is a 89 y.o. female seen today for medical management of chronic diseases.      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, s/p Hartman's procedure, splenic flexure mobilization             HTN takes Amlodipine Bun/creat 18/0.7 03/20/22             Hypomagnesemia, Mg 1.8 12/20/20             Vit B12 deficiency, takes Vit B, Vit B12 942             Hx of CVA w/o residual deficit             Hyperlipidemia, takes Rosuvastatin, LDL 72 12/20/20             Depression, takes Venlafaxine             Constipation, takes MiraLax Colace             Post op anemia, off Fe Hgb 13.1 04/17/22             Hypokalemia, supplemented, takes Kcl, serum K 3.7 03/20/22             Allergic rhinitis, takes Flonase              OA, pain is controlled, takes Tylenol.    Past Medical History:  Diagnosis Date   Arthritis    Depression     Hyperlipidemia    Osteopenia    Stroke ([place])    Thrombocytopenia (HCC)    Vitamin D deficiency    Wrist fracture 2002   left   Past Surgical History:  Procedure Laterality Date   APPENDECTOMY  1967   CARPAL TUNNEL RELEASE  2008   right   COLON RESECTION SIGMOID N/A 12/03/2020   Procedure: EXPLORATORY LAPAROTOMY; HARTMAN'S PROCEDURE; SPLEENIC FLEXURE MOBILIZATION;  Surgeon: Leighton Ruff, MD;  Location: [facility];  Service: General;  Laterality: N/A;   COLOSTOMY N/A 12/03/2020   Procedure: COLOSTOMY;  Surgeon: Leighton Ruff, MD;  Location: [facility];  Service: General;  Laterality: N/A;   JOINT REPLACEMENT  2002   right total     Allergies  Allergen Reactions   Bactrim [Sulfamethoxazole-Trimethoprim] Nausea Only   Ditropan [Oxybutynin]     Made patient feel bad   Sulfa Antibiotics     Other reaction(s): stomach upset  Outpatient Encounter Medications as of 10/02/2022  Medication Sig   acetaminophen (TYLENOL) 325 MG tablet Take 2 tablets (650 mg total) by mouth every 6 (six) hours as needed for mild pain or moderate pain.   acetaminophen (TYLENOL) 500 MG tablet Take 1,000 mg by mouth 2 (two) times daily.   Alum & Mag Hydroxide-Simeth (ANTACID & ANTIGAS PO) Take 30 mLs by mouth every 4 (four) hours as needed. for Indigestion   amLODipine (NORVASC) 5 MG tablet Take 5 mg by mouth daily.   calcium carbonate (OSCAL) 1500 (600 Ca) MG TABS tablet Take 600 mg of elemental calcium by mouth daily.   Cholecalciferol (VITAMIN D3) 2000 units capsule Take 2,000 Units by mouth daily.   diclofenac Sodium (VOLTAREN ARTHRITIS PAIN) 1 % GEL Apply 2 g topically 3 (three) times daily as needed (Left wrist).   fluticasone (FLONASE) 50 MCG/ACT nasal spray Place 1 spray into both nostrils 2 (two) times daily.   loperamide (IMODIUM A-D) 2 MG tablet Take 2 mg by mouth as needed for diarrhea or loose stools.   Magnesium 300 MG CAPS Take 300 mg by mouth daily.   magnesium hydroxide (MILK OF MAGNESIA) 400  MG/5ML suspension Take 30 mLs by mouth daily as needed for mild constipation.   minoxidil (ROGAINE) 2 % external solution Apply topically daily as needed.   polyethylene glycol (MIRALAX / GLYCOLAX) 17 g packet Take 17 g by mouth daily.   potassium chloride SA (KLOR-CON) 20 MEQ tablet Take 20 mEq by mouth daily.   Propylene Glycol (SYSTANE BALANCE OP) Apply 1 drop to eye in the morning and at bedtime. related to PRESENCE OF INTRAOCULAR LENS (Z96.1) wait 10 minutes between Systane and timolol drops   rosuvastatin (CRESTOR) 10 MG tablet TAKE 1 TABLET ONCE DAILY.   simethicone (MYLICON) 80 MG chewable tablet Chew 80 mg by mouth 4 (four) times daily as needed.   timolol (TIMOPTIC) 0.5 % ophthalmic solution Place 1 drop into the left eye 2 (two) times daily. related to PRESENCE OF INTRAOCULAR LENS (Z96.1) Wait 10 minutes between Systane and timolol drops   venlafaxine XR (EFFEXOR-XR) 75 MG 24 hr capsule TAKE 2 CAPSULES ('150MG'$ ) BY MOUTH DAILY.   vitamin B-12 (CYANOCOBALAMIN) 1000 MCG tablet Take 1,000 mcg by mouth daily.   [DISCONTINUED] aspirin EC 325 MG EC tablet Take 1 tablet (325 mg total) by mouth daily.   [DISCONTINUED] Biotin 10 MG TABS Take 10 mg by mouth daily.  (Patient not taking: Reported on 09/24/2022)   [DISCONTINUED] latanoprost (XALATAN) 0.005 % ophthalmic solution Place 1 drop into the left eye at bedtime. (Patient not taking: Reported on 09/24/2022)   [DISCONTINUED] Vitamin E 45 MG (100 UNIT) CAPS Take 1 capsule by mouth daily. (Patient not taking: Reported on 09/24/2022)   No facility-administered encounter medications on file as of 10/02/2022.    Review of Systems  Constitutional:  Negative for appetite change, fatigue and fever.  HENT:  Negative for congestion, hearing loss and rhinorrhea.   Eyes:  Negative for visual disturbance.  Respiratory:  Negative for cough, shortness of breath and wheezing.   Cardiovascular:  Positive for leg swelling.  Gastrointestinal:  Negative for  abdominal pain and constipation.  Genitourinary:  Positive for frequency. Negative for dysuria and urgency.  Musculoskeletal:  Positive for arthralgias and gait problem.       Left knee pain is chronic  Skin:  Negative for color change.  Neurological:  Negative for speech difficulty, weakness and light-headedness.  Psychiatric/Behavioral:  Negative for  confusion and sleep disturbance. The patient is not nervous/anxious.     Immunization History  Administered Date(s) Administered   Fluad Quad(high Dose 65+) 06/13/2019, 07/02/2022   Influenza Split 06/18/2012   Influenza Whole 07/11/2010   Influenza, High Dose Seasonal PF 07/07/2013, 07/14/2014, 06/24/2015, 06/13/2016, 06/04/2017, 06/17/2018   Influenza-Unspecified 06/22/2020, 06/29/2021   Moderna Covid-19 Vaccine Bivalent Booster 20yr & up 01/26/2022   Moderna SARS-COV2 Booster Vaccination 07/19/2020, 02/07/2021   Moderna Sars-Covid-2 Vaccination 09/14/2019, 10/12/2019   PFIZER(Purple Top)SARS-COV-2 Vaccination 05/30/2021   Pfizer Covid-19 Vaccine Bivalent Booster 173yr& up 07/12/2022   Pneumococcal Conjugate-13 04/27/2014, 08/22/2021   Pneumococcal Polysaccharide-23 10/15/2017   Td 09/10/2005   Zoster Recombinat (Shingrix) 08/22/2021, 10/25/2021, 02/28/2022   Zoster, Live 11/29/2006   Pertinent  Health Maintenance Due  Topic Date Due   INFLUENZA VACCINE  Completed   DEXA SCAN  Completed      12/14/2020    5:00 AM 12/14/2020    8:00 AM 12/14/2020    8:29 PM 12/15/2020    8:44 AM 10/02/2022    9:39 AM  Fall Risk  Falls in the past year?     0  Was there an injury with Fall?     0  Fall Risk Category Calculator     0  (RETIRED) Patient Fall Risk Level Moderate fall risk Moderate fall risk Moderate fall risk High fall risk   Patient at Risk for Falls Due to     No Fall Risks  Fall risk Follow up     Falls evaluation completed   Functional Status Survey:    Vitals:   10/02/22 0945  BP: (!) 145/75  Pulse: 85  Resp: 16  Temp:  98.1 F (36.7 C)  SpO2: 96%  Weight: 175 lb 9.6 oz (79.7 kg)  Height: '5\' 3"'$  (1.6 m)   Body mass index is 31.11 kg/m. Physical Exam Vitals and nursing note reviewed.  Constitutional:      Appearance: Normal appearance.  HENT:     Head: Normocephalic and atraumatic.     Nose: No congestion or rhinorrhea.     Mouth/Throat:     Mouth: Mucous membranes are moist.  Eyes:     Extraocular Movements: Extraocular movements intact.     Conjunctiva/sclera: Conjunctivae normal.     Pupils: Pupils are equal, round, and reactive to light.  Cardiovascular:     Rate and Rhythm: Normal rate and regular rhythm.     Heart sounds: No murmur heard. Pulmonary:     Effort: Pulmonary effort is normal.     Breath sounds: No rales.  Abdominal:     General: Bowel sounds are normal.     Palpations: Abdomen is soft.     Tenderness: There is no abdominal tenderness.     Comments: Colostomy  Musculoskeletal:        General: No tenderness.     Cervical back: Normal range of motion and neck supple.     Right lower leg: Edema present.     Left lower leg: Edema present.     Comments: Trace edema in ankles.   Skin:    General: Skin is warm and dry.     Comments: Mid abd surgical scar. Colostomy is functioning.   Neurological:     General: No focal deficit present.     Mental Status: She is alert and oriented to person, place, and time. Mental status is at baseline.     Gait: Gait abnormal.  Psychiatric:  Mood and Affect: Mood normal.        Behavior: Behavior normal.        Thought Content: Thought content normal.        Judgment: Judgment normal.     Labs reviewed: Recent Labs    11/22/21 0000 03/20/22 0000  NA 143 142  K 3.6 3.7  CL 104 104  CO2 26* 28*  BUN 19 18  CREATININE 0.6 0.7  CALCIUM 9.4 9.5   Recent Labs    11/22/21 0000 03/20/22 0000  AST 15 15  ALT 12 14  ALKPHOS 65 63  ALBUMIN 4.0 4.6   Recent Labs    11/22/21 0000 04/17/22 0000  WBC 5.4  --   NEUTROABS  2,900.00 2,167.00  HGB 14.4 13.1  HCT 44 39  PLT 119* 108*   Lab Results  Component Value Date   TSH 1.40 12/21/2020   Lab Results  Component Value Date   HGBA1C 5.9 (H) 09/26/2015   Lab Results  Component Value Date   CHOL 136 12/21/2020   HDL 93 (A) 12/21/2020   LDLCALC 72 12/21/2020   LDLDIRECT 147.0 11/17/2012   TRIG 119 12/21/2020   CHOLHDL 3 02/10/2020    Significant Diagnostic Results in last 30 days:  MM 3D SCREEN BREAST BILATERAL  Result Date: 09/28/2022 CLINICAL DATA:  Screening. EXAM: DIGITAL SCREENING BILATERAL MAMMOGRAM WITH TOMOSYNTHESIS AND CAD TECHNIQUE: Bilateral screening digital craniocaudal and mediolateral oblique mammograms were obtained. Bilateral screening digital breast tomosynthesis was performed. The images were evaluated with computer-aided detection. COMPARISON:  Previous exam(s). ACR Breast Density Category b: There are scattered areas of fibroglandular density. FINDINGS: There are no findings suspicious for malignancy. IMPRESSION: No mammographic evidence of malignancy. A result letter of this screening mammogram will be mailed directly to the patient. RECOMMENDATION: Screening mammogram in one year. (Code:SM-B-01Y) BI-RADS CATEGORY  1: Negative. Electronically Signed   By: Abelardo Diesel M.D.   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    Labs/tests ordered:  none  Time spend 40 minutes.

## 2022-10-02 NOTE — Assessment & Plan Note (Signed)
Stable,  takes MiraLax Colace

## 2022-10-02 NOTE — Assessment & Plan Note (Signed)
Blood pressure is controlled, takes Amlodipine Bun/creat 18/0.7 03/20/22

## 2022-10-02 NOTE — Assessment & Plan Note (Signed)
off Fe Hgb 13.1 04/17/22

## 2022-10-30 DIAGNOSIS — F330 Major depressive disorder, recurrent, mild: Secondary | ICD-10-CM | POA: Diagnosis not present

## 2022-12-14 DIAGNOSIS — H524 Presbyopia: Secondary | ICD-10-CM | POA: Diagnosis not present

## 2022-12-14 DIAGNOSIS — H52203 Unspecified astigmatism, bilateral: Secondary | ICD-10-CM | POA: Diagnosis not present

## 2022-12-14 DIAGNOSIS — H401421 Capsular glaucoma with pseudoexfoliation of lens, left eye, mild stage: Secondary | ICD-10-CM | POA: Diagnosis not present

## 2022-12-18 DIAGNOSIS — L821 Other seborrheic keratosis: Secondary | ICD-10-CM | POA: Diagnosis not present

## 2022-12-18 DIAGNOSIS — L814 Other melanin hyperpigmentation: Secondary | ICD-10-CM | POA: Diagnosis not present

## 2022-12-18 DIAGNOSIS — L570 Actinic keratosis: Secondary | ICD-10-CM | POA: Diagnosis not present

## 2022-12-31 ENCOUNTER — Non-Acute Institutional Stay (SKILLED_NURSING_FACILITY): Payer: Medicare Other | Admitting: Adult Health

## 2022-12-31 ENCOUNTER — Encounter: Payer: Self-pay | Admitting: Adult Health

## 2022-12-31 DIAGNOSIS — N3281 Overactive bladder: Secondary | ICD-10-CM

## 2022-12-31 NOTE — Progress Notes (Signed)
Location:  [facility] Room Number: 913A Place of Service:  SNF (31) Provider:  Catherine Oak C. Grayce Sessions, NP   Patient Care Team: Mahlon Gammon, MD as PCP - General (Internal Medicine) Verner Chol, [facility] (Inactive) as Pharmacist (Pharmacist)  Extended Emergency Contact Information Primary Emergency Contact: Julio Sicks [place] [employer] Phone: [phone] Relation: Daughter  Code Status: Full Code Goals of care: Advanced Directive information    12/31/2022   12:02 PM  Advanced Directives  Does Patient Have a Medical Advance Directive? Yes  Type of Estate agent of [place];Living will  Does patient want to make changes to medical advance directive? No - Patient declined  Copy of Healthcare Power of Attorney in Chart? Yes - validated most recent copy scanned in chart (See row information)     Chief Complaint  Patient presents with   Acute Visit    urinary incontinence    HPI:  Pt is a 89 y.o. female seen today for an acute visit for urinary incontinence. She has a friend that recently started on a medication for urinary incontinence so she wants to try it as well. She stated that she gets up several time during the night. She denies painful urination nor hematuria.   Past Medical History:  Diagnosis Date   Arthritis    Depression    Hyperlipidemia    Osteopenia    Stroke    Thrombocytopenia    Vitamin D deficiency    Wrist fracture 2002   left   Past Surgical History:  Procedure Laterality Date   APPENDECTOMY  1967   CARPAL TUNNEL RELEASE  2008   right   COLON RESECTION SIGMOID N/A 12/03/2020   Procedure: EXPLORATORY LAPAROTOMY; HARTMAN'S PROCEDURE; SPLEENIC FLEXURE MOBILIZATION;  Surgeon: Romie Levee, MD;  Location: [facility];  Service: General;  Laterality: N/A;   COLOSTOMY N/A 12/03/2020   Procedure: COLOSTOMY;  Surgeon: Romie Levee, MD;  Location: [facility];  Service: General;  Laterality:  N/A;   JOINT REPLACEMENT  2002   right total     Allergies  Allergen Reactions   Bactrim [Sulfamethoxazole-Trimethoprim] Nausea Only   Ditropan [Oxybutynin]     Made patient feel bad   Sulfa Antibiotics     Other reaction(s): stomach upset    Outpatient Encounter Medications as of 12/31/2022  Medication Sig   acetaminophen (TYLENOL) 325 MG tablet Take 2 tablets (650 mg total) by mouth every 6 (six) hours as needed for mild pain or moderate pain.   acetaminophen (TYLENOL) 500 MG tablet Take 1,000 mg by mouth 2 (two) times daily.   Alum & Mag Hydroxide-Simeth (ANTACID & ANTIGAS PO) Take 30 mLs by mouth every 4 (four) hours as needed. for Indigestion   amLODipine (NORVASC) 5 MG tablet Take 5 mg by mouth daily.   amoxicillin (AMOXIL) 500 MG tablet Take 500 mg by mouth once. capsule by mouth one time only for dental procedure for 1 Day 1 hour before dental appointment   aspirin EC 81 MG tablet Take 81 mg by mouth daily. Swallow whole.   calcium carbonate (OSCAL) 1500 (600 Ca) MG TABS tablet Take 600 mg of elemental calcium by mouth daily.   Cholecalciferol (VITAMIN D3) 2000 units capsule Take 2,000 Units by mouth daily.   diclofenac Sodium (VOLTAREN ARTHRITIS PAIN) 1 % GEL Apply 2 g topically 3 (three) times daily as needed (Left wrist).   fluticasone (FLONASE) 50 MCG/ACT nasal spray Place 1 spray into both nostrils  2 (two) times daily.   loperamide (IMODIUM A-D) 2 MG tablet Take 2 mg by mouth as needed for diarrhea or loose stools.   Magnesium 300 MG CAPS Take 300 mg by mouth daily.   magnesium hydroxide (MILK OF MAGNESIA) 400 MG/5ML suspension Take 30 mLs by mouth daily as needed for mild constipation.   minoxidil (ROGAINE) 2 % external solution Apply topically daily as needed.   polyethylene glycol (MIRALAX / GLYCOLAX) 17 g packet Take 17 g by mouth daily.   potassium chloride SA (KLOR-CON) 20 MEQ tablet Take 20 mEq by mouth daily.   Propylene Glycol (SYSTANE BALANCE OP) Apply 1 drop to  eye in the morning and at bedtime. related to PRESENCE OF INTRAOCULAR LENS (Z96.1) wait 10 minutes between Systane and timolol drops   rosuvastatin (CRESTOR) 10 MG tablet TAKE 1 TABLET ONCE DAILY.   simethicone (MYLICON) 80 MG chewable tablet Chew 80 mg by mouth 4 (four) times daily as needed.   timolol (TIMOPTIC) 0.5 % ophthalmic solution Place 1 drop into the left eye 2 (two) times daily. related to PRESENCE OF INTRAOCULAR LENS (Z96.1) Wait 10 minutes between Systane and timolol drops   venlafaxine XR (EFFEXOR-XR) 75 MG 24 hr capsule TAKE 2 CAPSULES (150MG ) BY MOUTH DAILY.   vitamin B-12 (CYANOCOBALAMIN) 1000 MCG tablet Take 1,000 mcg by mouth daily.   No facility-administered encounter medications on file as of 12/31/2022.    Review of Systems  Constitutional:  Negative for appetite change, chills, fatigue and fever.  HENT:  Negative for congestion, hearing loss, rhinorrhea and sore throat.   Eyes: Negative.   Respiratory:  Negative for cough, shortness of breath and wheezing.   Cardiovascular:  Negative for chest pain, palpitations and leg swelling.  Gastrointestinal:  Negative for abdominal pain, constipation, diarrhea, nausea and vomiting.  Genitourinary:  Positive for frequency. Negative for dysuria and urgency.  Musculoskeletal:  Negative for arthralgias, back pain and myalgias.  Skin:  Negative for color change, rash and wound.  Neurological:  Negative for dizziness, weakness and headaches.  Psychiatric/Behavioral:  Negative for behavioral problems. The patient is not nervous/anxious.     Immunization History  Administered Date(s) Administered   Fluad Quad(high Dose 65+) 06/13/2019, 07/02/2022   Influenza Split 06/18/2012   Influenza Whole 07/11/2010   Influenza, High Dose Seasonal PF 07/07/2013, 07/14/2014, 06/24/2015, 06/13/2016, 06/04/2017, 06/17/2018   Influenza-Unspecified 06/22/2020, 06/29/2021   Moderna Covid-19 Vaccine Bivalent Booster 36yrs & up 01/26/2022   Moderna  SARS-COV2 Booster Vaccination 07/19/2020, 02/07/2021   Moderna Sars-Covid-2 Vaccination 09/14/2019, 10/12/2019   PFIZER(Purple Top)SARS-COV-2 Vaccination 05/30/2021   Pfizer Covid-19 Vaccine Bivalent Booster 65yrs & up 07/12/2022   Pneumococcal Conjugate-13 04/27/2014, 08/22/2021   Pneumococcal Polysaccharide-23 10/15/2017   Td 09/10/2005   Zoster Recombinat (Shingrix) 08/22/2021, 10/25/2021, 02/28/2022   Zoster, Live 11/29/2006   Pertinent  Health Maintenance Due  Topic Date Due   INFLUENZA VACCINE  04/11/2023   DEXA SCAN  Completed      12/14/2020    5:00 AM 12/14/2020    8:00 AM 12/14/2020    8:29 PM 12/15/2020    8:44 AM 10/02/2022    9:39 AM  Fall Risk  Falls in the past year?     0  Was there an injury with Fall?     0  Fall Risk Category Calculator     0  (RETIRED) Patient Fall Risk Level Moderate fall risk Moderate fall risk Moderate fall risk High fall risk   Patient at Risk for Falls  Due to     No Fall Risks  Fall risk Follow up     Falls evaluation completed   Functional Status Survey:    Vitals:   12/31/22 1150  BP: 134/77  Pulse: 78  Resp: 18  Temp: 97.6 F (36.4 C)  SpO2: 96%  Weight: 183 lb 4 oz (83.1 kg)  Height: 5\' 3"  (1.6 m)   Body mass index is 32.46 kg/m. Physical Exam Constitutional:      Appearance: She is obese.  HENT:     Head: Normocephalic and atraumatic.     Nose: Nose normal.     Mouth/Throat:     Mouth: Mucous membranes are moist.  Eyes:     Conjunctiva/sclera: Conjunctivae normal.  Cardiovascular:     Rate and Rhythm: Normal rate and regular rhythm.  Pulmonary:     Effort: Pulmonary effort is normal.     Breath sounds: Normal breath sounds.  Abdominal:     General: Bowel sounds are normal.     Palpations: Abdomen is soft.     Comments: Has colostomy  Musculoskeletal:        General: Normal range of motion.     Cervical back: Normal range of motion.  Skin:    General: Skin is warm and dry.  Neurological:     General: No focal  deficit present.     Mental Status: She is alert and oriented to person, place, and time.  Psychiatric:        Mood and Affect: Mood normal.        Behavior: Behavior normal.        Thought Content: Thought content normal.        Judgment: Judgment normal.     Labs reviewed: Recent Labs    03/20/22 0000  NA 142  K 3.7  CL 104  CO2 28*  BUN 18  CREATININE 0.7  CALCIUM 9.5   Recent Labs    03/20/22 0000  AST 15  ALT 14  ALKPHOS 63  ALBUMIN 4.6   Recent Labs    04/17/22 0000  NEUTROABS 2,167.00  HGB 13.1  HCT 39  PLT 108*   Lab Results  Component Value Date   TSH 1.40 12/21/2020   Lab Results  Component Value Date   HGBA1C 5.9 (H) 09/26/2015   Lab Results  Component Value Date   CHOL 136 12/21/2020   HDL 93 (A) 12/21/2020   LDLCALC 72 12/21/2020   LDLDIRECT 147.0 11/17/2012   TRIG 119 12/21/2020   CHOLHDL 3 02/10/2020    Significant Diagnostic Results in last 30 days:  No results found.  Assessment/Plan  1. OAB (overactive bladder) - mirabegron ER (MYRBETRIQ) 25 MG TB24 tablet; Take 1 tablet (25 mg total) by mouth daily.  Dispense: 30 tablet; Refill: 6   Family/ staff Communication:  Discussed plan of care with resident and charge nurse.  Labs/tests ordered:  None

## 2023-01-04 DIAGNOSIS — F330 Major depressive disorder, recurrent, mild: Secondary | ICD-10-CM | POA: Diagnosis not present

## 2023-01-04 MED ORDER — MIRABEGRON ER 25 MG PO TB24: 25.0000 mg | ORAL_TABLET | Freq: Every day | ORAL | 6 refills | Status: DC

## 2023-01-15 ENCOUNTER — Encounter: Payer: Self-pay | Admitting: Nurse Practitioner

## 2023-01-15 ENCOUNTER — Non-Acute Institutional Stay (SKILLED_NURSING_FACILITY): Payer: Medicare Other | Admitting: Nurse Practitioner

## 2023-01-15 DIAGNOSIS — Z933 Colostomy status: Secondary | ICD-10-CM

## 2023-01-15 DIAGNOSIS — N390 Urinary tract infection, site not specified: Secondary | ICD-10-CM | POA: Diagnosis not present

## 2023-01-15 DIAGNOSIS — N3281 Overactive bladder: Secondary | ICD-10-CM | POA: Diagnosis not present

## 2023-01-15 DIAGNOSIS — R300 Dysuria: Secondary | ICD-10-CM

## 2023-01-15 DIAGNOSIS — E785 Hyperlipidemia, unspecified: Secondary | ICD-10-CM

## 2023-01-15 DIAGNOSIS — I10 Essential (primary) hypertension: Secondary | ICD-10-CM | POA: Diagnosis not present

## 2023-01-15 DIAGNOSIS — F339 Major depressive disorder, recurrent, unspecified: Secondary | ICD-10-CM

## 2023-01-15 DIAGNOSIS — M1712 Unilateral primary osteoarthritis, left knee: Secondary | ICD-10-CM

## 2023-01-15 NOTE — Progress Notes (Addendum)
Location:  [facility] Room Number: [id number] Place of Service:  SNF (31) Provider:  Cailen Mihalik X, NP  Patient Care Team: Mahlon Gammon, MD as PCP - General (Internal Medicine) Verner Chol, [facility] (Inactive) as Pharmacist (Pharmacist)  Extended Emergency Contact Information Primary Emergency Contact: Julio Sicks [place] Phone: [phone] Relation: Daughter  Code Status:  FULL Goals of care: Advanced Directive information    12/31/2022   12:02 PM  Advanced Directives  Does Patient Have a Medical Advance Directive? Yes  Type of Estate agent of [place];Living will  Does patient want to make changes to medical advance directive? No - Patient declined  Copy of Healthcare Power of Attorney in Chart? Yes - validated most recent copy scanned in chart (See row information)     Chief Complaint  Patient presents with   Acute Visit    Patient is being seen for burning sensation when urinating     HPI:  Pt is a 89 y.o. 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, s/p Hartman's procedure, splenic flexure mobilization             HTN takes Amlodipine Bun/creat 18/0.7 03/20/22             Hypomagnesemia, Mg 1.8 12/20/20             Vit B12 deficiency, takes Vit B, Vit B12 942             Hx of CVA w/o residual deficit             Hyperlipidemia, takes Rosuvastatin, LDL 72 12/20/20             Depression, takes Venlafaxine             Constipation, takes MiraLax Colace             Post op anemia, off Fe Hgb 13.1 04/17/22             Hypokalemia, supplemented, takes Kcl, serum K 3.7 03/20/22             Allergic rhinitis, takes Flonase               OA, pain is controlled, takes Tylenol.        Past Medical History:  Diagnosis Date   Arthritis    Depression    Hyperlipidemia    Osteopenia    Stroke (HCC)    Thrombocytopenia (HCC)    Vitamin D deficiency    Wrist fracture 2002   left   Past Surgical History:  Procedure Laterality Date   APPENDECTOMY  1967   CARPAL TUNNEL RELEASE  2008   right   COLON RESECTION SIGMOID N/A 12/03/2020   Procedure: EXPLORATORY LAPAROTOMY; HARTMAN'S PROCEDURE; SPLEENIC FLEXURE MOBILIZATION;  Surgeon: Romie Levee, MD;  Location: [facility];  Service: General;  Laterality: N/A;   COLOSTOMY N/A 12/03/2020   Procedure: COLOSTOMY;  Surgeon: Romie Levee, MD;  Location: [facility];  Service: General;  Laterality: N/A;   JOINT REPLACEMENT  2002   right total     Allergies  Allergen Reactions   Bactrim [Sulfamethoxazole-Trimethoprim] Nausea Only   Ditropan [Oxybutynin]     Made  patient feel bad   Sulfa Antibiotics     Other reaction(s): stomach upset    Outpatient Encounter Medications as of 01/15/2023  Medication Sig   acetaminophen (TYLENOL) 325 MG tablet Take 2 tablets (650 mg total) by mouth every 6 (six) hours as needed for mild pain or moderate pain.   acetaminophen (TYLENOL) 500 MG tablet Take 1,000 mg by mouth 2 (two) times daily.   Alum & Mag Hydroxide-Simeth (ANTACID & ANTIGAS PO) Take 30 mLs by mouth every 4 (four) hours as needed. for Indigestion   amLODipine (NORVASC) 5 MG tablet Take 5 mg by mouth daily.   amoxicillin (AMOXIL) 500 MG tablet Take 500 mg by mouth once. capsule by mouth one time only for dental procedure for 1 Day 1 hour before dental appointment   aspirin EC 81 MG tablet Take 81 mg by mouth daily. Swallow whole.   calcium carbonate (OSCAL) 1500 (600 Ca) MG TABS tablet Take 600 mg of elemental calcium by mouth daily.   Cholecalciferol (VITAMIN D3) 2000 units capsule Take 2,000 Units by mouth daily.   diclofenac Sodium (VOLTAREN ARTHRITIS PAIN) 1 % GEL Apply 2 g  topically 3 (three) times daily as needed (Left wrist).   fluticasone (FLONASE) 50 MCG/ACT nasal spray Place 1 spray into both nostrils 2 (two) times daily.   loperamide (IMODIUM A-D) 2 MG tablet Take 2 mg by mouth as needed for diarrhea or loose stools.   Magnesium 300 MG CAPS Take 300 mg by mouth daily.   magnesium hydroxide (MILK OF MAGNESIA) 400 MG/5ML suspension Take 30 mLs by mouth daily as needed for mild constipation.   minoxidil (ROGAINE) 2 % external solution Apply topically daily as needed.   mirabegron ER (MYRBETRIQ) 25 MG TB24 tablet Take 1 tablet (25 mg total) by mouth daily.   polyethylene glycol (MIRALAX / GLYCOLAX) 17 g packet Take 17 g by mouth daily.   potassium chloride SA (KLOR-CON) 20 MEQ tablet Take 20 mEq by mouth daily.   Propylene Glycol (SYSTANE BALANCE OP) Apply 1 drop to eye in the morning and at bedtime. related to PRESENCE OF INTRAOCULAR LENS (Z96.1) wait 10 minutes between Systane and timolol drops   rosuvastatin (CRESTOR) 10 MG tablet TAKE 1 TABLET ONCE DAILY.   simethicone (MYLICON) 80 MG chewable tablet Chew 80 mg by mouth 4 (four) times daily as needed.   timolol (TIMOPTIC) 0.5 % ophthalmic solution Place 1 drop into the left eye 2 (two) times daily. related to PRESENCE OF INTRAOCULAR LENS (Z96.1) Wait 10 minutes between Systane and timolol drops   venlafaxine XR (EFFEXOR-XR) 75 MG 24 hr capsule TAKE 2 CAPSULES (150MG ) BY MOUTH DAILY.   vitamin B-12 (CYANOCOBALAMIN) 1000 MCG tablet Take 1,000 mcg by mouth daily.   No facility-administered encounter medications on file as of 01/15/2023.    Review of Systems  Constitutional:  Negative for appetite change, fatigue and fever.  HENT:  Negative for congestion, hearing loss and rhinorrhea.   Eyes:  Negative for visual disturbance.  Respiratory:  Negative for cough, shortness of breath and wheezing.   Cardiovascular:  Positive for leg swelling.  Gastrointestinal:  Negative for abdominal pain, constipation, nausea  and vomiting.  Genitourinary:  Positive for dysuria and frequency. Negative for urgency.  Musculoskeletal:  Positive for arthralgias and gait problem.       Left knee pain is chronic  Skin:  Negative for color change.  Neurological:  Negative for speech difficulty, weakness and light-headedness.  Psychiatric/Behavioral:  Negative for confusion  and sleep disturbance. The patient is not nervous/anxious.     Immunization History  Administered Date(s) Administered   Fluad Quad(high Dose 65+) 06/13/2019, 07/02/2022   Influenza Split 06/18/2012   Influenza Whole 07/11/2010   Influenza, High Dose Seasonal PF 07/07/2013, 07/14/2014, 06/24/2015, 06/13/2016, 06/04/2017, 06/17/2018   Influenza-Unspecified 06/22/2020, 06/29/2021   Moderna Covid-19 Vaccine Bivalent Booster 2yrs & up 01/26/2022   Moderna SARS-COV2 Booster Vaccination 07/19/2020, 02/07/2021   Moderna Sars-Covid-2 Vaccination 09/14/2019, 10/12/2019   PFIZER(Purple Top)SARS-COV-2 Vaccination 05/30/2021   Pfizer Covid-19 Vaccine Bivalent Booster 12yrs & up 07/12/2022   Pneumococcal Conjugate-13 04/27/2014, 08/22/2021   Pneumococcal Polysaccharide-23 10/15/2017   Td 09/10/2005   Zoster Recombinat (Shingrix) 08/22/2021, 10/25/2021, 02/28/2022   Zoster, Live 11/29/2006   Pertinent  Health Maintenance Due  Topic Date Due   INFLUENZA VACCINE  04/11/2023   DEXA SCAN  Completed      12/14/2020    5:00 AM 12/14/2020    8:00 AM 12/14/2020    8:29 PM 12/15/2020    8:44 AM 10/02/2022    9:39 AM  Fall Risk  Falls in the past year?     0  Was there an injury with Fall?     0  Fall Risk Category Calculator     0  (RETIRED) Patient Fall Risk Level Moderate fall risk Moderate fall risk Moderate fall risk High fall risk   Patient at Risk for Falls Due to     No Fall Risks  Fall risk Follow up     Falls evaluation completed   Functional Status Survey:    Vitals:   01/15/23 1111 01/15/23 1112 01/16/23 1231  BP: (!) 157/60 (!) 157/60 138/74   Pulse: 85    Resp: 20    Temp: 97.8 F (36.6 C)    SpO2: 94%    Weight: 186 lb 9.6 oz (84.6 kg)    Height: 5\' 3"  (1.6 m)     Body mass index is 33.05 kg/m. Physical Exam Vitals and nursing note reviewed.  Constitutional:      Appearance: Normal appearance.  HENT:     Head: Normocephalic and atraumatic.     Nose: Nose normal.     Mouth/Throat:     Mouth: Mucous membranes are moist.  Eyes:     Extraocular Movements: Extraocular movements intact.     Conjunctiva/sclera: Conjunctivae normal.     Pupils: Pupils are equal, round, and reactive to light.  Cardiovascular:     Rate and Rhythm: Normal rate and regular rhythm.     Heart sounds: No murmur heard. Pulmonary:     Effort: Pulmonary effort is normal.     Breath sounds: No rales.  Abdominal:     General: Bowel sounds are normal.     Palpations: Abdomen is soft.     Tenderness: There is no abdominal tenderness. There is no right CVA tenderness, left CVA tenderness, guarding or rebound.     Comments: Colostomy  Musculoskeletal:        General: No tenderness.     Cervical back: Normal range of motion and neck supple.     Right lower leg: Edema present.     Left lower leg: Edema present.     Comments: Trace edema in ankles.   Skin:    General: Skin is warm and dry.     Comments: Mid abd surgical scar. Colostomy is functioning.   Neurological:     General: No focal deficit present.     Mental Status:  She is alert and oriented to person, place, and time. Mental status is at baseline.     Gait: Gait abnormal.  Psychiatric:        Mood and Affect: Mood normal.        Behavior: Behavior normal.        Thought Content: Thought content normal.        Judgment: Judgment normal.     Labs reviewed: Recent Labs    03/20/22 0000  NA 142  K 3.7  CL 104  CO2 28*  BUN 18  CREATININE 0.7  CALCIUM 9.5   Recent Labs    03/20/22 0000  AST 15  ALT 14  ALKPHOS 63  ALBUMIN 4.6   Recent Labs    04/17/22 0000   NEUTROABS 2,167.00  HGB 13.1  HCT 39  PLT 108*   Lab Results  Component Value Date   TSH 1.40 12/21/2020   Lab Results  Component Value Date   HGBA1C 5.9 (H) 09/26/2015   Lab Results  Component Value Date   CHOL 136 12/21/2020   HDL 93 (A) 12/21/2020   LDLCALC 72 12/21/2020   LDLDIRECT 147.0 11/17/2012   TRIG 119 12/21/2020   CHOLHDL 3 02/10/2020    Significant Diagnostic Results in last 30 days:  No results found.  Assessment/Plan Dysuria  c/o burning sensation upon urination for a few days, denied lower abd/back pain or discomfort, urinary urgency or incontinency, afebrile, desires to wait for tx when urine culture is available.  01/15/23 urine culture E-coli 10-49,000c/ml, observe.   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 Ambulates with walker.        Family/ staff Communication: plan of care reviewed with the patient and charge nurse.   Labs/tests ordered: UA C/S  Time spend 40 minutes.

## 2023-01-17 ENCOUNTER — Encounter: Payer: Self-pay | Admitting: Nurse Practitioner

## 2023-01-17 DIAGNOSIS — R300 Dysuria: Secondary | ICD-10-CM | POA: Insufficient documentation

## 2023-01-17 DIAGNOSIS — N3281 Overactive bladder: Secondary | ICD-10-CM | POA: Insufficient documentation

## 2023-01-17 DIAGNOSIS — R0600 Dyspnea, unspecified: Secondary | ICD-10-CM | POA: Insufficient documentation

## 2023-01-17 NOTE — Assessment & Plan Note (Signed)
takes Amlodipine Bun/creat 18/0.7 7

## 2023-01-17 NOTE — Assessment & Plan Note (Signed)
takes Rosuvastatin, LDL 72 12/20/20 

## 2023-01-17 NOTE — Assessment & Plan Note (Addendum)
c/o burning sensation upon urination for a few days, denied lower abd/back pain or discomfort, urinary urgency or incontinency, afebrile, desires to wait for tx when urine culture is available.  01/15/23 urine culture E-coli 10-49,000c/ml, observe.

## 2023-01-17 NOTE — Assessment & Plan Note (Signed)
Her mood is stable, takes Venlafaxine, MMSE 30/30 12/14/22

## 2023-01-17 NOTE — Assessment & Plan Note (Signed)
pain is controlled, takes Tylenol. Ambulates with walker.

## 2023-01-17 NOTE — Assessment & Plan Note (Signed)
started Myrbetriq 12/31/22, wants to dc 2/2 lack of efficacy and pricey

## 2023-01-17 NOTE — Assessment & Plan Note (Signed)
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 

## 2023-01-18 ENCOUNTER — Encounter: Payer: Self-pay | Admitting: Nurse Practitioner

## 2023-01-18 NOTE — Progress Notes (Signed)
This encounter was created in error - please disregard.

## 2023-02-08 DIAGNOSIS — F330 Major depressive disorder, recurrent, mild: Secondary | ICD-10-CM | POA: Diagnosis not present

## 2023-02-19 DIAGNOSIS — L814 Other melanin hyperpigmentation: Secondary | ICD-10-CM | POA: Diagnosis not present

## 2023-02-19 DIAGNOSIS — L821 Other seborrheic keratosis: Secondary | ICD-10-CM | POA: Diagnosis not present

## 2023-02-19 DIAGNOSIS — L570 Actinic keratosis: Secondary | ICD-10-CM | POA: Diagnosis not present

## 2023-04-22 DIAGNOSIS — H9311 Tinnitus, right ear: Secondary | ICD-10-CM | POA: Diagnosis not present

## 2023-04-22 DIAGNOSIS — H903 Sensorineural hearing loss, bilateral: Secondary | ICD-10-CM | POA: Diagnosis not present

## 2023-04-23 DIAGNOSIS — L570 Actinic keratosis: Secondary | ICD-10-CM | POA: Diagnosis not present

## 2023-05-01 DIAGNOSIS — R41841 Cognitive communication deficit: Secondary | ICD-10-CM | POA: Diagnosis not present

## 2023-05-03 DIAGNOSIS — F330 Major depressive disorder, recurrent, mild: Secondary | ICD-10-CM | POA: Diagnosis not present

## 2023-05-03 DIAGNOSIS — R41841 Cognitive communication deficit: Secondary | ICD-10-CM | POA: Diagnosis not present

## 2023-05-06 DIAGNOSIS — R41841 Cognitive communication deficit: Secondary | ICD-10-CM | POA: Diagnosis not present

## 2023-05-10 ENCOUNTER — Encounter: Payer: Self-pay | Admitting: Nurse Practitioner

## 2023-05-10 ENCOUNTER — Non-Acute Institutional Stay: Payer: Medicare Other | Admitting: Nurse Practitioner

## 2023-05-10 DIAGNOSIS — W19XXXS Unspecified fall, sequela: Secondary | ICD-10-CM | POA: Diagnosis not present

## 2023-05-10 DIAGNOSIS — R3915 Urgency of urination: Secondary | ICD-10-CM

## 2023-05-10 DIAGNOSIS — Z933 Colostomy status: Secondary | ICD-10-CM

## 2023-05-10 DIAGNOSIS — K5901 Slow transit constipation: Secondary | ICD-10-CM | POA: Diagnosis not present

## 2023-05-10 DIAGNOSIS — M545 Low back pain, unspecified: Secondary | ICD-10-CM

## 2023-05-10 DIAGNOSIS — N3281 Overactive bladder: Secondary | ICD-10-CM

## 2023-05-10 DIAGNOSIS — F339 Major depressive disorder, recurrent, unspecified: Secondary | ICD-10-CM

## 2023-05-10 DIAGNOSIS — E785 Hyperlipidemia, unspecified: Secondary | ICD-10-CM

## 2023-05-10 DIAGNOSIS — E876 Hypokalemia: Secondary | ICD-10-CM | POA: Diagnosis not present

## 2023-05-10 DIAGNOSIS — E538 Deficiency of other specified B group vitamins: Secondary | ICD-10-CM | POA: Diagnosis not present

## 2023-05-10 DIAGNOSIS — I10 Essential (primary) hypertension: Secondary | ICD-10-CM | POA: Diagnosis not present

## 2023-05-13 DIAGNOSIS — M6281 Muscle weakness (generalized): Secondary | ICD-10-CM | POA: Diagnosis not present

## 2023-05-13 DIAGNOSIS — R2681 Unsteadiness on feet: Secondary | ICD-10-CM | POA: Diagnosis not present

## 2023-05-13 DIAGNOSIS — R0602 Shortness of breath: Secondary | ICD-10-CM | POA: Diagnosis not present

## 2023-05-13 DIAGNOSIS — R41841 Cognitive communication deficit: Secondary | ICD-10-CM | POA: Diagnosis not present

## 2023-05-15 DIAGNOSIS — R2681 Unsteadiness on feet: Secondary | ICD-10-CM | POA: Diagnosis not present

## 2023-05-15 DIAGNOSIS — R0602 Shortness of breath: Secondary | ICD-10-CM | POA: Diagnosis not present

## 2023-05-15 DIAGNOSIS — M6281 Muscle weakness (generalized): Secondary | ICD-10-CM | POA: Diagnosis not present

## 2023-05-15 DIAGNOSIS — R41841 Cognitive communication deficit: Secondary | ICD-10-CM | POA: Diagnosis not present

## 2023-05-16 DIAGNOSIS — M25552 Pain in left hip: Secondary | ICD-10-CM | POA: Diagnosis not present

## 2023-05-16 DIAGNOSIS — W19XXXA Unspecified fall, initial encounter: Secondary | ICD-10-CM | POA: Insufficient documentation

## 2023-05-16 DIAGNOSIS — M545 Low back pain, unspecified: Secondary | ICD-10-CM | POA: Insufficient documentation

## 2023-05-16 DIAGNOSIS — M25551 Pain in right hip: Secondary | ICD-10-CM | POA: Diagnosis not present

## 2023-05-16 NOTE — Assessment & Plan Note (Signed)
some lower back pain noted, but able to ambulates and Tylenol helped. May X-ray lumbar spine, pelvis, hips if no better.

## 2023-05-16 NOTE — Assessment & Plan Note (Signed)
due to diverticulitis with distal sigmoid obstruction, s/p Hartman's procedure, splenic flexure mobilization ?

## 2023-05-16 NOTE — Assessment & Plan Note (Signed)
Blood pressure is controlled, takes Amlodipine Bun/creat 18/0.7 03/20/22

## 2023-05-16 NOTE — Assessment & Plan Note (Signed)
takes MiraLax Colace

## 2023-05-16 NOTE — Assessment & Plan Note (Signed)
takes Rosuvastatin, LDL 72 12/20/20

## 2023-05-16 NOTE — Assessment & Plan Note (Signed)
supplemented, takes Kcl, serum K 3.7 03/20/22

## 2023-05-16 NOTE — Assessment & Plan Note (Signed)
failed Myrbetriq lack of efficacy and pricey

## 2023-05-16 NOTE — Progress Notes (Addendum)
Location:   [facility] Room Number: [id number] Place of Service:  ALF [phone]) Provider: [facility] Coraleigh Sheeran NP  Mahlon Gammon, MD  Patient Care Team: Mahlon Gammon, MD as PCP - General (Internal Medicine) Verner Chol, [facility] (Inactive) as Pharmacist (Pharmacist)  Extended Emergency Contact Information Primary Emergency Contact: Julio Sicks [place] Phone: [phone] Relation: Daughter  Code Status: DNR Goals of care: Advanced Directive information    12/31/2022   12:02 PM  Advanced Directives  Does Patient Have a Medical Advance Directive? Yes  Type of Estate agent of [place];Living will  Does patient want to make changes to medical advance directive? No - Patient declined  Copy of Healthcare Power of Attorney in Chart? Yes - validated most recent copy scanned in chart (See row information)     Chief Complaint  Patient presents with  . Acute Visit    Fall    HPI:  Pt is a 89 y.o. female seen today for an acute visit for fall when the patient tripped over the entrance of dinning room, some lower back pain noted, but able to ambulates and Tylenol helped.   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, s/p Hartman's procedure, splenic flexure mobilization              HTN takes Amlodipine Bun/creat 18/0.7 03/20/22             Hypomagnesemia, Mg 1.8 12/20/20             Vit B12 deficiency, takes Vit B, Vit B12 942             Hx of CVA w/o residual deficit             Hyperlipidemia, takes Rosuvastatin, LDL 72 12/20/20             Depression, takes Venlafaxine             Constipation, takes MiraLax Colace             Post op anemia, off Fe Hgb 13.1 04/17/22             Hypokalemia, supplemented, takes Kcl, serum K 3.7 03/20/22             Allergic rhinitis, takes Flonase              OA, pain is controlled, takes Tylenol.      Past Medical  History:  Diagnosis Date  . Arthritis   . Depression   . Hyperlipidemia   . Osteopenia   . Stroke (HCC)   . Thrombocytopenia (HCC)   . Vitamin D deficiency   . Wrist fracture 2002   left   Past Surgical History:  Procedure Laterality Date  . APPENDECTOMY  1967  . CARPAL TUNNEL RELEASE  2008   right  . COLON RESECTION SIGMOID N/A 12/03/2020   Procedure: EXPLORATORY LAPAROTOMY; HARTMAN'S PROCEDURE; SPLEENIC FLEXURE MOBILIZATION;  Surgeon: Romie Levee, MD;  Location: [facility];  Service: General;  Laterality: N/A;  . COLOSTOMY N/A 12/03/2020   Procedure: COLOSTOMY;  Surgeon: Romie Levee, MD;  Location: [facility];  Service: General;  Laterality: N/A;  . JOINT REPLACEMENT  2002   right total     Allergies  Allergen Reactions  . Bactrim [Sulfamethoxazole-Trimethoprim] Nausea Only  . Ditropan [Oxybutynin]  Made patient feel bad  . Sulfa Antibiotics     Other reaction(s): stomach upset    Allergies as of 05/10/2023       Reactions   Bactrim [sulfamethoxazole-trimethoprim] Nausea Only   Ditropan [oxybutynin]    Made patient feel bad   Sulfa Antibiotics    Other reaction(s): stomach upset        Medication List        Accurate as of May 10, 2023 11:59 PM. If you have any questions, ask your nurse or doctor.          acetaminophen 500 MG tablet Commonly known as: TYLENOL Take 1,000 mg by mouth 2 (two) times daily.   acetaminophen 325 MG tablet Commonly known as: TYLENOL Take 2 tablets (650 mg total) by mouth every 6 (six) hours as needed for mild pain or moderate pain.   amLODipine 5 MG tablet Commonly known as: NORVASC Take 5 mg by mouth daily.   amoxicillin 500 MG tablet Commonly known as: AMOXIL Take 500 mg by mouth once. capsule by mouth one time only for dental procedure for 1 Day 1 hour before dental appointment   ANTACID & ANTIGAS PO Take 30 mLs by mouth every 4 (four) hours as needed. for Indigestion   aspirin EC 81 MG tablet Take 81 mg by  mouth daily. Swallow whole.   calcium carbonate 1500 (600 Ca) MG Tabs tablet Commonly known as: OSCAL Take 600 mg of elemental calcium by mouth daily.   cyanocobalamin 1000 MCG tablet Commonly known as: VITAMIN B12 Take 1,000 mcg by mouth daily.   fluticasone 50 MCG/ACT nasal spray Commonly known as: FLONASE Place 1 spray into both nostrils 2 (two) times daily.   loperamide 2 MG tablet Commonly known as: IMODIUM A-D Take 2 mg by mouth as needed for diarrhea or loose stools.   Magnesium 300 MG Caps Take 300 mg by mouth daily.   magnesium hydroxide 400 MG/5ML suspension Commonly known as: MILK OF MAGNESIA Take 30 mLs by mouth daily as needed for mild constipation.   minoxidil 2 % external solution Commonly known as: ROGAINE Apply topically daily as needed.   mirabegron ER 25 MG Tb24 tablet Commonly known as: Myrbetriq Take 1 tablet (25 mg total) by mouth daily.   polyethylene glycol 17 g packet Commonly known as: MIRALAX / GLYCOLAX Take 17 g by mouth daily.   potassium chloride SA 20 MEQ tablet Commonly known as: KLOR-CON M Take 20 mEq by mouth daily.   rosuvastatin 10 MG tablet Commonly known as: CRESTOR TAKE 1 TABLET ONCE DAILY.   simethicone 80 MG chewable tablet Commonly known as: MYLICON Chew 80 mg by mouth 4 (four) times daily as needed.   SYSTANE BALANCE OP Apply 1 drop to eye in the morning and at bedtime. related to PRESENCE OF INTRAOCULAR LENS (Z96.1) wait 10 minutes between Systane and timolol drops   timolol 0.5 % ophthalmic solution Commonly known as: TIMOPTIC Place 1 drop into the left eye 2 (two) times daily. related to PRESENCE OF INTRAOCULAR LENS (Z96.1) Wait 10 minutes between Systane and timolol drops   venlafaxine XR 75 MG 24 hr capsule Commonly known as: EFFEXOR-XR TAKE 2 CAPSULES (150MG ) BY MOUTH DAILY.   Vitamin D3 50 MCG (2000 UT) capsule Take 2,000 Units by mouth daily.   Voltaren Arthritis Pain 1 % Gel Generic drug: diclofenac  Sodium Apply 2 g topically 3 (three) times daily as needed (Left wrist).        Review  of Systems  Constitutional:  Negative for appetite change, fatigue and fever.  HENT:  Negative for congestion, hearing loss and rhinorrhea.   Eyes:  Negative for visual disturbance.  Respiratory:  Negative for cough, shortness of breath and wheezing.   Cardiovascular:  Positive for leg swelling.  Gastrointestinal:  Negative for abdominal pain, constipation, nausea and vomiting.  Genitourinary:  Positive for dysuria and frequency. Negative for urgency.  Musculoskeletal:  Positive for arthralgias, back pain and gait problem.       Left knee pain is chronic. Lower back pain noted since fall 05/09/23  Skin:  Negative for color change.  Neurological:  Negative for speech difficulty, weakness and light-headedness.  Psychiatric/Behavioral:  Negative for confusion and sleep disturbance. The patient is not nervous/anxious.     Immunization History  Administered Date(s) Administered  . Fluad Quad(high Dose 65+) 06/13/2019, 07/02/2022  . Influenza Split 06/18/2012  . Influenza Whole 07/11/2010  . Influenza, High Dose Seasonal PF 07/07/2013, 07/14/2014, 06/24/2015, 06/13/2016, 06/04/2017, 06/17/2018  . Influenza-Unspecified 06/22/2020, 06/29/2021  . Moderna Covid-19 Vaccine Bivalent Booster 65yrs & up 01/26/2022  . Moderna SARS-COV2 Booster Vaccination 07/19/2020, 02/07/2021  . Moderna Sars-Covid-2 Vaccination 09/14/2019, 10/12/2019  . PFIZER(Purple Top)SARS-COV-2 Vaccination 05/30/2021  . [employer] officer, [employer] 26yrs & up 07/12/2022  . Pneumococcal Conjugate-13 04/27/2014, 08/22/2021  . Pneumococcal Polysaccharide-23 10/15/2017  . Td 09/10/2005  . Zoster Recombinant(Shingrix) 08/22/2021, 10/25/2021, 02/28/2022  . Zoster, Live 11/29/2006   Pertinent  Health Maintenance Due  Topic Date Due  . INFLUENZA VACCINE  04/11/2023  . DEXA SCAN  Completed      12/14/2020    5:00 AM 12/14/2020     8:00 AM 12/14/2020    8:29 PM 12/15/2020    8:44 AM 10/02/2022    9:39 AM  Fall Risk  Falls in the past year?     0  Was there an injury with Fall?     0  Fall Risk Category Calculator     0  (RETIRED) Patient Fall Risk Level Moderate fall risk Moderate fall risk Moderate fall risk High fall risk   Patient at Risk for Falls Due to     No Fall Risks  Fall risk Follow up     Falls evaluation completed   Functional Status Survey:    Vitals:   05/10/23 1652 05/15/23 1541  BP: (!) 179/80 129/74  Pulse: 84   Resp: 20   Temp: 98.2 F (36.8 C)   SpO2: 94%   Weight: 188 lb 1.6 oz (85.3 kg)    Body mass index is 33.32 kg/m. Physical Exam Vitals and nursing note reviewed.  Constitutional:      Appearance: Normal appearance.  HENT:     Head: Normocephalic and atraumatic.     Nose: Nose normal.     Mouth/Throat:     Mouth: Mucous membranes are moist.  Eyes:     Extraocular Movements: Extraocular movements intact.     Conjunctiva/sclera: Conjunctivae normal.     Pupils: Pupils are equal, round, and reactive to light.  Cardiovascular:     Rate and Rhythm: Normal rate and regular rhythm.     Heart sounds: No murmur heard. Pulmonary:     Effort: Pulmonary effort is normal.     Breath sounds: No rales.  Abdominal:     General: Bowel sounds are normal.     Palpations: Abdomen is soft.     Tenderness: There is no abdominal tenderness. There is no right CVA tenderness, left  CVA tenderness, guarding or rebound.     Comments: Colostomy  Musculoskeletal:        General: Tenderness present.     Cervical back: Normal range of motion and neck supple.     Right lower leg: Edema present.     Left lower leg: Edema present.     Comments: Trace edema in ankles. C/o pain in lower back, no spinal spinous process tenderness, SIJ tenderness, or groin pain palpated. Able to ambulate with walker.   Skin:    General: Skin is warm and dry.     Comments: Mid abd surgical scar. Colostomy is  functioning.   Neurological:     General: No focal deficit present.     Mental Status: She is alert and oriented to person, place, and time. Mental status is at baseline.     Gait: Gait abnormal.  Psychiatric:        Mood and Affect: Mood normal.        Behavior: Behavior normal.        Thought Content: Thought content normal.        Judgment: Judgment normal.    Labs reviewed: No results for input(s): "NA", "K", "CL", "CO2", "GLUCOSE", "BUN", "CREATININE", "CALCIUM", "MG", "PHOS" in the last 8760 hours. No results for input(s): "AST", "ALT", "ALKPHOS", "BILITOT", "PROT", "ALBUMIN" in the last 8760 hours. No results for input(s): "WBC", "NEUTROABS", "HGB", "HCT", "MCV", "PLT" in the last 8760 hours. Lab Results  Component Value Date   TSH 1.40 12/21/2020   Lab Results  Component Value Date   HGBA1C 5.9 (H) 09/26/2015   Lab Results  Component Value Date   CHOL 136 12/21/2020   HDL 93 (A) 12/21/2020   LDLCALC 72 12/21/2020   LDLDIRECT 147.0 11/17/2012   TRIG 119 12/21/2020   CHOLHDL 3 02/10/2020    Significant Diagnostic Results in last 30 days:  No results found.  Assessment/Plan: Fall fall when the patient tripped over the entrance of dinning room, some lower back pain noted, but able to ambulates and Tylenol helped. May X-ray lumbar spine, pelvis, hips if no better.   Urinary urgency failed Myrbetriq lack of efficacy and pricey  Colostomy status (HCC) due to diverticulitis with distal sigmoid obstruction, s/p Hartman's procedure, splenic flexure mobilization  OAB (overactive bladder) failed Myrbetriq lack of efficacy and pricey  Essential hypertension Blood pressure is controlled, takes Amlodipine Bun/creat 18/0.7 03/20/22  Vitamin B12 deficiency takes Vit B, Vit B12 942  Hyperlipidemia LDL goal <70 takes Rosuvastatin, LDL 72 12/20/20  Depression, recurrent (HCC)  takes Venlafaxine, her mood is stable.   Slow transit constipation  takes MiraLax  Colace  Hypokalemia  supplemented, takes Kcl, serum K 3.7 03/20/22  Lower back pain some lower back pain noted, but able to ambulates and Tylenol helped. May X-ray lumbar spine, pelvis, hips if no better.     Family/ staff Communication: plan of care reviewed with the patient and charge nurse  Labs/tests ordered:  X-ray lumbar spine, pelvis, hips  Time spend 40 minutes.

## 2023-05-16 NOTE — Assessment & Plan Note (Signed)
takes Venlafaxine, her mood is stable.

## 2023-05-16 NOTE — Assessment & Plan Note (Signed)
takes Vit B, Vit B12 942

## 2023-05-16 NOTE — Assessment & Plan Note (Signed)
fall when the patient tripped over the entrance of dinning room, some lower back pain noted, but able to ambulates and Tylenol helped. May X-ray lumbar spine, pelvis, hips if no better.

## 2023-05-17 ENCOUNTER — Telehealth: Payer: Medicare Other | Admitting: Nurse Practitioner

## 2023-05-17 ENCOUNTER — Non-Acute Institutional Stay: Payer: Medicare Other | Admitting: Nurse Practitioner

## 2023-05-17 ENCOUNTER — Encounter: Payer: Self-pay | Admitting: Nurse Practitioner

## 2023-05-17 DIAGNOSIS — F339 Major depressive disorder, recurrent, unspecified: Secondary | ICD-10-CM

## 2023-05-17 DIAGNOSIS — M546 Pain in thoracic spine: Secondary | ICD-10-CM | POA: Diagnosis not present

## 2023-05-17 DIAGNOSIS — Z933 Colostomy status: Secondary | ICD-10-CM

## 2023-05-17 DIAGNOSIS — E876 Hypokalemia: Secondary | ICD-10-CM

## 2023-05-17 DIAGNOSIS — I10 Essential (primary) hypertension: Secondary | ICD-10-CM

## 2023-05-17 DIAGNOSIS — M549 Dorsalgia, unspecified: Secondary | ICD-10-CM | POA: Insufficient documentation

## 2023-05-17 DIAGNOSIS — K5901 Slow transit constipation: Secondary | ICD-10-CM

## 2023-05-17 DIAGNOSIS — W19XXXA Unspecified fall, initial encounter: Secondary | ICD-10-CM | POA: Diagnosis not present

## 2023-05-17 DIAGNOSIS — E538 Deficiency of other specified B group vitamins: Secondary | ICD-10-CM | POA: Diagnosis not present

## 2023-05-17 DIAGNOSIS — N3941 Urge incontinence: Secondary | ICD-10-CM | POA: Diagnosis not present

## 2023-05-17 NOTE — Assessment & Plan Note (Signed)
Blood pressure is controlled, takes Amlodipine Bun/creat 18/0.7 03/20/22

## 2023-05-17 NOTE — Telephone Encounter (Signed)
Staff called oncall tonight to report patient having increase in pain in her low back after recent fall. Xrays were previously ordered and nurse reported no acute findings were on xray. She was prescribed hydrocodone 5/325 mg 1/2 tablet twice daily. Nursing reports previous tylenol order was d/c'd which she previously had prescribed for OA.  Pain 7/10.  Will have staff give tylenol 650 mg by mouth TID at this time and with hydrocodone-apap 5/325 mg Q12 hours PRN to give 1/2 tablet for mild-moderate pain and 1 tablet for severe pain.  Not to exceed 3000 mg of tylenol from all sources.  To notify if pain uncontrolled or change in condition.

## 2023-05-17 NOTE — Assessment & Plan Note (Signed)
Stable,  takes MiraLax Colace

## 2023-05-17 NOTE — Progress Notes (Unsigned)
Location:  [facility] Room Number: 913-A Place of Service:  ALF [phone]) Provider:  Floreine Kingdon X, NP    Patient Care Team: Mahlon Gammon, MD as PCP - General (Internal Medicine) Verner Chol, [facility] (Inactive) as Pharmacist (Pharmacist)  Extended Emergency Contact Information Primary Emergency Contact: Julio Sicks [place] Phone: [phone] Relation: Daughter  Code Status:  Full Code Goals of care: Advanced Directive information    05/17/2023    9:46 AM  Advanced Directives  Does Patient Have a Medical Advance Directive? Yes  Type of Advance Directive Living will;Healthcare Power of Attorney  Does patient want to make changes to medical advance directive? No - Patient declined  Copy of Healthcare Power of Attorney in Chart? Yes - validated most recent copy scanned in chart (See row information)     Chief Complaint  Patient presents with   Acute Visit    Lower Back Pain    HPI:  Pt is a 89 y.o. female seen today for an acute visit for mid to lower back pain, lower mid back tenderness on the spinal spinous processes noted, no difficulty breathing.    Larey Seat 05/09/23 when the patient tripped over the entrance of dinning room, some lower back pain noted, but able to ambulates, pain is not well controlled, started Norco 5/325mg  bid subsequently. X-ray 05/16/23 lumbar spine, pelvis, hips showed no acute abnormality.              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, s/p Hartman's procedure, splenic flexure mobilization              HTN takes Amlodipine Bun/creat 18/0.7 03/20/22             Hypomagnesemia, Mg 1.8 12/20/20             Vit B12 deficiency, takes Vit B, Vit B12 942             Hx of CVA w/o residual deficit             Hyperlipidemia, takes Rosuvastatin, LDL 72 12/20/20             Depression, takes Venlafaxine             Constipation,  takes MiraLax Colace             Post op anemia, off Fe Hgb 13.1 04/17/22             Hypokalemia, supplemented, takes Kcl, serum K 3.7 03/20/22             Allergic rhinitis, takes Flonase              OA, mid to lower back, takes Norco Past Medical History:  Diagnosis Date   Arthritis    Depression    Hyperlipidemia    Osteopenia    Stroke (HCC)    Thrombocytopenia (HCC)    Vitamin D deficiency    Wrist fracture 2002   left   Past Surgical History:  Procedure Laterality Date   APPENDECTOMY  1967   CARPAL TUNNEL RELEASE  2008   right   COLON RESECTION SIGMOID N/A 12/03/2020   Procedure: EXPLORATORY LAPAROTOMY; HARTMAN'S PROCEDURE; SPLEENIC FLEXURE MOBILIZATION;  Surgeon: Romie Levee, MD;  Location: [facility];  Service: General;  Laterality: N/A;   COLOSTOMY N/A 12/03/2020  Procedure: COLOSTOMY;  Surgeon: Romie Levee, MD;  Location: [facility];  Service: General;  Laterality: N/A;   JOINT REPLACEMENT  2002   right total     Allergies  Allergen Reactions   Bactrim [Sulfamethoxazole-Trimethoprim] Nausea Only   Ditropan [Oxybutynin]     Made patient feel bad   Sulfa Antibiotics     Other reaction(s): stomach upset    Outpatient Encounter Medications as of 05/17/2023  Medication Sig   Alum & Mag Hydroxide-Simeth (ANTACID & ANTIGAS PO) Take 30 mLs by mouth every 4 (four) hours as needed. for Indigestion   amLODipine (NORVASC) 5 MG tablet Take 5 mg by mouth daily.   aspirin EC 81 MG tablet Take 81 mg by mouth daily. Swallow whole.   calcium carbonate (OSCAL) 1500 (600 Ca) MG TABS tablet Take 600 mg of elemental calcium by mouth daily.   Cholecalciferol (VITAMIN D3) 2000 units capsule Take 2,000 Units by mouth daily.   diclofenac Sodium (VOLTAREN ARTHRITIS PAIN) 1 % GEL Apply 2 g topically 3 (three) times daily as needed (Left wrist).   fluticasone (FLONASE) 50 MCG/ACT nasal spray Place 1 spray into both nostrils 2 (two) times daily.   HYDROcodone-acetaminophen (NORCO/VICODIN) 5-325  MG tablet Take 0.5 tablets by mouth 2 (two) times daily.   loperamide (IMODIUM A-D) 2 MG tablet Take 2 mg by mouth as needed for diarrhea or loose stools.   Magnesium 300 MG CAPS Take 300 mg by mouth daily.   magnesium hydroxide (MILK OF MAGNESIA) 400 MG/5ML suspension Take 30 mLs by mouth daily as needed for mild constipation.   minoxidil (ROGAINE) 2 % external solution Apply topically daily as needed.   polyethylene glycol (MIRALAX / GLYCOLAX) 17 g packet Take 17 g by mouth daily.   potassium chloride SA (KLOR-CON) 20 MEQ tablet Take 20 mEq by mouth daily.   Propylene Glycol (SYSTANE BALANCE OP) Apply 1 drop to eye in the morning and at bedtime. related to PRESENCE OF INTRAOCULAR LENS (Z96.1) wait 10 minutes between Systane and timolol drops   rosuvastatin (CRESTOR) 10 MG tablet TAKE 1 TABLET ONCE DAILY.   simethicone (MYLICON) 80 MG chewable tablet Chew 80 mg by mouth 4 (four) times daily as needed.   timolol (TIMOPTIC) 0.5 % ophthalmic solution Place 1 drop into the left eye 2 (two) times daily. related to PRESENCE OF INTRAOCULAR LENS (Z96.1) Wait 10 minutes between Systane and timolol drops   venlafaxine XR (EFFEXOR-XR) 75 MG 24 hr capsule TAKE 2 CAPSULES (150MG ) BY MOUTH DAILY.   vitamin B-12 (CYANOCOBALAMIN) 1000 MCG tablet Take 1,000 mcg by mouth daily.   [DISCONTINUED] acetaminophen (TYLENOL) 325 MG tablet Take 2 tablets (650 mg total) by mouth every 6 (six) hours as needed for mild pain or moderate pain.   [DISCONTINUED] acetaminophen (TYLENOL) 500 MG tablet Take 1,000 mg by mouth 2 (two) times daily.   [DISCONTINUED] amoxicillin (AMOXIL) 500 MG tablet Take 500 mg by mouth once. capsule by mouth one time only for dental procedure for 1 Day 1 hour before dental appointment   [DISCONTINUED] mirabegron ER (MYRBETRIQ) 25 MG TB24 tablet Take 1 tablet (25 mg total) by mouth daily.   No facility-administered encounter medications on file as of 05/17/2023.    Review of Systems  Constitutional:   Negative for appetite change, fatigue and fever.  HENT:  Negative for congestion, hearing loss and rhinorrhea.   Eyes:  Negative for visual disturbance.  Respiratory:  Negative for cough, shortness of breath and wheezing.   Cardiovascular:  Positive for leg swelling.  Gastrointestinal:  Negative for abdominal pain, constipation, nausea and vomiting.  Genitourinary:  Positive for frequency. Negative for dysuria and urgency.  Musculoskeletal:  Positive for arthralgias, back pain and gait problem.       Left knee pain is chronic. Lower back pain mid to lower noted since fall 05/09/23  Skin:  Negative for color change.  Neurological:  Negative for speech difficulty, weakness and light-headedness.  Psychiatric/Behavioral:  Negative for confusion and sleep disturbance. The patient is not nervous/anxious.     Immunization History  Administered Date(s) Administered   Fluad Quad(high Dose 65+) 06/13/2019, 07/02/2022   Influenza Split 06/18/2012   Influenza Whole 07/11/2010   Influenza, High Dose Seasonal PF 07/07/2013, 07/14/2014, 06/24/2015, 06/13/2016, 06/04/2017, 06/17/2018   Influenza-Unspecified 06/22/2020, 06/29/2021   Moderna Covid-19 Vaccine Bivalent Booster 27yrs & up 01/26/2022   Moderna SARS-COV2 Booster Vaccination 07/19/2020, 02/07/2021   Moderna Sars-Covid-2 Vaccination 09/14/2019, 10/12/2019   PFIZER(Purple Top)SARS-COV-2 Vaccination 05/30/2021   Pfizer Covid-19 Vaccine Bivalent Booster 43yrs & up 07/12/2022   Pneumococcal Conjugate-13 04/27/2014, 08/22/2021   Pneumococcal Polysaccharide-23 10/15/2017   Td 09/10/2005   Zoster Recombinant(Shingrix) 08/22/2021, 10/25/2021, 02/28/2022   Zoster, Live 11/29/2006   Pertinent  Health Maintenance Due  Topic Date Due   INFLUENZA VACCINE  04/11/2023   DEXA SCAN  Completed      12/14/2020    5:00 AM 12/14/2020    8:00 AM 12/14/2020    8:29 PM 12/15/2020    8:44 AM 10/02/2022    9:39 AM  Fall Risk  Falls in the past year?     0  Was  there an injury with Fall?     0  Fall Risk Category Calculator     0  (RETIRED) Patient Fall Risk Level Moderate fall risk Moderate fall risk Moderate fall risk High fall risk   Patient at Risk for Falls Due to     No Fall Risks  Fall risk Follow up     Falls evaluation completed   Functional Status Survey:    Vitals:   05/17/23 0920  BP: 129/74  Pulse: 86  Resp: 18  Temp: 97.8 F (36.6 C)  SpO2: 96%  Weight: 190 lb 9.6 oz (86.5 kg)  Height: 5\' 3"  (1.6 m)   Body mass index is 33.76 kg/m. Physical Exam Vitals and nursing note reviewed.  Constitutional:      Appearance: Normal appearance.  HENT:     Head: Normocephalic and atraumatic.     Nose: Nose normal.     Mouth/Throat:     Mouth: Mucous membranes are moist.  Eyes:     Extraocular Movements: Extraocular movements intact.     Conjunctiva/sclera: Conjunctivae normal.     Pupils: Pupils are equal, round, and reactive to light.  Cardiovascular:     Rate and Rhythm: Normal rate and regular rhythm.     Heart sounds: No murmur heard. Pulmonary:     Effort: Pulmonary effort is normal.     Breath sounds: No rales.  Abdominal:     General: Bowel sounds are normal.     Palpations: Abdomen is soft.     Tenderness: There is no abdominal tenderness. There is no right CVA tenderness, left CVA tenderness, guarding or rebound.     Comments: Colostomy  Musculoskeletal:        General: Tenderness present.     Cervical back: Normal range of motion and neck supple.     Right lower leg: Edema present.  Left lower leg: Edema present.     Comments: Trace edema in ankles. C/o pain in mid to lower back, lower mid spinal spinous process tenderness noted, no SIJ tenderness, or groin pain palpated. Able to ambulate with walker.   Skin:    General: Skin is warm and dry.     Comments: Mid abd surgical scar. Colostomy is functioning.   Neurological:     General: No focal deficit present.     Mental Status: She is alert and oriented to  person, place, and time. Mental status is at baseline.     Gait: Gait abnormal.  Psychiatric:        Mood and Affect: Mood normal.        Behavior: Behavior normal.        Thought Content: Thought content normal.        Judgment: Judgment normal.     Labs reviewed: No results for input(s): "NA", "K", "CL", "CO2", "GLUCOSE", "BUN", "CREATININE", "CALCIUM", "MG", "PHOS" in the last 8760 hours. No results for input(s): "AST", "ALT", "ALKPHOS", "BILITOT", "PROT", "ALBUMIN" in the last 8760 hours. No results for input(s): "WBC", "NEUTROABS", "HGB", "HCT", "MCV", "PLT" in the last 8760 hours. Lab Results  Component Value Date   TSH 1.40 12/21/2020   Lab Results  Component Value Date   HGBA1C 5.9 (H) 09/26/2015   Lab Results  Component Value Date   CHOL 136 12/21/2020   HDL 93 (A) 12/21/2020   LDLCALC 72 12/21/2020   LDLDIRECT 147.0 11/17/2012   TRIG 119 12/21/2020   CHOLHDL 3 02/10/2020    Significant Diagnostic Results in last 30 days:  No results found.  Assessment/Plan Lower back pain 05/16/23 X-ray no acute abnormality L spine, hips, pelvis,   Mild back pain mid to lower back pain, lower mid back tenderness on the spinal spinous processes noted, no difficulty breathing.    Larey Seat 05/09/23 when the patient tripped over the entrance of dinning room, some lower back pain noted, but able to ambulates, pain is not well controlled, started Norco 5/325mg  bid subsequently. X-ray 05/16/23 lumbar spine, pelvis, hips showed no acute abnormality.   Obtain X-ray thoracic spine, Robaxin 500mg  q8hr prn x 72 hours.   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   Slow transit constipation Stable,  takes [employer] staff Communication: plan of care reviewed with the patient and charge nurse.   Labs/tests ordered:  X-ray thoracic spine  Time spend 40 minutes

## 2023-05-17 NOTE — Assessment & Plan Note (Signed)
05/16/23 X-ray no acute abnormality L spine, hips, pelvis,

## 2023-05-17 NOTE — Assessment & Plan Note (Signed)
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

## 2023-05-17 NOTE — Assessment & Plan Note (Signed)
takes Vit B, Vit B12 942

## 2023-05-17 NOTE — Assessment & Plan Note (Signed)
mid to lower back pain, lower mid back tenderness on the spinal spinous processes noted, no difficulty breathing.    Larey Seat 05/09/23 when the patient tripped over the entrance of dinning room, some lower back pain noted, but able to ambulates, pain is not well controlled, started Norco 5/325mg  bid subsequently. X-ray 05/16/23 lumbar spine, pelvis, hips showed no acute abnormality.   Obtain X-ray thoracic spine, Robaxin 500mg  q8hr prn x 72 hours.   05/17/23 X-ray thoracic spine no compression deformities.   05/20/23 desires Norco 5/325mg  bid, Tylenol 650mg  tid, Robaxine 500mg  q8h prn x 2 weeks.

## 2023-05-17 NOTE — Assessment & Plan Note (Signed)
failed Myrbetriq lack of efficacy and pricey

## 2023-05-17 NOTE — Assessment & Plan Note (Signed)
supplemented, takes Kcl, serum K 3.7 03/20/22

## 2023-05-17 NOTE — Assessment & Plan Note (Signed)
takes Venlafaxine, stable.

## 2023-05-18 DIAGNOSIS — M6281 Muscle weakness (generalized): Secondary | ICD-10-CM | POA: Diagnosis not present

## 2023-05-18 DIAGNOSIS — R2681 Unsteadiness on feet: Secondary | ICD-10-CM | POA: Diagnosis not present

## 2023-05-18 DIAGNOSIS — R41841 Cognitive communication deficit: Secondary | ICD-10-CM | POA: Diagnosis not present

## 2023-05-18 DIAGNOSIS — R0602 Shortness of breath: Secondary | ICD-10-CM | POA: Diagnosis not present

## 2023-05-20 ENCOUNTER — Encounter: Payer: Self-pay | Admitting: Nurse Practitioner

## 2023-05-20 DIAGNOSIS — R41841 Cognitive communication deficit: Secondary | ICD-10-CM | POA: Diagnosis not present

## 2023-05-20 DIAGNOSIS — M6281 Muscle weakness (generalized): Secondary | ICD-10-CM | POA: Diagnosis not present

## 2023-05-20 DIAGNOSIS — R0602 Shortness of breath: Secondary | ICD-10-CM | POA: Diagnosis not present

## 2023-05-20 DIAGNOSIS — R2681 Unsteadiness on feet: Secondary | ICD-10-CM | POA: Diagnosis not present

## 2023-05-22 DIAGNOSIS — R2681 Unsteadiness on feet: Secondary | ICD-10-CM | POA: Diagnosis not present

## 2023-05-22 DIAGNOSIS — R41841 Cognitive communication deficit: Secondary | ICD-10-CM | POA: Diagnosis not present

## 2023-05-22 DIAGNOSIS — R0602 Shortness of breath: Secondary | ICD-10-CM | POA: Diagnosis not present

## 2023-05-22 DIAGNOSIS — M6281 Muscle weakness (generalized): Secondary | ICD-10-CM | POA: Diagnosis not present

## 2023-05-24 DIAGNOSIS — R41841 Cognitive communication deficit: Secondary | ICD-10-CM | POA: Diagnosis not present

## 2023-05-24 DIAGNOSIS — R0602 Shortness of breath: Secondary | ICD-10-CM | POA: Diagnosis not present

## 2023-05-24 DIAGNOSIS — M6281 Muscle weakness (generalized): Secondary | ICD-10-CM | POA: Diagnosis not present

## 2023-05-24 DIAGNOSIS — R2681 Unsteadiness on feet: Secondary | ICD-10-CM | POA: Diagnosis not present

## 2023-05-27 DIAGNOSIS — R41841 Cognitive communication deficit: Secondary | ICD-10-CM | POA: Diagnosis not present

## 2023-05-27 DIAGNOSIS — M6281 Muscle weakness (generalized): Secondary | ICD-10-CM | POA: Diagnosis not present

## 2023-05-27 DIAGNOSIS — R0602 Shortness of breath: Secondary | ICD-10-CM | POA: Diagnosis not present

## 2023-05-27 DIAGNOSIS — R2681 Unsteadiness on feet: Secondary | ICD-10-CM | POA: Diagnosis not present

## 2023-05-28 DIAGNOSIS — R41841 Cognitive communication deficit: Secondary | ICD-10-CM | POA: Diagnosis not present

## 2023-05-28 DIAGNOSIS — M6281 Muscle weakness (generalized): Secondary | ICD-10-CM | POA: Diagnosis not present

## 2023-05-28 DIAGNOSIS — R2681 Unsteadiness on feet: Secondary | ICD-10-CM | POA: Diagnosis not present

## 2023-05-28 DIAGNOSIS — R0602 Shortness of breath: Secondary | ICD-10-CM | POA: Diagnosis not present

## 2023-05-29 ENCOUNTER — Non-Acute Institutional Stay (SKILLED_NURSING_FACILITY): Payer: Self-pay | Admitting: Nurse Practitioner

## 2023-05-29 ENCOUNTER — Encounter: Payer: Self-pay | Admitting: Nurse Practitioner

## 2023-05-29 DIAGNOSIS — K5641 Fecal impaction: Secondary | ICD-10-CM | POA: Diagnosis not present

## 2023-05-29 DIAGNOSIS — Z933 Colostomy status: Secondary | ICD-10-CM | POA: Diagnosis not present

## 2023-05-29 DIAGNOSIS — E538 Deficiency of other specified B group vitamins: Secondary | ICD-10-CM

## 2023-05-29 DIAGNOSIS — R195 Other fecal abnormalities: Secondary | ICD-10-CM | POA: Diagnosis not present

## 2023-05-29 DIAGNOSIS — M549 Dorsalgia, unspecified: Secondary | ICD-10-CM

## 2023-05-29 DIAGNOSIS — I63342 Cerebral infarction due to thrombosis of left cerebellar artery: Secondary | ICD-10-CM | POA: Diagnosis not present

## 2023-05-29 DIAGNOSIS — N3941 Urge incontinence: Secondary | ICD-10-CM | POA: Diagnosis not present

## 2023-05-29 DIAGNOSIS — K5901 Slow transit constipation: Secondary | ICD-10-CM

## 2023-05-29 DIAGNOSIS — E785 Hyperlipidemia, unspecified: Secondary | ICD-10-CM

## 2023-05-29 DIAGNOSIS — F339 Major depressive disorder, recurrent, unspecified: Secondary | ICD-10-CM | POA: Diagnosis not present

## 2023-05-29 DIAGNOSIS — R41841 Cognitive communication deficit: Secondary | ICD-10-CM | POA: Diagnosis not present

## 2023-05-29 DIAGNOSIS — M6281 Muscle weakness (generalized): Secondary | ICD-10-CM | POA: Diagnosis not present

## 2023-05-29 DIAGNOSIS — R0602 Shortness of breath: Secondary | ICD-10-CM | POA: Diagnosis not present

## 2023-05-29 DIAGNOSIS — R2681 Unsteadiness on feet: Secondary | ICD-10-CM | POA: Diagnosis not present

## 2023-05-29 DIAGNOSIS — I10 Essential (primary) hypertension: Secondary | ICD-10-CM

## 2023-05-29 NOTE — Assessment & Plan Note (Signed)
Mid to lower back pain, lower mid back tenderness on the spinal spinous processes noted, no difficulty breathing.               Larey Seat 05/09/23 when the patient tripped over the entrance of dinning room, some lower back pain noted, but able to ambulates, pain is not well controlled, started Norco 5/325mg  bid subsequently. X-ray 05/16/23 lumbar spine, pelvis, hips showed no acute abnormality. Norco is effective x2 weeks from 05/20/23

## 2023-05-29 NOTE — Assessment & Plan Note (Signed)
due to diverticulitis with distal sigmoid obstruction, s/p Hartman's procedure, splenic flexure mobilization

## 2023-05-29 NOTE — Assessment & Plan Note (Signed)
failed Myrbetriq lack of efficacy and pricey

## 2023-05-29 NOTE — Progress Notes (Signed)
Location:   [facility] Room Number: 913 Place of Service:  ALF [phone]) Provider: [facility] Shyheem Whitham NP  Mahlon Gammon, MD  Patient Care Team: Mahlon Gammon, MD as PCP - General (Internal Medicine) Verner Chol, [facility] (Inactive) as Pharmacist (Pharmacist)  Extended Emergency Contact Information Primary Emergency Contact: Julio Sicks [place] Phone: [phone] Relation: Daughter  Code Status: DNR Goals of care: Advanced Directive information    05/17/2023    9:46 AM  Advanced Directives  Does Patient Have a Medical Advance Directive? Yes  Type of Advance Directive Living will;Healthcare Power of Attorney  Does patient want to make changes to medical advance directive? No - Patient declined  Copy of Healthcare Power of Attorney in Chart? Yes - validated most recent copy scanned in chart (See row information)     Chief Complaint  Patient presents with   Acute Visit    Constipation, f/u back pain.     HPI:  Pt is a 89 y.o. female seen today for an acute visit for constipation, colostomy bag with less stools than usual, also the patient she feels bloated, growling/rumbling bowel sounds noted, no abd pain, nausea, vomiting, agreed to schedule MiraLax bid, obtain X-ray of abd.    Mid to lower back pain, lower mid back tenderness on the spinal spinous processes noted, no difficulty breathing.               Larey Seat 05/09/23 when the patient tripped over the entrance of dinning room, some lower back pain noted, but able to ambulates, pain is not well controlled, started Norco 5/325mg  bid subsequently. X-ray 05/16/23 lumbar spine, pelvis, hips showed no acute abnormality. Norco is effective.              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, s/p Hartman's procedure, splenic flexure mobilization              HTN takes Amlodipine Bun/creat 18/0.7 03/20/22              Hypomagnesemia, Mg 1.8 12/20/20             Vit B12 deficiency, takes Vit B, Vit B12 942             Hx of CVA w/o residual deficit             Hyperlipidemia, takes Rosuvastatin, LDL 72 12/20/20             Depression, takes Venlafaxine             Constipation, takes MiraLax              Post op anemia, off Fe Hgb 13.1 04/17/22             Hypokalemia, supplemented, takes Kcl, serum K 3.7 03/20/22             Allergic rhinitis, takes Flonase              OA, mid to lower back, takes Norco      Past Medical History:  Diagnosis Date   Arthritis    Depression    Hyperlipidemia    Osteopenia    Stroke (HCC)    Thrombocytopenia (HCC)    Vitamin D deficiency    Wrist fracture 2002   left   Past Surgical History:  Procedure  Laterality Date   APPENDECTOMY  1967   CARPAL TUNNEL RELEASE  2008   right   COLON RESECTION SIGMOID N/A 12/03/2020   Procedure: EXPLORATORY LAPAROTOMY; HARTMAN'S PROCEDURE; SPLEENIC FLEXURE MOBILIZATION;  Surgeon: Romie Levee, MD;  Location: [facility];  Service: General;  Laterality: N/A;   COLOSTOMY N/A 12/03/2020   Procedure: COLOSTOMY;  Surgeon: Romie Levee, MD;  Location: [facility];  Service: General;  Laterality: N/A;   JOINT REPLACEMENT  2002   right total     Allergies  Allergen Reactions   Bactrim [Sulfamethoxazole-Trimethoprim] Nausea Only   Ditropan [Oxybutynin]     Made patient feel bad   Sulfa Antibiotics     Other reaction(s): stomach upset    Allergies as of 05/29/2023       Reactions   Bactrim [sulfamethoxazole-trimethoprim] Nausea Only   Ditropan [oxybutynin]    Made patient feel bad   Sulfa Antibiotics    Other reaction(s): stomach upset        Medication List        Accurate as of May 29, 2023 11:59 PM. If you have any questions, ask your nurse or doctor.          amLODipine 5 MG tablet Commonly known as: NORVASC Take 5 mg by mouth daily.   ANTACID & ANTIGAS PO Take 30 mLs by mouth every 4 (four) hours as  needed. for Indigestion   aspirin EC 81 MG tablet Take 81 mg by mouth daily. Swallow whole.   calcium carbonate 1500 (600 Ca) MG Tabs tablet Commonly known as: OSCAL Take 600 mg of elemental calcium by mouth daily.   cyanocobalamin 1000 MCG tablet Commonly known as: VITAMIN B12 Take 1,000 mcg by mouth daily.   fluticasone 50 MCG/ACT nasal spray Commonly known as: FLONASE Place 1 spray into both nostrils 2 (two) times daily.   HYDROcodone-acetaminophen 5-325 MG tablet Commonly known as: NORCO/VICODIN Take 0.5 tablets by mouth 2 (two) times daily.   loperamide 2 MG tablet Commonly known as: IMODIUM A-D Take 2 mg by mouth as needed for diarrhea or loose stools.   Magnesium 300 MG Caps Take 300 mg by mouth daily.   magnesium hydroxide 400 MG/5ML suspension Commonly known as: MILK OF MAGNESIA Take 30 mLs by mouth daily as needed for mild constipation.   minoxidil 2 % external solution Commonly known as: ROGAINE Apply topically daily as needed.   polyethylene glycol 17 g packet Commonly known as: MIRALAX / GLYCOLAX Take 17 g by mouth daily.   potassium chloride SA 20 MEQ tablet Commonly known as: KLOR-CON M Take 20 mEq by mouth daily.   rosuvastatin 10 MG tablet Commonly known as: CRESTOR TAKE 1 TABLET ONCE DAILY.   simethicone 80 MG chewable tablet Commonly known as: MYLICON Chew 80 mg by mouth 4 (four) times daily as needed.   SYSTANE BALANCE OP Apply 1 drop to eye in the morning and at bedtime. related to PRESENCE OF INTRAOCULAR LENS (Z96.1) wait 10 minutes between Systane and timolol drops   timolol 0.5 % ophthalmic solution Commonly known as: TIMOPTIC Place 1 drop into the left eye 2 (two) times daily. related to PRESENCE OF INTRAOCULAR LENS (Z96.1) Wait 10 minutes between Systane and timolol drops   venlafaxine XR 75 MG 24 hr capsule Commonly known as: EFFEXOR-XR TAKE 2 CAPSULES (150MG ) BY MOUTH DAILY.   Vitamin D3 50 MCG (2000 UT) capsule Take 2,000  Units by mouth daily.   Voltaren Arthritis Pain 1 % Gel Generic drug: diclofenac  Sodium Apply 2 g topically 3 (three) times daily as needed (Left wrist).        Review of Systems  Constitutional:  Negative for appetite change, fatigue and fever.  HENT:  Negative for congestion, hearing loss and rhinorrhea.   Eyes:  Negative for visual disturbance.  Respiratory:  Negative for cough, shortness of breath and wheezing.   Cardiovascular:  Positive for leg swelling.  Gastrointestinal:  Positive for constipation. Negative for abdominal distention, abdominal pain, nausea and vomiting.  Genitourinary:  Positive for frequency. Negative for dysuria and urgency.  Musculoskeletal:  Positive for arthralgias, back pain and gait problem.       Left knee pain is chronic. Lower back pain mid to lower noted since fall 05/09/23  Skin:  Negative for color change.  Neurological:  Negative for speech difficulty, weakness and light-headedness.  Psychiatric/Behavioral:  Negative for confusion and sleep disturbance. The patient is not nervous/anxious.     Immunization History  Administered Date(s) Administered   Fluad Quad(high Dose 65+) 06/13/2019, 07/02/2022   Influenza Split 06/18/2012   Influenza Whole 07/11/2010   Influenza, High Dose Seasonal PF 07/07/2013, 07/14/2014, 06/24/2015, 06/13/2016, 06/04/2017, 06/17/2018   Influenza-Unspecified 06/22/2020, 06/29/2021   Moderna Covid-19 Vaccine Bivalent Booster 59yrs & up 01/26/2022   Moderna SARS-COV2 Booster Vaccination 07/19/2020, 02/07/2021   Moderna Sars-Covid-2 Vaccination 09/14/2019, 10/12/2019   PFIZER(Purple Top)SARS-COV-2 Vaccination 05/30/2021   Pfizer Covid-19 Vaccine Bivalent Booster 15yrs & up 07/12/2022   Pneumococcal Conjugate-13 04/27/2014, 08/22/2021   Pneumococcal Polysaccharide-23 10/15/2017   Td 09/10/2005   Zoster Recombinant(Shingrix) 08/22/2021, 10/25/2021, 02/28/2022   Zoster, Live 11/29/2006   Pertinent  Health Maintenance  Due  Topic Date Due   INFLUENZA VACCINE  04/11/2023   DEXA SCAN  Completed      12/14/2020    5:00 AM 12/14/2020    8:00 AM 12/14/2020    8:29 PM 12/15/2020    8:44 AM 10/02/2022    9:39 AM  Fall Risk  Falls in the past year?     0  Was there an injury with Fall?     0  Fall Risk Category Calculator     0  (RETIRED) Patient Fall Risk Level Moderate fall risk Moderate fall risk Moderate fall risk High fall risk   Patient at Risk for Falls Due to     No Fall Risks  Fall risk Follow up     Falls evaluation completed   Functional Status Survey:    Vitals:   05/29/23 1314  BP: 136/70  Pulse: 77  Resp: 18  Temp: 98.1 F (36.7 C)  SpO2: 97%  Weight: 190 lb 9.6 oz (86.5 kg)   Body mass index is 33.76 kg/m. Physical Exam Vitals and nursing note reviewed.  Constitutional:      Appearance: Normal appearance.  HENT:     Head: Normocephalic and atraumatic.     Nose: Nose normal.     Mouth/Throat:     Mouth: Mucous membranes are moist.  Eyes:     Extraocular Movements: Extraocular movements intact.     Conjunctiva/sclera: Conjunctivae normal.     Pupils: Pupils are equal, round, and reactive to light.  Cardiovascular:     Rate and Rhythm: Normal rate and regular rhythm.     Heart sounds: No murmur heard. Pulmonary:     Effort: Pulmonary effort is normal.     Breath sounds: No rales.  Abdominal:     General: Bowel sounds are normal.     Palpations: Abdomen  is soft.     Tenderness: There is no abdominal tenderness. There is no right CVA tenderness, left CVA tenderness, guarding or rebound.     Comments: Colostomy, stool is pasty appearance, usually liquidly look, in the colostomy bag, feel bloated, hyper bowl sound noted  Musculoskeletal:        General: Tenderness present.     Cervical back: Normal range of motion and neck supple.     Right lower leg: Edema present.     Left lower leg: Edema present.     Comments: Trace edema in ankles. C/o pain in mid to lower back, lower mid  spinal spinous process tenderness noted, no SIJ tenderness, or groin pain palpated. Able to ambulate with walker.   Skin:    General: Skin is warm and dry.     Comments: Mid abd surgical scar. Colostomy is functioning.   Neurological:     General: No focal deficit present.     Mental Status: She is alert and oriented to person, place, and time. Mental status is at baseline.     Gait: Gait abnormal.  Psychiatric:        Mood and Affect: Mood normal.        Behavior: Behavior normal.        Thought Content: Thought content normal.        Judgment: Judgment normal.     Labs reviewed: No results for input(s): "NA", "K", "CL", "CO2", "GLUCOSE", "BUN", "CREATININE", "CALCIUM", "MG", "PHOS" in the last 8760 hours. No results for input(s): "AST", "ALT", "ALKPHOS", "BILITOT", "PROT", "ALBUMIN" in the last 8760 hours. No results for input(s): "WBC", "NEUTROABS", "HGB", "HCT", "MCV", "PLT" in the last 8760 hours. Lab Results  Component Value Date   TSH 1.40 12/21/2020   Lab Results  Component Value Date   HGBA1C 5.9 (H) 09/26/2015   Lab Results  Component Value Date   CHOL 136 12/21/2020   HDL 93 (A) 12/21/2020   LDLCALC 72 12/21/2020   LDLDIRECT 147.0 11/17/2012   TRIG 119 12/21/2020   CHOLHDL 3 02/10/2020    Significant Diagnostic Results in last 30 days:  No results found.  Assessment/Plan: Slow transit constipation constipation, colostomy bag with less stools than usual, also the patient she feels bloated, growling/rumbling bowel sounds noted, no abd pain, nausea, vomiting, agreed to schedule MiraLax bid, obtain X-ray of abd.  05/29/23 KUB significant fecal material than normal involving the distal colon.   Mild back pain Mid to lower back pain, lower mid back tenderness on the spinal spinous processes noted, no difficulty breathing.               Larey Seat 05/09/23 when the patient tripped over the entrance of dinning room, some lower back pain noted, but able to ambulates, pain is  not well controlled, started Norco 5/325mg  bid subsequently. X-ray 05/16/23 lumbar spine, pelvis, hips showed no acute abnormality. Norco is effective x2 weeks from 05/20/23  Urine incontinence failed Myrbetriq lack of efficacy and pricey  Colostomy status (HCC) due to diverticulitis with distal sigmoid obstruction, s/p Hartman's procedure, splenic flexure mobilization   Essential hypertension takes Amlodipine Bun/creat 18/0.7 03/20/22, update CMP/eGFR  Vitamin B12 deficiency  takes Vit B, Vit B12 942, update CBC/diff,   Cerebral infarction (HCC) w/o residual deficit  Hyperlipidemia LDL goal <70 takes Rosuvastatin, LDL 72 12/20/20  Depression, recurrent (HCC) Her mood is stable, takes Venlafaxine, obtain TSH    Family/ staff Communication: plan of care reviewed with the patient and charge  nurse.   Labs/tests ordered:  CBC/diff, CMP/eGFR, lipid panel, TSH, X-ry KUB  Time spend 40 minutes.

## 2023-05-29 NOTE — Assessment & Plan Note (Signed)
w/o residual deficit ?

## 2023-05-29 NOTE — Assessment & Plan Note (Signed)
constipation, colostomy bag with less stools than usual, also the patient she feels bloated, growling/rumbling bowel sounds noted, no abd pain, nausea, vomiting, agreed to schedule MiraLax bid, obtain X-ray of abd.  05/29/23 KUB significant fecal material than normal involving the distal colon.

## 2023-05-29 NOTE — Assessment & Plan Note (Signed)
takes Vit B, Vit B12 942, update CBC/diff,

## 2023-05-29 NOTE — Assessment & Plan Note (Signed)
takes Amlodipine Bun/creat 18/0.7 03/20/22, update CMP/eGFR

## 2023-05-29 NOTE — Assessment & Plan Note (Signed)
Her mood is stable, takes Venlafaxine, obtain TSH

## 2023-05-29 NOTE — Assessment & Plan Note (Signed)
takes Rosuvastatin, LDL 72 12/20/20

## 2023-05-30 DIAGNOSIS — A0472 Enterocolitis due to Clostridium difficile, not specified as recurrent: Secondary | ICD-10-CM | POA: Diagnosis not present

## 2023-05-30 DIAGNOSIS — I10 Essential (primary) hypertension: Secondary | ICD-10-CM | POA: Diagnosis not present

## 2023-05-30 LAB — LIPID PANEL
Cholesterol: 128 (ref 0–200)
HDL: 51 (ref 35–70)
LDL Cholesterol: 58
LDl/HDL Ratio: 2.5
Triglycerides: 106 (ref 40–160)

## 2023-05-30 LAB — COMPREHENSIVE METABOLIC PANEL
Albumin: 3.9 (ref 3.5–5.0)
Calcium: 9.4 (ref 8.7–10.7)
Globulin: 2.7
eGFR: 86

## 2023-05-30 LAB — BASIC METABOLIC PANEL
BUN: 10 (ref 4–21)
CO2: 25 — AB (ref 13–22)
Chloride: 103 (ref 99–108)
Creatinine: 0.6 (ref 0.5–1.1)
Glucose: 96
Potassium: 3.8 meq/L (ref 3.5–5.1)
Sodium: 138 (ref 137–147)

## 2023-05-30 LAB — HEPATIC FUNCTION PANEL
ALT: 13 U/L (ref 7–35)
AST: 16 (ref 13–35)
Alkaline Phosphatase: 72 (ref 25–125)
Bilirubin, Total: 0.7

## 2023-05-30 LAB — CBC AND DIFFERENTIAL
HCT: 39 (ref 36–46)
Hemoglobin: 12.9 (ref 12.0–16.0)
Neutrophils Absolute: 3434
Platelets: 125 10*3/uL — AB (ref 150–400)
WBC: 5.4

## 2023-05-30 LAB — CBC: RBC: 4.19 (ref 3.87–5.11)

## 2023-05-30 LAB — TSH: TSH: 1.43 (ref 0.41–5.90)

## 2023-05-31 DIAGNOSIS — R2681 Unsteadiness on feet: Secondary | ICD-10-CM | POA: Diagnosis not present

## 2023-05-31 DIAGNOSIS — R0602 Shortness of breath: Secondary | ICD-10-CM | POA: Diagnosis not present

## 2023-05-31 DIAGNOSIS — M6281 Muscle weakness (generalized): Secondary | ICD-10-CM | POA: Diagnosis not present

## 2023-05-31 DIAGNOSIS — R41841 Cognitive communication deficit: Secondary | ICD-10-CM | POA: Diagnosis not present

## 2023-06-03 DIAGNOSIS — R41841 Cognitive communication deficit: Secondary | ICD-10-CM | POA: Diagnosis not present

## 2023-06-03 DIAGNOSIS — M6281 Muscle weakness (generalized): Secondary | ICD-10-CM | POA: Diagnosis not present

## 2023-06-03 DIAGNOSIS — R0602 Shortness of breath: Secondary | ICD-10-CM | POA: Diagnosis not present

## 2023-06-03 DIAGNOSIS — R2681 Unsteadiness on feet: Secondary | ICD-10-CM | POA: Diagnosis not present

## 2023-06-04 DIAGNOSIS — R0602 Shortness of breath: Secondary | ICD-10-CM | POA: Diagnosis not present

## 2023-06-04 DIAGNOSIS — R2681 Unsteadiness on feet: Secondary | ICD-10-CM | POA: Diagnosis not present

## 2023-06-04 DIAGNOSIS — M6281 Muscle weakness (generalized): Secondary | ICD-10-CM | POA: Diagnosis not present

## 2023-06-04 DIAGNOSIS — R41841 Cognitive communication deficit: Secondary | ICD-10-CM | POA: Diagnosis not present

## 2023-06-05 DIAGNOSIS — R0602 Shortness of breath: Secondary | ICD-10-CM | POA: Diagnosis not present

## 2023-06-05 DIAGNOSIS — R2681 Unsteadiness on feet: Secondary | ICD-10-CM | POA: Diagnosis not present

## 2023-06-05 DIAGNOSIS — R41841 Cognitive communication deficit: Secondary | ICD-10-CM | POA: Diagnosis not present

## 2023-06-05 DIAGNOSIS — M6281 Muscle weakness (generalized): Secondary | ICD-10-CM | POA: Diagnosis not present

## 2023-06-06 ENCOUNTER — Encounter: Payer: Self-pay | Admitting: Nurse Practitioner

## 2023-06-06 ENCOUNTER — Non-Acute Institutional Stay: Payer: Medicare Other | Admitting: Nurse Practitioner

## 2023-06-06 ENCOUNTER — Telehealth: Payer: Self-pay | Admitting: Family

## 2023-06-06 DIAGNOSIS — D500 Iron deficiency anemia secondary to blood loss (chronic): Secondary | ICD-10-CM | POA: Diagnosis not present

## 2023-06-06 DIAGNOSIS — R918 Other nonspecific abnormal finding of lung field: Secondary | ICD-10-CM | POA: Diagnosis not present

## 2023-06-06 DIAGNOSIS — K5901 Slow transit constipation: Secondary | ICD-10-CM | POA: Diagnosis not present

## 2023-06-06 DIAGNOSIS — Z933 Colostomy status: Secondary | ICD-10-CM

## 2023-06-06 DIAGNOSIS — U071 COVID-19: Secondary | ICD-10-CM | POA: Insufficient documentation

## 2023-06-06 DIAGNOSIS — J849 Interstitial pulmonary disease, unspecified: Secondary | ICD-10-CM | POA: Diagnosis not present

## 2023-06-06 DIAGNOSIS — E538 Deficiency of other specified B group vitamins: Secondary | ICD-10-CM | POA: Diagnosis not present

## 2023-06-06 DIAGNOSIS — E876 Hypokalemia: Secondary | ICD-10-CM

## 2023-06-06 DIAGNOSIS — F339 Major depressive disorder, recurrent, unspecified: Secondary | ICD-10-CM | POA: Diagnosis not present

## 2023-06-06 DIAGNOSIS — I63342 Cerebral infarction due to thrombosis of left cerebellar artery: Secondary | ICD-10-CM

## 2023-06-06 DIAGNOSIS — M159 Polyosteoarthritis, unspecified: Secondary | ICD-10-CM

## 2023-06-06 DIAGNOSIS — I10 Essential (primary) hypertension: Secondary | ICD-10-CM

## 2023-06-06 DIAGNOSIS — J9809 Other diseases of bronchus, not elsewhere classified: Secondary | ICD-10-CM | POA: Diagnosis not present

## 2023-06-06 DIAGNOSIS — J1282 Pneumonia due to coronavirus disease 2019: Secondary | ICD-10-CM

## 2023-06-06 DIAGNOSIS — E785 Hyperlipidemia, unspecified: Secondary | ICD-10-CM | POA: Diagnosis not present

## 2023-06-06 DIAGNOSIS — M1990 Unspecified osteoarthritis, unspecified site: Secondary | ICD-10-CM | POA: Insufficient documentation

## 2023-06-06 NOTE — Assessment & Plan Note (Signed)
Blood pressure is controlled, takes Amlodipine Bun/creat 10/0.62 05/30/23

## 2023-06-06 NOTE — Telephone Encounter (Signed)
[facility] facility nurse called states patient's X-ray showed atelectasis and possible pneumonia.  Z-Pak 250 mg tablet take 2 tablets x 1 dose then 1 tablets daily x 4 days ordered.

## 2023-06-06 NOTE — Assessment & Plan Note (Signed)
takes [employer]

## 2023-06-06 NOTE — Assessment & Plan Note (Signed)
supplemented, takes Kcl, serum K 3.8 05/30/23

## 2023-06-06 NOTE — Assessment & Plan Note (Signed)
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

## 2023-06-06 NOTE — Progress Notes (Addendum)
Location:  [facility] Room Number: AL [id number]-A Place of Service:  ALF [phone]) Provider:  Chipper Oman, NP  Patient Care Team: Mahlon Gammon, MD as PCP - General (Internal Medicine) Verner Chol, [facility] (Inactive) as Pharmacist (Pharmacist)  Extended Emergency Contact Information Primary Emergency Contact: Julio Sicks [place] Phone: [phone] Relation: Daughter  Code Status:  Full Code Goals of care: Advanced Directive information    06/06/2023    2:22 PM  Advanced Directives  Does Patient Have a Medical Advance Directive? Yes  Type of Advance Directive Living will;Healthcare Power of Attorney  Does patient want to make changes to medical advance directive? No - Patient declined  Copy of Healthcare Power of Attorney in Chart? Yes - validated most recent copy scanned in chart (See row information)     Chief Complaint  Patient presents with   Acute Visit    Covid    HPI:  Pt is a 89 y.o. female seen today for COVID tested positive, nasal congestion, poor appetite, scratchy throat, SOB x 2 days, denied cough, chest pain, palpitation, no O2 desaturation, afebrile.                             Larey Seat 05/09/23 when the patient tripped over the entrance of dinning room, mid to lower back pain is better controlled, X-ray 05/16/23 lumbar spine, pelvis, hips showed no acute abnormality. Norco is effective.              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, s/p Hartman's procedure, splenic flexure mobilization              HTN takes Amlodipine Bun/creat 10/0.62 05/30/23             Hypomagnesemia, Mg 1.8 12/20/20             Vit B12 deficiency, takes Vit B, Vit B12 942             Hx of CVA w/o residual deficit             Hyperlipidemia, takes Rosuvastatin, LDL 72 12/20/20             Depression, takes Venlafaxine             Constipation, takes MiraLax  bid             Post op anemia, off Fe Hgb 12.9 05/30/23             Hypokalemia, supplemented, takes Kcl, serum K 3.8 05/30/23             Allergic rhinitis, takes Flonase              OA, mid to lower back, takes Norco     Past Medical History:  Diagnosis Date   Arthritis    Depression    Hyperlipidemia    Osteopenia    Stroke (HCC)    Thrombocytopenia (HCC)    Vitamin D deficiency    Wrist fracture 2002   left   Past Surgical History:  Procedure Laterality Date   APPENDECTOMY  1967   CARPAL TUNNEL RELEASE  2008   right   COLON RESECTION SIGMOID N/A 12/03/2020   Procedure: EXPLORATORY LAPAROTOMY; HARTMAN'S PROCEDURE; SPLEENIC FLEXURE MOBILIZATION;  Surgeon: Romie Levee,  MD;  Location: [facility];  Service: General;  Laterality: N/A;   COLOSTOMY N/A 12/03/2020   Procedure: COLOSTOMY;  Surgeon: Romie Levee, MD;  Location: [facility];  Service: General;  Laterality: N/A;   JOINT REPLACEMENT  2002   right total     Allergies  Allergen Reactions   Bactrim [Sulfamethoxazole-Trimethoprim] Nausea Only   Ditropan [Oxybutynin]     Made patient feel bad   Sulfa Antibiotics     Other reaction(s): stomach upset    Outpatient Encounter Medications as of 06/06/2023  Medication Sig   acetaminophen (TYLENOL) 325 MG tablet Take 650 mg by mouth every 6 (six) hours as needed for fever.   acetaminophen (TYLENOL) 325 MG tablet Take 650 mg by mouth 3 (three) times daily. SCHEDULED, See as needed dosing also   Alum & Mag Hydroxide-Simeth (ANTACID & ANTIGAS PO) Take 30 mLs by mouth every 4 (four) hours as needed. for Indigestion   amLODipine (NORVASC) 5 MG tablet Take 5 mg by mouth daily.   aspirin EC 81 MG tablet Take 81 mg by mouth daily. Swallow whole.   calcium carbonate (OSCAL) 1500 (600 Ca) MG TABS tablet Take 600 mg of elemental calcium by mouth daily.   Cholecalciferol (VITAMIN D3) 2000 units capsule Take 2,000 Units by mouth daily.   diclofenac Sodium (VOLTAREN ARTHRITIS PAIN) 1 % GEL  Apply 2 g topically 3 (three) times daily as needed (Left wrist).   fluticasone (FLONASE) 50 MCG/ACT nasal spray Place 1 spray into both nostrils 2 (two) times daily.   guaiFENesin (MUCINEX) 600 MG 12 hr tablet Take 600 mg by mouth 2 (two) times daily.   HYDROcodone-acetaminophen (NORCO/VICODIN) 5-325 MG tablet Take 0.5 tablets by mouth daily. As needed for pain   lidocaine 4 % Place 1 patch onto the skin daily. Apply at bedtime and remove in the am   loperamide (IMODIUM A-D) 2 MG tablet Take 2 mg by mouth as needed for diarrhea or loose stools.   Magnesium 300 MG CAPS Take 300 mg by mouth daily.   magnesium hydroxide (MILK OF MAGNESIA) 400 MG/5ML suspension Take 30 mLs by mouth daily as needed for mild constipation.   minoxidil (ROGAINE) 2 % external solution Apply topically daily as needed.   nirmatrelvir & ritonavir (PAXLOVID, 150/100,) 10 x 150 MG & 10 x 100MG  TBPK Take 3 tablets by mouth 2 (two) times daily.   polyethylene glycol (MIRALAX / GLYCOLAX) 17 g packet Take 17 g by mouth daily.   potassium chloride SA (KLOR-CON) 20 MEQ tablet Take 20 mEq by mouth daily.   Propylene Glycol (SYSTANE BALANCE OP) Apply 1 drop to eye in the morning and at bedtime. related to PRESENCE OF INTRAOCULAR LENS (Z96.1) wait 10 minutes between Systane and timolol drops   rosuvastatin (CRESTOR) 10 MG tablet TAKE 1 TABLET ONCE DAILY.   simethicone (MYLICON) 80 MG chewable tablet Chew 40 mg by mouth 4 (four) times daily as needed.   timolol (TIMOPTIC) 0.5 % ophthalmic solution Place 1 drop into the left eye 2 (two) times daily. related to PRESENCE OF INTRAOCULAR LENS (Z96.1) Wait 10 minutes between Systane and timolol drops   venlafaxine XR (EFFEXOR-XR) 75 MG 24 hr capsule TAKE 2 CAPSULES (150MG ) BY MOUTH DAILY.   vitamin B-12 (CYANOCOBALAMIN) 1000 MCG tablet Take 1,000 mcg by mouth daily.   No facility-administered encounter medications on file as of 06/06/2023.    Review of Systems  Constitutional:  Positive  for appetite change and fatigue. Negative for fever.  HENT:  Positive for congestion. Negative for hearing loss, rhinorrhea and trouble swallowing.        Scratchy throat  Eyes:  Negative for visual disturbance.  Respiratory:  Positive for shortness of breath. Negative for cough and wheezing.   Cardiovascular:  Positive for leg swelling.  Gastrointestinal:  Negative for abdominal pain, constipation, nausea and vomiting.  Genitourinary:  Positive for frequency. Negative for dysuria and urgency.  Musculoskeletal:  Positive for arthralgias, back pain and gait problem.       Left knee pain is chronic. Lower back pain mid to lower noted since fall 05/09/23  Skin:  Negative for color change.  Neurological:  Negative for speech difficulty, weakness and light-headedness.  Psychiatric/Behavioral:  Negative for confusion and sleep disturbance. The patient is not nervous/anxious.     Immunization History  Administered Date(s) Administered   Fluad Quad(high Dose 65+) 06/13/2019, 07/02/2022   Influenza Split 06/18/2012   Influenza Whole 07/11/2010   Influenza, High Dose Seasonal PF 07/07/2013, 07/14/2014, 06/24/2015, 06/13/2016, 06/04/2017, 06/17/2018   Influenza-Unspecified 06/22/2020, 06/29/2021   Moderna Covid-19 Vaccine Bivalent Booster 69yrs & up 01/26/2022   Moderna SARS-COV2 Booster Vaccination 07/19/2020, 02/07/2021   Moderna Sars-Covid-2 Vaccination 09/14/2019, 10/12/2019   PFIZER(Purple Top)SARS-COV-2 Vaccination 05/30/2021   Pfizer Covid-19 Vaccine Bivalent Booster 50yrs & up 07/12/2022   Pneumococcal Conjugate-13 04/27/2014, 08/22/2021   Pneumococcal Polysaccharide-23 10/15/2017   Td 09/10/2005   Zoster Recombinant(Shingrix) 08/22/2021, 10/25/2021, 02/28/2022   Zoster, Live 11/29/2006   Pertinent  Health Maintenance Due  Topic Date Due   INFLUENZA VACCINE  04/11/2023   DEXA SCAN  Completed      12/14/2020    5:00 AM 12/14/2020    8:00 AM 12/14/2020    8:29 PM 12/15/2020    8:44 AM  10/02/2022    9:39 AM  Fall Risk  Falls in the past year?     0  Was there an injury with Fall?     0  Fall Risk Category Calculator     0  (RETIRED) Patient Fall Risk Level Moderate fall risk Moderate fall risk Moderate fall risk High fall risk   Patient at Risk for Falls Due to     No Fall Risks  Fall risk Follow up     Falls evaluation completed   Functional Status Survey:    Vitals:   06/06/23 1414  BP: (!) 140/80  Pulse: 96  Resp: 20  Temp: 99.3 F (37.4 C)  SpO2: 94%  Weight: 190 lb 9.6 oz (86.5 kg)  Height: 5\' 3"  (1.6 m)   Body mass index is 33.76 kg/m. Physical Exam Vitals and nursing note reviewed.  Constitutional:      Comments: fatigue  HENT:     Head: Normocephalic and atraumatic.     Nose: Congestion and rhinorrhea present.     Mouth/Throat:     Mouth: Mucous membranes are moist.     Pharynx: No oropharyngeal exudate or posterior oropharyngeal erythema.  Eyes:     Extraocular Movements: Extraocular movements intact.     Conjunctiva/sclera: Conjunctivae normal.     Pupils: Pupils are equal, round, and reactive to light.  Cardiovascular:     Rate and Rhythm: Normal rate and regular rhythm.     Heart sounds: No murmur heard. Pulmonary:     Effort: Pulmonary effort is normal. No respiratory distress.     Breath sounds: No wheezing, rhonchi or rales.  Abdominal:     General: Bowel sounds are normal.  Palpations: Abdomen is soft.     Tenderness: There is no abdominal tenderness.     Comments: Colostomy, stool is pasty appearance, usually liquidly look, in the colostomy bag, feel bloated, hyper bowl sound noted  Musculoskeletal:        General: Tenderness present.     Cervical back: Normal range of motion and neck supple.     Right lower leg: Edema present.     Left lower leg: Edema present.     Comments: Trace edema in ankles. Mid to lower back pain is better, only positional.   Skin:    General: Skin is warm and dry.     Comments: Mid abd surgical  scar. Colostomy is functioning.   Neurological:     General: No focal deficit present.     Mental Status: She is alert and oriented to person, place, and time. Mental status is at baseline.     Gait: Gait abnormal.  Psychiatric:        Mood and Affect: Mood normal.        Behavior: Behavior normal.        Thought Content: Thought content normal.        Judgment: Judgment normal.     Labs reviewed: No results for input(s): "NA", "K", "CL", "CO2", "GLUCOSE", "BUN", "CREATININE", "CALCIUM", "MG", "PHOS" in the last 8760 hours. No results for input(s): "AST", "ALT", "ALKPHOS", "BILITOT", "PROT", "ALBUMIN" in the last 8760 hours. No results for input(s): "WBC", "NEUTROABS", "HGB", "HCT", "MCV", "PLT" in the last 8760 hours. Lab Results  Component Value Date   TSH 1.40 12/21/2020   Lab Results  Component Value Date   HGBA1C 5.9 (H) 09/26/2015   Lab Results  Component Value Date   CHOL 136 12/21/2020   HDL 93 (A) 12/21/2020   LDLCALC 72 12/21/2020   LDLDIRECT 147.0 11/17/2012   TRIG 119 12/21/2020   CHOLHDL 3 02/10/2020    Significant Diagnostic Results in last 30 days:  No results found.  Assessment/Plan Essential hypertension Blood pressure is controlled, takes Amlodipine Bun/creat 10/0.62 05/30/23  Hypomagnesemia Mg 1.8 12/20/20  Vitamin B12 deficiency takes Vit B, Vit B12 942  Cerebral infarction (HCC) Hx of CVA w/o residual deficit  Hyperlipidemia LDL goal <70  takes Rosuvastatin, LDL 72 12/20/20  Depression, recurrent (HCC) Stable, takes Venlafaxine  Slow transit constipation Stable,  takes MiraLax bid  Blood loss anemia off Fe Hgb 12.9 05/30/23  Hypokalemia  supplemented, takes Kcl, serum K 3.8 05/30/23  Allergic rhinitis  takes Flonase   Osteoarthritis  mid to lower back, better, takes Norco  Colostomy status (HCC) 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  Will CXR ap/lateral, Paxlovid 300/100mg  bid x 5 days. VS q shift x 72hours.  06/06/23 CXR early changes intra hilar atelectasis or PNA, Z-pk started.      Family/ staff Communication: plan of care reviewed with the patient and charge nurse.   Labs/tests ordered: none  Time spend 40 minutes.

## 2023-06-06 NOTE — Assessment & Plan Note (Signed)
off Fe Hgb 12.9 05/30/23

## 2023-06-06 NOTE — Assessment & Plan Note (Addendum)
Stable, takes Venlafaxine

## 2023-06-06 NOTE — Assessment & Plan Note (Signed)
Stable, takes MiraLax bid.

## 2023-06-06 NOTE — Assessment & Plan Note (Addendum)
COVID tested positive, nasal congestion, poor appetite, scratchy throat, SOB x 2 days, denied cough, chest pain, palpitation, no O2 desaturation, afebrile.  Will CXR ap/lateral, Paxlovid 300/100mg  bid x 5 days. VS q shift x 72hours.  06/06/23 CXR early changes intra hilar atelectasis or PNA, Z-pk started.

## 2023-06-06 NOTE — Assessment & Plan Note (Signed)
Mg 1.8 12/20/20

## 2023-06-06 NOTE — Assessment & Plan Note (Addendum)
mid to lower back, better, takes Norco

## 2023-06-06 NOTE — Assessment & Plan Note (Signed)
takes Vit B, Vit B12 942

## 2023-06-06 NOTE — Assessment & Plan Note (Signed)
Hx of CVA w/o residual deficit

## 2023-06-06 NOTE — Assessment & Plan Note (Signed)
takes Rosuvastatin, LDL 72 12/20/20

## 2023-06-07 DIAGNOSIS — R2681 Unsteadiness on feet: Secondary | ICD-10-CM | POA: Diagnosis not present

## 2023-06-07 DIAGNOSIS — M6281 Muscle weakness (generalized): Secondary | ICD-10-CM | POA: Diagnosis not present

## 2023-06-07 DIAGNOSIS — R0602 Shortness of breath: Secondary | ICD-10-CM | POA: Diagnosis not present

## 2023-06-07 DIAGNOSIS — R41841 Cognitive communication deficit: Secondary | ICD-10-CM | POA: Diagnosis not present

## 2023-06-10 DIAGNOSIS — M6281 Muscle weakness (generalized): Secondary | ICD-10-CM | POA: Diagnosis not present

## 2023-06-10 DIAGNOSIS — R0602 Shortness of breath: Secondary | ICD-10-CM | POA: Diagnosis not present

## 2023-06-10 DIAGNOSIS — R41841 Cognitive communication deficit: Secondary | ICD-10-CM | POA: Diagnosis not present

## 2023-06-10 DIAGNOSIS — R2681 Unsteadiness on feet: Secondary | ICD-10-CM | POA: Diagnosis not present

## 2023-06-11 DIAGNOSIS — R2681 Unsteadiness on feet: Secondary | ICD-10-CM | POA: Diagnosis not present

## 2023-06-11 DIAGNOSIS — M6281 Muscle weakness (generalized): Secondary | ICD-10-CM | POA: Diagnosis not present

## 2023-06-11 DIAGNOSIS — R41841 Cognitive communication deficit: Secondary | ICD-10-CM | POA: Diagnosis not present

## 2023-06-11 DIAGNOSIS — R0602 Shortness of breath: Secondary | ICD-10-CM | POA: Diagnosis not present

## 2023-06-12 DIAGNOSIS — M6281 Muscle weakness (generalized): Secondary | ICD-10-CM | POA: Diagnosis not present

## 2023-06-12 DIAGNOSIS — R41841 Cognitive communication deficit: Secondary | ICD-10-CM | POA: Diagnosis not present

## 2023-06-12 DIAGNOSIS — R0602 Shortness of breath: Secondary | ICD-10-CM | POA: Diagnosis not present

## 2023-06-12 DIAGNOSIS — R2681 Unsteadiness on feet: Secondary | ICD-10-CM | POA: Diagnosis not present

## 2023-06-14 DIAGNOSIS — M6281 Muscle weakness (generalized): Secondary | ICD-10-CM | POA: Diagnosis not present

## 2023-06-14 DIAGNOSIS — R41841 Cognitive communication deficit: Secondary | ICD-10-CM | POA: Diagnosis not present

## 2023-06-14 DIAGNOSIS — R0602 Shortness of breath: Secondary | ICD-10-CM | POA: Diagnosis not present

## 2023-06-14 DIAGNOSIS — R2681 Unsteadiness on feet: Secondary | ICD-10-CM | POA: Diagnosis not present

## 2023-06-17 DIAGNOSIS — R0602 Shortness of breath: Secondary | ICD-10-CM | POA: Diagnosis not present

## 2023-06-17 DIAGNOSIS — M6281 Muscle weakness (generalized): Secondary | ICD-10-CM | POA: Diagnosis not present

## 2023-06-17 DIAGNOSIS — R2681 Unsteadiness on feet: Secondary | ICD-10-CM | POA: Diagnosis not present

## 2023-06-17 DIAGNOSIS — R41841 Cognitive communication deficit: Secondary | ICD-10-CM | POA: Diagnosis not present

## 2023-06-18 ENCOUNTER — Encounter: Payer: Self-pay | Admitting: Adult Health

## 2023-06-18 ENCOUNTER — Non-Acute Institutional Stay: Payer: Medicare Other | Admitting: Adult Health

## 2023-06-18 DIAGNOSIS — R2681 Unsteadiness on feet: Secondary | ICD-10-CM | POA: Diagnosis not present

## 2023-06-18 DIAGNOSIS — K219 Gastro-esophageal reflux disease without esophagitis: Secondary | ICD-10-CM

## 2023-06-18 DIAGNOSIS — K521 Toxic gastroenteritis and colitis: Secondary | ICD-10-CM | POA: Diagnosis not present

## 2023-06-18 DIAGNOSIS — R0602 Shortness of breath: Secondary | ICD-10-CM | POA: Diagnosis not present

## 2023-06-18 DIAGNOSIS — R140 Abdominal distension (gaseous): Secondary | ICD-10-CM | POA: Diagnosis not present

## 2023-06-18 DIAGNOSIS — R41841 Cognitive communication deficit: Secondary | ICD-10-CM | POA: Diagnosis not present

## 2023-06-18 DIAGNOSIS — M6281 Muscle weakness (generalized): Secondary | ICD-10-CM | POA: Diagnosis not present

## 2023-06-18 NOTE — Progress Notes (Signed)
Location:  [facility] Room Number: AL [id number] Place of Service:  ALF [phone]) Provider:  Kenard Gower, DNP, FNP-BC  Patient Care Team: Mahlon Gammon, MD as PCP - General (Internal Medicine) Verner Chol, [facility] (Inactive) as Pharmacist (Pharmacist)  Extended Emergency Contact Information Primary Emergency Contact: Julio Sicks [place] Phone: [phone] Relation: Daughter  Code Status:  Full Code  Goals of care: Advanced Directive information    06/18/2023    9:35 AM  Advanced Directives  Does Patient Have a Medical Advance Directive? Yes  Type of Advance Directive Living will;Healthcare Power of Attorney  Does patient want to make changes to medical advance directive? No - Patient declined  Copy of Healthcare Power of Attorney in Chart? Yes - validated most recent copy scanned in chart (See row information)     Chief Complaint  Patient presents with   Acute Visit    rectal discharge    HPI:  Pt is a 89 y.o. female seen today for an acute visit regarding rectal discharge. She is a resident of [facility]. She stated that she noted small amount of whitish mucoid rectal discharge. She stated that she used to be constipated and started using Miralax twice a day. She noticed that her colostomy bag gets full fast. She used to change her colostomy bag Q 3 days and now Q 3 days. She now cu down her Miralax to twice a day. She stated that she feels bloated, her abdomen tight.   Review of medications noted that she takes Simethicone QID and is self administered. Noted Simethicone bottle at bedside. She stated that she does not take it and didn't know she needs to take it. She complained of LUQ pain. She was noted to have normoactive bowel sounds and colostomy bag is watery.   Past Medical History:  Diagnosis Date   Arthritis    Depression    Hyperlipidemia    Osteopenia    Stroke (HCC)    Thrombocytopenia  (HCC)    Vitamin D deficiency    Wrist fracture 2002   left   Past Surgical History:  Procedure Laterality Date   APPENDECTOMY  1967   CARPAL TUNNEL RELEASE  2008   right   COLON RESECTION SIGMOID N/A 12/03/2020   Procedure: EXPLORATORY LAPAROTOMY; HARTMAN'S PROCEDURE; SPLEENIC FLEXURE MOBILIZATION;  Surgeon: Romie Levee, MD;  Location: [facility];  Service: General;  Laterality: N/A;   COLOSTOMY N/A 12/03/2020   Procedure: COLOSTOMY;  Surgeon: Romie Levee, MD;  Location: [facility];  Service: General;  Laterality: N/A;   JOINT REPLACEMENT  2002   right total     Allergies  Allergen Reactions   Bactrim [Sulfamethoxazole-Trimethoprim] Nausea Only   Ditropan [Oxybutynin]     Made patient feel bad   Sulfa Antibiotics     Other reaction(s): stomach upset    Outpatient Encounter Medications as of 06/18/2023  Medication Sig   acetaminophen (TYLENOL) 325 MG tablet Take 650 mg by mouth every 6 (six) hours as needed for fever.   acetaminophen (TYLENOL) 325 MG tablet Take 650 mg by mouth 3 (three) times daily. SCHEDULED, See as needed dosing also   Alum & Mag Hydroxide-Simeth (ANTACID & ANTIGAS PO) Take 30 mLs by mouth every 4 (four) hours as needed. for Indigestion   amLODipine (NORVASC) 5 MG tablet Take 5 mg by mouth daily.   aspirin EC 81 MG tablet Take 81 mg by mouth daily. Swallow whole.  calcium carbonate (OSCAL) 1500 (600 Ca) MG TABS tablet Take 600 mg of elemental calcium by mouth daily.   Cholecalciferol (VITAMIN D3) 2000 units capsule Take 2,000 Units by mouth daily.   diclofenac Sodium (VOLTAREN ARTHRITIS PAIN) 1 % GEL Apply 2 g topically 3 (three) times daily as needed (Left wrist).   fluticasone (FLONASE) 50 MCG/ACT nasal spray Place 1 spray into both nostrils 2 (two) times daily.   HYDROcodone-acetaminophen (NORCO/VICODIN) 5-325 MG tablet Take 0.5 tablets by mouth daily. As needed for pain   lidocaine 4 % Place 1 patch onto the skin daily. Apply at bedtime and remove in the am    loperamide (IMODIUM A-D) 2 MG tablet Take 2 mg by mouth as needed for diarrhea or loose stools.   Magnesium 300 MG CAPS Take 300 mg by mouth daily.   magnesium hydroxide (MILK OF MAGNESIA) 400 MG/5ML suspension Take 30 mLs by mouth daily as needed for mild constipation.   minoxidil (ROGAINE) 2 % external solution Apply topically daily as needed.   polyethylene glycol (MIRALAX / GLYCOLAX) 17 g packet Take 17 g by mouth daily.   potassium chloride SA (KLOR-CON) 20 MEQ tablet Take 20 mEq by mouth daily.   Propylene Glycol (SYSTANE BALANCE OP) Apply 1 drop to eye in the morning and at bedtime. related to PRESENCE OF INTRAOCULAR LENS (Z96.1) wait 10 minutes between Systane and timolol drops   rosuvastatin (CRESTOR) 10 MG tablet TAKE 1 TABLET ONCE DAILY.   simethicone (MYLICON) 80 MG chewable tablet Chew 40 mg by mouth 4 (four) times daily as needed.   timolol (TIMOPTIC) 0.5 % ophthalmic solution Place 1 drop into the left eye 2 (two) times daily. related to PRESENCE OF INTRAOCULAR LENS (Z96.1) Wait 10 minutes between Systane and timolol drops   venlafaxine XR (EFFEXOR-XR) 75 MG 24 hr capsule TAKE 2 CAPSULES (150MG ) BY MOUTH DAILY.   vitamin B-12 (CYANOCOBALAMIN) 1000 MCG tablet Take 1,000 mcg by mouth daily.   [DISCONTINUED] guaiFENesin (MUCINEX) 600 MG 12 hr tablet Take 600 mg by mouth 2 (two) times daily.   [DISCONTINUED] nirmatrelvir & ritonavir (PAXLOVID, 150/100,) 10 x 150 MG & 10 x 100MG  TBPK Take 3 tablets by mouth 2 (two) times daily.   No facility-administered encounter medications on file as of 06/18/2023.    Review of Systems  Constitutional:  Negative for appetite change, chills, fatigue and fever.  HENT:  Negative for congestion, hearing loss, rhinorrhea and sore throat.   Eyes: Negative.   Respiratory:  Negative for cough, shortness of breath and wheezing.   Cardiovascular:  Negative for chest pain, palpitations and leg swelling.  Gastrointestinal:  Positive for abdominal pain and  diarrhea. Negative for nausea and vomiting.  Genitourinary:  Negative for dysuria.  Musculoskeletal:  Negative for arthralgias, back pain and myalgias.  Skin:  Negative for color change, rash and wound.  Neurological:  Negative for dizziness, weakness and headaches.  Psychiatric/Behavioral:  Negative for behavioral problems. The patient is not nervous/anxious.       Immunization History  Administered Date(s) Administered   Fluad Quad(high Dose 65+) 06/13/2019, 07/02/2022   Influenza Split 06/18/2012   Influenza Whole 07/11/2010   Influenza, High Dose Seasonal PF 07/07/2013, 07/14/2014, 06/24/2015, 06/13/2016, 06/04/2017, 06/17/2018   Influenza-Unspecified 06/22/2020, 06/29/2021   Moderna Covid-19 Vaccine Bivalent Booster 48yrs & up 01/26/2022   Moderna SARS-COV2 Booster Vaccination 07/19/2020, 02/07/2021   Moderna Sars-Covid-2 Vaccination 09/14/2019, 10/12/2019   PFIZER(Purple Top)SARS-COV-2 Vaccination 05/30/2021   Pfizer Covid-19 Vaccine Bivalent Booster 69yrs &  up 07/12/2022   Pneumococcal Conjugate-13 04/27/2014, 08/22/2021   Pneumococcal Polysaccharide-23 10/15/2017   Td 09/10/2005   Zoster Recombinant(Shingrix) 08/22/2021, 10/25/2021, 02/28/2022   Zoster, Live 11/29/2006   Pertinent  Health Maintenance Due  Topic Date Due   INFLUENZA VACCINE  04/11/2023   DEXA SCAN  Completed      12/14/2020    5:00 AM 12/14/2020    8:00 AM 12/14/2020    8:29 PM 12/15/2020    8:44 AM 10/02/2022    9:39 AM  Fall Risk  Falls in the past year?     0  Was there an injury with Fall?     0  Fall Risk Category Calculator     0  (RETIRED) Patient Fall Risk Level Moderate fall risk Moderate fall risk Moderate fall risk High fall risk   Patient at Risk for Falls Due to     No Fall Risks  Fall risk Follow up     Falls evaluation completed     Vitals:   06/18/23 0925  BP: (!) 146/82  Pulse: 72  Resp: 18  Temp: 97.7 F (36.5 C)  SpO2: 97%  Weight: 179 lb 12.8 oz (81.6 kg)  Height: 5\' 3"  (1.6  m)   Body mass index is 31.85 kg/m.  Physical Exam Constitutional:      Appearance: Normal appearance.  HENT:     Head: Normocephalic and atraumatic.     Nose: Nose normal.     Mouth/Throat:     Mouth: Mucous membranes are moist.  Eyes:     Conjunctiva/sclera: Conjunctivae normal.  Cardiovascular:     Rate and Rhythm: Normal rate and regular rhythm.  Pulmonary:     Effort: Pulmonary effort is normal.     Breath sounds: Normal breath sounds.  Abdominal:     General: Bowel sounds are normal.     Palpations: Abdomen is soft.     Comments: Colostomy bag intact  Musculoskeletal:        General: Normal range of motion.     Cervical back: Normal range of motion.  Skin:    General: Skin is warm and dry.  Neurological:     General: No focal deficit present.     Mental Status: She is alert and oriented to person, place, and time.  Psychiatric:        Mood and Affect: Mood normal.        Behavior: Behavior normal.        Thought Content: Thought content normal.        Judgment: Judgment normal.        Labs reviewed: Recent Labs    05/30/23 0000  NA 138  K 3.8  CL 103  CO2 25*  BUN 10  CREATININE 0.6  CALCIUM 9.4   Recent Labs    05/30/23 0000  AST 16  ALT 13  ALKPHOS 72  ALBUMIN 3.9   Recent Labs    05/30/23 0000  WBC 5.4  NEUTROABS 3,434.00  HGB 12.9  HCT 39  PLT 125*   Lab Results  Component Value Date   TSH 1.43 05/30/2023   Lab Results  Component Value Date   HGBA1C 5.9 (H) 09/26/2015   Lab Results  Component Value Date   CHOL 128 05/30/2023   HDL 51 05/30/2023   LDLCALC 58 05/30/2023   LDLDIRECT 147.0 11/17/2012   TRIG 106 05/30/2023   CHOLHDL 3 02/10/2020    Significant Diagnostic Results in last 30 days:  No  results found.  Assessment/Plan  1. Abdominal bloating -  discussed that Simethicone will now be dispensed by the medication nurse - simethicone (MYLICON) 80 MG chewable tablet; Chew 1 tablet (80 mg total) by mouth 4  (four) times daily.  2. Gastroesophageal reflux disease without esophagitis -  will start on Protonix 40 mg daily  3. Diarrhea due to drug -  patient agreed to request for Miralax when constipated -  will change Miralax to BID PRN    Family/ staff Communication: Discussed plan of care with resident and charge nurse  Labs/tests ordered:  CBC and CMP    Kenard Gower, DNP, MSN, FNP-BC [facility] [phone] (Monday-Friday 8:00 a.m. - 5:00 p.m.) [phone] (after hours)

## 2023-06-19 DIAGNOSIS — R0602 Shortness of breath: Secondary | ICD-10-CM | POA: Diagnosis not present

## 2023-06-19 DIAGNOSIS — M6281 Muscle weakness (generalized): Secondary | ICD-10-CM | POA: Diagnosis not present

## 2023-06-19 DIAGNOSIS — R2681 Unsteadiness on feet: Secondary | ICD-10-CM | POA: Diagnosis not present

## 2023-06-19 DIAGNOSIS — R41841 Cognitive communication deficit: Secondary | ICD-10-CM | POA: Diagnosis not present

## 2023-06-20 DIAGNOSIS — I10 Essential (primary) hypertension: Secondary | ICD-10-CM | POA: Diagnosis not present

## 2023-06-21 ENCOUNTER — Non-Acute Institutional Stay (SKILLED_NURSING_FACILITY): Payer: Medicare Other | Admitting: Sports Medicine

## 2023-06-21 ENCOUNTER — Encounter: Payer: Self-pay | Admitting: Sports Medicine

## 2023-06-21 DIAGNOSIS — R109 Unspecified abdominal pain: Secondary | ICD-10-CM

## 2023-06-21 DIAGNOSIS — I10 Essential (primary) hypertension: Secondary | ICD-10-CM | POA: Diagnosis not present

## 2023-06-21 DIAGNOSIS — A0472 Enterocolitis due to Clostridium difficile, not specified as recurrent: Secondary | ICD-10-CM | POA: Diagnosis not present

## 2023-06-21 DIAGNOSIS — R0602 Shortness of breath: Secondary | ICD-10-CM | POA: Diagnosis not present

## 2023-06-21 DIAGNOSIS — R41841 Cognitive communication deficit: Secondary | ICD-10-CM | POA: Diagnosis not present

## 2023-06-21 DIAGNOSIS — F411 Generalized anxiety disorder: Secondary | ICD-10-CM | POA: Diagnosis not present

## 2023-06-21 DIAGNOSIS — M6281 Muscle weakness (generalized): Secondary | ICD-10-CM | POA: Diagnosis not present

## 2023-06-21 DIAGNOSIS — R2681 Unsteadiness on feet: Secondary | ICD-10-CM | POA: Diagnosis not present

## 2023-06-21 LAB — COMPREHENSIVE METABOLIC PANEL: Calcium: 9.2 (ref 8.7–10.7)

## 2023-06-21 LAB — CBC AND DIFFERENTIAL
HCT: 43 (ref 36–46)
Hemoglobin: 13.7 (ref 12.0–16.0)
Neutrophils Absolute: 3630
Platelets: 135 10*3/uL — AB (ref 150–400)
WBC: 6

## 2023-06-21 LAB — BASIC METABOLIC PANEL
BUN: 15 (ref 4–21)
CO2: 26 — AB (ref 13–22)
Chloride: 99 (ref 99–108)
Creatinine: 0.7 (ref 0.5–1.1)
Glucose: 99
Potassium: 3.9 meq/L (ref 3.5–5.1)
Sodium: 134 — AB (ref 137–147)

## 2023-06-21 LAB — CBC: RBC: 4.53 (ref 3.87–5.11)

## 2023-06-21 NOTE — Progress Notes (Signed)
Provider:  Oletta Lamas  Location:  [facility] Room Number: 913-A Place of Service:  ALF ([phone])  PCP: Mahlon Gammon, MD Patient Care Team: Mahlon Gammon, MD as PCP - General (Internal Medicine) Verner Chol, [facility] (Inactive) as Pharmacist (Pharmacist)  Extended Emergency Contact Information Primary Emergency Contact: Julio Sicks [place] of [place] Phone: [phone] Relation: Daughter  Code Status:  Goals of Care: Advanced Directive information    06/21/2023    4:19 PM  Advanced Directives  Does Patient Have a Medical Advance Directive? Yes  Type of Estate agent of [place];Living will  Does patient want to make changes to medical advance directive? No - Patient declined  Copy of Healthcare Power of Attorney in Chart? Yes - validated most recent copy scanned in chart (See row information)      Chief Complaint  Patient presents with   Acute Visit    Abdominal discomfort.     HPI: Patient is a 89 y.o. female seen today for acute visit for abdominal discomfort   As per nursing staff pt is complaining of abdominal discomfort for few days. She had covid recently and was on isolation. Staff reports that this has caused increased anxiety.  She was evaluated by NP yesterday for the similar problem, bmp was ordered.  Pt complains of abdominal discomfort but no pain  Denies nausea, vomiting  Tolerating PO food. Denies fevers, chills.    Past Medical History:  Diagnosis Date   Arthritis    Depression    Hyperlipidemia    Osteopenia    Stroke (HCC)    Thrombocytopenia (HCC)    Vitamin D deficiency    Wrist fracture 2002   left   Past Surgical History:  Procedure Laterality Date   APPENDECTOMY  1967   CARPAL TUNNEL RELEASE  2008   right   COLON RESECTION SIGMOID N/A 12/03/2020   Procedure: EXPLORATORY LAPAROTOMY; HARTMAN'S PROCEDURE; SPLEENIC FLEXURE MOBILIZATION;  Surgeon: Romie Levee,  MD;  Location: [facility];  Service: General;  Laterality: N/A;   COLOSTOMY N/A 12/03/2020   Procedure: COLOSTOMY;  Surgeon: Romie Levee, MD;  Location: [facility];  Service: General;  Laterality: N/A;   JOINT REPLACEMENT  2002   right total     reports that she has never smoked. She has never used smokeless tobacco. She reports that she does not drink alcohol and does not use drugs. Social History   Socioeconomic History   Marital status: Widowed    Spouse name: Not on file   Number of children: 4   Years of education: 16   Highest education level: Bachelor's degree (e.g., BA, AB, BS)  Occupational History   Not on file  Tobacco Use   Smoking status: Never   Smokeless tobacco: Never  Vaping Use   Vaping status: Never Used  Substance and Sexual Activity   Alcohol use: No   Drug use: No   Sexual activity: Not on file  Other Topics Concern   Not on file  Social History Narrative   Lives in senior apartment at [facility]   widowed   Social Determinants of Health   Financial Resource Strain: Low Risk  (10/16/2019)   Overall Financial Resource Strain (CARDIA)    Difficulty of Paying Living Expenses: Not hard at all  Food Insecurity: No Food Insecurity (10/16/2019)   Hunger Vital Sign    Worried About Running Out of Food in the Last Year: Never true  Ran Out of Food in the Last Year: Never true  Transportation Needs: No Transportation Needs (10/16/2019)   PRAPARE - Administrator, Civil Service (Medical): No    Lack of Transportation (Non-Medical): No  Physical Activity: Inactive (10/16/2019)   Exercise Vital Sign    Days of Exercise per Week: 0 days    Minutes of Exercise per Session: 0 min  Stress: No Stress Concern Present (10/16/2019)   [employer] of Occupational Health - Occupational Stress Questionnaire    Feeling of Stress : Only a little  Social Connections: Unknown (10/16/2019)   Social Connection and Isolation Panel [NHANES]    Frequency of Communication  with Friends and Family: More than three times a week    Frequency of Social Gatherings with Friends and Family: Once a week    Attends Religious Services: Not on Marketing executive or Organizations: Yes    Attends Banker Meetings: Not on file    Marital Status: Widowed  Intimate Partner Violence: Not on file    Functional Status Survey:    Family History  Problem Relation Age of Onset   Breast cancer Mother    Breast cancer Sister     Health Maintenance  Topic Date Due   INFLUENZA VACCINE  04/11/2023   COVID-19 Vaccine (6 - 2023-24 season) 05/12/2023   Medicare Annual Wellness (AWV)  09/25/2023   Pneumonia Vaccine 60+ Years old  Completed   DEXA SCAN  Completed   Zoster Vaccines- Shingrix  Completed   HPV VACCINES  Aged Out   DTaP/Tdap/Td  Discontinued    Allergies  Allergen Reactions   Bactrim [Sulfamethoxazole-Trimethoprim] Nausea Only   Ditropan [Oxybutynin]     Made patient feel bad   Sulfa Antibiotics     Other reaction(s): stomach upset    Outpatient Encounter Medications as of 06/21/2023  Medication Sig   acetaminophen (TYLENOL) 325 MG tablet Take 650 mg by mouth every 6 (six) hours as needed for fever.   acetaminophen (TYLENOL) 325 MG tablet Take 650 mg by mouth 3 (three) times daily. SCHEDULED, See as needed dosing also   Alum & Mag Hydroxide-Simeth (ANTACID & ANTIGAS PO) Take 30 mLs by mouth every 4 (four) hours as needed. for Indigestion   amLODipine (NORVASC) 5 MG tablet Take 5 mg by mouth daily.   aspirin EC 81 MG tablet Take 81 mg by mouth daily. Swallow whole.   calcium carbonate (OSCAL) 1500 (600 Ca) MG TABS tablet Take 600 mg of elemental calcium by mouth daily.   Cholecalciferol (VITAMIN D3) 2000 units capsule Take 2,000 Units by mouth daily.   diclofenac Sodium (VOLTAREN ARTHRITIS PAIN) 1 % GEL Apply 2 g topically 3 (three) times daily as needed (Left wrist).   fluticasone (FLONASE) 50 MCG/ACT nasal spray Place 1 spray  into both nostrils 2 (two) times daily.   HYDROcodone-acetaminophen (NORCO/VICODIN) 5-325 MG tablet Take 0.5 tablets by mouth daily. As needed for pain   lidocaine 4 % Place 1 patch onto the skin daily. Apply at bedtime and remove in the am   loperamide (IMODIUM A-D) 2 MG tablet Take 2 mg by mouth as needed for diarrhea or loose stools.   Magnesium 300 MG CAPS Take 300 mg by mouth daily.   magnesium hydroxide (MILK OF MAGNESIA) 400 MG/5ML suspension Take 30 mLs by mouth daily as needed for mild constipation.   minoxidil (ROGAINE) 2 % external solution Apply topically daily as needed.  pantoprazole (PROTONIX) 40 MG tablet Take 40 mg by mouth daily.   polyethylene glycol (MIRALAX / GLYCOLAX) 17 g packet Take 17 g by mouth daily.   potassium chloride SA (KLOR-CON) 20 MEQ tablet Take 20 mEq by mouth daily.   Propylene Glycol (SYSTANE BALANCE OP) Apply 1 drop to eye every evening. related to PRESENCE OF INTRAOCULAR LENS (Z96.1) wait 10 minutes between Systane and timolol drops   rosuvastatin (CRESTOR) 10 MG tablet TAKE 1 TABLET ONCE DAILY.   simethicone (MYLICON) 80 MG chewable tablet Chew 40 mg by mouth 4 (four) times daily as needed.   timolol (TIMOPTIC) 0.5 % ophthalmic solution Place 1 drop into the left eye 2 (two) times daily. related to PRESENCE OF INTRAOCULAR LENS (Z96.1) Wait 10 minutes between Systane and timolol drops   venlafaxine XR (EFFEXOR-XR) 75 MG 24 hr capsule TAKE 2 CAPSULES (150MG ) BY MOUTH DAILY.   vitamin B-12 (CYANOCOBALAMIN) 1000 MCG tablet Take 1,000 mcg by mouth daily.   No facility-administered encounter medications on file as of 06/21/2023.    Review of Systems  Constitutional:  Negative for chills and fever.  Respiratory:  Negative for cough, shortness of breath and wheezing.   Cardiovascular:  Negative for chest pain, palpitations and leg swelling.  Gastrointestinal:  Negative for abdominal distention, abdominal pain, blood in stool, constipation, diarrhea, nausea  and vomiting.       C/o abdominal discomfort   Genitourinary:  Negative for dysuria, frequency and urgency.  Neurological:  Negative for dizziness, weakness and numbness.  Psychiatric/Behavioral:  Negative for confusion.     Vitals:   06/21/23 1611  BP: 138/77  Pulse: 68  Resp: 18  Temp: (!) 97.5 F (36.4 C)  SpO2: 96%  Weight: 179 lb 12.8 oz (81.6 kg)  Height: 5\' 3"  (1.6 m)   Body mass index is 31.85 kg/m. Physical Exam Constitutional:      Appearance: Normal appearance.  HENT:     Head: Normocephalic and atraumatic.  Cardiovascular:     Rate and Rhythm: Normal rate and regular rhythm.     Heart sounds: No murmur heard. Pulmonary:     Effort: Pulmonary effort is normal. No respiratory distress.     Breath sounds: Normal breath sounds. No wheezing.  Abdominal:     General: Bowel sounds are normal. There is no distension.     Tenderness: There is no abdominal tenderness. There is no guarding or rebound.     Comments: Colostomy bag in place, no erythema around the ostomy site   Musculoskeletal:        General: No swelling or tenderness.  Neurological:     Mental Status: She is alert. Mental status is at baseline.     Motor: No weakness.     Labs reviewed: Basic Metabolic Panel: Recent Labs    05/30/23 0000  NA 138  K 3.8  CL 103  CO2 25*  BUN 10  CREATININE 0.6  CALCIUM 9.4   Liver Function Tests: Recent Labs    05/30/23 0000  AST 16  ALT 13  ALKPHOS 72  ALBUMIN 3.9   No results for input(s): "LIPASE", "AMYLASE" in the last 8760 hours. No results for input(s): "AMMONIA" in the last 8760 hours. CBC: Recent Labs    05/30/23 0000  WBC 5.4  NEUTROABS 3,434.00  HGB 12.9  HCT 39  PLT 125*   Cardiac Enzymes: No results for input(s): "CKTOTAL", "CKMB", "CKMBINDEX", "TROPONINI" in the last 8760 hours. BNP: Invalid input(s): "POCBNP" Lab Results  Component Value Date   HGBA1C 5.9 (H) 09/26/2015   Lab Results  Component Value Date   TSH 1.43  05/30/2023   Lab Results  Component Value Date   VITAMINB12 942 12/21/2020   No results found for: "FOLATE" Lab Results  Component Value Date   IRON 22 (L) 04/22/2015   TIBC 402 04/22/2015   FERRITIN 23 04/22/2015    Imaging and Procedures obtained prior to SNF admission: MM 3D SCREEN BREAST BILATERAL  Result Date: 09/28/2022 CLINICAL DATA:  Screening. EXAM: DIGITAL SCREENING BILATERAL MAMMOGRAM WITH TOMOSYNTHESIS AND CAD TECHNIQUE: Bilateral screening digital craniocaudal and mediolateral oblique mammograms were obtained. Bilateral screening digital breast tomosynthesis was performed. The images were evaluated with computer-aided detection. COMPARISON:  Previous exam(s). ACR Breast Density Category b: There are scattered areas of fibroglandular density. FINDINGS: There are no findings suspicious for malignancy. IMPRESSION: No mammographic evidence of malignancy. A result letter of this screening mammogram will be mailed directly to the patient. RECOMMENDATION: Screening mammogram in one year. (Code:SM-B-01Y) BI-RADS CATEGORY  1: Negative. Electronically Signed   By: Sherian Rein M.D.   On: 09/28/2022 13:22    Assessment/Plan  Abdominal discomfort  Pt denies pain but reports discomfort on palpation  She has colostomy bag in place, no erythema, tenderness around the ostomy site Denies dysuria , hematuria, loose stools Will order x ray KUB  Bmp is wnl Will check cbc, lipase Cont with simethicone  Started on protonix yesterday   [facility] Staff reported that her daughter passed away recently Pt had recent covid and was on isolation  States she is worrying a lot  Pt reports that she is not sleep well at night  On effexor 150 mg Will start ativan 0.5 mg q8 prn for anxiety    Family/ staff Communication: care plan discussed with the nursing staff  Labs/tests ordered: KUB, cbc, lipase

## 2023-06-22 MED ORDER — POLYETHYLENE GLYCOL 3350 17 G PO PACK
17.0000 g | PACK | Freq: Two times a day (BID) | ORAL | Status: DC | PRN
Start: 2023-06-22 — End: 2023-07-12

## 2023-06-22 MED ORDER — SIMETHICONE 80 MG PO CHEW
80.0000 mg | CHEWABLE_TABLET | Freq: Four times a day (QID) | ORAL | Status: DC
Start: 2023-06-22 — End: 2024-01-13

## 2023-06-24 ENCOUNTER — Encounter: Payer: Self-pay | Admitting: Sports Medicine

## 2023-06-24 ENCOUNTER — Encounter: Payer: Self-pay | Admitting: Orthopedic Surgery

## 2023-06-24 ENCOUNTER — Non-Acute Institutional Stay: Payer: Self-pay | Admitting: Orthopedic Surgery

## 2023-06-24 DIAGNOSIS — R0602 Shortness of breath: Secondary | ICD-10-CM | POA: Diagnosis not present

## 2023-06-24 DIAGNOSIS — M6281 Muscle weakness (generalized): Secondary | ICD-10-CM | POA: Diagnosis not present

## 2023-06-24 DIAGNOSIS — R748 Abnormal levels of other serum enzymes: Secondary | ICD-10-CM

## 2023-06-24 DIAGNOSIS — R1013 Epigastric pain: Secondary | ICD-10-CM | POA: Diagnosis not present

## 2023-06-24 DIAGNOSIS — R41841 Cognitive communication deficit: Secondary | ICD-10-CM | POA: Diagnosis not present

## 2023-06-24 DIAGNOSIS — R2681 Unsteadiness on feet: Secondary | ICD-10-CM | POA: Diagnosis not present

## 2023-06-24 NOTE — Progress Notes (Signed)
[facility] Room Number: 913-A Place of Service:  ALF [phone]) Provider:  Hazle Nordmann, NP  PCP: Venita Sheffield, MD  Patient Care Team: Venita Sheffield, MD as PCP - General (Internal Medicine) Verner Chol, RPH (Inactive) as Pharmacist (Pharmacist)  Extended Emergency Contact Information Primary Emergency Contact: Julio Sicks [place] Phone: [phone] Relation: Daughter  Code Status:  FULL CODE Goals of care: Advanced Directive information    06/24/2023    1:44 PM  Advanced Directives  Does Patient Have a Medical Advance Directive? Yes  Type of Estate agent of [place];Living will  Does patient want to make changes to medical advance directive? No - Patient declined  Copy of Healthcare Power of Attorney in Chart? Yes - validated most recent copy scanned in chart (See row information)     Chief Complaint  Patient presents with   Acute Visit    Abdominal pain.     HPI:  Pt is a 89 y.o. female seen today for acute visit due to abdominal pain.   H/o sigmoid abscess due to diverticulitis. S/p sigmoid resection with LLQ colostomy 12/03/2020. 10/11 evaluated by Dr. Jacquenette Shone for ongoing abdominal discomfort. KUB no acute process. WBC 6.0, hgb 13.7, hct 42.5, platelets 135, lipase 80. She described abdominal tenderness as "tightness." Abdominal pain to LUQ and epigastric region. Pain intermittent, rated 3/10, no radiation. Denies nausea, vomiting. Colostomy output/texture normal per patient. Remains on simethicone. Protonix started 10/11. Afebrile. Vitals stable.    Past Medical History:  Diagnosis Date   Arthritis    Depression    Hyperlipidemia    Osteopenia    Stroke (HCC)    Thrombocytopenia (HCC)    Vitamin D deficiency    Wrist fracture 2002   left   Past Surgical History:  Procedure Laterality Date   APPENDECTOMY  1967   CARPAL TUNNEL RELEASE  2008   right   COLON  RESECTION SIGMOID N/A 12/03/2020   Procedure: EXPLORATORY LAPAROTOMY; HARTMAN'S PROCEDURE; SPLEENIC FLEXURE MOBILIZATION;  Surgeon: Romie Levee, MD;  Location: [facility];  Service: General;  Laterality: N/A;   COLOSTOMY N/A 12/03/2020   Procedure: COLOSTOMY;  Surgeon: Romie Levee, MD;  Location: [facility];  Service: General;  Laterality: N/A;   JOINT REPLACEMENT  2002   right total     Allergies  Allergen Reactions   Bactrim [Sulfamethoxazole-Trimethoprim] Nausea Only   Ditropan [Oxybutynin]     Made patient feel bad   Sulfa Antibiotics     Other reaction(s): stomach upset    Allergies as of 06/24/2023       Reactions   Bactrim [sulfamethoxazole-trimethoprim] Nausea Only   Ditropan [oxybutynin]    Made patient feel bad   Sulfa Antibiotics    Other reaction(s): stomach upset        Medication List        Accurate as of June 24, 2023  1:44 PM. If you have any questions, ask your nurse or doctor.          acetaminophen 325 MG tablet Commonly known as: TYLENOL Take 650 mg by mouth every 6 (six) hours as needed for fever.   acetaminophen 325 MG tablet Commonly known as: TYLENOL Take 650 mg by mouth 3 (three) times daily. SCHEDULED, See as needed dosing also   amLODipine 5 MG tablet Commonly known as: NORVASC Take 5 mg by mouth daily.   ANTACID & ANTIGAS PO Take 30 mLs by mouth every 4 (  four) hours as needed. for Indigestion   aspirin EC 81 MG tablet Take 81 mg by mouth daily. Swallow whole.   calcium carbonate 1500 (600 Ca) MG Tabs tablet Commonly known as: OSCAL Take 600 mg of elemental calcium by mouth daily.   cyanocobalamin 1000 MCG tablet Commonly known as: VITAMIN B12 Take 1,000 mcg by mouth daily.   fluticasone 50 MCG/ACT nasal spray Commonly known as: FLONASE Place 1 spray into both nostrils 2 (two) times daily.   HYDROcodone-acetaminophen 5-325 MG tablet Commonly known as: NORCO/VICODIN Take 0.5 tablets by mouth daily. As needed for pain    lidocaine 4 % Place 1 patch onto the skin daily. Apply at bedtime and remove in the am   loperamide 2 MG tablet Commonly known as: IMODIUM A-D Take 2 mg by mouth as needed for diarrhea or loose stools.   LORazepam 0.5 MG tablet Commonly known as: ATIVAN Take 0.5 mg by mouth every 8 (eight) hours as needed for anxiety.   Magnesium 300 MG Caps Take 300 mg by mouth daily.   magnesium hydroxide 400 MG/5ML suspension Commonly known as: MILK OF MAGNESIA Take 30 mLs by mouth daily as needed for mild constipation.   minoxidil 2 % external solution Commonly known as: ROGAINE Apply topically daily as needed.   pantoprazole 40 MG tablet Commonly known as: PROTONIX Take 40 mg by mouth daily.   polyethylene glycol 17 g packet Commonly known as: MIRALAX / GLYCOLAX Take 17 g by mouth 2 (two) times daily as needed.   potassium chloride SA 20 MEQ tablet Commonly known as: KLOR-CON M Take 20 mEq by mouth daily.   rosuvastatin 10 MG tablet Commonly known as: CRESTOR Take 10 mg by mouth at bedtime. What changed: Another medication with the same name was removed. Continue taking this medication, and follow the directions you see here. Changed by: Octavia Heir   simethicone 80 MG chewable tablet Commonly known as: MYLICON Chew 1 tablet (80 mg total) by mouth 4 (four) times daily.   SYSTANE BALANCE OP Apply 1 drop to eye every evening. related to PRESENCE OF INTRAOCULAR LENS (Z96.1) wait 10 minutes between Systane and timolol drops   timolol 0.5 % ophthalmic solution Commonly known as: TIMOPTIC Place 1 drop into the left eye 2 (two) times daily. related to PRESENCE OF INTRAOCULAR LENS (Z96.1) Wait 10 minutes between Systane and timolol drops   venlafaxine XR 75 MG 24 hr capsule Commonly known as: EFFEXOR-XR TAKE 2 CAPSULES (150MG ) BY MOUTH DAILY.   Vitamin D3 50 MCG (2000 UT) capsule Take 2,000 Units by mouth daily.   Voltaren Arthritis Pain 1 % Gel Generic drug: diclofenac  Sodium Apply 2 g topically 3 (three) times daily as needed (Left wrist).        Review of Systems  Constitutional:  Negative for activity change and appetite change.  HENT:  Negative for sore throat and trouble swallowing.   Respiratory:  Negative for cough, shortness of breath and wheezing.   Cardiovascular:  Negative for chest pain and leg swelling.  Gastrointestinal:  Positive for abdominal pain. Negative for abdominal distention, constipation, diarrhea, nausea and vomiting.       Colostomy  Psychiatric/Behavioral:  Negative for dysphoric mood. The patient is not nervous/anxious.     Immunization History  Administered Date(s) Administered   Fluad Quad(high Dose 65+) 06/13/2019, 07/02/2022   Influenza Split 06/18/2012   Influenza Whole 07/11/2010   Influenza, High Dose Seasonal PF 07/07/2013, 07/14/2014, 06/24/2015, 06/13/2016, 06/04/2017, 06/17/2018  Influenza-Unspecified 06/22/2020, 06/29/2021   Moderna Covid-19 Vaccine Bivalent Booster 35yrs & up 01/26/2022   Moderna SARS-COV2 Booster Vaccination 07/19/2020, 02/07/2021   Moderna Sars-Covid-2 Vaccination 09/14/2019, 10/12/2019   PFIZER(Purple Top)SARS-COV-2 Vaccination 05/30/2021   Pfizer Covid-19 Vaccine Bivalent Booster 20yrs & up 07/12/2022   Pneumococcal Conjugate-13 04/27/2014, 08/22/2021   Pneumococcal Polysaccharide-23 10/15/2017   Td 09/10/2005   Zoster Recombinant(Shingrix) 08/22/2021, 10/25/2021, 02/28/2022   Zoster, Live 11/29/2006   Pertinent  Health Maintenance Due  Topic Date Due   INFLUENZA VACCINE  04/11/2023   DEXA SCAN  Completed      12/14/2020    5:00 AM 12/14/2020    8:00 AM 12/14/2020    8:29 PM 12/15/2020    8:44 AM 10/02/2022    9:39 AM  Fall Risk  Falls in the past year?     0  Was there an injury with Fall?     0  Fall Risk Category Calculator     0  (RETIRED) Patient Fall Risk Level Moderate fall risk Moderate fall risk Moderate fall risk High fall risk   Patient at Risk for Falls Due to      No Fall Risks  Fall risk Follow up     Falls evaluation completed   Functional Status Survey:    Vitals:   06/24/23 1331  BP: (!) 152/83  Pulse: 89  Resp: 18  Temp: 98.5 F (36.9 C)  SpO2: 96%  Weight: 179 lb 12.8 oz (81.6 kg)  Height: 5\' 3"  (1.6 m)   Body mass index is 31.85 kg/m. Physical Exam Vitals reviewed.  Constitutional:      General: She is not in acute distress. HENT:     Head: Normocephalic.  Eyes:     General:        Right eye: No discharge.        Left eye: No discharge.  Cardiovascular:     Rate and Rhythm: Normal rate and regular rhythm.     Pulses: Normal pulses.     Heart sounds: Normal heart sounds.  Pulmonary:     Effort: Pulmonary effort is normal. No respiratory distress.     Breath sounds: Normal breath sounds. No wheezing.  Abdominal:     General: Bowel sounds are normal. There is no distension.     Palpations: Abdomen is soft. There is no mass.     Tenderness: There is abdominal tenderness. There is no guarding or rebound.     Hernia: No hernia is present.     Comments: LUQ and epigastric tenderness, colostomy with normal output, stoma beefy red> surrounding skin intact  Musculoskeletal:     Cervical back: Neck supple.     Right lower leg: No edema.     Left lower leg: No edema.  Skin:    General: Skin is warm.     Capillary Refill: Capillary refill takes less than 2 seconds.  Neurological:     General: No focal deficit present.     Mental Status: She is alert and oriented to person, place, and time.     Motor: Weakness present.     Gait: Gait abnormal.     Comments: walker  Psychiatric:        Mood and Affect: Mood normal.     Labs reviewed: Recent Labs    05/30/23 0000  NA 138  K 3.8  CL 103  CO2 25*  BUN 10  CREATININE 0.6  CALCIUM 9.4   Recent Labs    05/30/23  0000  AST 16  ALT 13  ALKPHOS 72  ALBUMIN 3.9   Recent Labs    05/30/23 0000  WBC 5.4  NEUTROABS 3,434.00  HGB 12.9  HCT 39  PLT 125*   Lab  Results  Component Value Date   TSH 1.43 05/30/2023   Lab Results  Component Value Date   HGBA1C 5.9 (H) 09/26/2015   Lab Results  Component Value Date   CHOL 128 05/30/2023   HDL 51 05/30/2023   LDLCALC 58 05/30/2023   LDLDIRECT 147.0 11/17/2012   TRIG 106 05/30/2023   CHOLHDL 3 02/10/2020    Significant Diagnostic Results in last 30 days:  No results found.  Assessment/Plan 1. Elevated lipase - 10/11 abdominal pain - h/o sigmoid resection with colostomy 12/03/2020 - tenderness to LUQ and epigastric - KUB no acute abnormality, WBC 6.0 - change to liquid diet - repeat lipase and add hepatic panel  - cont simethicone and Protonix  2. Epigastric pain - see above     Family/ staff Communication: plan discussed with patient and nurse  Labs/tests ordered:   repeat lipase and add hepatic panel

## 2023-06-25 DIAGNOSIS — R41841 Cognitive communication deficit: Secondary | ICD-10-CM | POA: Diagnosis not present

## 2023-06-25 DIAGNOSIS — R141 Gas pain: Secondary | ICD-10-CM | POA: Diagnosis not present

## 2023-06-25 DIAGNOSIS — R0602 Shortness of breath: Secondary | ICD-10-CM | POA: Diagnosis not present

## 2023-06-25 DIAGNOSIS — M6281 Muscle weakness (generalized): Secondary | ICD-10-CM | POA: Diagnosis not present

## 2023-06-25 DIAGNOSIS — R2681 Unsteadiness on feet: Secondary | ICD-10-CM | POA: Diagnosis not present

## 2023-06-25 DIAGNOSIS — R1084 Generalized abdominal pain: Secondary | ICD-10-CM | POA: Diagnosis not present

## 2023-06-25 LAB — HEPATIC FUNCTION PANEL
ALT: 15 U/L (ref 7–35)
AST: 17 (ref 13–35)
Alkaline Phosphatase: 71 (ref 25–125)
Bilirubin, Direct: 0.2
Bilirubin, Total: 0.9

## 2023-06-25 LAB — COMPREHENSIVE METABOLIC PANEL
Albumin: 3.7 (ref 3.5–5.0)
Globulin: 2.5

## 2023-06-26 DIAGNOSIS — R2681 Unsteadiness on feet: Secondary | ICD-10-CM | POA: Diagnosis not present

## 2023-06-26 DIAGNOSIS — M6281 Muscle weakness (generalized): Secondary | ICD-10-CM | POA: Diagnosis not present

## 2023-06-26 DIAGNOSIS — R41841 Cognitive communication deficit: Secondary | ICD-10-CM | POA: Diagnosis not present

## 2023-06-26 DIAGNOSIS — Z23 Encounter for immunization: Secondary | ICD-10-CM | POA: Diagnosis not present

## 2023-06-26 DIAGNOSIS — R0602 Shortness of breath: Secondary | ICD-10-CM | POA: Diagnosis not present

## 2023-06-28 DIAGNOSIS — R0602 Shortness of breath: Secondary | ICD-10-CM | POA: Diagnosis not present

## 2023-06-28 DIAGNOSIS — M6281 Muscle weakness (generalized): Secondary | ICD-10-CM | POA: Diagnosis not present

## 2023-06-28 DIAGNOSIS — R41841 Cognitive communication deficit: Secondary | ICD-10-CM | POA: Diagnosis not present

## 2023-06-28 DIAGNOSIS — R2681 Unsteadiness on feet: Secondary | ICD-10-CM | POA: Diagnosis not present

## 2023-07-01 DIAGNOSIS — R0602 Shortness of breath: Secondary | ICD-10-CM | POA: Diagnosis not present

## 2023-07-01 DIAGNOSIS — R41841 Cognitive communication deficit: Secondary | ICD-10-CM | POA: Diagnosis not present

## 2023-07-01 DIAGNOSIS — R2681 Unsteadiness on feet: Secondary | ICD-10-CM | POA: Diagnosis not present

## 2023-07-01 DIAGNOSIS — M6281 Muscle weakness (generalized): Secondary | ICD-10-CM | POA: Diagnosis not present

## 2023-07-02 DIAGNOSIS — M6281 Muscle weakness (generalized): Secondary | ICD-10-CM | POA: Diagnosis not present

## 2023-07-02 DIAGNOSIS — R1084 Generalized abdominal pain: Secondary | ICD-10-CM | POA: Diagnosis not present

## 2023-07-02 DIAGNOSIS — R0602 Shortness of breath: Secondary | ICD-10-CM | POA: Diagnosis not present

## 2023-07-02 DIAGNOSIS — R41841 Cognitive communication deficit: Secondary | ICD-10-CM | POA: Diagnosis not present

## 2023-07-02 DIAGNOSIS — R2681 Unsteadiness on feet: Secondary | ICD-10-CM | POA: Diagnosis not present

## 2023-07-03 DIAGNOSIS — R0602 Shortness of breath: Secondary | ICD-10-CM | POA: Diagnosis not present

## 2023-07-03 DIAGNOSIS — M6281 Muscle weakness (generalized): Secondary | ICD-10-CM | POA: Diagnosis not present

## 2023-07-03 DIAGNOSIS — R41841 Cognitive communication deficit: Secondary | ICD-10-CM | POA: Diagnosis not present

## 2023-07-03 DIAGNOSIS — R2681 Unsteadiness on feet: Secondary | ICD-10-CM | POA: Diagnosis not present

## 2023-07-05 DIAGNOSIS — R0602 Shortness of breath: Secondary | ICD-10-CM | POA: Diagnosis not present

## 2023-07-05 DIAGNOSIS — R41841 Cognitive communication deficit: Secondary | ICD-10-CM | POA: Diagnosis not present

## 2023-07-05 DIAGNOSIS — R2681 Unsteadiness on feet: Secondary | ICD-10-CM | POA: Diagnosis not present

## 2023-07-05 DIAGNOSIS — M6281 Muscle weakness (generalized): Secondary | ICD-10-CM | POA: Diagnosis not present

## 2023-07-08 DIAGNOSIS — R41841 Cognitive communication deficit: Secondary | ICD-10-CM | POA: Diagnosis not present

## 2023-07-08 DIAGNOSIS — R2681 Unsteadiness on feet: Secondary | ICD-10-CM | POA: Diagnosis not present

## 2023-07-08 DIAGNOSIS — R0602 Shortness of breath: Secondary | ICD-10-CM | POA: Diagnosis not present

## 2023-07-08 DIAGNOSIS — M6281 Muscle weakness (generalized): Secondary | ICD-10-CM | POA: Diagnosis not present

## 2023-07-09 DIAGNOSIS — M6281 Muscle weakness (generalized): Secondary | ICD-10-CM | POA: Diagnosis not present

## 2023-07-09 DIAGNOSIS — R41841 Cognitive communication deficit: Secondary | ICD-10-CM | POA: Diagnosis not present

## 2023-07-09 DIAGNOSIS — R2681 Unsteadiness on feet: Secondary | ICD-10-CM | POA: Diagnosis not present

## 2023-07-09 DIAGNOSIS — R0602 Shortness of breath: Secondary | ICD-10-CM | POA: Diagnosis not present

## 2023-07-10 DIAGNOSIS — R0602 Shortness of breath: Secondary | ICD-10-CM | POA: Diagnosis not present

## 2023-07-10 DIAGNOSIS — R2681 Unsteadiness on feet: Secondary | ICD-10-CM | POA: Diagnosis not present

## 2023-07-10 DIAGNOSIS — R41841 Cognitive communication deficit: Secondary | ICD-10-CM | POA: Diagnosis not present

## 2023-07-10 DIAGNOSIS — M6281 Muscle weakness (generalized): Secondary | ICD-10-CM | POA: Diagnosis not present

## 2023-07-12 ENCOUNTER — Encounter: Payer: Self-pay | Admitting: Sports Medicine

## 2023-07-12 ENCOUNTER — Non-Acute Institutional Stay: Payer: Medicare Other | Admitting: Sports Medicine

## 2023-07-12 DIAGNOSIS — R0602 Shortness of breath: Secondary | ICD-10-CM | POA: Diagnosis not present

## 2023-07-12 DIAGNOSIS — R109 Unspecified abdominal pain: Secondary | ICD-10-CM | POA: Diagnosis not present

## 2023-07-12 DIAGNOSIS — R41841 Cognitive communication deficit: Secondary | ICD-10-CM | POA: Diagnosis not present

## 2023-07-12 DIAGNOSIS — R2681 Unsteadiness on feet: Secondary | ICD-10-CM | POA: Diagnosis not present

## 2023-07-12 DIAGNOSIS — M6281 Muscle weakness (generalized): Secondary | ICD-10-CM | POA: Diagnosis not present

## 2023-07-12 NOTE — Progress Notes (Unsigned)
Location:   [facility] Room Number: 913-A Place of Service:  ALF [phone]) Provider:  Oletta Lamas   PCP: Venita Sheffield, MD  Patient Care Team: Venita Sheffield, MD as PCP - General (Internal Medicine) Verner Chol, RPH (Inactive) as Pharmacist (Pharmacist)  Extended Emergency Contact Information Primary Emergency Contact: Julio Sicks [place] Phone: [phone] Relation: Daughter  Code Status:  FULL CODE Goals of care: Advanced Directive information    07/12/2023    3:37 PM  Advanced Directives  Does Patient Have a Medical Advance Directive? Yes  Type of Estate agent of [place];Living will  Does patient want to make changes to medical advance directive? No - Patient declined  Copy of Healthcare Power of Attorney in Chart? Yes - validated most recent copy scanned in chart (See row information)     Chief Complaint  Patient presents with   Acute Visit    Abdominal discomfort.     HPI:  Pt is a 89 y.o. female seen today for an acute visit for    Past Medical History:  Diagnosis Date   Arthritis    Depression    Hyperlipidemia    Osteopenia    Stroke (HCC)    Thrombocytopenia (HCC)    Vitamin D deficiency    Wrist fracture 2002   left   Past Surgical History:  Procedure Laterality Date   APPENDECTOMY  1967   CARPAL TUNNEL RELEASE  2008   right   COLON RESECTION SIGMOID N/A 12/03/2020   Procedure: EXPLORATORY LAPAROTOMY; HARTMAN'S PROCEDURE; SPLEENIC FLEXURE MOBILIZATION;  Surgeon: Romie Levee, MD;  Location: [facility];  Service: General;  Laterality: N/A;   COLOSTOMY N/A 12/03/2020   Procedure: COLOSTOMY;  Surgeon: Romie Levee, MD;  Location: [facility];  Service: General;  Laterality: N/A;   JOINT REPLACEMENT  2002   right total     Allergies  Allergen Reactions   Bactrim [Sulfamethoxazole-Trimethoprim] Nausea Only   Ditropan [Oxybutynin]     Made patient  feel bad   Sulfa Antibiotics     Other reaction(s): stomach upset    Allergies as of 07/12/2023       Reactions   Bactrim [sulfamethoxazole-trimethoprim] Nausea Only   Ditropan [oxybutynin]    Made patient feel bad   Sulfa Antibiotics    Other reaction(s): stomach upset        Medication List        Accurate as of July 12, 2023  3:37 PM. If you have any questions, ask your nurse or doctor.          acetaminophen 325 MG tablet Commonly known as: TYLENOL Take 650 mg by mouth every 6 (six) hours as needed for fever.   acetaminophen 325 MG tablet Commonly known as: TYLENOL Take 650 mg by mouth 3 (three) times daily. SCHEDULED, See as needed dosing also   amLODipine 5 MG tablet Commonly known as: NORVASC Take 5 mg by mouth daily.   ANTACID & ANTIGAS PO Take 30 mLs by mouth every 4 (four) hours as needed. for Indigestion   aspirin EC 81 MG tablet Take 81 mg by mouth daily. Swallow whole.   calcium carbonate 1500 (600 Ca) MG Tabs tablet Commonly known as: OSCAL Take 600 mg of elemental calcium by mouth daily.   cyanocobalamin 1000 MCG tablet Commonly known as: VITAMIN B12 Take 1,000 mcg by mouth daily.   fluticasone 50 MCG/ACT nasal spray Commonly known as: FLONASE Place 1 spray into  both nostrils 2 (two) times daily.   HYDROcodone-acetaminophen 5-325 MG tablet Commonly known as: NORCO/VICODIN Take 0.5 tablets by mouth daily. As needed for pain   lidocaine 4 % Place 1 patch onto the skin daily. Apply at bedtime and remove in the am   loperamide 2 MG tablet Commonly known as: IMODIUM A-D Take 2 mg by mouth as needed for diarrhea or loose stools.   LORazepam 0.5 MG tablet Commonly known as: ATIVAN Take 0.5 mg by mouth every 8 (eight) hours as needed for anxiety.   Magnesium 300 MG Caps Take 300 mg by mouth daily.   magnesium hydroxide 400 MG/5ML suspension Commonly known as: MILK OF MAGNESIA Take 30 mLs by mouth daily as needed for mild  constipation.   minoxidil 2 % external solution Commonly known as: ROGAINE Apply topically daily as needed.   pantoprazole 40 MG tablet Commonly known as: PROTONIX Take 40 mg by mouth daily.   polyethylene glycol 17 g packet Commonly known as: MIRALAX / GLYCOLAX Take 17 g by mouth as needed. What changed: Another medication with the same name was removed. Continue taking this medication, and follow the directions you see here. Changed by: Venita Sheffield   polyethylene glycol 17 g packet Commonly known as: MIRALAX / GLYCOLAX Take 17 g by mouth 2 (two) times daily. What changed: Another medication with the same name was removed. Continue taking this medication, and follow the directions you see here. Changed by: Venita Sheffield   potassium chloride SA 20 MEQ tablet Commonly known as: KLOR-CON M Take 20 mEq by mouth daily.   rosuvastatin 10 MG tablet Commonly known as: CRESTOR Take 10 mg by mouth at bedtime.   simethicone 80 MG chewable tablet Commonly known as: MYLICON Chew 1 tablet (80 mg total) by mouth 4 (four) times daily.   SYSTANE BALANCE OP Apply 1 drop to eye every evening. related to PRESENCE OF INTRAOCULAR LENS (Z96.1) wait 10 minutes between Systane and timolol drops   timolol 0.5 % ophthalmic solution Commonly known as: TIMOPTIC Place 1 drop into the left eye 2 (two) times daily. related to PRESENCE OF INTRAOCULAR LENS (Z96.1) Wait 10 minutes between Systane and timolol drops   venlafaxine XR 75 MG 24 hr capsule Commonly known as: EFFEXOR-XR TAKE 2 CAPSULES (150MG ) BY MOUTH DAILY.   Vitamin D3 50 MCG (2000 UT) capsule Take 2,000 Units by mouth daily.   Voltaren Arthritis Pain 1 % Gel Generic drug: diclofenac Sodium Apply 2 g topically 3 (three) times daily as needed (Left wrist).        Review of Systems  Immunization History  Administered Date(s) Administered   Fluad Quad(high Dose 65+) 06/13/2019, 07/02/2022   Influenza Split  06/18/2012   Influenza Whole 07/11/2010   Influenza, High Dose Seasonal PF 07/07/2013, 07/14/2014, 06/24/2015, 06/13/2016, 06/04/2017, 06/17/2018   Influenza-Unspecified 06/22/2020, 06/29/2021   Moderna Covid-19 Vaccine Bivalent Booster 22yrs & up 01/26/2022   Moderna SARS-COV2 Booster Vaccination 07/19/2020, 02/07/2021   Moderna Sars-Covid-2 Vaccination 09/14/2019, 10/12/2019   PFIZER(Purple Top)SARS-COV-2 Vaccination 05/30/2021   Pfizer Covid-19 Vaccine Bivalent Booster 67yrs & up 07/12/2022   Pneumococcal Conjugate-13 04/27/2014, 08/22/2021   Pneumococcal Polysaccharide-23 10/15/2017   Td 09/10/2005   Zoster Recombinant(Shingrix) 08/22/2021, 10/25/2021, 02/28/2022   Zoster, Live 11/29/2006   Pertinent  Health Maintenance Due  Topic Date Due   INFLUENZA VACCINE  04/11/2023   DEXA SCAN  Completed      12/14/2020    5:00 AM 12/14/2020    8:00 AM 12/14/2020  8:29 PM 12/15/2020    8:44 AM 10/02/2022    9:39 AM  Fall Risk  Falls in the past year?     0  Was there an injury with Fall?     0  Fall Risk Category Calculator     0  (RETIRED) Patient Fall Risk Level Moderate fall risk Moderate fall risk Moderate fall risk High fall risk   Patient at Risk for Falls Due to     No Fall Risks  Fall risk Follow up     Falls evaluation completed   Functional Status Survey:    Vitals:   07/12/23 1528  BP: (!) 145/83  Pulse: 100  Resp: 20  Temp: 97.9 F (36.6 C)  SpO2: 97%  Weight: 174 lb 3.2 oz (79 kg)  Height: 5\' 3"  (1.6 m)   Body mass index is 30.86 kg/m. Physical Exam  Labs reviewed: Recent Labs    05/30/23 0000 06/21/23 0000  NA 138 134*  K 3.8 3.9  CL 103 99  CO2 25* 26*  BUN 10 15  CREATININE 0.6 0.7  CALCIUM 9.4 9.2   Recent Labs    05/30/23 0000  AST 16  ALT 13  ALKPHOS 72  ALBUMIN 3.9   Recent Labs    05/30/23 0000 06/21/23 0000  WBC 5.4 6.0  NEUTROABS 3,434.00 3,630.00  HGB 12.9 13.7  HCT 39 43  PLT 125* 135*   Lab Results  Component Value Date    TSH 1.43 05/30/2023   Lab Results  Component Value Date   HGBA1C 5.9 (H) 09/26/2015   Lab Results  Component Value Date   CHOL 128 05/30/2023   HDL 51 05/30/2023   LDLCALC 58 05/30/2023   LDLDIRECT 147.0 11/17/2012   TRIG 106 05/30/2023   CHOLHDL 3 02/10/2020    Significant Diagnostic Results in last 30 days:  No results found.  Assessment/Plan There are no diagnoses linked to this encounter.   Family/ staff Communication:   Labs/tests ordered:

## 2023-07-12 NOTE — Progress Notes (Unsigned)
Provider: Venita Sheffield MD Location:  [facility] Room Number: [id number]-A Place of Service:  ALF (13)  PCP: Venita Sheffield, MD Patient Care Team: Venita Sheffield, MD as PCP - General (Internal Medicine) Verner Chol, [facility] (Inactive) as Pharmacist (Pharmacist)  Extended Emergency Contact Information Primary Emergency Contact: Julio Sicks [place] Phone: [phone] Relation: Daughter  Code Status:  Goals of Care: Advanced Directive information    06/24/2023    1:44 PM  Advanced Directives  Does Patient Have a Medical Advance Directive? Yes  Type of Estate agent of [place];Living will  Does patient want to make changes to medical advance directive? No - Patient declined  Copy of Healthcare Power of Attorney in Chart? Yes - validated most recent copy scanned in chart (See row information)      Chief Complaint  Patient presents with   Acute Visit    Abdominal discomfort.     HPI: Patient is a 89 y.o. female seen today for acute visit for abdominal discomfort As per morning shift nurse pt c/o intermittent pain in her belly since few weeks  Pt is tolerating PO food, No nausea, no vomiting Pt not in her room, nurse does not know where the patient is in the building at this moment Vitals 148/53, HR 100, Temp 97.9 , RR 20 , O2 sat 97%  Will order CT abd and pelvis , Gi referral   Past Medical History:  Diagnosis Date   Arthritis    Depression    Hyperlipidemia    Osteopenia    Stroke (HCC)    Thrombocytopenia (HCC)    Vitamin D deficiency    Wrist fracture 2002   left   Past Surgical History:  Procedure Laterality Date   APPENDECTOMY  1967   CARPAL TUNNEL RELEASE  2008   right   COLON RESECTION SIGMOID N/A 12/03/2020   Procedure: EXPLORATORY LAPAROTOMY; HARTMAN'S PROCEDURE; SPLEENIC FLEXURE MOBILIZATION;  Surgeon: Romie Levee, MD;  Location: [facility];  Service: General;   Laterality: N/A;   COLOSTOMY N/A 12/03/2020   Procedure: COLOSTOMY;  Surgeon: Romie Levee, MD;  Location: [facility];  Service: General;  Laterality: N/A;   JOINT REPLACEMENT  2002   right total     reports that she has never smoked. She has never used smokeless tobacco. She reports that she does not drink alcohol and does not use drugs. Social History   Socioeconomic History   Marital status: Widowed    Spouse name: Not on file   Number of children: 4   Years of education: 16   Highest education level: Bachelor's degree (e.g., BA, AB, BS)  Occupational History   Not on file  Tobacco Use   Smoking status: Never   Smokeless tobacco: Never  Vaping Use   Vaping status: Never Used  Substance and Sexual Activity   Alcohol use: No   Drug use: No   Sexual activity: Not on file  Other Topics Concern   Not on file  Social History Narrative   Lives in senior apartment at [facility]   widowed   Social Determinants of Health   Financial Resource Strain: Low Risk  (10/16/2019)   Overall Financial Resource Strain (CARDIA)    Difficulty of Paying Living Expenses: Not hard at all  Food Insecurity: No Food Insecurity (10/16/2019)   Hunger Vital Sign    Worried About Running Out of Food in the Last Year: Never true    Ran Out  of Food in the Last Year: Never true  Transportation Needs: No Transportation Needs (10/16/2019)   PRAPARE - Administrator, Civil Service (Medical): No    Lack of Transportation (Non-Medical): No  Physical Activity: Inactive (10/16/2019)   Exercise Vital Sign    Days of Exercise per Week: 0 days    Minutes of Exercise per Session: 0 min  Stress: No Stress Concern Present (10/16/2019)   [employer] of Occupational Health - Occupational Stress Questionnaire    Feeling of Stress : Only a little  Social Connections: Unknown (10/16/2019)   Social Connection and Isolation Panel [NHANES]    Frequency of Communication with Friends and Family: More than three  times a week    Frequency of Social Gatherings with Friends and Family: Once a week    Attends Religious Services: Not on Marketing executive or Organizations: Yes    Attends Banker Meetings: Not on file    Marital Status: Widowed  Intimate Partner Violence: Not on file    Functional Status Survey:    Family History  Problem Relation Age of Onset   Breast cancer Mother    Breast cancer Sister     Health Maintenance  Topic Date Due   INFLUENZA VACCINE  04/11/2023   COVID-19 Vaccine (6 - 2023-24 season) 05/12/2023   Medicare Annual Wellness (AWV)  09/25/2023   Pneumonia Vaccine 75+ Years old  Completed   DEXA SCAN  Completed   Zoster Vaccines- Shingrix  Completed   HPV VACCINES  Aged Out   DTaP/Tdap/Td  Discontinued    Allergies  Allergen Reactions   Bactrim [Sulfamethoxazole-Trimethoprim] Nausea Only   Ditropan [Oxybutynin]     Made patient feel bad   Sulfa Antibiotics     Other reaction(s): stomach upset    Outpatient Encounter Medications as of 07/12/2023  Medication Sig   acetaminophen (TYLENOL) 325 MG tablet Take 650 mg by mouth every 6 (six) hours as needed for fever.   acetaminophen (TYLENOL) 325 MG tablet Take 650 mg by mouth 3 (three) times daily. SCHEDULED, See as needed dosing also   Alum & Mag Hydroxide-Simeth (ANTACID & ANTIGAS PO) Take 30 mLs by mouth every 4 (four) hours as needed. for Indigestion   amLODipine (NORVASC) 5 MG tablet Take 5 mg by mouth daily.   aspirin EC 81 MG tablet Take 81 mg by mouth daily. Swallow whole.   calcium carbonate (OSCAL) 1500 (600 Ca) MG TABS tablet Take 600 mg of elemental calcium by mouth daily.   Cholecalciferol (VITAMIN D3) 2000 units capsule Take 2,000 Units by mouth daily.   diclofenac Sodium (VOLTAREN ARTHRITIS PAIN) 1 % GEL Apply 2 g topically 3 (three) times daily as needed (Left wrist).   fluticasone (FLONASE) 50 MCG/ACT nasal spray Place 1 spray into both nostrils 2 (two) times daily.    HYDROcodone-acetaminophen (NORCO/VICODIN) 5-325 MG tablet Take 0.5 tablets by mouth daily. As needed for pain   lidocaine 4 % Place 1 patch onto the skin daily. Apply at bedtime and remove in the am   loperamide (IMODIUM A-D) 2 MG tablet Take 2 mg by mouth as needed for diarrhea or loose stools.   LORazepam (ATIVAN) 0.5 MG tablet Take 0.5 mg by mouth every 8 (eight) hours as needed for anxiety.   Magnesium 300 MG CAPS Take 300 mg by mouth daily.   magnesium hydroxide (MILK OF MAGNESIA) 400 MG/5ML suspension Take 30 mLs by mouth daily as needed  for mild constipation.   minoxidil (ROGAINE) 2 % external solution Apply topically daily as needed.   pantoprazole (PROTONIX) 40 MG tablet Take 40 mg by mouth daily.   polyethylene glycol (MIRALAX / GLYCOLAX) 17 g packet Take 17 g by mouth 2 (two) times daily as needed.   potassium chloride SA (KLOR-CON) 20 MEQ tablet Take 20 mEq by mouth daily.   Propylene Glycol (SYSTANE BALANCE OP) Apply 1 drop to eye every evening. related to PRESENCE OF INTRAOCULAR LENS (Z96.1) wait 10 minutes between Systane and timolol drops   rosuvastatin (CRESTOR) 10 MG tablet Take 10 mg by mouth at bedtime.   simethicone (MYLICON) 80 MG chewable tablet Chew 1 tablet (80 mg total) by mouth 4 (four) times daily.   timolol (TIMOPTIC) 0.5 % ophthalmic solution Place 1 drop into the left eye 2 (two) times daily. related to PRESENCE OF INTRAOCULAR LENS (Z96.1) Wait 10 minutes between Systane and timolol drops   venlafaxine XR (EFFEXOR-XR) 75 MG 24 hr capsule TAKE 2 CAPSULES (150MG ) BY MOUTH DAILY.   vitamin B-12 (CYANOCOBALAMIN) 1000 MCG tablet Take 1,000 mcg by mouth daily.   No facility-administered encounter medications on file as of 07/12/2023.    Review of Systems  Vitals:   07/12/23 1528  BP: (!) 145/83  Pulse: 100  Resp: 20  Temp: 97.9 F (36.6 C)  SpO2: 97%  Weight: 174 lb 3.2 oz (79 kg)  Height: 5\' 3"  (1.6 m)   Body mass index is 30.86 kg/m. Physical Exam  Labs  reviewed: Basic Metabolic Panel: Recent Labs    05/30/23 0000 06/21/23 0000  NA 138 134*  K 3.8 3.9  CL 103 99  CO2 25* 26*  BUN 10 15  CREATININE 0.6 0.7  CALCIUM 9.4 9.2   Liver Function Tests: Recent Labs    05/30/23 0000  AST 16  ALT 13  ALKPHOS 72  ALBUMIN 3.9   No results for input(s): "LIPASE", "AMYLASE" in the last 8760 hours. No results for input(s): "AMMONIA" in the last 8760 hours. CBC: Recent Labs    05/30/23 0000 06/21/23 0000  WBC 5.4 6.0  NEUTROABS 3,434.00 3,630.00  HGB 12.9 13.7  HCT 39 43  PLT 125* 135*   Cardiac Enzymes: No results for input(s): "CKTOTAL", "CKMB", "CKMBINDEX", "TROPONINI" in the last 8760 hours. BNP: Invalid input(s): "POCBNP" Lab Results  Component Value Date   HGBA1C 5.9 (H) 09/26/2015   Lab Results  Component Value Date   TSH 1.43 05/30/2023   Lab Results  Component Value Date   VITAMINB12 942 12/21/2020   No results found for: "FOLATE" Lab Results  Component Value Date   IRON 22 (L) 04/22/2015   TIBC 402 04/22/2015   FERRITIN 23 04/22/2015    Imaging and Procedures obtained prior to SNF admission: MM 3D SCREEN BREAST BILATERAL  Result Date: 09/28/2022 CLINICAL DATA:  Screening. EXAM: DIGITAL SCREENING BILATERAL MAMMOGRAM WITH TOMOSYNTHESIS AND CAD TECHNIQUE: Bilateral screening digital craniocaudal and mediolateral oblique mammograms were obtained. Bilateral screening digital breast tomosynthesis was performed. The images were evaluated with computer-aided detection. COMPARISON:  Previous exam(s). ACR Breast Density Category b: There are scattered areas of fibroglandular density. FINDINGS: There are no findings suspicious for malignancy. IMPRESSION: No mammographic evidence of malignancy. A result letter of this screening mammogram will be mailed directly to the patient. RECOMMENDATION: Screening mammogram in one year. (Code:SM-B-01Y) BI-RADS CATEGORY  1: Negative. Electronically Signed   By: Sherian Rein M.D.    On: 09/28/2022 13:22  Assessment/Plan There are no diagnoses linked to this encounter.   Family/ staff Communication:   Labs/tests ordered:   \Will order CT abd and pelvis , Gi referral

## 2023-07-15 ENCOUNTER — Encounter: Payer: Self-pay | Admitting: Sports Medicine

## 2023-07-15 DIAGNOSIS — M6281 Muscle weakness (generalized): Secondary | ICD-10-CM | POA: Diagnosis not present

## 2023-07-15 DIAGNOSIS — R2681 Unsteadiness on feet: Secondary | ICD-10-CM | POA: Diagnosis not present

## 2023-07-15 DIAGNOSIS — R41841 Cognitive communication deficit: Secondary | ICD-10-CM | POA: Diagnosis not present

## 2023-07-15 DIAGNOSIS — R0602 Shortness of breath: Secondary | ICD-10-CM | POA: Diagnosis not present

## 2023-07-16 DIAGNOSIS — R2681 Unsteadiness on feet: Secondary | ICD-10-CM | POA: Diagnosis not present

## 2023-07-16 DIAGNOSIS — R0602 Shortness of breath: Secondary | ICD-10-CM | POA: Diagnosis not present

## 2023-07-16 DIAGNOSIS — R41841 Cognitive communication deficit: Secondary | ICD-10-CM | POA: Diagnosis not present

## 2023-07-16 DIAGNOSIS — M6281 Muscle weakness (generalized): Secondary | ICD-10-CM | POA: Diagnosis not present

## 2023-07-17 DIAGNOSIS — R0602 Shortness of breath: Secondary | ICD-10-CM | POA: Diagnosis not present

## 2023-07-17 DIAGNOSIS — M6281 Muscle weakness (generalized): Secondary | ICD-10-CM | POA: Diagnosis not present

## 2023-07-17 DIAGNOSIS — R2681 Unsteadiness on feet: Secondary | ICD-10-CM | POA: Diagnosis not present

## 2023-07-17 DIAGNOSIS — R41841 Cognitive communication deficit: Secondary | ICD-10-CM | POA: Diagnosis not present

## 2023-07-19 DIAGNOSIS — R2681 Unsteadiness on feet: Secondary | ICD-10-CM | POA: Diagnosis not present

## 2023-07-19 DIAGNOSIS — M6281 Muscle weakness (generalized): Secondary | ICD-10-CM | POA: Diagnosis not present

## 2023-07-19 DIAGNOSIS — R41841 Cognitive communication deficit: Secondary | ICD-10-CM | POA: Diagnosis not present

## 2023-07-19 DIAGNOSIS — R0602 Shortness of breath: Secondary | ICD-10-CM | POA: Diagnosis not present

## 2023-07-22 DIAGNOSIS — M6281 Muscle weakness (generalized): Secondary | ICD-10-CM | POA: Diagnosis not present

## 2023-07-22 DIAGNOSIS — R0602 Shortness of breath: Secondary | ICD-10-CM | POA: Diagnosis not present

## 2023-07-22 DIAGNOSIS — R2681 Unsteadiness on feet: Secondary | ICD-10-CM | POA: Diagnosis not present

## 2023-07-22 DIAGNOSIS — R41841 Cognitive communication deficit: Secondary | ICD-10-CM | POA: Diagnosis not present

## 2023-07-23 DIAGNOSIS — R2681 Unsteadiness on feet: Secondary | ICD-10-CM | POA: Diagnosis not present

## 2023-07-23 DIAGNOSIS — R0602 Shortness of breath: Secondary | ICD-10-CM | POA: Diagnosis not present

## 2023-07-23 DIAGNOSIS — R41841 Cognitive communication deficit: Secondary | ICD-10-CM | POA: Diagnosis not present

## 2023-07-23 DIAGNOSIS — M6281 Muscle weakness (generalized): Secondary | ICD-10-CM | POA: Diagnosis not present

## 2023-07-24 DIAGNOSIS — R0602 Shortness of breath: Secondary | ICD-10-CM | POA: Diagnosis not present

## 2023-07-24 DIAGNOSIS — R41841 Cognitive communication deficit: Secondary | ICD-10-CM | POA: Diagnosis not present

## 2023-07-24 DIAGNOSIS — R2681 Unsteadiness on feet: Secondary | ICD-10-CM | POA: Diagnosis not present

## 2023-07-24 DIAGNOSIS — M6281 Muscle weakness (generalized): Secondary | ICD-10-CM | POA: Diagnosis not present

## 2023-07-26 DIAGNOSIS — R0602 Shortness of breath: Secondary | ICD-10-CM | POA: Diagnosis not present

## 2023-07-26 DIAGNOSIS — M6281 Muscle weakness (generalized): Secondary | ICD-10-CM | POA: Diagnosis not present

## 2023-07-26 DIAGNOSIS — R2681 Unsteadiness on feet: Secondary | ICD-10-CM | POA: Diagnosis not present

## 2023-07-26 DIAGNOSIS — R41841 Cognitive communication deficit: Secondary | ICD-10-CM | POA: Diagnosis not present

## 2023-07-29 DIAGNOSIS — R41841 Cognitive communication deficit: Secondary | ICD-10-CM | POA: Diagnosis not present

## 2023-07-29 DIAGNOSIS — R2681 Unsteadiness on feet: Secondary | ICD-10-CM | POA: Diagnosis not present

## 2023-07-29 DIAGNOSIS — R0602 Shortness of breath: Secondary | ICD-10-CM | POA: Diagnosis not present

## 2023-07-29 DIAGNOSIS — M6281 Muscle weakness (generalized): Secondary | ICD-10-CM | POA: Diagnosis not present

## 2023-07-30 DIAGNOSIS — R0602 Shortness of breath: Secondary | ICD-10-CM | POA: Diagnosis not present

## 2023-07-30 DIAGNOSIS — R2681 Unsteadiness on feet: Secondary | ICD-10-CM | POA: Diagnosis not present

## 2023-07-30 DIAGNOSIS — R41841 Cognitive communication deficit: Secondary | ICD-10-CM | POA: Diagnosis not present

## 2023-07-30 DIAGNOSIS — M6281 Muscle weakness (generalized): Secondary | ICD-10-CM | POA: Diagnosis not present

## 2023-07-31 DIAGNOSIS — R2681 Unsteadiness on feet: Secondary | ICD-10-CM | POA: Diagnosis not present

## 2023-07-31 DIAGNOSIS — R0602 Shortness of breath: Secondary | ICD-10-CM | POA: Diagnosis not present

## 2023-07-31 DIAGNOSIS — M6281 Muscle weakness (generalized): Secondary | ICD-10-CM | POA: Diagnosis not present

## 2023-07-31 DIAGNOSIS — R41841 Cognitive communication deficit: Secondary | ICD-10-CM | POA: Diagnosis not present

## 2023-08-02 DIAGNOSIS — R41841 Cognitive communication deficit: Secondary | ICD-10-CM | POA: Diagnosis not present

## 2023-08-02 DIAGNOSIS — M6281 Muscle weakness (generalized): Secondary | ICD-10-CM | POA: Diagnosis not present

## 2023-08-02 DIAGNOSIS — F330 Major depressive disorder, recurrent, mild: Secondary | ICD-10-CM | POA: Diagnosis not present

## 2023-08-02 DIAGNOSIS — R0602 Shortness of breath: Secondary | ICD-10-CM | POA: Diagnosis not present

## 2023-08-02 DIAGNOSIS — R2681 Unsteadiness on feet: Secondary | ICD-10-CM | POA: Diagnosis not present

## 2023-08-04 DIAGNOSIS — R41841 Cognitive communication deficit: Secondary | ICD-10-CM | POA: Diagnosis not present

## 2023-08-04 DIAGNOSIS — R2681 Unsteadiness on feet: Secondary | ICD-10-CM | POA: Diagnosis not present

## 2023-08-04 DIAGNOSIS — M6281 Muscle weakness (generalized): Secondary | ICD-10-CM | POA: Diagnosis not present

## 2023-08-04 DIAGNOSIS — R0602 Shortness of breath: Secondary | ICD-10-CM | POA: Diagnosis not present

## 2023-08-05 ENCOUNTER — Encounter: Payer: Self-pay | Admitting: Sports Medicine

## 2023-08-05 ENCOUNTER — Non-Acute Institutional Stay: Payer: Medicare Other | Admitting: Sports Medicine

## 2023-08-05 DIAGNOSIS — R0602 Shortness of breath: Secondary | ICD-10-CM

## 2023-08-05 DIAGNOSIS — K5900 Constipation, unspecified: Secondary | ICD-10-CM | POA: Diagnosis not present

## 2023-08-05 DIAGNOSIS — R41841 Cognitive communication deficit: Secondary | ICD-10-CM | POA: Diagnosis not present

## 2023-08-05 DIAGNOSIS — M6281 Muscle weakness (generalized): Secondary | ICD-10-CM | POA: Diagnosis not present

## 2023-08-05 DIAGNOSIS — R2681 Unsteadiness on feet: Secondary | ICD-10-CM | POA: Diagnosis not present

## 2023-08-05 NOTE — Progress Notes (Signed)
Provider:  Venita Sheffield MD Location:   [facility]   Place of Service:   Assisted living  PCP: Venita Sheffield, MD Patient Care Team: Venita Sheffield, MD as PCP - General (Internal Medicine) Verner Chol, [facility] (Inactive) as Pharmacist (Pharmacist)  Extended Emergency Contact Information Primary Emergency Contact: Julio Sicks [place] of [place] Phone: [phone] Relation: Daughter  Code Status:  Goals of Care: Advanced Directive information    07/12/2023    3:37 PM  Advanced Directives  Does Patient Have a Medical Advance Directive? Yes  Type of Estate agent of [place];Living will  Does patient want to make changes to medical advance directive? No - Patient declined  Copy of Healthcare Power of Attorney in Chart? Yes - validated most recent copy scanned in chart (See row information)      No chief complaint on file.   HPI: Patient is a 89 y.o. female seen today for acute visit for constipation  Pt seen and examined in her room.  C/o of hard stools. She has ostomy bag and reports that she needs press on her belly around the stoma to get the stool out.  Denies abdominal pain, nausea, vomiting She had her lunch and tolerated with no problems Says she does not drink much water  Feels her stomach is full and c/o difficulty with her breathing when bending over She is able to speak in full sentences Does not appear to be in distress. Pt has appt with GI on dec 10th  Pt is on norco for pain and receives miralax daily   Past Medical History:  Diagnosis Date   Arthritis    Depression    Hyperlipidemia    Osteopenia    Stroke (HCC)    Thrombocytopenia (HCC)    Vitamin D deficiency    Wrist fracture 2002   left   Past Surgical History:  Procedure Laterality Date   APPENDECTOMY  1967   CARPAL TUNNEL RELEASE  2008   right   COLON RESECTION SIGMOID N/A 12/03/2020   Procedure: EXPLORATORY  LAPAROTOMY; HARTMAN'S PROCEDURE; SPLEENIC FLEXURE MOBILIZATION;  Surgeon: Romie Levee, MD;  Location: [facility];  Service: General;  Laterality: N/A;   COLOSTOMY N/A 12/03/2020   Procedure: COLOSTOMY;  Surgeon: Romie Levee, MD;  Location: [facility];  Service: General;  Laterality: N/A;   JOINT REPLACEMENT  2002   right total     reports that she has never smoked. She has never used smokeless tobacco. She reports that she does not drink alcohol and does not use drugs. Social History   Socioeconomic History   Marital status: Widowed    Spouse name: Not on file   Number of children: 4   Years of education: 16   Highest education level: Bachelor's degree (e.g., BA, AB, BS)  Occupational History   Not on file  Tobacco Use   Smoking status: Never   Smokeless tobacco: Never  Vaping Use   Vaping status: Never Used  Substance and Sexual Activity   Alcohol use: No   Drug use: No   Sexual activity: Not on file  Other Topics Concern   Not on file  Social History Narrative   Lives in senior apartment at [facility]   widowed   Social Determinants of Health   Financial Resource Strain: Low Risk  (10/16/2019)   Overall Financial Resource Strain (CARDIA)    Difficulty of Paying Living Expenses: Not hard at all  Food Insecurity: No Food Insecurity (10/16/2019)  Hunger Vital Sign    Worried About Running Out of Food in the Last Year: Never true    Ran Out of Food in the Last Year: Never true  Transportation Needs: No Transportation Needs (10/16/2019)   PRAPARE - Administrator, Civil Service (Medical): No    Lack of Transportation (Non-Medical): No  Physical Activity: Inactive (10/16/2019)   Exercise Vital Sign    Days of Exercise per Week: 0 days    Minutes of Exercise per Session: 0 min  Stress: No Stress Concern Present (10/16/2019)   [employer] of Occupational Health - Occupational Stress Questionnaire    Feeling of Stress : Only a little  Social Connections: Unknown  (10/16/2019)   Social Connection and Isolation Panel [NHANES]    Frequency of Communication with Friends and Family: More than three times a week    Frequency of Social Gatherings with Friends and Family: Once a week    Attends Religious Services: Not on Marketing executive or Organizations: Yes    Attends Banker Meetings: Not on file    Marital Status: Widowed  Intimate Partner Violence: Not on file    Functional Status Survey:    Family History  Problem Relation Age of Onset   Breast cancer Mother    Breast cancer Sister     Health Maintenance  Topic Date Due   INFLUENZA VACCINE  04/11/2023   COVID-19 Vaccine (6 - 2023-24 season) 05/12/2023   Medicare Annual Wellness (AWV)  09/25/2023   Pneumonia Vaccine 28+ Years old  Completed   DEXA SCAN  Completed   Zoster Vaccines- Shingrix  Completed   HPV VACCINES  Aged Out   DTaP/Tdap/Td  Discontinued    Allergies  Allergen Reactions   Bactrim [Sulfamethoxazole-Trimethoprim] Nausea Only   Ditropan [Oxybutynin]     Made patient feel bad   Sulfa Antibiotics     Other reaction(s): stomach upset    Outpatient Encounter Medications as of 08/05/2023  Medication Sig   acetaminophen (TYLENOL) 325 MG tablet Take 650 mg by mouth every 6 (six) hours as needed for fever.   acetaminophen (TYLENOL) 325 MG tablet Take 650 mg by mouth 3 (three) times daily. SCHEDULED, See as needed dosing also   Alum & Mag Hydroxide-Simeth (ANTACID & ANTIGAS PO) Take 30 mLs by mouth every 4 (four) hours as needed. for Indigestion   amLODipine (NORVASC) 5 MG tablet Take 5 mg by mouth daily.   aspirin EC 81 MG tablet Take 81 mg by mouth daily. Swallow whole.   calcium carbonate (OSCAL) 1500 (600 Ca) MG TABS tablet Take 600 mg of elemental calcium by mouth daily.   Cholecalciferol (VITAMIN D3) 2000 units capsule Take 2,000 Units by mouth daily.   diclofenac Sodium (VOLTAREN ARTHRITIS PAIN) 1 % GEL Apply 2 g topically 3 (three) times  daily as needed (Left wrist).   fluticasone (FLONASE) 50 MCG/ACT nasal spray Place 1 spray into both nostrils 2 (two) times daily.   HYDROcodone-acetaminophen (NORCO/VICODIN) 5-325 MG tablet Take 0.5 tablets by mouth daily. As needed for pain   lidocaine 4 % Place 1 patch onto the skin daily. Apply at bedtime and remove in the am   loperamide (IMODIUM A-D) 2 MG tablet Take 2 mg by mouth as needed for diarrhea or loose stools.   LORazepam (ATIVAN) 0.5 MG tablet Take 0.5 mg by mouth every 8 (eight) hours as needed for anxiety.   Magnesium 300 MG CAPS Take  300 mg by mouth daily.   magnesium hydroxide (MILK OF MAGNESIA) 400 MG/5ML suspension Take 30 mLs by mouth daily as needed for mild constipation.   minoxidil (ROGAINE) 2 % external solution Apply topically daily as needed.   pantoprazole (PROTONIX) 40 MG tablet Take 40 mg by mouth daily.   polyethylene glycol (MIRALAX / GLYCOLAX) 17 g packet Take 17 g by mouth as needed.   polyethylene glycol (MIRALAX / GLYCOLAX) 17 g packet Take 17 g by mouth 2 (two) times daily.   potassium chloride SA (KLOR-CON) 20 MEQ tablet Take 20 mEq by mouth daily.   Propylene Glycol (SYSTANE BALANCE OP) Apply 1 drop to eye every evening. related to PRESENCE OF INTRAOCULAR LENS (Z96.1) wait 10 minutes between Systane and timolol drops   rosuvastatin (CRESTOR) 10 MG tablet Take 10 mg by mouth at bedtime.   simethicone (MYLICON) 80 MG chewable tablet Chew 1 tablet (80 mg total) by mouth 4 (four) times daily.   timolol (TIMOPTIC) 0.5 % ophthalmic solution Place 1 drop into the left eye 2 (two) times daily. related to PRESENCE OF INTRAOCULAR LENS (Z96.1) Wait 10 minutes between Systane and timolol drops   venlafaxine XR (EFFEXOR-XR) 75 MG 24 hr capsule TAKE 2 CAPSULES (150MG ) BY MOUTH DAILY.   vitamin B-12 (CYANOCOBALAMIN) 1000 MCG tablet Take 1,000 mcg by mouth daily.   No facility-administered encounter medications on file as of 08/05/2023.    Review of Systems   Constitutional:  Negative for fever.  HENT:  Negative for sinus pressure and sore throat.   Respiratory:  Negative for cough, shortness of breath and wheezing.   Cardiovascular:  Negative for chest pain, palpitations and leg swelling.  Gastrointestinal:  Positive for constipation. Negative for abdominal distention, abdominal pain, blood in stool, diarrhea, nausea and vomiting.  Genitourinary:  Negative for dysuria, frequency and urgency.  Neurological:  Negative for dizziness.    There were no vitals filed for this visit. There is no height or weight on file to calculate BMI. Physical Exam Constitutional:      Appearance: Normal appearance.  Cardiovascular:     Rate and Rhythm: Normal rate and regular rhythm.     Heart sounds: No murmur heard. Pulmonary:     Effort: Pulmonary effort is normal. No respiratory distress.     Breath sounds: Normal breath sounds. No wheezing.  Abdominal:     General: Bowel sounds are normal. There is no distension.     Tenderness: There is no abdominal tenderness. There is no guarding or rebound.     Comments: Ostomy in place , no erythema  BS +  No tenderness on palpation  Musculoskeletal:        General: Swelling (1+ pitting) present. No tenderness.  Skin:    General: Skin is dry.  Neurological:     Mental Status: She is alert. Mental status is at baseline.     Sensory: No sensory deficit.     Motor: No weakness.     Labs reviewed: Basic Metabolic Panel: Recent Labs    05/30/23 0000 06/21/23 0000  NA 138 134*  K 3.8 3.9  CL 103 99  CO2 25* 26*  BUN 10 15  CREATININE 0.6 0.7  CALCIUM 9.4 9.2   Liver Function Tests: Recent Labs    05/30/23 0000 06/25/23 0000  AST 16 17  ALT 13 15  ALKPHOS 72 71  ALBUMIN 3.9 3.7   No results for input(s): "LIPASE", "AMYLASE" in the last 8760 hours. No results  for input(s): "AMMONIA" in the last 8760 hours. CBC: Recent Labs    05/30/23 0000 06/21/23 0000  WBC 5.4 6.0  NEUTROABS 3,434.00  3,630.00  HGB 12.9 13.7  HCT 39 43  PLT 125* 135*   Cardiac Enzymes: No results for input(s): "CKTOTAL", "CKMB", "CKMBINDEX", "TROPONINI" in the last 8760 hours. BNP: Invalid input(s): "POCBNP" Lab Results  Component Value Date   HGBA1C 5.9 (H) 09/26/2015   Lab Results  Component Value Date   TSH 1.43 05/30/2023   Lab Results  Component Value Date   VITAMINB12 942 12/21/2020   No results found for: "FOLATE" Lab Results  Component Value Date   IRON 22 (L) 04/22/2015   TIBC 402 04/22/2015   FERRITIN 23 04/22/2015    Imaging and Procedures obtained prior to SNF admission: MM 3D SCREEN BREAST BILATERAL  Result Date: 09/28/2022 CLINICAL DATA:  Screening. EXAM: DIGITAL SCREENING BILATERAL MAMMOGRAM WITH TOMOSYNTHESIS AND CAD TECHNIQUE: Bilateral screening digital craniocaudal and mediolateral oblique mammograms were obtained. Bilateral screening digital breast tomosynthesis was performed. The images were evaluated with computer-aided detection. COMPARISON:  Previous exam(s). ACR Breast Density Category b: There are scattered areas of fibroglandular density. FINDINGS: There are no findings suspicious for malignancy. IMPRESSION: No mammographic evidence of malignancy. A result letter of this screening mammogram will be mailed directly to the patient. RECOMMENDATION: Screening mammogram in one year. (Code:SM-B-01Y) BI-RADS CATEGORY  1: Negative. Electronically Signed   By: Sherian Rein M.D.   On: 09/28/2022 13:22    Assessment/Plan  1. Constipation, unspecified constipation type Instructed patient to increase oral hydration  Will start colace 100mg  tid  Cont with miralax Increase physical activity  Follow up with GI  CT abd/ pelvis pending   2. SOB (shortness of breath) Pt does not appear to be in distress Able to speak in full sentences Lungs clear  Minimal lower extremity swelling Will order chest x ray and BNP    Family/ staff Communication:  Care plan discussed with  the nursing staff  Labs/tests ordered: chest x ray , bnp    30 Total time spent for obtaining history,  performing a medically appropriate examination and evaluation, reviewing the tests, , ordering medications, tests, or procedures,   , documenting clinical information in the electronic or other health record, independently interpreting results and communicating results to the patient/family/caregiver, care coordination (not separately reported)

## 2023-08-06 DIAGNOSIS — R0602 Shortness of breath: Secondary | ICD-10-CM | POA: Diagnosis not present

## 2023-08-06 DIAGNOSIS — I480 Paroxysmal atrial fibrillation: Secondary | ICD-10-CM | POA: Diagnosis not present

## 2023-08-07 DIAGNOSIS — R0602 Shortness of breath: Secondary | ICD-10-CM | POA: Diagnosis not present

## 2023-08-07 DIAGNOSIS — M6281 Muscle weakness (generalized): Secondary | ICD-10-CM | POA: Diagnosis not present

## 2023-08-07 DIAGNOSIS — R2681 Unsteadiness on feet: Secondary | ICD-10-CM | POA: Diagnosis not present

## 2023-08-07 DIAGNOSIS — R41841 Cognitive communication deficit: Secondary | ICD-10-CM | POA: Diagnosis not present

## 2023-08-09 DIAGNOSIS — R41841 Cognitive communication deficit: Secondary | ICD-10-CM | POA: Diagnosis not present

## 2023-08-09 DIAGNOSIS — R0602 Shortness of breath: Secondary | ICD-10-CM | POA: Diagnosis not present

## 2023-08-09 DIAGNOSIS — R2681 Unsteadiness on feet: Secondary | ICD-10-CM | POA: Diagnosis not present

## 2023-08-09 DIAGNOSIS — M6281 Muscle weakness (generalized): Secondary | ICD-10-CM | POA: Diagnosis not present

## 2023-08-13 DIAGNOSIS — R41841 Cognitive communication deficit: Secondary | ICD-10-CM | POA: Diagnosis not present

## 2023-08-20 ENCOUNTER — Ambulatory Visit: Payer: Medicare Other | Admitting: Gastroenterology

## 2023-08-20 ENCOUNTER — Encounter: Payer: Self-pay | Admitting: Gastroenterology

## 2023-08-20 VITALS — BP 118/70 | HR 90 | Ht 63.0 in | Wt 177.0 lb

## 2023-08-20 DIAGNOSIS — Z7189 Other specified counseling: Secondary | ICD-10-CM

## 2023-08-20 DIAGNOSIS — R140 Abdominal distension (gaseous): Secondary | ICD-10-CM | POA: Diagnosis not present

## 2023-08-20 DIAGNOSIS — K5900 Constipation, unspecified: Secondary | ICD-10-CM | POA: Diagnosis not present

## 2023-08-20 DIAGNOSIS — K9409 Other complications of colostomy: Secondary | ICD-10-CM

## 2023-08-20 DIAGNOSIS — Z933 Colostomy status: Secondary | ICD-10-CM

## 2023-08-20 MED ORDER — SENNA 8.6 MG PO CAPS: ORAL_CAPSULE | ORAL | Status: DC

## 2023-08-20 NOTE — Progress Notes (Signed)
Discussed the use of AI scribe software for clinical note transcription with the patient, who gave verbal consent to proceed.  HPI : Sara Logan is a 89 y.o. female with a history of stroke, thrombocytopenia, depression and complicated diverticulitis status post Luz Brazen with colostomy who is referred to [place] by Venita Sheffield, *for further evaluation and management of abdominal discomfort, bloating and constipation.  The patient lives in an assisted living facility [facility]).  In 2022, the patient was having issues with abdominal pain, and a CT scan showed diverticulitis with frank perforation and colonic obstruction with some features of possible ischemic bowel.  She underwent emergent surgery with diverting colostomy.  She has overall been doing well with her ostomy, but in the past several months she has been having issues with abdominal bloating/distention, hard stools and difficulty evacuating stool into her ostomy bag.  In addition, she has been having issues with her appliance, with the device becoming unattached from her skin and having to be reapplied.  She has not seen her surgeon or an ostomy nurse in over a year. The patient states that she started having these issues after a fall in August.  She was placed on narcotic pain medications for a period, but is currently not taking any pain medicines. She reports that her appetite is good and her weight is stable.  She denies any problems with nausea or vomiting.  No problems with diarrhea or blood in the stool.  She denies any issues with stool feeling stuck when her stool is soft.  She has been having some issues with feeling short of breath, but she thinks that this is from abdominal distention.  She has had a normal chest x-ray and normal BNP.  She has had abdominal plain films which have shown varying degrees of colonic stool burden. Her constipation has been treated with MiraLAX twice daily.  She is aware that she does not drink  enough water, but states that this is because of her issues with urinary urgency and incontinence.  She has limited control over her diet due to living in an assisted living facility, but is aware of the need for fiber in her diet.  The patient also reports occasional mucus discharge from the rectum, which seems to have increased since her fall.  No blood or stool per rectum.    Past Medical History:  Diagnosis Date   Arthritis    COVID    Depression    Hyperlipidemia    Osteopenia    Stroke (HCC)    Thrombocytopenia (HCC)    Vitamin D deficiency    Wrist fracture 09/10/2000   left     Past Surgical History:  Procedure Laterality Date   APPENDECTOMY  1967   CARPAL TUNNEL RELEASE  2008   right   COLON RESECTION SIGMOID N/A 12/03/2020   Procedure: EXPLORATORY LAPAROTOMY; HARTMAN'S PROCEDURE; SPLEENIC FLEXURE MOBILIZATION;  Surgeon: Romie Levee, MD;  Location: [facility];  Service: General;  Laterality: N/A;   COLOSTOMY N/A 12/03/2020   Procedure: COLOSTOMY;  Surgeon: Romie Levee, MD;  Location: [facility];  Service: General;  Laterality: N/A;   JOINT REPLACEMENT  2002   right total    Family History  Problem Relation Age of Onset   Breast cancer Mother    Breast cancer Sister    Other Son        unsure of cause   Arthritis Son    Ovarian cancer Daughter    Crohn's disease Daughter  Colon cancer Neg Hx    Esophageal cancer Neg Hx    Social History   Tobacco Use   Smoking status: Never   Smokeless tobacco: Never  Vaping Use   Vaping status: Never Used  Substance Use Topics   Alcohol use: No   Drug use: No   Current Outpatient Medications  Medication Sig Dispense Refill   acetaminophen (TYLENOL) 325 MG tablet Take 650 mg by mouth 3 (three) times daily. SCHEDULED, See as needed dosing also     Alum & Mag Hydroxide-Simeth (ANTACID & ANTIGAS PO) Take 30 mLs by mouth every 4 (four) hours as needed. for Indigestion     amLODipine (NORVASC) 5 MG tablet Take 5 mg by mouth  daily.     aspirin EC 81 MG tablet Take 81 mg by mouth daily. Swallow whole.     calcium carbonate (OSCAL) 1500 (600 Ca) MG TABS tablet Take 600 mg of elemental calcium by mouth daily.     Cholecalciferol (VITAMIN D3) 2000 units capsule Take 2,000 Units by mouth daily.     diclofenac Sodium (VOLTAREN ARTHRITIS PAIN) 1 % GEL Apply 2 g topically 3 (three) times daily as needed (Left wrist).     fluticasone (FLONASE) 50 MCG/ACT nasal spray Place 1 spray into both nostrils 2 (two) times daily.     loperamide (IMODIUM A-D) 2 MG tablet Take 2 mg by mouth as needed for diarrhea or loose stools.     Magnesium 300 MG CAPS Take 300 mg by mouth daily.     magnesium hydroxide (MILK OF MAGNESIA) 400 MG/5ML suspension Take 30 mLs by mouth daily as needed for mild constipation.     minoxidil (ROGAINE) 2 % external solution Apply topically daily as needed.     pantoprazole (PROTONIX) 40 MG tablet Take 40 mg by mouth daily.     polyethylene glycol (MIRALAX / GLYCOLAX) 17 g packet Take 17 g by mouth 2 (two) times daily.     potassium chloride SA (KLOR-CON) 20 MEQ tablet Take 20 mEq by mouth daily.     Propylene Glycol (SYSTANE BALANCE OP) Apply 1 drop to eye every evening. related to PRESENCE OF INTRAOCULAR LENS (Z96.1) wait 10 minutes between Systane and timolol drops     rosuvastatin (CRESTOR) 10 MG tablet Take 10 mg by mouth at bedtime.     simethicone (MYLICON) 80 MG chewable tablet Chew 1 tablet (80 mg total) by mouth 4 (four) times daily.     timolol (TIMOPTIC) 0.5 % ophthalmic solution Place 1 drop into the left eye 2 (two) times daily. related to PRESENCE OF INTRAOCULAR LENS (Z96.1) Wait 10 minutes between Systane and timolol drops     venlafaxine XR (EFFEXOR-XR) 75 MG 24 hr capsule TAKE 2 CAPSULES (150MG ) BY MOUTH DAILY. 60 capsule 5   vitamin B-12 (CYANOCOBALAMIN) 1000 MCG tablet Take 1,000 mcg by mouth daily.     LORazepam (ATIVAN) 0.5 MG tablet Take 0.5 mg by mouth every 8 (eight) hours as needed for  anxiety.     No current facility-administered medications for this visit.   Allergies  Allergen Reactions   Bactrim [Sulfamethoxazole-Trimethoprim] Nausea Only   Ditropan [Oxybutynin]     Made patient feel bad   Sulfa Antibiotics     Other reaction(s): stomach upset     Review of Systems: All systems reviewed and negative except where noted in HPI.    No results found.  Physical Exam: BP 118/70   Pulse 90   Ht 5\' 3"  (  1.6 m)   Wt 177 lb (80.3 kg)   BMI 31.35 kg/m  Constitutional: Pleasant,well-developed, Caucasian female in no acute distress.  Uses forward wheeled walker for ambulation HEENT: Normocephalic and atraumatic. Conjunctivae are normal. No scleral icterus. Neck supple.  Cardiovascular: Normal rate, regular rhythm.  Pulmonary/chest: Effort normal and breath sounds normal. No wheezing, rales or rhonchi. Abdominal: Soft, mild distention, nontender. Bowel sounds hyper active throughout.  Laparotomy scar present.  Left-sided ostomy with soft brown stool present in bag.  Skin surrounding ostomy nonerythematous. Extremities: no edema Neurological: Alert and oriented to person place and time. Skin: Skin is warm and dry. No rashes noted. Psychiatric: Normal mood and affect. Behavior is normal.  CBC    Component Value Date/Time   WBC 6.0 06/21/2023 0000   WBC 8.8 12/14/2020 0410   RBC 4.53 06/21/2023 0000   HGB 13.7 06/21/2023 0000   HCT 43 06/21/2023 0000   PLT 135 (A) 06/21/2023 0000   MCV 94.0 12/14/2020 0410   MCH 28.7 12/14/2020 0410   MCHC 30.5 12/14/2020 0410   RDW 17.5 (H) 12/14/2020 0410   LYMPHSABS 0.8 12/12/2020 0325   MONOABS 0.7 12/12/2020 0325   EOSABS 0.3 12/12/2020 0325   BASOSABS 0.0 12/12/2020 0325    CMP     Component Value Date/Time   NA 134 (A) 06/21/2023 0000   K 3.9 06/21/2023 0000   CL 99 06/21/2023 0000   CO2 26 (A) 06/21/2023 0000   GLUCOSE 116 (H) 12/14/2020 0410   BUN 15 06/21/2023 0000   CREATININE 0.7 06/21/2023 0000    CREATININE 0.45 12/14/2020 0410   CREATININE 0.67 04/26/2020 1158   CALCIUM 9.2 06/21/2023 0000   PROT 5.3 (L) 12/12/2020 0325   ALBUMIN 3.7 06/25/2023 0000   AST 17 06/25/2023 0000   ALT 15 06/25/2023 0000   ALKPHOS 71 06/25/2023 0000   BILITOT 0.3 12/12/2020 0325   GFRNONAA 81 01/24/2021 2359   GFRNONAA >60 12/14/2020 0410   GFRAA 94 01/24/2021 2359       Latest Ref Rng & Units 06/21/2023   12:00 AM 05/30/2023   12:00 AM 04/17/2022   12:00 AM  CBC EXTENDED  WBC  6.0     5.4       RBC 3.87 - 5.11 4.53     4.19     4.17      Hemoglobin 12.0 - 16.0 13.7     12.9     13.1      HCT 36 - 46 43     39     39      Platelets 150 - 400 K/uL 135     125     108      NEUT#  3,630.00     3,434.00     2,167.00         This result is from an external source.      ASSESSMENT AND PLAN:  89 year old female with history of complicated sigmoid diverticulitis status post Hartman's in 2022, with recent development of persistent abdominal bloating, distention and hard stools with difficulty evacuating stool into ostomy bag.  These changes in bowel habits seem to occur following a fall, in which she required narcotic pain medications for a period.  However, her bowel habits have not returned to normal since cessation of the narcotics.  I suspect that her abdominal distention, bloating and abdominal discomfort is related to constipation, and the symptoms should improve with an adequate bowel regimen.  Currently,  she is on MiraLAX twice daily, but still has issues with hard stools and difficulty getting stool into her ostomy. I suggest that she increase her MiraLAX to 3 times per day, and to make some dietary changes to include prune juice every morning, try prunes as snacks, increase utilization of kiwi fruit into her diet, and to use Senokot on an as-needed basis.  When she has gone 2 or 3 days without a satisfactory bowel movement, I recommend she take 2 tabs of Senokot at night. With regards to her  difficulty with her ostomy appliance, I informed her that I do not have any advanced training and ostomy management.  Is not clear to me why her appliance would keep malfunctioning because of constipation or gas.  It appears to be in the correct position and functioning well on my exam today.  I advised her to meet with an ostomy nurse for further education and guidance on ongoing ostomy management.  Constipation - Increase Miralax to three times daily - Recommend daily prune juice - Recommend dried prunes as a snack - Recommend kiwi fruit if available - Prescribe Senna 8.6 mg PRN at bedtime if no bowel movement in three days   Ostomy Complications Ostomy bag detachment possibly due to gas buildup and improper drainage, causing skin irritation. Experiences abdominal pressure and difficulty breathing, possibly due to gas and stool buildup.  - Refer to ostomy nurse for evaluation and management -Dietary medication changes as above to hopefully improve stool regularity and reduce bloating/distention  Nyellie Yetter E. Tomasa Rand, MD [facility]  I spent a total of 45 minutes reviewing the patient's medical record, interviewing and examining the patient, discussing her diagnosis and management of her condition going forward, and documenting in the medical record    Veludandi, Elvis Coil, *

## 2023-08-20 NOTE — Patient Instructions (Addendum)
If your blood pressure at your visit was 140/90 or greater, please contact your primary care physician to follow up on this. ______________________________________________________  If you are age 87 or older, your body mass index should be between 23-30. Your Body mass index is 31.35 kg/m. If this is out of the aforementioned range listed, please consider follow up with your Primary Care Provider.  If you are age 89 or younger, your body mass index should be between 19-25. Your Body mass index is 31.35 kg/m. If this is out of the aformentioned range listed, please consider follow up with your Primary Care Provider.  ________________________________________________________  The [facility] providers would like to encourage you to use [facility] to communicate with providers for non-urgent requests or questions.  Due to long hold times on the telephone, sending your provider a message by [facility] may be a faster and more efficient way to get a response.  Please allow 48 business hours for a response.  Please remember that this is for non-urgent requests.  _______________________________________________________  Due to recent changes in healthcare laws, you may see the results of your imaging and laboratory studies on MyChart before your provider has had a chance to review them.  We understand that in some cases there may be results that are confusing or concerning to you. Not all laboratory results come back in the same time frame and the provider may be waiting for multiple results in order to interpret others.  Please give [place] 48 hours in order for your provider to thoroughly review all the results before contacting the office for clarification of your results.    Increase Miralax to three times a day.  I recommend you drink prune juice daily or eat prunes or kiwi fruit as a snack.  If no bowel movements in 3 days, take 1 to 2 tablets of Senna (8.6 mg) at bedtime as needed.  We will refer you to the  [facility].  They will reach out to you to schedule an appointment.   Thank you for entrusting me with your care and for choosing [facility],  Dr. Tiajuana Amass

## 2023-08-22 DIAGNOSIS — H401421 Capsular glaucoma with pseudoexfoliation of lens, left eye, mild stage: Secondary | ICD-10-CM | POA: Diagnosis not present

## 2023-08-22 DIAGNOSIS — H04123 Dry eye syndrome of bilateral lacrimal glands: Secondary | ICD-10-CM | POA: Diagnosis not present

## 2023-08-22 DIAGNOSIS — Z961 Presence of intraocular lens: Secondary | ICD-10-CM | POA: Diagnosis not present

## 2023-08-22 DIAGNOSIS — R41841 Cognitive communication deficit: Secondary | ICD-10-CM | POA: Diagnosis not present

## 2023-08-27 ENCOUNTER — Ambulatory Visit (HOSPITAL_COMMUNITY)
Admission: RE | Admit: 2023-08-27 | Discharge: 2023-08-27 | Disposition: A | Discharge status: Home / Self Care | Payer: Medicare Other | Source: Ambulatory Visit | Attending: Sports Medicine | Admitting: Sports Medicine

## 2023-08-27 DIAGNOSIS — K5904 Chronic idiopathic constipation: Secondary | ICD-10-CM | POA: Diagnosis not present

## 2023-08-27 DIAGNOSIS — Z433 Encounter for attention to colostomy: Secondary | ICD-10-CM

## 2023-08-27 DIAGNOSIS — K5900 Constipation, unspecified: Secondary | ICD-10-CM | POA: Diagnosis not present

## 2023-08-27 DIAGNOSIS — L24B3 Irritant contact dermatitis related to fecal or urinary stoma or fistula: Secondary | ICD-10-CM

## 2023-08-27 DIAGNOSIS — Z933 Colostomy status: Secondary | ICD-10-CM | POA: Insufficient documentation

## 2023-08-27 DIAGNOSIS — R140 Abdominal distension (gaseous): Secondary | ICD-10-CM | POA: Insufficient documentation

## 2023-08-27 NOTE — Progress Notes (Signed)
[facility]   Reason for visit:  LLQ colostomy  Has been having some leaks lately and is distressed.   HPI:  Diverticulitis with end colostomy.   Past Medical History:  Diagnosis Date  . Arthritis   . COVID   . Depression   . Hyperlipidemia   . Osteopenia   . Stroke (HCC)   . Thrombocytopenia (HCC)   . Vitamin D deficiency   . Wrist fracture 09/10/2000   left   Family History  Problem Relation Age of Onset  . Breast cancer Mother   . Breast cancer Sister   . Other Son        unsure of cause  . Arthritis Son   . Ovarian cancer Daughter   . Crohn's disease Daughter   . Colon cancer Neg Hx   . Esophageal cancer Neg Hx    Allergies  Allergen Reactions  . Bactrim [Sulfamethoxazole-Trimethoprim] Nausea Only  . Ditropan [Oxybutynin]     Made patient feel bad  . Sulfa Antibiotics     Other reaction(s): stomach upset   Current Outpatient Medications  Medication Sig Dispense Refill Last Dose/Taking  . acetaminophen (TYLENOL) 325 MG tablet Take 650 mg by mouth 3 (three) times daily. SCHEDULED, See as needed dosing also     . Alum & Mag Hydroxide-Simeth (ANTACID & ANTIGAS PO) Take 30 mLs by mouth every 4 (four) hours as needed. for Indigestion     . amLODipine (NORVASC) 5 MG tablet Take 5 mg by mouth daily.     Marland Kitchen aspirin EC 81 MG tablet Take 81 mg by mouth daily. Swallow whole.     . calcium carbonate (OSCAL) 1500 (600 Ca) MG TABS tablet Take 600 mg of elemental calcium by mouth daily.     . Cholecalciferol (VITAMIN D3) 2000 units capsule Take 2,000 Units by mouth daily.     . diclofenac Sodium (VOLTAREN ARTHRITIS PAIN) 1 % GEL Apply 2 g topically 3 (three) times daily as needed (Left wrist).     . fluticasone (FLONASE) 50 MCG/ACT nasal spray Place 1 spray into both nostrils 2 (two) times daily.     Marland Kitchen loperamide (IMODIUM A-D) 2 MG tablet Take 2 mg by mouth as needed for diarrhea or loose stools.     Marland Kitchen LORazepam (ATIVAN) 0.5 MG tablet Take 0.5 mg by mouth every 8  (eight) hours as needed for anxiety.     . Magnesium 300 MG CAPS Take 300 mg by mouth daily.     . magnesium hydroxide (MILK OF MAGNESIA) 400 MG/5ML suspension Take 30 mLs by mouth daily as needed for mild constipation.     . minoxidil (ROGAINE) 2 % external solution Apply topically daily as needed.     . pantoprazole (PROTONIX) 40 MG tablet Take 40 mg by mouth daily.     . polyethylene glycol (MIRALAX / GLYCOLAX) 17 g packet Take 17 g by mouth 3 (three) times daily.     . potassium chloride SA (KLOR-CON) 20 MEQ tablet Take 20 mEq by mouth daily.     Marland Kitchen Propylene Glycol (SYSTANE BALANCE OP) Apply 1 drop to eye every evening. related to PRESENCE OF INTRAOCULAR LENS (Z96.1) wait 10 minutes between Systane and timolol drops     . rosuvastatin (CRESTOR) 10 MG tablet Take 10 mg by mouth at bedtime.     . Sennosides (SENNA) 8.6 MG CAPS Take one to two capsules as needed at bedtime If no bowel movements in 3 days     .  simethicone (MYLICON) 80 MG chewable tablet Chew 1 tablet (80 mg total) by mouth 4 (four) times daily.     . timolol (TIMOPTIC) 0.5 % ophthalmic solution Place 1 drop into the left eye 2 (two) times daily. related to PRESENCE OF INTRAOCULAR LENS (Z96.1) Wait 10 minutes between Systane and timolol drops     . venlafaxine XR (EFFEXOR-XR) 75 MG 24 hr capsule TAKE 2 CAPSULES (150MG ) BY MOUTH DAILY. 60 capsule 5   . vitamin B-12 (CYANOCOBALAMIN) 1000 MCG tablet Take 1,000 mcg by mouth daily.      No current facility-administered medications for this encounter.   ROS  Review of Systems  Constitutional:        Has had some  increase in weight  Gastrointestinal:  Positive for constipation.       LLQ colostomy Will implement increased water, fiber and colace to improve thick stool   Skin:  Positive for color change.       Peristomal irritation from frequent pouch changes due to leaks.  Protected with stoma powder and skin prep.  Patient is familiar with these products.    Psychiatric/Behavioral: Negative.    All other systems reviewed and are negative. Vital signs:  BP (!) 153/81 (BP Location: Right Arm)   Pulse (!) 103   Temp 97.9 F (36.6 C) (Oral)   Resp 20   SpO2 97%  Exam:  Physical Exam Vitals reviewed.  Constitutional:      Appearance: She is obese.  HENT:     Mouth/Throat:     Mouth: Mucous membranes are moist.  Cardiovascular:     Rate and Rhythm: Normal rate and regular rhythm.  Pulmonary:     Effort: Pulmonary effort is normal.     Breath sounds: Normal breath sounds.  Abdominal:     Palpations: Abdomen is soft.  Musculoskeletal:     Comments: Uses wheelchair inside hospital  Skin:    General: Skin is warm and dry.     Comments: Peristomal breakdown  Neurological:     Mental Status: She is alert and oriented to person, place, and time.  Psychiatric:        Mood and Affect: Mood normal.    Stoma type/location:  LLQ colostomy Stomal assessment/size:  1 1/4"  Peristomal assessment:  some creasing.  Change in weight and abdominal topography is changed.  This may be contributing to leaks.  Will implement convex one piece system to create more secure fit.  Treatment options for stomal/peristomal skin: Stoma powder and skin prep  Add barrier ring  convex barrier and will implement barrier strips.  Does not want to try a belt at this time.   Output: soft brown stool.  Ostomy pouching: 1pc. convex Education provided:  Performed pouch change with new 1 piece appliance.  Patient with notable difficulty cutting appliance.  Will request presized pouch so she does not have to cut opening.  Demonstrate barrier strips and purpose. She is optimistic this will improve her pouching and wear time.     Impression/dx  Colostomy Contact irritant dermatitis Discussion  See above  new pouching options tried today.  Samples provided.  Plan  Will update comfort medical once patient confirms she agrees with new changes.     Visit time: 55  minutes.   Mike Gip FNP-BC

## 2023-08-27 NOTE — Discharge Instructions (Addendum)
Comfort MEdical Recommend 1 piece convex pouch (COLOPLAST)  *PResized to 28 mm (no cutting) COntinue stoma powder and skin prep Add barrier ring  [id number] Use barrier strips  [id number]

## 2023-08-29 DIAGNOSIS — R41841 Cognitive communication deficit: Secondary | ICD-10-CM | POA: Diagnosis not present

## 2023-09-02 ENCOUNTER — Encounter: Payer: Self-pay | Admitting: Nurse Practitioner

## 2023-09-02 ENCOUNTER — Other Ambulatory Visit (HOSPITAL_COMMUNITY): Payer: Self-pay | Admitting: Nurse Practitioner

## 2023-09-02 ENCOUNTER — Non-Acute Institutional Stay: Payer: Medicare Other | Admitting: Nurse Practitioner

## 2023-09-02 DIAGNOSIS — L24B3 Irritant contact dermatitis related to fecal or urinary stoma or fistula: Secondary | ICD-10-CM

## 2023-09-02 DIAGNOSIS — Z780 Asymptomatic menopausal state: Secondary | ICD-10-CM | POA: Diagnosis not present

## 2023-09-02 DIAGNOSIS — M8580 Other specified disorders of bone density and structure, unspecified site: Secondary | ICD-10-CM

## 2023-09-02 DIAGNOSIS — Z0000 Encounter for general adult medical examination without abnormal findings: Secondary | ICD-10-CM

## 2023-09-02 DIAGNOSIS — I872 Venous insufficiency (chronic) (peripheral): Secondary | ICD-10-CM | POA: Diagnosis not present

## 2023-09-02 DIAGNOSIS — Z433 Encounter for attention to colostomy: Secondary | ICD-10-CM

## 2023-09-02 NOTE — Assessment & Plan Note (Signed)
Furosemide 10mg ,  Kcl po daily, BMP one week

## 2023-09-02 NOTE — Progress Notes (Signed)
Subjective:   Sara Logan is a 89 y.o. female who presents for Medicare Annual (Subsequent) preventive examination.  Visit Complete: In person  Patient Medicare AWV questionnaire was completed by the patient on 09/02/23; I have confirmed that all information answered by patient is correct and no changes since this date.        Objective:    Today's Vitals   09/02/23 1134 09/02/23 1357  BP: (!) 140/70   Pulse: 70   Resp: 18   Temp: (!) 97.4 F (36.3 C)   SpO2: 98%   Weight: 174 lb (78.9 kg)   PainSc:  5    Body mass index is 30.82 kg/m.     07/12/2023    3:37 PM 06/24/2023    1:44 PM 06/21/2023    4:19 PM 06/18/2023    9:35 AM 06/06/2023    2:22 PM 05/17/2023    9:46 AM 12/31/2022   12:02 PM  Advanced Directives  Does Patient Have a Medical Advance Directive? Yes Yes Yes Yes Yes Yes Yes  Type of Estate agent of [place];Living will Healthcare Power of [place];Living will Healthcare Power of [place];Living will Living will;Healthcare Power of Attorney Living will;Healthcare Power of Attorney Living will;Healthcare Power of [employer] Power of [place];Living will  Does patient want to make changes to medical advance directive? No - Patient declined No - Patient declined No - Patient declined No - Patient declined No - Patient declined No - Patient declined No - Patient declined  Copy of Healthcare Power of Attorney in Chart? Yes - validated most recent copy scanned in chart (See row information) Yes - validated most recent copy scanned in chart (See row information) Yes - validated most recent copy scanned in chart (See row information) Yes - validated most recent copy scanned in chart (See row information) Yes - validated most recent copy scanned in chart (See row information) Yes - validated most recent copy scanned in chart (See row information) Yes - validated most recent copy scanned in chart (See row information)    Current Medications  (verified) Outpatient Encounter Medications as of 09/02/2023  Medication Sig   acetaminophen (TYLENOL) 325 MG tablet Take 650 mg by mouth 3 (three) times daily. SCHEDULED, See as needed dosing also   Alum & Mag Hydroxide-Simeth (ANTACID & ANTIGAS PO) Take 30 mLs by mouth every 4 (four) hours as needed. for Indigestion   amLODipine (NORVASC) 5 MG tablet Take 5 mg by mouth daily.   aspirin EC 81 MG tablet Take 81 mg by mouth daily. Swallow whole.   calcium carbonate (OSCAL) 1500 (600 Ca) MG TABS tablet Take 600 mg of elemental calcium by mouth daily.   Cholecalciferol (VITAMIN D3) 2000 units capsule Take 2,000 Units by mouth daily.   diclofenac Sodium (VOLTAREN ARTHRITIS PAIN) 1 % GEL Apply 2 g topically 3 (three) times daily as needed (Left wrist).   fluticasone (FLONASE) 50 MCG/ACT nasal spray Place 1 spray into both nostrils 2 (two) times daily.   loperamide (IMODIUM A-D) 2 MG tablet Take 2 mg by mouth as needed for diarrhea or loose stools.   LORazepam (ATIVAN) 0.5 MG tablet Take 0.5 mg by mouth every 8 (eight) hours as needed for anxiety.   Magnesium 300 MG CAPS Take 300 mg by mouth daily.   magnesium hydroxide (MILK OF MAGNESIA) 400 MG/5ML suspension Take 30 mLs by mouth daily as needed for mild constipation.   minoxidil (ROGAINE) 2 % external solution Apply topically daily  as needed.   pantoprazole (PROTONIX) 40 MG tablet Take 40 mg by mouth daily.   polyethylene glycol (MIRALAX / GLYCOLAX) 17 g packet Take 17 g by mouth 3 (three) times daily.   potassium chloride SA (KLOR-CON) 20 MEQ tablet Take 20 mEq by mouth daily.   Propylene Glycol (SYSTANE BALANCE OP) Apply 1 drop to eye every evening. related to PRESENCE OF INTRAOCULAR LENS (Z96.1) wait 10 minutes between Systane and timolol drops   rosuvastatin (CRESTOR) 10 MG tablet Take 10 mg by mouth at bedtime.   Sennosides (SENNA) 8.6 MG CAPS Take one to two capsules as needed at bedtime If no bowel movements in 3 days   simethicone (MYLICON)  80 MG chewable tablet Chew 1 tablet (80 mg total) by mouth 4 (four) times daily.   timolol (TIMOPTIC) 0.5 % ophthalmic solution Place 1 drop into the left eye 2 (two) times daily. related to PRESENCE OF INTRAOCULAR LENS (Z96.1) Wait 10 minutes between Systane and timolol drops   venlafaxine XR (EFFEXOR-XR) 75 MG 24 hr capsule TAKE 2 CAPSULES (150MG ) BY MOUTH DAILY.   vitamin B-12 (CYANOCOBALAMIN) 1000 MCG tablet Take 1,000 mcg by mouth daily.   No facility-administered encounter medications on file as of 09/02/2023.    Allergies (verified) Bactrim [sulfamethoxazole-trimethoprim], Ditropan [oxybutynin], and Sulfa antibiotics   History: Past Medical History:  Diagnosis Date   Arthritis    COVID    Depression    Hyperlipidemia    Osteopenia    Stroke (HCC)    Thrombocytopenia (HCC)    Vitamin D deficiency    Wrist fracture 09/10/2000   left   Past Surgical History:  Procedure Laterality Date   APPENDECTOMY  1967   CARPAL TUNNEL RELEASE  2008   right   COLON RESECTION SIGMOID N/A 12/03/2020   Procedure: EXPLORATORY LAPAROTOMY; HARTMAN'S PROCEDURE; SPLEENIC FLEXURE MOBILIZATION;  Surgeon: Romie Levee, MD;  Location: [facility];  Service: General;  Laterality: N/A;   COLOSTOMY N/A 12/03/2020   Procedure: COLOSTOMY;  Surgeon: Romie Levee, MD;  Location: [facility];  Service: General;  Laterality: N/A;   JOINT REPLACEMENT  2002   right total    Family History  Problem Relation Age of Onset   Breast cancer Mother    Breast cancer Sister    Other Son        unsure of cause   Arthritis Son    Ovarian cancer Daughter    Crohn's disease Daughter    Colon cancer Neg Hx    Esophageal cancer Neg Hx    Social History   Socioeconomic History   Marital status: Widowed    Spouse name: Not on file   Number of children: 4   Years of education: 16   Highest education level: Bachelor's degree (e.g., BA, AB, BS)  Occupational History   Not on file  Tobacco Use   Smoking status: Never    Smokeless tobacco: Never  Vaping Use   Vaping status: Never Used  Substance and Sexual Activity   Alcohol use: No   Drug use: No   Sexual activity: Not on file  Other Topics Concern   Not on file  Social History Narrative   Lives in senior apartment at [facility]   widowed   Social Drivers of Health   Financial Resource Strain: Low Risk  (10/16/2019)   Overall Financial Resource Strain (CARDIA)    Difficulty of Paying Living Expenses: Not hard at all  Food Insecurity: No Food Insecurity (10/16/2019)  Hunger Vital Sign    Worried About Running Out of Food in the Last Year: Never true    Ran Out of Food in the Last Year: Never true  Transportation Needs: No Transportation Needs (10/16/2019)   PRAPARE - Administrator, Civil Service (Medical): No    Lack of Transportation (Non-Medical): No  Physical Activity: Inactive (10/16/2019)   Exercise Vital Sign    Days of Exercise per Week: 0 days    Minutes of Exercise per Session: 0 min  Stress: No Stress Concern Present (10/16/2019)   [employer] of Occupational Health - Occupational Stress Questionnaire    Feeling of Stress : Only a little  Social Connections: Unknown (10/16/2019)   Social Connection and Isolation Panel [NHANES]    Frequency of Communication with Friends and Family: More than three times a week    Frequency of Social Gatherings with Friends and Family: Once a week    Attends Religious Services: Not on Marketing executive or Organizations: Yes    Attends Banker Meetings: Not on file    Marital Status: Widowed    Tobacco Counseling Counseling given: Not Answered   Clinical Intake:  Pre-visit preparation completed: Yes  Pain : 0-10 Pain Score: 5  Pain Type: Chronic pain Pain Location: Back Pain Orientation: Mid Pain Descriptors / Indicators: Aching Pain Onset: More than a month ago Pain Frequency: Intermittent Pain Relieving Factors: [place] Effect of Pain on Daily  Activities: less walking  Pain Relieving Factors: Norco  BMI - recorded: 30.82 Nutritional Status: BMI > 30  Obese Nutritional Risks: None Diabetes: No  How often do you need to have someone help you when you read instructions, pamphlets, or other written materials from your doctor or pharmacy?: 3 - Sometimes What is the last grade level you completed in school?: college  Interpreter Needed?: No  Information entered by :: Reece Fehnel Nedra Hai NP   Activities of Daily Living    09/25/2022   12:12 PM  In your present state of health, do you have any difficulty performing the following activities:  Hearing? 0  Vision? 0  Difficulty concentrating or making decisions? 0  Walking or climbing stairs? 1  Dressing or bathing? 1  Doing errands, shopping? 1  Preparing Food and eating ? N  Using the Toilet? N  In the past six months, have you accidently leaked urine? Y  Do you have problems with loss of bowel control? Y  Managing your Medications? Y  Managing your Finances? Y  Housekeeping or managing your Housekeeping? Y    Patient Care Team: Venita Sheffield, MD as PCP - General (Internal Medicine) Verner Chol, [facility] (Inactive) as Pharmacist (Pharmacist)  Indicate any recent Medical Services you may have received from other than [facility] providers in the past year (date may be approximate).     Assessment:   This is a routine wellness examination for Elize.  Hearing/Vision screen No results found.   Goals Addressed             This Visit's Progress    Maintain Mobility and Function       Evidence-based guidance:  Acknowledge and validate impact of pain, loss of strength and potential disfigurement (hand osteoarthritis) on mental health and daily life, such as social isolation, anxiety, depression, impaired sexual relationship and   injury from falls.  Anticipate referral to physical or occupational therapy for assessment, therapeutic exercise and recommendation for  adaptive equipment  or assistive devices; encourage participation.  Assess impact on ability to perform activities of daily living, as well as engage in sports and leisure events or requirements of work or school.  Provide anticipatory guidance and reassurance about the benefit of exercise to maintain function; acknowledge and normalize fear that exercise may worsen symptoms.  Encourage regular exercise, at least 10 minutes at a time for 45 minutes per week; consider yoga, water exercise and proprioceptive exercises; encourage use of wearable activity tracker to increase motivation and adherence.  Encourage maintenance or resumption of daily activities, including employment, as pain allows and with minimal exposure to trauma.  Assist patient to advocate for adaptations to the work environment.  Consider level of pain and function, gender, age, lifestyle, patient preference, quality of life, readiness and ?ocapacity to benefit? when recommending patients for orthopaedic surgery consultation.  Explore strategies, such as changes to medication regimen or activity that enables patient to anticipate and manage flare-ups that increase deconditioning and disability.  Explore patient preferences; encourage exposure to a broader range of activities that have been avoided for fear of experiencing pain.  Identify barriers to participation in therapy or exercise, such as pain with activity, anticipated or imagined pain.  Monitor postoperative joint replacement or any preexisting joint replacement for ongoing pain and loss of function; provide social support and encouragement throughout recovery.   Notes:        Depression Screen    09/02/2023    2:01 PM 10/11/2020   11:02 AM 08/31/2020    9:39 AM 10/16/2019   10:57 AM 07/18/2018    1:35 PM 06/17/2018    4:36 PM 06/13/2017    4:52 PM  PHQ 2/9 Scores  PHQ - 2 Score 0 3 0 2 0 1 0  PHQ- 9 Score  8  3 0      Fall Risk    10/02/2022    9:39 AM 08/31/2020     9:39 AM 10/16/2019   10:57 AM 08/04/2019    9:47 AM 07/31/2018    9:29 AM  Fall Risk   Falls in the past year? 0 1 0 0 0  Comment    [facility] Telephone Survey: data to providers prior to load [employer] Survey: data to providers prior to load  Number falls in past yr: 0 1     Injury with Fall? 0 1     Comment  September 2021 head injury minor     Risk for fall due to : No Fall Risks  Medication side effect    Follow up Falls evaluation completed  Falls evaluation completed;Education provided;Falls prevention discussed      MEDICARE RISK AT HOME: Medicare Risk at Home Any stairs in or around the home?: Yes If so, are there any without handrails?: No Home free of loose throw rugs in walkways, pet beds, electrical cords, etc?: Yes Adequate lighting in your home to reduce risk of falls?: Yes Life alert?: No Use of a cane, walker or w/c?: Yes Grab bars in the bathroom?: Yes Shower chair or bench in shower?: Yes Elevated toilet seat or a handicapped toilet?: Yes  TIMED UP AND GO:  Was the test performed?  Yes  Length of time to ambulate 10 feet: 10 sec Gait slow and steady with assistive device    Cognitive Function:    09/25/2022   12:13 PM 09/12/2021    4:25 PM 06/17/2018    4:41 PM 06/13/2017    4:56 PM 06/08/2016   12:03  PM  MMSE - Mini Mental State Exam  Not completed:   -- -- --  Orientation to time 5 4     Orientation to Place 5 5     Registration 3 3     Attention/ Calculation 4 5     Recall 2 3     Language- name 2 objects 2 2     Language- repeat 1 1     Language- follow 3 step command 1 3     Language- read & follow direction 1 1     Write a sentence 1 1     Copy design 1      Total score 26            Immunizations Immunization History  Administered Date(s) Administered   Fluad Quad(high Dose 65+) 06/13/2019, 07/02/2022   Influenza Split 06/18/2012   Influenza Whole 07/11/2010   Influenza, High Dose Seasonal PF 07/07/2013, 07/14/2014, 06/24/2015,  06/13/2016, 06/04/2017, 06/17/2018   Influenza-Unspecified 06/22/2020, 06/29/2021, 06/14/2023   Moderna Covid-19 Vaccine Bivalent Booster 21yrs & up 01/26/2022   Moderna SARS-COV2 Booster Vaccination 07/19/2020, 02/07/2021, 06/26/2023, 06/26/2023   Moderna Sars-Covid-2 Vaccination 09/14/2019, 10/12/2019   PFIZER(Purple Top)SARS-COV-2 Vaccination 05/30/2021   Pfizer Covid-19 Vaccine Bivalent Booster 34yrs & up 07/12/2022   Pneumococcal Conjugate-13 04/27/2014, 08/22/2021   Pneumococcal Polysaccharide-23 10/15/2017   Td 09/10/2005   Zoster Recombinant(Shingrix) 08/22/2021, 10/25/2021, 02/28/2022   Zoster, Live 11/29/2006    TDAP status: Up to date  Flu Vaccine status: Up to date  Pneumococcal vaccine status: Up to date  Covid-19 vaccine status: Completed vaccines  Qualifies for Shingles Vaccine? Yes   Zostavax completed Yes   Shingrix Completed?: Yes  Screening Tests Health Maintenance  Topic Date Due   COVID-19 Vaccine (6 - 2024-25 season) 08/21/2023   Medicare Annual Wellness (AWV)  09/01/2024   Pneumonia Vaccine 34+ Years old  Completed   INFLUENZA VACCINE  Completed   DEXA SCAN  Completed   Zoster Vaccines- Shingrix  Completed   HPV VACCINES  Aged Out   DTaP/Tdap/Td  Discontinued    Health Maintenance  Health Maintenance Due  Topic Date Due   COVID-19 Vaccine (6 - 2024-25 season) 08/21/2023    Colorectal cancer screening: No longer required.   Mammogram status: No longer required due to aged out.  Bone Density status: Completed 09/07/21. Results reflect: Bone density results: OSTEOPENIA. Repeat every 2 years.  Lung Cancer Screening: (Low Dose CT Chest recommended if Age 12-80 years, 20 pack-year currently smoking OR have quit w/in 15years.) does not qualify.   Lung Cancer Screening Referral: NA  Additional Screening:  Hepatitis C Screening: does not qualify;   Vision Screening: Recommended annual ophthalmology exams for early detection of glaucoma and  other disorders of the eye. Is the patient up to date with their annual eye exam?  Yes  Who is the provider or what is the name of the office in which the patient attends annual eye exams? 08/22/23 [place] If pt is not established with a provider, would they like to be referred to a provider to establish care? No .   Dental Screening: Recommended annual dental exams for proper oral hygiene  Diabetic Foot Exam: NA  Community Resource Referral / Chronic Care Management: CRR required this visit?  No   CCM required this visit?  No     Plan:     I have personally reviewed and noted the following in the patient's chart:   Medical and social history Use  of alcohol, tobacco or illicit drugs  Current medications and supplements including opioid prescriptions. Patient is currently taking opioid prescriptions. Information provided to patient regarding non-opioid alternatives. Patient advised to discuss non-opioid treatment plan with their provider. Functional ability and status Nutritional status Physical activity Advanced directives List of other physicians Hospitalizations, surgeries, and ER visits in previous 12 months Vitals Screenings to include cognitive, depression, and falls Referrals and appointments  In addition, I have reviewed and discussed with patient certain preventive protocols, quality metrics, and best practice recommendations. A written personalized care plan for preventive services as well as general preventive health recommendations were provided to patient.     Rella Egelston X Kemon Devincenzi, NP   09/09/2023   After Visit Summary: (In Person-Declined) Patient declined AVS at this time.

## 2023-09-05 DIAGNOSIS — L24B3 Irritant contact dermatitis related to fecal or urinary stoma or fistula: Secondary | ICD-10-CM | POA: Insufficient documentation

## 2023-09-05 DIAGNOSIS — R41841 Cognitive communication deficit: Secondary | ICD-10-CM | POA: Diagnosis not present

## 2023-09-05 DIAGNOSIS — Z433 Encounter for attention to colostomy: Secondary | ICD-10-CM | POA: Insufficient documentation

## 2023-09-05 DIAGNOSIS — K5904 Chronic idiopathic constipation: Secondary | ICD-10-CM | POA: Insufficient documentation

## 2023-09-10 DIAGNOSIS — R634 Abnormal weight loss: Secondary | ICD-10-CM | POA: Diagnosis not present

## 2023-09-10 LAB — BASIC METABOLIC PANEL
BUN: 20 (ref 4–21)
Chloride: 101 (ref 99–108)
Creatinine: 0.8 (ref 0.5–1.1)
Glucose: 103
Potassium: 3.5 meq/L (ref 3.5–5.1)
Sodium: 139 (ref 137–147)

## 2023-09-10 LAB — COMPREHENSIVE METABOLIC PANEL
Calcium: 9.7 (ref 8.7–10.7)
eGFR: 67

## 2023-09-17 ENCOUNTER — Other Ambulatory Visit: Payer: Self-pay | Admitting: Sports Medicine

## 2023-09-17 DIAGNOSIS — Z1231 Encounter for screening mammogram for malignant neoplasm of breast: Secondary | ICD-10-CM

## 2023-09-23 ENCOUNTER — Ambulatory Visit (HOSPITAL_COMMUNITY)
Admission: RE | Admit: 2023-09-23 | Discharge: 2023-09-23 | Disposition: A | Discharge status: Home / Self Care | Payer: Medicare Other | Source: Ambulatory Visit | Attending: *Deleted | Admitting: *Deleted

## 2023-09-23 NOTE — Progress Notes (Signed)
 [facility]   Reason for visit:  LLQ colostomy, new pouch working well, but having a hard time removing new pouch.  HPI:  colostomy Past Medical History:  Diagnosis Date   Arthritis    COVID    Depression    Hyperlipidemia    Osteopenia    Stroke (HCC)    Thrombocytopenia (HCC)    Vitamin D  deficiency    Wrist fracture 09/10/2000   left   Family History  Problem Relation Age of Onset   Breast cancer Mother    Breast cancer Sister    Other Son        unsure of cause   Arthritis Son    Ovarian cancer Daughter    Crohn's disease Daughter    Colon cancer Neg Hx    Esophageal cancer Neg Hx    Allergies  Allergen Reactions   Bactrim  [Sulfamethoxazole -Trimethoprim ] Nausea Only   Ditropan  [Oxybutynin ]     Made patient feel bad   Sulfa  Antibiotics     Other reaction(s): stomach upset   Current Outpatient Medications  Medication Sig Dispense Refill Last Dose/Taking   acetaminophen  (TYLENOL ) 325 MG tablet Take 650 mg by mouth 3 (three) times daily. SCHEDULED, See as needed dosing also      Alum & Mag Hydroxide-Simeth (ANTACID & ANTIGAS PO) Take 30 mLs by mouth every 4 (four) hours as needed. for Indigestion      amLODipine  (NORVASC ) 5 MG tablet Take 5 mg by mouth daily.      aspirin  EC 81 MG tablet Take 81 mg by mouth daily. Swallow whole.      calcium  carbonate (OSCAL) 1500 (600 Ca) MG TABS tablet Take 600 mg of elemental calcium  by mouth daily.      Cholecalciferol  (VITAMIN D3) 2000 units capsule Take 2,000 Units by mouth daily.      diclofenac Sodium (VOLTAREN ARTHRITIS PAIN) 1 % GEL Apply 2 g topically 3 (three) times daily as needed (Left wrist).      fluticasone (FLONASE) 50 MCG/ACT nasal spray Place 1 spray into both nostrils 2 (two) times daily.      loperamide (IMODIUM A-D) 2 MG tablet Take 2 mg by mouth as needed for diarrhea or loose stools.      LORazepam  (ATIVAN ) 0.5 MG tablet Take 0.5 mg by mouth every 8 (eight) hours as needed for anxiety.       Magnesium  300 MG CAPS Take 300 mg by mouth daily.      magnesium  hydroxide (MILK OF MAGNESIA) 400 MG/5ML suspension Take 30 mLs by mouth daily as needed for mild constipation.      minoxidil (ROGAINE) 2 % external solution Apply topically daily as needed.      pantoprazole  (PROTONIX ) 40 MG tablet Take 40 mg by mouth daily.      polyethylene glycol (MIRALAX  / GLYCOLAX ) 17 g packet Take 17 g by mouth 3 (three) times daily.      potassium chloride  SA (KLOR-CON ) 20 MEQ tablet Take 20 mEq by mouth daily.      Propylene Glycol (SYSTANE BALANCE OP) Apply 1 drop to eye every evening. related to PRESENCE OF INTRAOCULAR LENS (Z96.1) wait 10 minutes between Systane and timolol drops      rosuvastatin  (CRESTOR ) 10 MG tablet Take 10 mg by mouth at bedtime.      Sennosides (SENNA) 8.6 MG CAPS Take one to two capsules as needed at bedtime If no bowel movements in 3 days      simethicone  (  MYLICON) 80 MG chewable tablet Chew 1 tablet (80 mg total) by mouth 4 (four) times daily.      timolol (TIMOPTIC) 0.5 % ophthalmic solution Place 1 drop into the left eye 2 (two) times daily. related to PRESENCE OF INTRAOCULAR LENS (Z96.1) Wait 10 minutes between Systane and timolol drops      venlafaxine  XR (EFFEXOR -XR) 75 MG 24 hr capsule TAKE 2 CAPSULES (150MG ) BY MOUTH DAILY. 60 capsule 5    vitamin B-12 (CYANOCOBALAMIN) 1000 MCG tablet Take 1,000 mcg by mouth daily.      No current facility-administered medications for this encounter.   ROS  Review of Systems  Gastrointestinal:        LLQ colostomy  Musculoskeletal:        Dexterity in hands makes removal of pouch more difficult. Is using presized pouches to avoid cutting, will add adhesive remover  Psychiatric/Behavioral: Negative.    All other systems reviewed and are negative.  Vital signs:  BP (!) 160/71 (BP Location: Left Arm)   Pulse 86   Temp (!) 97.5 F (36.4 C) (Oral)   Resp 18   SpO2 96%  Exam:  Physical Exam Vitals reviewed.  Constitutional:       Appearance: Normal appearance.  Abdominal:     Palpations: Abdomen is soft.  Skin:    General: Skin is warm and dry.     Findings: Erythema (peristomal redness) present.  Neurological:     Mental Status: She is alert and oriented to person, place, and time.  Psychiatric:        Mood and Affect: Mood normal.        Behavior: Behavior normal.     Stoma type/location:  LLq colostomy with peristomal creasing Stomal assessment/size:  1 1/2 pink and moist Peristomal assessment:  intact  erythema with pouch removal, but resolves Treatment options for stomal/peristomal skin: Stoma powder and skin prep  barrier ring 1 piece convex pouch Output: soft brown stool Ostomy pouching: 1pc. convex Education provided:  New pouch has greatly improved wear time and skin condition.  Was set skin prep with alcohol .  I told her she needed no sting and will clarify order with [facility].  To aide in pouch removal, I demonstrate adhesive remover and she would like to begin this     Impression/dx  Colostomy Contact dermatitis Discussion  See back as needed.  Plan  Update [facility]    Visit time: 45 minutes.   Darice Cooley FNP-BC

## 2023-09-24 ENCOUNTER — Encounter: Payer: Self-pay | Admitting: Nurse Practitioner

## 2023-09-24 ENCOUNTER — Non-Acute Institutional Stay: Payer: Medicare Other | Admitting: Nurse Practitioner

## 2023-09-24 ENCOUNTER — Other Ambulatory Visit (HOSPITAL_COMMUNITY): Payer: Self-pay | Admitting: Nurse Practitioner

## 2023-09-24 DIAGNOSIS — I878 Other specified disorders of veins: Secondary | ICD-10-CM

## 2023-09-24 DIAGNOSIS — F339 Major depressive disorder, recurrent, unspecified: Secondary | ICD-10-CM | POA: Diagnosis not present

## 2023-09-24 DIAGNOSIS — I10 Essential (primary) hypertension: Secondary | ICD-10-CM | POA: Diagnosis not present

## 2023-09-24 DIAGNOSIS — Z933 Colostomy status: Secondary | ICD-10-CM | POA: Diagnosis not present

## 2023-09-24 NOTE — Assessment & Plan Note (Signed)
 Blood pressure is controlled,  takes Amlodipine Bun/creat 20/0.84 09/10/23

## 2023-09-24 NOTE — Assessment & Plan Note (Signed)
 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

## 2023-09-24 NOTE — Progress Notes (Signed)
 Location:   [facility] Room Number: 913 Place of Service:  ALF (13) Provider: Larwance Missey Hasley NP  Sherlynn Madden, MD  Patient Care Team: Sherlynn Madden, MD as PCP - General (Internal Medicine) Liane Sharyne MATSU, [facility] (Inactive) as Pharmacist (Pharmacist)  Extended Emergency Contact Information Primary Emergency Contact: Guilbault,Nan  [place]: [phone] Relation: Daughter  Code Status:  DNR Goals of care: Advanced Directive information    07/12/2023    3:37 PM  Advanced Directives  Does Patient Have a Medical Advance Directive? Yes  Type of Estate Agent of [place];Living will  Does patient want to make changes to medical advance directive? No - Patient declined  Copy of Healthcare Power of Attorney in Chart? Yes - validated most recent copy scanned in chart (See row information)     Chief Complaint  Patient presents with   Medical Management of Chronic Issues    HPI:  Pt is a 89 y.o. female seen today for medical management of chronic diseases.    BLE swelling, on Furosemide 10mg  every day since 09/02/23, improved RLE, more in the LLE, stated LLE discomfort/pain most at night when lying down, denied pain in calf when palpated or with the left foot dorsiflexion. No open wound or worsened redness than chronic venous skin changes. Bun/creat 20/0.84 09/10/23   Fell 05/09/23 when the patient tripped over the entrance of dinning room, mid to lower back pain is better controlled, X-ray 05/16/23 lumbar spine, pelvis, hips showed no acute abnormality. Tylenol  is effective now.              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, s/p Hartman's procedure, splenic flexure mobilization              HTN takes Amlodipine  Bun/creat 20/0.84 09/10/23             Hypomagnesemia, Mg 1.8 12/20/20             Vit B12 deficiency, takes Vit B,  Vit B12 942             Hx of CVA w/o residual deficit             Hyperlipidemia, takes Rosuvastatin , LDL 72 12/20/20             Depression, takes Venlafaxine , MMSE 30/30 09/03/23             Constipation, takes MiraLax  bid             Post op anemia, off Fe Hgb 12.9 05/30/23             Hypokalemia, supplemented, takes Kcl, serum K 3.5 09/10/23             Allergic rhinitis, takes Flonase              OA, mid to lower back, takes Tylenol .                    Past Medical History:  Diagnosis Date   Arthritis    COVID    Depression    Hyperlipidemia    Osteopenia    Stroke (HCC)    Thrombocytopenia (HCC)    Vitamin D  deficiency    Wrist fracture 09/10/2000   left   Past Surgical History:  Procedure Laterality Date   APPENDECTOMY  1967   CARPAL TUNNEL RELEASE  2008   right   COLON RESECTION SIGMOID N/A 12/03/2020   Procedure: EXPLORATORY LAPAROTOMY; HARTMAN'S PROCEDURE; SPLEENIC FLEXURE MOBILIZATION;  Surgeon: Debby Hila, MD;  Location: [facility];  Service: General;  Laterality: N/A;   COLOSTOMY N/A 12/03/2020   Procedure: COLOSTOMY;  Surgeon: Debby Hila, MD;  Location: [facility];  Service: General;  Laterality: N/A;   JOINT REPLACEMENT  2002   right total     Allergies  Allergen Reactions   Bactrim  [Sulfamethoxazole -Trimethoprim ] Nausea Only   Ditropan  [Oxybutynin ]     Made patient feel bad   Sulfa  Antibiotics     Other reaction(s): stomach upset    Allergies as of 09/24/2023       Reactions   Bactrim  [sulfamethoxazole -trimethoprim ] Nausea Only   Ditropan  [oxybutynin ]    Made patient feel bad   Sulfa  Antibiotics    Other reaction(s): stomach upset        Medication List        Accurate as of September 24, 2023  1:02 PM. If you have any questions, ask your nurse or doctor.          acetaminophen  325 MG tablet Commonly known as: TYLENOL  Take 650 mg by mouth 3 (three) times daily. SCHEDULED, See as needed dosing also   amLODipine  5 MG tablet Commonly  known as: NORVASC  Take 5 mg by mouth daily.   ANTACID & ANTIGAS PO Take 30 mLs by mouth every 4 (four) hours as needed. for Indigestion   aspirin  EC 81 MG tablet Take 81 mg by mouth daily. Swallow whole.   calcium  carbonate 1500 (600 Ca) MG Tabs tablet Commonly known as: OSCAL Take 600 mg of elemental calcium  by mouth daily.   cyanocobalamin 1000 MCG tablet Commonly known as: VITAMIN B12 Take 1,000 mcg by mouth daily.   fluticasone 50 MCG/ACT nasal spray Commonly known as: FLONASE Place 1 spray into both nostrils 2 (two) times daily.   loperamide 2 MG tablet Commonly known as: IMODIUM A-D Take 2 mg by mouth as needed for diarrhea or loose stools.   LORazepam  0.5 MG tablet Commonly known as: ATIVAN  Take 0.5 mg by mouth every 8 (eight) hours as needed for anxiety.   Magnesium  300 MG Caps Take 300 mg by mouth daily.   magnesium  hydroxide 400 MG/5ML suspension Commonly known as: MILK OF MAGNESIA Take 30 mLs by mouth daily as needed for mild constipation.   minoxidil 2 % external solution Commonly known as: ROGAINE Apply topically daily as needed.   pantoprazole  40 MG tablet Commonly known as: PROTONIX  Take 40 mg by mouth daily.   polyethylene glycol 17 g packet Commonly known as: MIRALAX  / GLYCOLAX  Take 17 g by mouth 3 (three) times daily.   potassium chloride  SA 20 MEQ tablet Commonly known as: KLOR-CON  M Take 20 mEq by mouth daily.   rosuvastatin  10 MG tablet Commonly known as: CRESTOR  Take 10 mg by mouth at bedtime.   Senna 8.6 MG Caps Take one to two capsules as needed at bedtime If no bowel movements in 3 days   simethicone  80 MG chewable tablet Commonly known as: MYLICON Chew 1 tablet (80 mg total) by mouth 4 (four) times daily.   SYSTANE BALANCE OP Apply 1 drop to eye every evening. related to PRESENCE OF INTRAOCULAR LENS (Z96.1) wait 10 minutes between Systane and timolol drops   timolol 0.5 % ophthalmic solution Commonly known as:  TIMOPTIC Place 1 drop into the left eye 2 (  two) times daily. related to PRESENCE OF INTRAOCULAR LENS (Z96.1) Wait 10 minutes between Systane and timolol drops   venlafaxine  XR 75 MG 24 hr capsule Commonly known as: EFFEXOR -XR TAKE 2 CAPSULES (150MG ) BY MOUTH DAILY.   Vitamin D3 50 MCG (2000 UT) capsule Take 2,000 Units by mouth daily.   Voltaren Arthritis Pain 1 % Gel Generic drug: diclofenac Sodium Apply 2 g topically 3 (three) times daily as needed (Left wrist).        Review of Systems  Constitutional:  Negative for appetite change, fatigue and fever.  HENT:  Negative for congestion, hearing loss, rhinorrhea and trouble swallowing.   Eyes:  Negative for visual disturbance.  Respiratory:  Positive for shortness of breath. Negative for cough and wheezing.   Cardiovascular:  Positive for leg swelling.  Gastrointestinal:  Negative for abdominal pain and constipation.  Genitourinary:  Positive for frequency. Negative for dysuria and urgency.  Musculoskeletal:  Positive for arthralgias, back pain and gait problem.       Left knee pain is chronic. Lower back pain mid to lower noted since fall 05/09/23-minimal  Skin:  Negative for color change.  Neurological:  Negative for speech difficulty and weakness.  Psychiatric/Behavioral:  Negative for confusion and sleep disturbance. The patient is not nervous/anxious.     Immunization History  Administered Date(s) Administered   Fluad Quad(high Dose 65+) 06/13/2019, 07/02/2022   Influenza Split 06/18/2012   Influenza Whole 07/11/2010   Influenza, High Dose Seasonal PF 07/07/2013, 07/14/2014, 06/24/2015, 06/13/2016, 06/04/2017, 06/17/2018   Influenza-Unspecified 06/22/2020, 06/29/2021, 06/14/2023   Moderna Covid-19 Vaccine  Bivalent Booster 59yrs & up 01/26/2022   Moderna SARS-COV2 Booster Vaccination 07/19/2020, 02/07/2021, 06/26/2023, 06/26/2023   Moderna Sars-Covid-2 Vaccination 09/14/2019, 10/12/2019   PFIZER(Purple Top)SARS-COV-2  Vaccination 05/30/2021   Pfizer Covid-19 Vaccine Bivalent Booster 6yrs & up 07/12/2022   Pneumococcal Conjugate-13 04/27/2014, 08/22/2021   Pneumococcal Polysaccharide-23 10/15/2017   Td 09/10/2005   Zoster Recombinant(Shingrix) 08/22/2021, 10/25/2021, 02/28/2022   Zoster, Live 11/29/2006   Pertinent  Health Maintenance Due  Topic Date Due   INFLUENZA VACCINE  Completed   DEXA SCAN  Completed      12/14/2020    5:00 AM 12/14/2020    8:00 AM 12/14/2020    8:29 PM 12/15/2020    8:44 AM 10/02/2022    9:39 AM  Fall Risk  Falls in the past year?     0  Was there an injury with Fall?     0  Fall Risk Category Calculator     0  (RETIRED) Patient Fall Risk Level Moderate fall risk Moderate fall risk Moderate fall risk High fall risk   Patient at Risk for Falls Due to     No Fall Risks  Fall risk Follow up     Falls evaluation completed   Functional Status Survey:    Vitals:   09/24/23 1239  BP: 115/68  Pulse: 68  Resp: 18  Temp: (!) 97.5 F (36.4 C)  SpO2: 96%  Weight: 168 lb 12.8 oz (76.6 kg)   Body mass index is 29.9 kg/m. Physical Exam Vitals and nursing note reviewed.  Constitutional:      Appearance: Normal appearance.  HENT:     Head: Normocephalic and atraumatic.     Nose: Nose normal.     Mouth/Throat:     Mouth: Mucous membranes are moist.  Eyes:     Extraocular Movements: Extraocular movements intact.     Conjunctiva/sclera: Conjunctivae normal.     Pupils: Pupils  are equal, round, and reactive to light.  Cardiovascular:     Rate and Rhythm: Normal rate and regular rhythm.     Heart sounds: No murmur heard. Pulmonary:     Effort: Pulmonary effort is normal.     Breath sounds: No rales.  Abdominal:     General: Bowel sounds are normal.     Palpations: Abdomen is soft.     Tenderness: There is no abdominal tenderness.     Comments: Colostomy  Musculoskeletal:        General: No tenderness.     Cervical back: Normal range of motion and neck supple.      Right lower leg: Edema present.     Left lower leg: Edema present.     Comments: mid to lower back pain is better, only positional.  LLE edema 2+, RLE trace   Skin:    General: Skin is warm and dry.     Comments: Mid abd surgical scar. Colostomy is functioning.  Chronic venous insufficiency skin changes BLE, slightly warmer LLE>RLE, no tenderness in calves palpated or with dorsiflexion of the R and L foot.   Neurological:     General: No focal deficit present.     Mental Status: She is alert and oriented to person, place, and time. Mental status is at baseline.     Gait: Gait abnormal.  Psychiatric:        Mood and Affect: Mood normal.        Behavior: Behavior normal.        Thought Content: Thought content normal.        Judgment: Judgment normal.     Labs reviewed: Recent Labs    05/30/23 0000 06/21/23 0000  NA 138 134*  K 3.8 3.9  CL 103 99  CO2 25* 26*  BUN 10 15  CREATININE 0.6 0.7  CALCIUM  9.4 9.2   Recent Labs    05/30/23 0000 06/25/23 0000  AST 16 17  ALT 13 15  ALKPHOS 72 71  ALBUMIN  3.9 3.7   Recent Labs    05/30/23 0000 06/21/23 0000  WBC 5.4 6.0  NEUTROABS 3,434.00 3,630.00  HGB 12.9 13.7  HCT 39 43  PLT 125* 135*   Lab Results  Component Value Date   TSH 1.43 05/30/2023   Lab Results  Component Value Date   HGBA1C 5.9 (H) 09/26/2015   Lab Results  Component Value Date   CHOL 128 05/30/2023   HDL 51 05/30/2023   LDLCALC 58 05/30/2023   LDLDIRECT 147.0 11/17/2012   TRIG 106 05/30/2023   CHOLHDL 3 02/10/2020    Significant Diagnostic Results in last 30 days:  No results found.  Assessment/Plan  Venous stasis BLE swelling, on Furosemide 10mg  every day, improved RLE, more in the LLE, stated LLE discomfort/pain most at night when lying down, denied pain in calf when palpated or with the left foot dorsiflexion. No open wound or worsened redness than chronic venous skin changes.  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   Labs/tests ordered:  venous US  LLE, ABI R+L  Time spend 40 minutes.

## 2023-09-24 NOTE — Discharge Instructions (Signed)
 Needs order ([facility] ) for NO STING skin prep and adhesive remover

## 2023-09-24 NOTE — Assessment & Plan Note (Signed)
 BLE swelling, on Furosemide 10mg  every day, improved RLE, more in the LLE, stated LLE discomfort/pain most at night when lying down, denied pain in calf when palpated or with the left foot dorsiflexion. No open wound or worsened redness than chronic venous skin changes.  ABI R+L Venous US  LLE

## 2023-09-24 NOTE — Assessment & Plan Note (Signed)
 takes Venlafaxine, MMSE 30/30 09/03/23

## 2023-09-27 ENCOUNTER — Other Ambulatory Visit (HOSPITAL_COMMUNITY): Payer: Self-pay | Admitting: Nurse Practitioner

## 2023-09-27 DIAGNOSIS — Z433 Encounter for attention to colostomy: Secondary | ICD-10-CM

## 2023-09-27 DIAGNOSIS — L24B3 Irritant contact dermatitis related to fecal or urinary stoma or fistula: Secondary | ICD-10-CM

## 2023-10-01 ENCOUNTER — Non-Acute Institutional Stay: Payer: Medicare Other | Admitting: Nurse Practitioner

## 2023-10-01 DIAGNOSIS — F339 Major depressive disorder, recurrent, unspecified: Secondary | ICD-10-CM

## 2023-10-01 DIAGNOSIS — I872 Venous insufficiency (chronic) (peripheral): Secondary | ICD-10-CM | POA: Diagnosis not present

## 2023-10-01 DIAGNOSIS — Z933 Colostomy status: Secondary | ICD-10-CM

## 2023-10-01 DIAGNOSIS — I10 Essential (primary) hypertension: Secondary | ICD-10-CM | POA: Diagnosis not present

## 2023-10-01 NOTE — Assessment & Plan Note (Signed)
Blood pressure is controlled,  takes Amlodipine Bun/creat 20/0.84 09/10/23

## 2023-10-01 NOTE — Assessment & Plan Note (Signed)
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

## 2023-10-01 NOTE — Assessment & Plan Note (Addendum)
persisted LLE swelling, noted more warmth, no pain palpated or with foot dorsiflexion. Venous US negative for DVT(09/26/23 venous US LLE, backer's cyst, negative DVT noted backer's cyst). 09/30/23 ABT L 1.1, R not obtained, possible DVT in popliteal vein. The patient declined ED further evaluation and treatment, agreed to repeat venous US in [facility].    BLE swelling, on Furosemide 10mg  every day since 09/02/23, improved RLE, more in the LLE, stated LLE discomfort/pain most at night when lying down, denied pain in calf when palpated or with the left foot dorsiflexion. No open wound or worsened redness than chronic venous skin changes. Bun/creat 20/0.84 09/10/23   Repeat venous US BLE, empirical treat with Doxycycline 100mg  bid x 7days for ? Cellulitis/venous dermatitis.   Increase Furosemide to 20mg  every day, adding Kcl every day.   Update CBC/diff, CMP/eGFR  Will start Eliquis   10/01/23 wbc 5.6, Hgb 12.3, plt 112, neutrophils 67.8%, Na 137, K 3.6, Bun 19, creat 0.80

## 2023-10-01 NOTE — Progress Notes (Unsigned)
Location:   [facility] Room Number: 913 Place of Service:  ALF (13) Provider: Arna Snipe Christop Hippert NP  Venita Sheffield, MD  Patient Care Team: Venita Sheffield, MD as PCP - General (Internal Medicine) Verner Chol, [facility] (Inactive) as Pharmacist (Pharmacist)  Extended Emergency Contact Information Primary Emergency Contact: Julio Sicks [place] Phone: [phone] Relation: Daughter  Code Status: DNR Goals of care: Advanced Directive information    07/12/2023    3:37 PM  Advanced Directives  Does Patient Have a Medical Advance Directive? Yes  Type of Estate agent of [place];Living will  Does patient want to make changes to medical advance directive? No - Patient declined  Copy of Healthcare Power of Attorney in Chart? Yes - validated most recent copy scanned in chart (See row information)     Chief Complaint  Patient presents with   Acute Visit    ? DVT in RLE per ABI, persisted LLE swelling, more warmth noted.     HPI:  Pt is a 89 y.o. female seen today for an acute visit for persisted LLE swelling, noted more warmth, no pain palpated or with foot dorsiflexion. Venous US negative for DVT, noted backer's cyst. 09/30/23 ABT L 1.1, R not obtained, possible DVT in popliteal vein. The patient declined ED further evaluation and treatment, agreed to repeat venous US in [facility].    BLE swelling, on Furosemide 10mg  every day since 09/02/23, improved RLE, more in the LLE, stated LLE discomfort/pain most at night when lying down, denied pain in calf when palpated or with the left foot dorsiflexion. No open wound or worsened redness than chronic venous skin changes. Bun/creat 20/0.84 09/10/23              Fell 05/09/23 when the patient tripped over the entrance of dinning room, mid to lower back pain is better controlled, X-ray 05/16/23 lumbar spine, pelvis, hips showed no acute abnormality. Tylenol is effective now.               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, s/p Hartman's procedure, splenic flexure mobilization              HTN takes Amlodipine Bun/creat 20/0.84 09/10/23             Hypomagnesemia, Mg 1.8 12/20/20             Vit B12 deficiency, takes Vit B, Vit B12 942             Hx of CVA w/o residual deficit             Hyperlipidemia, takes Rosuvastatin, LDL 72 12/20/20             Depression, takes Venlafaxine, MMSE 30/30 09/03/23             Constipation, takes MiraLax bid             Post op anemia, off Fe Hgb 12.9 05/30/23             Hypokalemia, supplemented, takes Kcl, serum K 3.5 09/10/23             Allergic rhinitis, takes Flonase              OA, mid to lower back, takes Tylenol.  Past Medical History:  Diagnosis Date   Arthritis    COVID    Depression    Hyperlipidemia    Osteopenia    Stroke (HCC)    Thrombocytopenia (HCC)    Vitamin D deficiency    Wrist fracture 09/10/2000   left   Past Surgical History:  Procedure Laterality Date   APPENDECTOMY  1967   CARPAL TUNNEL RELEASE  2008   right   COLON RESECTION SIGMOID N/A 12/03/2020   Procedure: EXPLORATORY LAPAROTOMY; HARTMAN'S PROCEDURE; SPLEENIC FLEXURE MOBILIZATION;  Surgeon: Romie Levee, MD;  Location: [facility];  Service: General;  Laterality: N/A;   COLOSTOMY N/A 12/03/2020   Procedure: COLOSTOMY;  Surgeon: Romie Levee, MD;  Location: [facility];  Service: General;  Laterality: N/A;   JOINT REPLACEMENT  2002   right total     Allergies  Allergen Reactions   Bactrim [Sulfamethoxazole-Trimethoprim] Nausea Only   Ditropan [Oxybutynin]     Made patient feel bad   Sulfa Antibiotics     Other reaction(s): stomach upset    Allergies as of 10/01/2023       Reactions   Bactrim [sulfamethoxazole-trimethoprim] Nausea Only   Ditropan [oxybutynin]    Made patient feel bad   Sulfa Antibiotics    Other reaction(s):  stomach upset        Medication List        Accurate as of October 01, 2023 11:59 PM. If you have any questions, ask your nurse or doctor.          acetaminophen 325 MG tablet Commonly known as: TYLENOL Take 650 mg by mouth 3 (three) times daily. SCHEDULED, See as needed dosing also   amLODipine 5 MG tablet Commonly known as: NORVASC Take 5 mg by mouth daily.   ANTACID & ANTIGAS PO Take 30 mLs by mouth every 4 (four) hours as needed. for Indigestion   aspirin EC 81 MG tablet Take 81 mg by mouth daily. Swallow whole.   calcium carbonate 1500 (600 Ca) MG Tabs tablet Commonly known as: OSCAL Take 600 mg of elemental calcium by mouth daily.   cyanocobalamin 1000 MCG tablet Commonly known as: VITAMIN B12 Take 1,000 mcg by mouth daily.   fluticasone 50 MCG/ACT nasal spray Commonly known as: FLONASE Place 1 spray into both nostrils 2 (two) times daily.   loperamide 2 MG tablet Commonly known as: IMODIUM A-D Take 2 mg by mouth as needed for diarrhea or loose stools.   LORazepam 0.5 MG tablet Commonly known as: ATIVAN Take 0.5 mg by mouth every 8 (eight) hours as needed for anxiety.   Magnesium 300 MG Caps Take 300 mg by mouth daily.   magnesium hydroxide 400 MG/5ML suspension Commonly known as: MILK OF MAGNESIA Take 30 mLs by mouth daily as needed for mild constipation.   minoxidil 2 % external solution Commonly known as: ROGAINE Apply topically daily as needed.   pantoprazole 40 MG tablet Commonly known as: PROTONIX Take 40 mg by mouth daily.   polyethylene glycol 17 g packet Commonly known as: MIRALAX / GLYCOLAX Take 17 g by mouth 3 (three) times daily.   potassium chloride SA 20 MEQ tablet Commonly known as: KLOR-CON M Take 20 mEq by mouth daily.   rosuvastatin 10 MG tablet Commonly known as: CRESTOR Take 10 mg by mouth at bedtime.   Senna 8.6 MG Caps Take one to two capsules as needed at bedtime If no bowel movements in 3 days   simethicone  80 MG chewable tablet  Commonly known as: MYLICON Chew 1 tablet (80 mg total) by mouth 4 (four) times daily.   SYSTANE BALANCE OP Apply 1 drop to eye every evening. related to PRESENCE OF INTRAOCULAR LENS (Z96.1) wait 10 minutes between Systane and timolol drops   timolol 0.5 % ophthalmic solution Commonly known as: TIMOPTIC Place 1 drop into the left eye 2 (two) times daily. related to PRESENCE OF INTRAOCULAR LENS (Z96.1) Wait 10 minutes between Systane and timolol drops   venlafaxine XR 75 MG 24 hr capsule Commonly known as: EFFEXOR-XR TAKE 2 CAPSULES (150MG ) BY MOUTH DAILY.   Vitamin D3 50 MCG (2000 UT) capsule Take 2,000 Units by mouth daily.   Voltaren Arthritis Pain 1 % Gel Generic drug: diclofenac Sodium Apply 2 g topically 3 (three) times daily as needed (Left wrist).        Review of Systems  Constitutional:  Negative for appetite change, fatigue and fever.  HENT:  Negative for congestion, hearing loss, rhinorrhea and trouble swallowing.   Eyes:  Negative for visual disturbance.  Respiratory:  Positive for shortness of breath. Negative for cough and wheezing.   Cardiovascular:  Positive for leg swelling.  Gastrointestinal:  Negative for abdominal pain and constipation.  Genitourinary:  Positive for frequency. Negative for dysuria and urgency.  Musculoskeletal:  Positive for arthralgias, back pain and gait problem.       Left knee pain is chronic. Lower back pain mid to lower noted since fall 05/09/23-minimal  Skin:  Negative for color change.  Neurological:  Negative for speech difficulty and weakness.  Psychiatric/Behavioral:  Negative for confusion and sleep disturbance. The patient is not nervous/anxious.     Immunization History  Administered Date(s) Administered   Fluad Quad(high Dose 65+) 06/13/2019, 07/02/2022   Influenza Split 06/18/2012   Influenza Whole 07/11/2010   Influenza, High Dose Seasonal PF 07/07/2013, 07/14/2014, 06/24/2015, 06/13/2016,  06/04/2017, 06/17/2018   Influenza-Unspecified 06/22/2020, 06/29/2021, 06/14/2023   Moderna Covid-19 Vaccine Bivalent Booster 86yrs & up 01/26/2022   Moderna SARS-COV2 Booster Vaccination 07/19/2020, 02/07/2021, 06/26/2023, 06/26/2023   Moderna Sars-Covid-2 Vaccination 09/14/2019, 10/12/2019   PFIZER(Purple Top)SARS-COV-2 Vaccination 05/30/2021   Pfizer Covid-19 Vaccine Bivalent Booster 73yrs & up 07/12/2022   Pneumococcal Conjugate-13 04/27/2014, 08/22/2021   Pneumococcal Polysaccharide-23 10/15/2017   Td 09/10/2005   Zoster Recombinant(Shingrix) 08/22/2021, 10/25/2021, 02/28/2022   Zoster, Live 11/29/2006   Pertinent  Health Maintenance Due  Topic Date Due   INFLUENZA VACCINE  Completed   DEXA SCAN  Completed      12/14/2020    5:00 AM 12/14/2020    8:00 AM 12/14/2020    8:29 PM 12/15/2020    8:44 AM 10/02/2022    9:39 AM  Fall Risk  Falls in the past year?     0  Was there an injury with Fall?     0  Fall Risk Category Calculator     0  (RETIRED) Patient Fall Risk Level Moderate fall risk Moderate fall risk Moderate fall risk High fall risk   Patient at Risk for Falls Due to     No Fall Risks  Fall risk Follow up     Falls evaluation completed   Functional Status Survey:    Vitals:   10/01/23 1027  BP: (!) 159/80  Pulse: 88  Resp: 19  Temp: 98.9 F (37.2 C)  SpO2: 92%  Weight: 168 lb 12.8 oz (76.6 kg)   Body mass index is 29.9 kg/m. Physical Exam Vitals and nursing note reviewed.  Constitutional:  Appearance: Normal appearance.  HENT:     Head: Normocephalic and atraumatic.     Nose: Nose normal.     Mouth/Throat:     Mouth: Mucous membranes are moist.  Eyes:     Extraocular Movements: Extraocular movements intact.     Conjunctiva/sclera: Conjunctivae normal.     Pupils: Pupils are equal, round, and reactive to light.  Cardiovascular:     Rate and Rhythm: Normal rate and regular rhythm.     Heart sounds: No murmur heard. Pulmonary:     Effort: Pulmonary  effort is normal.     Breath sounds: No rales.  Abdominal:     General: Bowel sounds are normal.     Palpations: Abdomen is soft.     Tenderness: There is no abdominal tenderness.     Comments: Colostomy  Musculoskeletal:        General: No tenderness.     Cervical back: Normal range of motion and neck supple.     Right lower leg: Edema present.     Left lower leg: Edema present.     Comments: mid to lower back pain is better, only positional.  LLE edema 2+, RLE trace   Skin:    General: Skin is warm and dry.     Findings: Erythema present.     Comments: Mid abd surgical scar. Colostomy is functioning.  Chronic venous insufficiency skin changes BLE, much warmer LLE>RLE, no tenderness in calves palpated or with dorsiflexion of the R and L foot.   Neurological:     General: No focal deficit present.     Mental Status: She is alert and oriented to person, place, and time. Mental status is at baseline.     Gait: Gait abnormal.  Psychiatric:        Mood and Affect: Mood normal.        Behavior: Behavior normal.        Thought Content: Thought content normal.        Judgment: Judgment normal.     Labs reviewed: Recent Labs    05/30/23 0000 06/21/23 0000  NA 138 134*  K 3.8 3.9  CL 103 99  CO2 25* 26*  BUN 10 15  CREATININE 0.6 0.7  CALCIUM 9.4 9.2   Recent Labs    05/30/23 0000 06/25/23 0000  AST 16 17  ALT 13 15  ALKPHOS 72 71  ALBUMIN 3.9 3.7   Recent Labs    05/30/23 0000 06/21/23 0000  WBC 5.4 6.0  NEUTROABS 3,434.00 3,630.00  HGB 12.9 13.7  HCT 39 43  PLT 125* 135*   Lab Results  Component Value Date   TSH 1.43 05/30/2023   Lab Results  Component Value Date   HGBA1C 5.9 (H) 09/26/2015   Lab Results  Component Value Date   CHOL 128 05/30/2023   HDL 51 05/30/2023   LDLCALC 58 05/30/2023   LDLDIRECT 147.0 11/17/2012   TRIG 106 05/30/2023   CHOLHDL 3 02/10/2020    Significant Diagnostic Results in last 30 days:  No results  found.  Assessment/Plan: Venous insufficiency of both lower extremities persisted LLE swelling, noted more warmth, no pain palpated or with foot dorsiflexion. Venous US negative for DVT(09/26/23 venous US LLE, backer's cyst, negative DVT noted backer's cyst). 09/30/23 ABT L 1.1, R not obtained, possible DVT in popliteal vein. The patient declined ED further evaluation and treatment, agreed to repeat venous US in [facility].    BLE swelling, on Furosemide 10mg  every  day since 09/02/23, improved RLE, more in the LLE, stated LLE discomfort/pain most at night when lying down, denied pain in calf when palpated or with the left foot dorsiflexion. No open wound or worsened redness than chronic venous skin changes. Bun/creat 20/0.84 09/10/23   Repeat venous US BLE, empirical treat with Doxycycline 100mg  bid x 7days for ? Cellulitis/venous dermatitis.   Increase Furosemide to 20mg  every day, adding Kcl every day.   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   Labs/tests ordered: repeat venous US BLE, CBC/diff, CMP/eGFR  Time spend 40 minutes.

## 2023-10-01 NOTE — Assessment & Plan Note (Signed)
takes Venlafaxine, MMSE 30/30 09/03/23

## 2023-10-03 ENCOUNTER — Encounter: Payer: Self-pay | Admitting: Nurse Practitioner

## 2023-10-04 ENCOUNTER — Encounter: Payer: Self-pay | Admitting: Nurse Practitioner

## 2023-10-04 ENCOUNTER — Non-Acute Institutional Stay: Payer: Self-pay | Admitting: Nurse Practitioner

## 2023-10-04 ENCOUNTER — Ambulatory Visit
Admission: RE | Admit: 2023-10-04 | Discharge: 2023-10-04 | Disposition: A | Discharge status: Home / Self Care | Payer: Medicare Other | Source: Ambulatory Visit | Attending: Sports Medicine

## 2023-10-04 DIAGNOSIS — Z933 Colostomy status: Secondary | ICD-10-CM | POA: Diagnosis not present

## 2023-10-04 DIAGNOSIS — I10 Essential (primary) hypertension: Secondary | ICD-10-CM

## 2023-10-04 DIAGNOSIS — E876 Hypokalemia: Secondary | ICD-10-CM

## 2023-10-04 DIAGNOSIS — I872 Venous insufficiency (chronic) (peripheral): Secondary | ICD-10-CM | POA: Diagnosis not present

## 2023-10-04 DIAGNOSIS — Z1231 Encounter for screening mammogram for malignant neoplasm of breast: Secondary | ICD-10-CM

## 2023-10-04 DIAGNOSIS — F339 Major depressive disorder, recurrent, unspecified: Secondary | ICD-10-CM

## 2023-10-04 DIAGNOSIS — E785 Hyperlipidemia, unspecified: Secondary | ICD-10-CM

## 2023-10-04 NOTE — Assessment & Plan Note (Signed)
Anxious related to recently increased edema in LLE, takes Venlafaxine, MMSE 30/30 09/03/23

## 2023-10-04 NOTE — Assessment & Plan Note (Signed)
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

## 2023-10-04 NOTE — Progress Notes (Unsigned)
Location:   [facility] Room Number: 913 Place of Service:  ALF (13) Provider: Arna Snipe Riyan Gavina NP  Venita Sheffield, MD  Sara Logan Care Team: Venita Sheffield, MD as PCP - General (Internal Medicine) Verner Chol, [facility] (Inactive) as Pharmacist (Pharmacist)  Extended Emergency Contact Information Primary Emergency Contact: Julio Sicks [place] Phone: [phone] Relation: Daughter  Code Status: DNR Goals of care: Advanced Directive information    07/12/2023    3:37 PM  Advanced Directives  Does Sara Logan Have a Medical Advance Directive? Yes  Type of Estate agent of [place];Living will  Does Sara Logan want to make changes to medical advance directive? No - Sara Logan declined  Copy of Healthcare Power of Attorney in Chart? Yes - validated most recent copy scanned in chart (See row information)     Chief Complaint  Sara Logan presents with  . Acute Visit    Persisted left lower leg edema    HPI:  Pt is a 89 y.o. female seen today for an acute visit for persisted left lower leg edema, mild warmth and redness, denied pain except mild discomfort, not change since treated with Doxy for ? Cellulitis, on Furosemide was increased to 20mg  every day 10/02/23  The Sara Logan will think about further diagnostic testing CT abd/pelvis w/wo CM to evaluate intraabdominal process  1/16/25Venous [place] negative for DVT, noted backer's cyst. 09/30/23 ABT L 1.1, R not obtained, possible DVT in popliteal vein, started Eliquis subsequently 10/03/23 repeated venous US no acute DVT, discontinued Eliquis                 Fell 05/09/23 when the Sara Logan tripped over the entrance of dinning room, mid to lower back pain is better controlled, X-ray 05/16/23 lumbar spine, pelvis, hips showed no acute abnormality. Tylenol is effective now.              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, s/p Hartman's procedure, splenic flexure mobilization              HTN takes Amlodipine Bun/creat 19/0.8 10/01/23             Hypomagnesemia, Mg 1.8 12/20/20             Vit B12 deficiency, takes Vit B, Vit B12 942             Hx of CVA w/o residual deficit             Hyperlipidemia, takes Rosuvastatin, LDL 72 12/20/20             Depression, takes Venlafaxine, MMSE 30/30 09/03/23             Constipation, takes MiraLax              Post op anemia, off Fe, Hgb 12.3 10/01/23             Hypokalemia, supplemented, takes Kcl, serum K 3.6 10/01/23             Allergic rhinitis, takes Flonase              OA, mid to lower back, takes Tylenol.                    Past Medical History:  Diagnosis Date  . Arthritis   . COVID   . Depression   .  Hyperlipidemia   . Osteopenia   . Stroke (HCC)   . Thrombocytopenia (HCC)   . Vitamin D deficiency   . Wrist fracture 09/10/2000   left   Past Surgical History:  Procedure Laterality Date  . APPENDECTOMY  1967  . CARPAL TUNNEL RELEASE  2008   right  . COLON RESECTION SIGMOID N/A 12/03/2020   Procedure: EXPLORATORY LAPAROTOMY; HARTMAN'S PROCEDURE; SPLEENIC FLEXURE MOBILIZATION;  Surgeon: Romie Levee, MD;  Location: [facility];  Service: General;  Laterality: N/A;  . COLOSTOMY N/A 12/03/2020   Procedure: COLOSTOMY;  Surgeon: Romie Levee, MD;  Location: [facility];  Service: General;  Laterality: N/A;  . JOINT REPLACEMENT  2002   right total     Allergies  Allergen Reactions  . Bactrim [Sulfamethoxazole-Trimethoprim] Nausea Only  . Ditropan [Oxybutynin]     Made Sara Logan feel bad  . Sulfa Antibiotics     Other reaction(s): stomach upset    Allergies as of 10/04/2023       Reactions   Bactrim [sulfamethoxazole-trimethoprim] Nausea Only   Ditropan [oxybutynin]    Made Sara Logan feel bad   Sulfa Antibiotics    Other reaction(s): stomach upset        Medication List        Accurate as of October 04, 2023 11:59 PM. If you have  any questions, ask your nurse or doctor.          acetaminophen 325 MG tablet Commonly known as: TYLENOL Take 650 mg by mouth 3 (three) times daily. SCHEDULED, See as needed dosing also   amLODipine 5 MG tablet Commonly known as: NORVASC Take 5 mg by mouth daily.   ANTACID & ANTIGAS PO Take 30 mLs by mouth every 4 (four) hours as needed. for Indigestion   aspirin EC 81 MG tablet Take 81 mg by mouth daily. Swallow whole.   calcium carbonate 1500 (600 Ca) MG Tabs tablet Commonly known as: OSCAL Take 600 mg of elemental calcium by mouth daily.   cyanocobalamin 1000 MCG tablet Commonly known as: VITAMIN B12 Take 1,000 mcg by mouth daily.   fluticasone 50 MCG/ACT nasal spray Commonly known as: FLONASE Place 1 spray into both nostrils 2 (two) times daily.   loperamide 2 MG tablet Commonly known as: IMODIUM A-D Take 2 mg by mouth as needed for diarrhea or loose stools.   LORazepam 0.5 MG tablet Commonly known as: ATIVAN Take 0.5 mg by mouth every 8 (eight) hours as needed for anxiety.   Magnesium 300 MG Caps Take 300 mg by mouth daily.   magnesium hydroxide 400 MG/5ML suspension Commonly known as: MILK OF MAGNESIA Take 30 mLs by mouth daily as needed for mild constipation.   minoxidil 2 % external solution Commonly known as: ROGAINE Apply topically daily as needed.   pantoprazole 40 MG tablet Commonly known as: PROTONIX Take 40 mg by mouth daily.   polyethylene glycol 17 g packet Commonly known as: MIRALAX / GLYCOLAX Take 17 g by mouth 3 (three) times daily.   potassium chloride SA 20 MEQ tablet Commonly known as: KLOR-CON M Take 20 mEq by mouth daily.   rosuvastatin 10 MG tablet Commonly known as: CRESTOR Take 10 mg by mouth at bedtime.   Senna 8.6 MG Caps Take one to two capsules as needed at bedtime If no bowel movements in 3 days   simethicone 80 MG chewable tablet Commonly known as: MYLICON Chew 1 tablet (80 mg total) by mouth 4 (four) times  daily.   SYSTANE BALANCE  OP Apply 1 drop to eye every evening. related to PRESENCE OF INTRAOCULAR LENS (Z96.1) wait 10 minutes between Systane and timolol drops   timolol 0.5 % ophthalmic solution Commonly known as: TIMOPTIC Place 1 drop into the left eye 2 (two) times daily. related to PRESENCE OF INTRAOCULAR LENS (Z96.1) Wait 10 minutes between Systane and timolol drops   venlafaxine XR 75 MG 24 hr capsule Commonly known as: EFFEXOR-XR TAKE 2 CAPSULES (150MG ) BY MOUTH DAILY.   Vitamin D3 50 MCG (2000 UT) capsule Take 2,000 Units by mouth daily.   Voltaren Arthritis Pain 1 % Gel Generic drug: diclofenac Sodium Apply 2 g topically 3 (three) times daily as needed (Left wrist).        Review of Systems  Constitutional:  Negative for appetite change, fatigue and fever.  HENT:  Negative for congestion, hearing loss, rhinorrhea and trouble swallowing.   Eyes:  Negative for visual disturbance.  Respiratory:  Positive for shortness of breath. Negative for cough and wheezing.   Cardiovascular:  Positive for leg swelling.  Gastrointestinal:  Negative for abdominal pain and constipation.  Genitourinary:  Positive for frequency. Negative for dysuria and urgency.  Musculoskeletal:  Positive for arthralgias, back pain and gait problem.       Left knee pain is chronic.   Skin:  Negative for color change.  Neurological:  Negative for speech difficulty and weakness.  Psychiatric/Behavioral:  Negative for confusion and sleep disturbance. The Sara Logan is not nervous/anxious.     Immunization History  Administered Date(s) Administered  . Fluad Quad(high Dose 65+) 06/13/2019, 07/02/2022  . Influenza Split 06/18/2012  . Influenza Whole 07/11/2010  . Influenza, High Dose Seasonal PF 07/07/2013, 07/14/2014, 06/24/2015, 06/13/2016, 06/04/2017, 06/17/2018  . Influenza-Unspecified 06/22/2020, 06/29/2021, 06/14/2023  . Moderna Covid-19 Vaccine Bivalent Booster 45yrs & up 01/26/2022  . Moderna  SARS-COV2 Booster Vaccination 07/19/2020, 02/07/2021, 06/26/2023, 06/26/2023  . Moderna Sars-Covid-2 Vaccination 09/14/2019, 10/12/2019  . PFIZER(Purple Top)SARS-COV-2 Vaccination 05/30/2021  . Research officer, trade [employer] 50yrs & up 07/12/2022  . Pneumococcal Conjugate-13 04/27/2014, 08/22/2021  . Pneumococcal Polysaccharide-23 10/15/2017  . Td 09/10/2005  . Zoster Recombinant(Shingrix) 08/22/2021, 10/25/2021, 02/28/2022  . Zoster, Live 11/29/2006   Pertinent  Health Maintenance Due  Topic Date Due  . INFLUENZA VACCINE  Completed  . DEXA SCAN  Completed      12/14/2020    5:00 AM 12/14/2020    8:00 AM 12/14/2020    8:29 PM 12/15/2020    8:44 AM 10/02/2022    9:39 AM  Fall Risk  Falls in the past year?     0  Was there an injury with Fall?     0  Fall Risk Category Calculator     0  (RETIRED) Sara Logan Fall Risk Level Moderate fall risk Moderate fall risk Moderate fall risk High fall risk   Sara Logan at Risk for Falls Due to     No Fall Risks  Fall risk Follow up     Falls evaluation completed   Functional Status Survey:    Vitals:   10/04/23 1312 10/07/23 1059  BP: (!) 159/80 (!) 159/80  Pulse: 88   Resp: 19   Temp: 98.9 F (37.2 C)   SpO2: 92%   Weight: 168 lb 12.8 oz (76.6 kg)    Body mass index is 29.9 kg/m. Physical Exam Vitals and nursing note reviewed.  Constitutional:      Appearance: Normal appearance.  HENT:     Head: Normocephalic and atraumatic.  Nose: Nose normal.     Mouth/Throat:     Mouth: Mucous membranes are moist.  Eyes:     Extraocular Movements: Extraocular movements intact.     Conjunctiva/sclera: Conjunctivae normal.     Pupils: Pupils are equal, round, and reactive to light.  Cardiovascular:     Rate and Rhythm: Normal rate and regular rhythm.     Heart sounds: No murmur heard. Pulmonary:     Effort: Pulmonary effort is normal.     Breath sounds: No rales.  Abdominal:     General: Bowel sounds are normal.     Palpations:  Abdomen is soft.     Tenderness: There is no abdominal tenderness.     Comments: Colostomy  Musculoskeletal:        General: No tenderness.     Cervical back: Normal range of motion and neck supple.     Right lower leg: Edema present.     Left lower leg: Edema present.     Comments: mid to lower back pain is better, only positional.  LLE edema 1-2+, RLE trace   Skin:    General: Skin is warm and dry.     Findings: Erythema present.     Comments: Mid abd surgical scar. Colostomy is functioning.  Chronic venous insufficiency skin changes BLE, much warmer LLE>RLE, no tenderness in calves palpated or with dorsiflexion of the R and L foot.   Neurological:     General: No focal deficit present.     Mental Status: She is alert and oriented to person, place, and time. Mental status is at baseline.     Gait: Gait abnormal.  Psychiatric:        Mood and Affect: Mood normal.        Behavior: Behavior normal.        Thought Content: Thought content normal.        Judgment: Judgment normal.    Labs reviewed: Recent Labs    05/30/23 0000 06/21/23 0000  NA 138 134*  K 3.8 3.9  CL 103 99  CO2 25* 26*  BUN 10 15  CREATININE 0.6 0.7  CALCIUM 9.4 9.2   Recent Labs    05/30/23 0000 06/25/23 0000  AST 16 17  ALT 13 15  ALKPHOS 72 71  ALBUMIN 3.9 3.7   Recent Labs    05/30/23 0000 06/21/23 0000  WBC 5.4 6.0  NEUTROABS 3,434.00 3,630.00  HGB 12.9 13.7  HCT 39 43  PLT 125* 135*   Lab Results  Component Value Date   TSH 1.43 05/30/2023   Lab Results  Component Value Date   HGBA1C 5.9 (H) 09/26/2015   Lab Results  Component Value Date   CHOL 128 05/30/2023   HDL 51 05/30/2023   LDLCALC 58 05/30/2023   LDLDIRECT 147.0 11/17/2012   TRIG 106 05/30/2023   CHOLHDL 3 02/10/2020    Significant Diagnostic Results in last 30 days:  No results found.  Assessment/Plan: Venous insufficiency of both lower extremities d pain except mild discomfort, not change since treated  with Doxy for ? Cellulitis, on Furosemide was increased to 20mg  every day 10/02/23  The Sara Logan will think about further diagnostic testing CT abd/pelvis w/wo CM to evaluate intraabdominal process  1/16/25Venous [place] negative for DVT, noted backer's cyst. 09/30/23 ABT L 1.1, R not obtained, possible DVT in popliteal vein, started Eliquis subsequently 10/03/23 repeated venous US no acute DVT, discontinued Eliquis  10/04/23 may increase Furosemide if needed.   Colostomy  status (HCC) 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, s/p Hartman's procedure, splenic flexure mobilization   Essential hypertension Intermittent elevated Sbp, increased Furosemide should help, continue Amlodipine, Bun/creat 19/0.8 10/01/23  Hyperlipidemia LDL goal <70  takes Rosuvastatin, LDL 72 12/20/20  Depression, recurrent (HCC)  Anxious related to recently increased edema in LLE, takes Venlafaxine, MMSE 30/30 09/03/23  Hypokalemia supplemented, takes Kcl, serum K 3.6 10/01/23    Family/ staff Communication: plan of care reviewed with the Sara Logan and charge nurse.   Labs/tests ordered:  none  Time spend 40 minutes.

## 2023-10-04 NOTE — Assessment & Plan Note (Signed)
Intermittent elevated Sbp, increased Furosemide should help, continue Amlodipine, Bun/creat 19/0.8 10/01/23

## 2023-10-04 NOTE — Assessment & Plan Note (Signed)
supplemented, takes Kcl, serum K 3.6 10/01/23

## 2023-10-04 NOTE — Assessment & Plan Note (Signed)
takes Rosuvastatin, LDL 72 12/20/20

## 2023-10-04 NOTE — Assessment & Plan Note (Addendum)
d pain except mild discomfort, not change since treated with Doxy for ? Cellulitis, on Furosemide was increased to 20mg  every day 10/02/23  The patient will think about further diagnostic testing CT abd/pelvis w/wo CM to evaluate intraabdominal process  1/16/25Venous [place] negative for DVT, noted backer's cyst. 09/30/23 ABT L 1.1, R not obtained, possible DVT in popliteal vein, started Eliquis subsequently 10/03/23 repeated venous US no acute DVT, discontinued Eliquis  10/04/23 may increase Furosemide if needed.

## 2023-10-09 ENCOUNTER — Non-Acute Institutional Stay: Payer: Medicare Other | Admitting: Nurse Practitioner

## 2023-10-09 ENCOUNTER — Encounter: Payer: Self-pay | Admitting: Nurse Practitioner

## 2023-10-09 DIAGNOSIS — I872 Venous insufficiency (chronic) (peripheral): Secondary | ICD-10-CM | POA: Diagnosis not present

## 2023-10-09 DIAGNOSIS — F339 Major depressive disorder, recurrent, unspecified: Secondary | ICD-10-CM | POA: Diagnosis not present

## 2023-10-09 DIAGNOSIS — E876 Hypokalemia: Secondary | ICD-10-CM | POA: Diagnosis not present

## 2023-10-09 DIAGNOSIS — I10 Essential (primary) hypertension: Secondary | ICD-10-CM

## 2023-10-09 DIAGNOSIS — Z433 Encounter for attention to colostomy: Secondary | ICD-10-CM

## 2023-10-09 NOTE — Assessment & Plan Note (Signed)
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

## 2023-10-09 NOTE — Assessment & Plan Note (Signed)
Switch from Amlodipine to Metoprolol,  Bun/creat 19/0.8 10/01/23

## 2023-10-09 NOTE — Assessment & Plan Note (Signed)
supplemented, takes Kcl, serum K 3.6 10/01/23

## 2023-10-09 NOTE — Assessment & Plan Note (Signed)
Persisted left lower leg edema, mild warmth and redness, denied pain except mild discomfort, not change since treated with Doxy for ? Cellulitis, on Furosemide was increased to 20mg  every day 10/02/23             The patient will think about further diagnostic testing CT abd/pelvis w/wo CM to evaluate intraabdominal process  1/16/25Venous [place] negative for DVT, noted backer's cyst. 09/30/23 ABT L 1.1, R not obtained, possible DVT in popliteal vein, started Eliquis subsequently 10/03/23 repeated venous US no acute DVT, discontinued Eliquis  10/08/13 will switch from Amlodipine to Metoprolol for Bp control and possible eliminating contributory factor of edema. May consider vascular specialist for further evaluation.

## 2023-10-09 NOTE — Progress Notes (Signed)
Location:   [facility] Room Number: 913 Place of Service:  ALF (13) Provider: Arna Snipe Veryl Winemiller NP  Venita Sheffield, MD  Patient Care Team: Venita Sheffield, MD as PCP - General (Internal Medicine) Verner Chol, [facility] (Inactive) as Pharmacist (Pharmacist)  Extended Emergency Contact Information Primary Emergency Contact: Julio Sicks [place] Phone: [phone] Relation: Daughter  Code Status: DNR Goals of care: Advanced Directive information    07/12/2023    3:37 PM  Advanced Directives  Does Patient Have a Medical Advance Directive? Yes  Type of Estate agent of [place];Living will  Does patient want to make changes to medical advance directive? No - Patient declined  Copy of Healthcare Power of Attorney in Chart? Yes - validated most recent copy scanned in chart (See row information)     Chief Complaint  Patient presents with  . Acute Visit    Persisted left lower leg edema, elevated Bp    HPI:  Pt is a 89 y.o. female seen today for an acute visit for persisted LLE edema, mild elevated Sbp, otherwise the patient is in her usual state of health.   Persisted left lower leg edema, mild warmth and redness, denied pain except mild discomfort, not change since treated with Doxy for ? Cellulitis, on Furosemide was increased to 20mg  every day 10/02/23             The patient will think about further diagnostic testing CT abd/pelvis w/wo CM to evaluate intraabdominal process  1/16/25Venous [place] negative for DVT, noted backer's cyst. 09/30/23 ABT L 1.1, R not obtained, possible DVT in popliteal vein, started Eliquis subsequently 10/03/23 repeated venous US no acute DVT, discontinued Eliquis                 Fell 05/09/23 when the patient tripped over the entrance of dinning room, mid to lower back pain is better controlled, X-ray 05/16/23 lumbar spine, pelvis, hips showed no acute abnormality. Tylenol is effective now.               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, s/p Hartman's procedure, splenic flexure mobilization              HTN takes Amlodipine Bun/creat 19/0.8 10/01/23             Hypomagnesemia, Mg 1.8 12/20/20             Vit B12 deficiency, takes Vit B, Vit B12 942             Hx of CVA w/o residual deficit             Hyperlipidemia, takes Rosuvastatin, LDL 72 12/20/20             Depression, takes Venlafaxine, MMSE 30/30 09/03/23             Constipation, takes MiraLax              Post op anemia, off Fe, Hgb 12.3 10/01/23             Hypokalemia, supplemented, takes Kcl, serum K 3.6 10/01/23             Allergic rhinitis, takes Flonase              OA, mid to lower back, takes Tylenol.  Past Medical History:  Diagnosis Date  . Arthritis   . COVID   . Depression   . Hyperlipidemia   . Osteopenia   . Stroke (HCC)   . Thrombocytopenia (HCC)   . Vitamin D deficiency   . Wrist fracture 09/10/2000   left   Past Surgical History:  Procedure Laterality Date  . APPENDECTOMY  1967  . CARPAL TUNNEL RELEASE  2008   right  . COLON RESECTION SIGMOID N/A 12/03/2020   Procedure: EXPLORATORY LAPAROTOMY; HARTMAN'S PROCEDURE; SPLEENIC FLEXURE MOBILIZATION;  Surgeon: Romie Levee, MD;  Location: [facility];  Service: General;  Laterality: N/A;  . COLOSTOMY N/A 12/03/2020   Procedure: COLOSTOMY;  Surgeon: Romie Levee, MD;  Location: [facility];  Service: General;  Laterality: N/A;  . JOINT REPLACEMENT  2002   right total     Allergies  Allergen Reactions  . Bactrim [Sulfamethoxazole-Trimethoprim] Nausea Only  . Ditropan [Oxybutynin]     Made patient feel bad  . Sulfa Antibiotics     Other reaction(s): stomach upset    Allergies as of 10/09/2023       Reactions   Bactrim [sulfamethoxazole-trimethoprim] Nausea Only   Ditropan [oxybutynin]    Made patient feel bad   Sulfa Antibiotics     Other reaction(s): stomach upset        Medication List        Accurate as of October 09, 2023 12:55 PM. If you have any questions, ask your nurse or doctor.          acetaminophen 325 MG tablet Commonly known as: TYLENOL Take 650 mg by mouth 3 (three) times daily. SCHEDULED, See as needed dosing also   amLODipine 5 MG tablet Commonly known as: NORVASC Take 5 mg by mouth daily.   ANTACID & ANTIGAS PO Take 30 mLs by mouth every 4 (four) hours as needed. for Indigestion   aspirin EC 81 MG tablet Take 81 mg by mouth daily. Swallow whole.   calcium carbonate 1500 (600 Ca) MG Tabs tablet Commonly known as: OSCAL Take 600 mg of elemental calcium by mouth daily.   cyanocobalamin 1000 MCG tablet Commonly known as: VITAMIN B12 Take 1,000 mcg by mouth daily.   fluticasone 50 MCG/ACT nasal spray Commonly known as: FLONASE Place 1 spray into both nostrils 2 (two) times daily.   loperamide 2 MG tablet Commonly known as: IMODIUM A-D Take 2 mg by mouth as needed for diarrhea or loose stools.   LORazepam 0.5 MG tablet Commonly known as: ATIVAN Take 0.5 mg by mouth every 8 (eight) hours as needed for anxiety.   Magnesium 300 MG Caps Take 300 mg by mouth daily.   magnesium hydroxide 400 MG/5ML suspension Commonly known as: MILK OF MAGNESIA Take 30 mLs by mouth daily as needed for mild constipation.   minoxidil 2 % external solution Commonly known as: ROGAINE Apply topically daily as needed.   pantoprazole 40 MG tablet Commonly known as: PROTONIX Take 40 mg by mouth daily.   polyethylene glycol 17 g packet Commonly known as: MIRALAX / GLYCOLAX Take 17 g by mouth 3 (three) times daily.   potassium chloride SA 20 MEQ tablet Commonly known as: KLOR-CON M Take 20 mEq by mouth daily.   rosuvastatin 10 MG tablet Commonly known as: CRESTOR Take 10 mg by mouth at bedtime.   Senna 8.6 MG Caps Take one to two capsules as needed at bedtime If no bowel movements in 3  days   simethicone 80 MG chewable tablet  Commonly known as: MYLICON Chew 1 tablet (80 mg total) by mouth 4 (four) times daily.   SYSTANE BALANCE OP Apply 1 drop to eye every evening. related to PRESENCE OF INTRAOCULAR LENS (Z96.1) wait 10 minutes between Systane and timolol drops   timolol 0.5 % ophthalmic solution Commonly known as: TIMOPTIC Place 1 drop into the left eye 2 (two) times daily. related to PRESENCE OF INTRAOCULAR LENS (Z96.1) Wait 10 minutes between Systane and timolol drops   venlafaxine XR 75 MG 24 hr capsule Commonly known as: EFFEXOR-XR TAKE 2 CAPSULES (150MG ) BY MOUTH DAILY.   Vitamin D3 50 MCG (2000 UT) capsule Take 2,000 Units by mouth daily.   Voltaren Arthritis Pain 1 % Gel Generic drug: diclofenac Sodium Apply 2 g topically 3 (three) times daily as needed (Left wrist).        Review of Systems  Constitutional:  Negative for appetite change, fatigue and fever.  HENT:  Negative for congestion, hearing loss, rhinorrhea and trouble swallowing.   Eyes:  Negative for visual disturbance.  Respiratory:  Positive for shortness of breath. Negative for cough and wheezing.   Cardiovascular:  Positive for leg swelling.  Gastrointestinal:  Negative for abdominal pain and constipation.  Genitourinary:  Positive for frequency. Negative for dysuria and urgency.  Musculoskeletal:  Positive for arthralgias, back pain and gait problem.       Left knee pain is chronic.   Skin:  Negative for color change.  Neurological:  Negative for speech difficulty and weakness.  Psychiatric/Behavioral:  Negative for confusion and sleep disturbance. The patient is not nervous/anxious.     Immunization History  Administered Date(s) Administered  . Fluad Quad(high Dose 65+) 06/13/2019, 07/02/2022  . Influenza Split 06/18/2012  . Influenza Whole 07/11/2010  . Influenza, High Dose Seasonal PF 07/07/2013, 07/14/2014, 06/24/2015, 06/13/2016, 06/04/2017, 06/17/2018  .  Influenza-Unspecified 06/22/2020, 06/29/2021, 06/14/2023  . Moderna Covid-19 Vaccine Bivalent Booster 52yrs & up 01/26/2022  . Moderna SARS-COV2 Booster Vaccination 07/19/2020, 02/07/2021, 06/26/2023, 06/26/2023  . Moderna Sars-Covid-2 Vaccination 09/14/2019, 10/12/2019  . PFIZER(Purple Top)SARS-COV-2 Vaccination 05/30/2021  . Research officer, trade [employer] 30yrs & up 07/12/2022  . Pneumococcal Conjugate-13 04/27/2014, 08/22/2021  . Pneumococcal Polysaccharide-23 10/15/2017  . Td 09/10/2005  . Zoster Recombinant(Shingrix) 08/22/2021, 10/25/2021, 02/28/2022  . Zoster, Live 11/29/2006   Pertinent  Health Maintenance Due  Topic Date Due  . INFLUENZA VACCINE  Completed  . DEXA SCAN  Completed      12/14/2020    5:00 AM 12/14/2020    8:00 AM 12/14/2020    8:29 PM 12/15/2020    8:44 AM 10/02/2022    9:39 AM  Fall Risk  Falls in the past year?     0  Was there an injury with Fall?     0  Fall Risk Category Calculator     0  (RETIRED) Patient Fall Risk Level Moderate fall risk Moderate fall risk Moderate fall risk High fall risk   Patient at Risk for Falls Due to     No Fall Risks  Fall risk Follow up     Falls evaluation completed   Functional Status Survey:    Vitals:   10/09/23 1122  BP: (!) 159/80  Pulse: 88  Resp: 19  Temp: 98.9 F (37.2 C)  SpO2: 92%  Weight: 168 lb 12.8 oz (76.6 kg)   Body mass index is 29.9 kg/m. Physical Exam Vitals and nursing note reviewed.  Constitutional:      Appearance: Normal appearance.  HENT:  Head: Normocephalic and atraumatic.     Nose: Nose normal.     Mouth/Throat:     Mouth: Mucous membranes are moist.  Eyes:     Extraocular Movements: Extraocular movements intact.     Conjunctiva/sclera: Conjunctivae normal.     Pupils: Pupils are equal, round, and reactive to light.  Cardiovascular:     Rate and Rhythm: Normal rate and regular rhythm.     Heart sounds: No murmur heard. Pulmonary:     Effort: Pulmonary effort is  normal.     Breath sounds: No rales.  Abdominal:     General: Bowel sounds are normal.     Palpations: Abdomen is soft.     Tenderness: There is no abdominal tenderness.     Comments: Colostomy  Musculoskeletal:        General: No tenderness.     Cervical back: Normal range of motion and neck supple.     Right lower leg: Edema present.     Left lower leg: Edema present.     Comments: mid to lower back pain is better, only positional.  LLE edema 1-2+, RLE trace   Skin:    General: Skin is warm and dry.     Findings: Erythema present.     Comments: Mid abd surgical scar. Colostomy is functioning.  Chronic venous insufficiency skin changes BLE, much warmer LLE>RLE, no tenderness in calves palpated or with dorsiflexion of the R and L foot.   Neurological:     General: No focal deficit present.     Mental Status: She is alert and oriented to person, place, and time. Mental status is at baseline.     Gait: Gait abnormal.  Psychiatric:        Mood and Affect: Mood normal.        Behavior: Behavior normal.        Thought Content: Thought content normal.        Judgment: Judgment normal.    Labs reviewed: Recent Labs    05/30/23 0000 06/21/23 0000  NA 138 134*  K 3.8 3.9  CL 103 99  CO2 25* 26*  BUN 10 15  CREATININE 0.6 0.7  CALCIUM 9.4 9.2   Recent Labs    05/30/23 0000 06/25/23 0000  AST 16 17  ALT 13 15  ALKPHOS 72 71  ALBUMIN 3.9 3.7   Recent Labs    05/30/23 0000 06/21/23 0000  WBC 5.4 6.0  NEUTROABS 3,434.00 3,630.00  HGB 12.9 13.7  HCT 39 43  PLT 125* 135*   Lab Results  Component Value Date   TSH 1.43 05/30/2023   Lab Results  Component Value Date   HGBA1C 5.9 (H) 09/26/2015   Lab Results  Component Value Date   CHOL 128 05/30/2023   HDL 51 05/30/2023   LDLCALC 58 05/30/2023   LDLDIRECT 147.0 11/17/2012   TRIG 106 05/30/2023   CHOLHDL 3 02/10/2020    Significant Diagnostic Results in last 30 days:  MM 3D SCREENING MAMMOGRAM BILATERAL  BREAST Result Date: 10/07/2023 CLINICAL DATA:  Screening. EXAM: DIGITAL SCREENING BILATERAL MAMMOGRAM WITH TOMOSYNTHESIS AND CAD TECHNIQUE: Bilateral screening digital craniocaudal and mediolateral oblique mammograms were obtained. Bilateral screening digital breast tomosynthesis was performed. The images were evaluated with computer-aided detection. COMPARISON:  Previous exam(s). ACR Breast Density Category b: There are scattered areas of fibroglandular density. FINDINGS: There are no findings suspicious for malignancy. IMPRESSION: No mammographic evidence of malignancy. A result letter of this screening mammogram will be  mailed directly to the patient. RECOMMENDATION: Screening mammogram in one year. (Code:SM-B-01Y) BI-RADS CATEGORY  1: Negative. Electronically Signed   By: Annia Belt M.D.   On: 10/07/2023 16:08    Assessment/Plan: Venous insufficiency of both lower extremities Persisted left lower leg edema, mild warmth and redness, denied pain except mild discomfort, not change since treated with Doxy for ? Cellulitis, on Furosemide was increased to 20mg  every day 10/02/23             The patient will think about further diagnostic testing CT abd/pelvis w/wo CM to evaluate intraabdominal process  1/16/25Venous [place] negative for DVT, noted backer's cyst. 09/30/23 ABT L 1.1, R not obtained, possible DVT in popliteal vein, started Eliquis subsequently 10/03/23 repeated venous US no acute DVT, discontinued Eliquis  10/08/13 will switch from Amlodipine to Metoprolol for Bp control and possible eliminating contributory factor of edema. May consider vascular specialist for further evaluation.    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   Labs/tests ordered:  none  Time spend 40 minutes.

## 2023-10-09 NOTE — Assessment & Plan Note (Signed)
takes Venlafaxine, MMSE 30/30 09/03/23

## 2023-11-04 ENCOUNTER — Non-Acute Institutional Stay: Payer: Self-pay | Admitting: Nurse Practitioner

## 2023-11-04 ENCOUNTER — Encounter: Payer: Self-pay | Admitting: Nurse Practitioner

## 2023-11-04 DIAGNOSIS — E785 Hyperlipidemia, unspecified: Secondary | ICD-10-CM

## 2023-11-04 DIAGNOSIS — R0600 Dyspnea, unspecified: Secondary | ICD-10-CM

## 2023-11-04 DIAGNOSIS — Z933 Colostomy status: Secondary | ICD-10-CM | POA: Diagnosis not present

## 2023-11-04 DIAGNOSIS — K5904 Chronic idiopathic constipation: Secondary | ICD-10-CM

## 2023-11-04 DIAGNOSIS — I10 Essential (primary) hypertension: Secondary | ICD-10-CM

## 2023-11-04 DIAGNOSIS — F339 Major depressive disorder, recurrent, unspecified: Secondary | ICD-10-CM

## 2023-11-04 DIAGNOSIS — I878 Other specified disorders of veins: Secondary | ICD-10-CM

## 2023-11-04 NOTE — Assessment & Plan Note (Signed)
 takes Venlafaxine, MMSE 30/30 09/03/23

## 2023-11-04 NOTE — Assessment & Plan Note (Signed)
 Reported SOB when lying down, denied cough or phlegm production, ? Palpitation, no O2 desaturation, noted rales LLL BLE edema, L>R, venous US negative DVT, occasional pain in legs.  EKG, CXR, echocardiogram, CBC/diff, CMP/eGFR, BNP Declined ED evaluation, CT of abd, desires staff [facility] to call vascular specialist appointment sooner than 12/05/23

## 2023-11-04 NOTE — Assessment & Plan Note (Signed)
 Persisted left lower leg edema, mild warmth and redness, denied pain except mild discomfort, not change since treated with Doxy for ? Cellulitis, on Furosemide was increased to 20mg  every day 10/02/23

## 2023-11-04 NOTE — Assessment & Plan Note (Signed)
 takes Rosuvastatin, LDL 72 12/20/20

## 2023-11-04 NOTE — Assessment & Plan Note (Signed)
 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

## 2023-11-04 NOTE — Assessment & Plan Note (Signed)
 Blood pressure is controlled, takes Metoprolo Bun/creat 19/0.8 10/01/23

## 2023-11-04 NOTE — Progress Notes (Signed)
 Location:   [facility] Room Number: 913 Place of Service:  AL[facility] (13) Provider: Arna Snipe Jerl Munyan NP  Venita Sheffield, MD  Patient Care Team: Venita Sheffield, MD as PCP - General (Internal Medicine) Verner Chol, [facility] (Inactive) as Pharmacist (Pharmacist)  Extended Emergency Contact Information Primary Emergency Contact: Julio Sicks [place] Phone: [phone] Relation: Daughter  Code Status: DNR Goals of care: Advanced Directive information    07/12/2023    3:37 PM  Advanced Directives  Does Patient Have a Medical Advance Directive? Yes  Type of Estate agent of [place];Living will  Does patient want to make changes to medical advance directive? No - Patient declined  Copy of Healthcare Power of Attorney in Chart? Yes - validated most recent copy scanned in chart (See row information)     Chief Complaint  Patient presents with   Acute Visit    HPI:  Pt is a 89 y.o. female seen today for an acute visit for Reported SOB when lying down, denied cough or phlegm production, ? Palpitation, no O2 desaturation, noted rales LLL BLE edema, L>R, venous US negative DVT, occasional pain in legs.                 Persisted left lower leg edema, mild warmth and redness, denied pain except mild discomfort, not change since treated with Doxy for ? Cellulitis, on Furosemide was increased to 20mg  every day 10/02/23             The patient will think about further diagnostic testing CT abd/pelvis w/wo CM to evaluate intraabdominal process  1/16/25Venous [place] negative for DVT, noted backer's cyst. 09/30/23 ABT L 1.1, R not obtained, possible DVT in popliteal vein, started Eliquis subsequently 10/03/23 repeated venous US no acute DVT, discontinued Eliquis                 Fell 05/09/23 when the patient tripped over the entrance of dinning room, mid to lower back pain is better controlled, X-ray 05/16/23 lumbar spine, pelvis, hips showed no  acute abnormality. Tylenol is effective now.              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, s/p Hartman's procedure, splenic flexure mobilization              HTN takes Metoprolo Bun/creat 19/0.8 10/01/23             Hypomagnesemia, Mg 1.8 12/20/20             Vit B12 deficiency, takes Vit B, Vit B12 942             Hx of CVA w/o residual deficit             Hyperlipidemia, takes Rosuvastatin, LDL 72 12/20/20             Depression, takes Venlafaxine, MMSE 30/30 09/03/23             Constipation, takes MiraLax, MOM, Senokot, Colace             Post op anemia, off Fe, Hgb 12.3 10/01/23             Hypokalemia, supplemented, takes Kcl, serum K 3.6 10/01/23             Allergic rhinitis, takes Flonase  OA, mid to lower back, takes Tylenol.                       Past Medical History:  Diagnosis Date   Arthritis    COVID    Depression    Hyperlipidemia    Osteopenia    Stroke (HCC)    Thrombocytopenia (HCC)    Vitamin D deficiency    Wrist fracture 09/10/2000   left   Past Surgical History:  Procedure Laterality Date   APPENDECTOMY  1967   CARPAL TUNNEL RELEASE  2008   right   COLON RESECTION SIGMOID N/A 12/03/2020   Procedure: EXPLORATORY LAPAROTOMY; HARTMAN'S PROCEDURE; SPLEENIC FLEXURE MOBILIZATION;  Surgeon: Romie Levee, MD;  Location: [facility];  Service: General;  Laterality: N/A;   COLOSTOMY N/A 12/03/2020   Procedure: COLOSTOMY;  Surgeon: Romie Levee, MD;  Location: [facility];  Service: General;  Laterality: N/A;   JOINT REPLACEMENT  2002   right total     Allergies  Allergen Reactions   Bactrim [Sulfamethoxazole-Trimethoprim] Nausea Only   Ditropan [Oxybutynin]     Made patient feel bad   Sulfa Antibiotics     Other reaction(s): stomach upset    Allergies as of 11/04/2023       Reactions   Bactrim [sulfamethoxazole-trimethoprim] Nausea Only   Ditropan  [oxybutynin]    Made patient feel bad   Sulfa Antibiotics    Other reaction(s): stomach upset        Medication List        Accurate as of November 04, 2023 12:04 PM. If you have any questions, ask your nurse or doctor.          acetaminophen 325 MG tablet Commonly known as: TYLENOL Take 650 mg by mouth 3 (three) times daily. SCHEDULED, See as needed dosing also   amLODipine 5 MG tablet Commonly known as: NORVASC Take 5 mg by mouth daily.   ANTACID & ANTIGAS PO Take 30 mLs by mouth every 4 (four) hours as needed. for Indigestion   aspirin EC 81 MG tablet Take 81 mg by mouth daily. Swallow whole.   calcium carbonate 1500 (600 Ca) MG Tabs tablet Commonly known as: OSCAL Take 600 mg of elemental calcium by mouth daily.   cyanocobalamin 1000 MCG tablet Commonly known as: VITAMIN B12 Take 1,000 mcg by mouth daily.   fluticasone 50 MCG/ACT nasal spray Commonly known as: FLONASE Place 1 spray into both nostrils 2 (two) times daily.   loperamide 2 MG tablet Commonly known as: IMODIUM A-D Take 2 mg by mouth as needed for diarrhea or loose stools.   LORazepam 0.5 MG tablet Commonly known as: ATIVAN Take 0.5 mg by mouth every 8 (eight) hours as needed for anxiety.   Magnesium 300 MG Caps Take 300 mg by mouth daily.   magnesium hydroxide 400 MG/5ML suspension Commonly known as: MILK OF MAGNESIA Take 30 mLs by mouth daily as needed for mild constipation.   minoxidil 2 % external solution Commonly known as: ROGAINE Apply topically daily as needed.   pantoprazole 40 MG tablet Commonly known as: PROTONIX Take 40 mg by mouth daily.   polyethylene glycol 17 g packet Commonly known as: MIRALAX / GLYCOLAX Take 17 g by mouth 3 (three) times daily.   potassium chloride SA 20 MEQ tablet Commonly known as: KLOR-CON M Take 20 mEq by mouth daily.   rosuvastatin 10 MG tablet Commonly known as: CRESTOR Take 10 mg by mouth at bedtime.  Senna 8.6 MG Caps Take one to  two capsules as needed at bedtime If no bowel movements in 3 days   simethicone 80 MG chewable tablet Commonly known as: MYLICON Chew 1 tablet (80 mg total) by mouth 4 (four) times daily.   SYSTANE BALANCE OP Apply 1 drop to eye every evening. related to PRESENCE OF INTRAOCULAR LENS (Z96.1) wait 10 minutes between Systane and timolol drops   timolol 0.5 % ophthalmic solution Commonly known as: TIMOPTIC Place 1 drop into the left eye 2 (two) times daily. related to PRESENCE OF INTRAOCULAR LENS (Z96.1) Wait 10 minutes between Systane and timolol drops   venlafaxine XR 75 MG 24 hr capsule Commonly known as: EFFEXOR-XR TAKE 2 CAPSULES (150MG ) BY MOUTH DAILY.   Vitamin D3 50 MCG (2000 UT) capsule Take 2,000 Units by mouth daily.   Voltaren Arthritis Pain 1 % Gel Generic drug: diclofenac Sodium Apply 2 g topically 3 (three) times daily as needed (Left wrist).        Review of Systems  Constitutional:  Negative for appetite change, fatigue and fever.  HENT:  Negative for congestion, hearing loss, rhinorrhea and trouble swallowing.   Eyes:  Negative for visual disturbance.  Respiratory:  Positive for shortness of breath. Negative for cough and wheezing.        SOB when lying down  Cardiovascular:  Positive for palpitations and leg swelling.  Gastrointestinal:  Negative for abdominal pain and constipation.  Genitourinary:  Positive for frequency. Negative for dysuria and urgency.  Musculoskeletal:  Positive for arthralgias, back pain and gait problem.       Left knee pain is chronic.   Skin:  Negative for color change.  Neurological:  Negative for speech difficulty and weakness.  Psychiatric/Behavioral:  Negative for confusion and sleep disturbance. The patient is not nervous/anxious.     Immunization History  Administered Date(s) Administered   Fluad Quad(high Dose 65+) 06/13/2019, 07/02/2022   Influenza Split 06/18/2012   Influenza Whole 07/11/2010   Influenza, High Dose  Seasonal PF 07/07/2013, 07/14/2014, 06/24/2015, 06/13/2016, 06/04/2017, 06/17/2018   Influenza-Unspecified 06/22/2020, 06/29/2021, 06/14/2023   Moderna Covid-19 Vaccine Bivalent Booster 30yrs & up 01/26/2022   Moderna SARS-COV2 Booster Vaccination 07/19/2020, 02/07/2021, 06/26/2023, 06/26/2023   Moderna Sars-Covid-2 Vaccination 09/14/2019, 10/12/2019   PFIZER(Purple Top)SARS-COV-2 Vaccination 05/30/2021   Pfizer Covid-19 Vaccine Bivalent Booster 55yrs & up 07/12/2022   Pneumococcal Conjugate-13 04/27/2014, 08/22/2021   Pneumococcal Polysaccharide-23 10/15/2017   Td 09/10/2005   Zoster Recombinant(Shingrix) 08/22/2021, 10/25/2021, 02/28/2022   Zoster, Live 11/29/2006   Pertinent  Health Maintenance Due  Topic Date Due   INFLUENZA VACCINE  Completed   DEXA SCAN  Completed      12/14/2020    5:00 AM 12/14/2020    8:00 AM 12/14/2020    8:29 PM 12/15/2020    8:44 AM 10/02/2022    9:39 AM  Fall Risk  Falls in the past year?     0  Was there an injury with Fall?     0  Fall Risk Category Calculator     0  (RETIRED) Patient Fall Risk Level Moderate fall risk Moderate fall risk Moderate fall risk High fall risk   Patient at Risk for Falls Due to     No Fall Risks  Fall risk Follow up     Falls evaluation completed   Functional Status Survey:    Vitals:   11/04/23 1147  BP: (!) 140/80  Pulse: 68  Resp: 18  Temp: 98 F (  36.7 C)  SpO2: 94%  Weight: 169 lb (76.7 kg)   Body mass index is 29.94 kg/m. Physical Exam Vitals and nursing note reviewed.  Constitutional:      Appearance: Normal appearance.  HENT:     Head: Normocephalic and atraumatic.     Nose: Nose normal.     Mouth/Throat:     Mouth: Mucous membranes are moist.  Eyes:     Extraocular Movements: Extraocular movements intact.     Conjunctiva/sclera: Conjunctivae normal.     Pupils: Pupils are equal, round, and reactive to light.  Cardiovascular:     Rate and Rhythm: Normal rate and regular rhythm.     Heart sounds:  No murmur heard. Pulmonary:     Effort: Pulmonary effort is normal.     Breath sounds: Rales present.     Comments: Posterior left base Abdominal:     General: Bowel sounds are normal.     Palpations: Abdomen is soft.     Tenderness: There is no abdominal tenderness.     Comments: Colostomy  Musculoskeletal:        General: No tenderness.     Cervical back: Normal range of motion and neck supple.     Right lower leg: Edema present.     Left lower leg: Edema present.     Comments: mid to lower back pain is better, only positional.  LLE edema 1+,  RLE trace   Skin:    General: Skin is warm and dry.     Findings: Erythema present.     Comments: Mid abd surgical scar. Colostomy is functioning.  Chronic venous insufficiency skin changes BLE, much warmer LLE>RLE, no tenderness in calves palpated or with dorsiflexion of the R and L foot.   Neurological:     General: No focal deficit present.     Mental Status: She is alert and oriented to person, place, and time. Mental status is at baseline.     Gait: Gait abnormal.  Psychiatric:        Mood and Affect: Mood normal.        Behavior: Behavior normal.        Thought Content: Thought content normal.        Judgment: Judgment normal.     Labs reviewed: Recent Labs    05/30/23 0000 06/21/23 0000  NA 138 134*  K 3.8 3.9  CL 103 99  CO2 25* 26*  BUN 10 15  CREATININE 0.6 0.7  CALCIUM 9.4 9.2   Recent Labs    05/30/23 0000 06/25/23 0000  AST 16 17  ALT 13 15  ALKPHOS 72 71  ALBUMIN 3.9 3.7   Recent Labs    05/30/23 0000 06/21/23 0000  WBC 5.4 6.0  NEUTROABS 3,434.00 3,630.00  HGB 12.9 13.7  HCT 39 43  PLT 125* 135*   Lab Results  Component Value Date   TSH 1.43 05/30/2023   Lab Results  Component Value Date   HGBA1C 5.9 (H) 09/26/2015   Lab Results  Component Value Date   CHOL 128 05/30/2023   HDL 51 05/30/2023   LDLCALC 58 05/30/2023   LDLDIRECT 147.0 11/17/2012   TRIG 106 05/30/2023   CHOLHDL 3  02/10/2020    Significant Diagnostic Results in last 30 days:  No results found.  Assessment/Plan: Dyspnea Reported SOB when lying down, denied cough or phlegm production, ? Palpitation, no O2 desaturation, noted rales LLL BLE edema, L>R, venous US negative DVT, occasional pain in legs.  EKG, CXR, echocardiogram, CBC/diff, CMP/eGFR, BNP Declined ED evaluation, CT of abd, desires staff [facility] to call vascular specialist appointment sooner than 12/05/23  Venous stasis Persisted left lower leg edema, mild warmth and redness, denied pain except mild discomfort, not change since treated with Doxy for ? 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   Labs/tests ordered: EKG, CXR, echocardiogram, CBC/diff, CMP/eGFR, BNP

## 2023-11-04 NOTE — Assessment & Plan Note (Signed)
 Stable, takes MiraLax, MOM, Senokot, Colace

## 2023-11-06 ENCOUNTER — Encounter: Payer: Self-pay | Admitting: Nurse Practitioner

## 2023-11-06 ENCOUNTER — Non-Acute Institutional Stay: Payer: Self-pay | Admitting: Nurse Practitioner

## 2023-11-06 DIAGNOSIS — R0600 Dyspnea, unspecified: Secondary | ICD-10-CM | POA: Diagnosis not present

## 2023-11-06 DIAGNOSIS — Z933 Colostomy status: Secondary | ICD-10-CM | POA: Diagnosis not present

## 2023-11-06 DIAGNOSIS — D500 Iron deficiency anemia secondary to blood loss (chronic): Secondary | ICD-10-CM

## 2023-11-06 DIAGNOSIS — I878 Other specified disorders of veins: Secondary | ICD-10-CM | POA: Diagnosis not present

## 2023-11-06 DIAGNOSIS — I10 Essential (primary) hypertension: Secondary | ICD-10-CM

## 2023-11-06 DIAGNOSIS — F339 Major depressive disorder, recurrent, unspecified: Secondary | ICD-10-CM

## 2023-11-06 DIAGNOSIS — E876 Hypokalemia: Secondary | ICD-10-CM

## 2023-11-06 NOTE — Assessment & Plan Note (Signed)
 Anxious, takes Venlafaxine, MMSE 30/30 09/03/23

## 2023-11-06 NOTE — Assessment & Plan Note (Signed)
 Blood pressure is controlled, takes Metoprolo Bun/creat 16/0.74 11/05/23

## 2023-11-06 NOTE — Progress Notes (Signed)
 Location:   [facility] Room Number: 913 Place of Service:  ALF (13) Provider: Arna Snipe Carlise Stofer NP  Venita Sheffield, MD  Patient Care Team: Venita Sheffield, MD as PCP - General (Internal Medicine) Verner Chol, [facility] (Inactive) as Pharmacist (Pharmacist)  Extended Emergency Contact Information Primary Emergency Contact: Julio Sicks [place] of [place] Phone: [phone] Relation: Daughter  Code Status: DNR Goals of care: Advanced Directive information    07/12/2023    3:37 PM  Advanced Directives  Does Patient Have a Medical Advance Directive? Yes  Type of Estate agent of [place];Living will  Does patient want to make changes to medical advance directive? No - Patient declined  Copy of Healthcare Power of Attorney in Chart? Yes - validated most recent copy scanned in chart (See row information)     Chief Complaint  Patient presents with   Acute Visit    SOB when lying down.     HPI:  Pt is a 89 y.o. female seen today for an acute visit for persisted c/o SOB when lying down, weight has been stable in the past 3 months, pending echocardiogram  11/04/23 EKG SR, RBBB, CXR no acute cardiopulmonary disease. 11/05/23 BNP 249 Denied cough or phlegm production, ? Palpitation, no O2 desaturation, noted rales LLL BLE edema, L>R, venous US negative DVT, occasional pain in legs. Declined ED evaluation, CT of abd, desires staff [facility] to call vascular specialist appointment sooner than 12/05/23  1/16/25Venous [place] negative for DVT, noted backer's cyst. 09/30/23 ABT L 1.1, R not obtained, possible DVT in popliteal vein, started Eliquis subsequently 10/03/23 repeated venous US no acute DVT, discontinued Eliquis                 Fell 05/09/23 when the patient tripped over the entrance of dinning room, mid to lower back pain is better controlled, X-ray 05/16/23 lumbar spine, pelvis, hips showed no acute abnormality. Tylenol is effective now.               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, s/p Hartman's procedure, splenic flexure mobilization              HTN takes Metoprolo Bun/creat 16/0.74 11/05/23             Hypomagnesemia, Mg 1.8 12/20/20             Vit B12 deficiency, takes Vit B, Vit B12 942             Hx of CVA w/o residual deficit             Hyperlipidemia, takes Rosuvastatin, LDL 72 12/20/20             Depression, takes Venlafaxine, MMSE 30/30 09/03/23             Constipation, takes MiraLax, MOM, Senokot, Colace             Post op anemia, off Fe, Hgb 11.1 11/05/23             Hypokalemia, supplemented, increased Kcl to every day 11/06/23,  serum K 3.2 11/05/23             Allergic rhinitis, takes Flonase              OA, mid to lower back, takes Tylenol.  Past Medical History:  Diagnosis Date   Arthritis    COVID    Depression    Hyperlipidemia    Osteopenia    Stroke (HCC)    Thrombocytopenia (HCC)    Vitamin D deficiency    Wrist fracture 09/10/2000   left   Past Surgical History:  Procedure Laterality Date   APPENDECTOMY  1967   CARPAL TUNNEL RELEASE  2008   right   COLON RESECTION SIGMOID N/A 12/03/2020   Procedure: EXPLORATORY LAPAROTOMY; HARTMAN'S PROCEDURE; SPLEENIC FLEXURE MOBILIZATION;  Surgeon: Romie Levee, MD;  Location: [facility];  Service: General;  Laterality: N/A;   COLOSTOMY N/A 12/03/2020   Procedure: COLOSTOMY;  Surgeon: Romie Levee, MD;  Location: [facility];  Service: General;  Laterality: N/A;   JOINT REPLACEMENT  2002   right total     Allergies  Allergen Reactions   Bactrim [Sulfamethoxazole-Trimethoprim] Nausea Only   Ditropan [Oxybutynin]     Made patient feel bad   Sulfa Antibiotics     Other reaction(s): stomach upset    Allergies as of 11/06/2023       Reactions   Bactrim [sulfamethoxazole-trimethoprim] Nausea Only   Ditropan [oxybutynin]    Made patient  feel bad   Sulfa Antibiotics    Other reaction(s): stomach upset        Medication List        Accurate as of November 06, 2023  2:50 PM. If you have any questions, ask your nurse or doctor.          acetaminophen 325 MG tablet Commonly known as: TYLENOL Take 650 mg by mouth 3 (three) times daily. SCHEDULED, See as needed dosing also   amLODipine 5 MG tablet Commonly known as: NORVASC Take 5 mg by mouth daily.   ANTACID & ANTIGAS PO Take 30 mLs by mouth every 4 (four) hours as needed. for Indigestion   aspirin EC 81 MG tablet Take 81 mg by mouth daily. Swallow whole.   calcium carbonate 1500 (600 Ca) MG Tabs tablet Commonly known as: OSCAL Take 600 mg of elemental calcium by mouth daily.   cyanocobalamin 1000 MCG tablet Commonly known as: VITAMIN B12 Take 1,000 mcg by mouth daily.   fluticasone 50 MCG/ACT nasal spray Commonly known as: FLONASE Place 1 spray into both nostrils 2 (two) times daily.   loperamide 2 MG tablet Commonly known as: IMODIUM A-D Take 2 mg by mouth as needed for diarrhea or loose stools.   LORazepam 0.5 MG tablet Commonly known as: ATIVAN Take 0.5 mg by mouth every 8 (eight) hours as needed for anxiety.   Magnesium 300 MG Caps Take 300 mg by mouth daily.   magnesium hydroxide 400 MG/5ML suspension Commonly known as: MILK OF MAGNESIA Take 30 mLs by mouth daily as needed for mild constipation.   minoxidil 2 % external solution Commonly known as: ROGAINE Apply topically daily as needed.   pantoprazole 40 MG tablet Commonly known as: PROTONIX Take 40 mg by mouth daily.   polyethylene glycol 17 g packet Commonly known as: MIRALAX / GLYCOLAX Take 17 g by mouth 3 (three) times daily.   potassium chloride SA 20 MEQ tablet Commonly known as: KLOR-CON M Take 20 mEq by mouth daily.   rosuvastatin 10 MG tablet Commonly known as: CRESTOR Take 10 mg by mouth at bedtime.   Senna 8.6 MG Caps Take one to two capsules as needed at  bedtime If no bowel movements in 3 days   simethicone 80 MG chewable  tablet Commonly known as: MYLICON Chew 1 tablet (80 mg total) by mouth 4 (four) times daily.   SYSTANE BALANCE OP Apply 1 drop to eye every evening. related to PRESENCE OF INTRAOCULAR LENS (Z96.1) wait 10 minutes between Systane and timolol drops   timolol 0.5 % ophthalmic solution Commonly known as: TIMOPTIC Place 1 drop into the left eye 2 (two) times daily. related to PRESENCE OF INTRAOCULAR LENS (Z96.1) Wait 10 minutes between Systane and timolol drops   venlafaxine XR 75 MG 24 hr capsule Commonly known as: EFFEXOR-XR TAKE 2 CAPSULES (150MG ) BY MOUTH DAILY.   Vitamin D3 50 MCG (2000 UT) capsule Take 2,000 Units by mouth daily.   Voltaren Arthritis Pain 1 % Gel Generic drug: diclofenac Sodium Apply 2 g topically 3 (three) times daily as needed (Left wrist).        Review of Systems  Constitutional:  Negative for appetite change, fatigue and fever.  HENT:  Negative for congestion, hearing loss, rhinorrhea and trouble swallowing.   Eyes:  Negative for visual disturbance.  Respiratory:  Positive for shortness of breath. Negative for cough and wheezing.        SOB when lying down  Cardiovascular:  Positive for palpitations and leg swelling.  Gastrointestinal:  Negative for abdominal pain and constipation.  Genitourinary:  Positive for frequency. Negative for dysuria and urgency.  Musculoskeletal:  Positive for arthralgias, back pain and gait problem.       Left knee pain is chronic.   Skin:  Negative for color change.  Neurological:  Negative for speech difficulty and weakness.  Psychiatric/Behavioral:  Negative for confusion and sleep disturbance. The patient is not nervous/anxious.     Immunization History  Administered Date(s) Administered   Fluad Quad(high Dose 65+) 06/13/2019, 07/02/2022   Influenza Split 06/18/2012   Influenza Whole 07/11/2010   Influenza, High Dose Seasonal PF 07/07/2013,  07/14/2014, 06/24/2015, 06/13/2016, 06/04/2017, 06/17/2018   Influenza-Unspecified 06/22/2020, 06/29/2021, 06/14/2023   Moderna Covid-19 Vaccine Bivalent Booster 63yrs & up 01/26/2022   Moderna SARS-COV2 Booster Vaccination 07/19/2020, 02/07/2021, 06/26/2023, 06/26/2023   Moderna Sars-Covid-2 Vaccination 09/14/2019, 10/12/2019   PFIZER(Purple Top)SARS-COV-2 Vaccination 05/30/2021   Pfizer Covid-19 Vaccine Bivalent Booster 32yrs & up 07/12/2022   Pneumococcal Conjugate-13 04/27/2014, 08/22/2021   Pneumococcal Polysaccharide-23 10/15/2017   Td 09/10/2005   Zoster Recombinant(Shingrix) 08/22/2021, 10/25/2021, 02/28/2022   Zoster, Live 11/29/2006   Pertinent  Health Maintenance Due  Topic Date Due   INFLUENZA VACCINE  Completed   DEXA SCAN  Completed      12/14/2020    5:00 AM 12/14/2020    8:00 AM 12/14/2020    8:29 PM 12/15/2020    8:44 AM 10/02/2022    9:39 AM  Fall Risk  Falls in the past year?     0  Was there an injury with Fall?     0  Fall Risk Category Calculator     0  (RETIRED) Patient Fall Risk Level Moderate fall risk Moderate fall risk Moderate fall risk High fall risk   Patient at Risk for Falls Due to     No Fall Risks  Fall risk Follow up     Falls evaluation completed   Functional Status Survey:    Vitals:   11/06/23 1426  BP: (!) 140/80  Pulse: 68  Resp: 18  Temp: 98 F (36.7 C)  SpO2: 94%  Weight: 169 lb (76.7 kg)   Body mass index is 29.94 kg/m. Physical Exam Vitals and nursing note reviewed.  Constitutional:      Appearance: Normal appearance.  HENT:     Head: Normocephalic and atraumatic.     Nose: Nose normal.     Mouth/Throat:     Mouth: Mucous membranes are moist.  Eyes:     Extraocular Movements: Extraocular movements intact.     Conjunctiva/sclera: Conjunctivae normal.     Pupils: Pupils are equal, round, and reactive to light.  Cardiovascular:     Rate and Rhythm: Normal rate and regular rhythm.     Heart sounds: No murmur  heard. Pulmonary:     Effort: Pulmonary effort is normal.     Breath sounds: Rales present.     Comments: Posterior left base Abdominal:     General: Bowel sounds are normal.     Palpations: Abdomen is soft.     Tenderness: There is no abdominal tenderness.     Comments: Colostomy  Musculoskeletal:        General: No tenderness.     Cervical back: Normal range of motion and neck supple.     Right lower leg: Edema present.     Left lower leg: Edema present.     Comments: mid to lower back pain is better, only positional.  LLE edema 1+,  RLE trace   Skin:    General: Skin is warm and dry.     Findings: Erythema present.     Comments: Mid abd surgical scar. Colostomy is functioning.  Chronic venous insufficiency skin changes BLE, much warmer LLE>RLE, no tenderness in calves palpated or with dorsiflexion of the R and L foot.   Neurological:     General: No focal deficit present.     Mental Status: She is alert and oriented to person, place, and time. Mental status is at baseline.     Gait: Gait abnormal.  Psychiatric:        Mood and Affect: Mood normal.        Behavior: Behavior normal.        Thought Content: Thought content normal.        Judgment: Judgment normal.     Labs reviewed: Recent Labs    05/30/23 0000 06/21/23 0000  NA 138 134*  K 3.8 3.9  CL 103 99  CO2 25* 26*  BUN 10 15  CREATININE 0.6 0.7  CALCIUM 9.4 9.2   Recent Labs    05/30/23 0000 06/25/23 0000  AST 16 17  ALT 13 15  ALKPHOS 72 71  ALBUMIN 3.9 3.7   Recent Labs    05/30/23 0000 06/21/23 0000  WBC 5.4 6.0  NEUTROABS 3,434.00 3,630.00  HGB 12.9 13.7  HCT 39 43  PLT 125* 135*   Lab Results  Component Value Date   TSH 1.43 05/30/2023   Lab Results  Component Value Date   HGBA1C 5.9 (H) 09/26/2015   Lab Results  Component Value Date   CHOL 128 05/30/2023   HDL 51 05/30/2023   LDLCALC 58 05/30/2023   LDLDIRECT 147.0 11/17/2012   TRIG 106 05/30/2023   CHOLHDL 3 02/10/2020     Significant Diagnostic Results in last 30 days:  No results found.  Assessment/Plan: PND (paroxysmal nocturnal dyspnea) persisted c/o SOB when lying down, weight has been stable in the past 3 months, pending echocardiogram  11/04/23 EKG SR, RBBB, CXR no acute cardiopulmonary disease. 11/05/23 BNP 249 Denied cough or phlegm production, ? Palpitation, no O2 desaturation, noted rales LLL Increase Furosemide 40mg , Kcl every day, f/u BMP in  am Referral to Cardiology for further evaluation.   Venous stasis BLE edema, L>R, venous US negative DVT, occasional pain in legs. 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   Labs/tests ordered:  BMP

## 2023-11-06 NOTE — Assessment & Plan Note (Signed)
 BLE edema, L>R, venous US negative DVT, occasional pain in legs. Declined ED evaluation, CT of abd, desires staff [facility] to call vascular specialist appointment sooner than 12/05/23

## 2023-11-06 NOTE — Assessment & Plan Note (Signed)
 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

## 2023-11-06 NOTE — Assessment & Plan Note (Signed)
 off Fe, Hgb 11.1 11/05/23

## 2023-11-06 NOTE — Assessment & Plan Note (Signed)
 persisted c/o SOB when lying down, weight has been stable in the past 3 months, pending echocardiogram  11/04/23 EKG SR, RBBB, CXR no acute cardiopulmonary disease. 11/05/23 BNP 249 Denied cough or phlegm production, ? Palpitation, no O2 desaturation, noted rales LLL Increase Furosemide 40mg , Kcl every day, f/u BMP in am Referral to Cardiology for further evaluation.

## 2023-11-06 NOTE — Assessment & Plan Note (Signed)
 increased Kcl to every day 11/06/23,  serum K 3.2 11/05/23

## 2023-11-07 LAB — COMPREHENSIVE METABOLIC PANEL
Calcium: 9.3 (ref 8.7–10.7)
eGFR: 69

## 2023-11-07 LAB — BASIC METABOLIC PANEL
BUN: 18 (ref 4–21)
CO2: 28 — AB (ref 13–22)
Chloride: 104 (ref 99–108)
Creatinine: 0.8 (ref 0.5–1.1)
Glucose: 96
Potassium: 3.4 meq/L — AB (ref 3.5–5.1)
Sodium: 140 (ref 137–147)

## 2023-11-08 ENCOUNTER — Encounter: Payer: Self-pay | Admitting: Sports Medicine

## 2023-11-08 ENCOUNTER — Non-Acute Institutional Stay: Payer: Self-pay | Admitting: Sports Medicine

## 2023-11-08 DIAGNOSIS — R0602 Shortness of breath: Secondary | ICD-10-CM

## 2023-11-08 DIAGNOSIS — M7989 Other specified soft tissue disorders: Secondary | ICD-10-CM | POA: Diagnosis not present

## 2023-11-08 DIAGNOSIS — E876 Hypokalemia: Secondary | ICD-10-CM | POA: Diagnosis not present

## 2023-11-08 NOTE — Progress Notes (Unsigned)
Location:  [facility] Room Number: [id number] Place of Service:  ALF [phone]) Provider:  Venita Sheffield, MD  Patient Care Team: Venita Sheffield, MD as PCP - General (Internal Medicine) Verner Chol, [facility] (Inactive) as Pharmacist (Pharmacist)  Extended Emergency Contact Information Primary Emergency Contact: Julio Sicks [place] Phone: [phone] Relation: Daughter  Code Status:  DNR Goals of care: Advanced Directive information    11/08/2023    3:33 PM  Advanced Directives  Does Patient Have a Medical Advance Directive? Yes  Type of Estate agent of [place];Living will  Does patient want to make changes to medical advance directive? No - Patient declined  Copy of Healthcare Power of Attorney in Chart? Yes - validated most recent copy scanned in chart (See row information)     Chief Complaint  Patient presents with   Acute Visit    hypokalemia    HPI:  Pt is a 89 y.o. female seen today for an acute visit for    Past Medical History:  Diagnosis Date   Arthritis    COVID    Depression    Hyperlipidemia    Osteopenia    Stroke (HCC)    Thrombocytopenia (HCC)    Vitamin D deficiency    Wrist fracture 09/10/2000   left   Past Surgical History:  Procedure Laterality Date   APPENDECTOMY  1967   CARPAL TUNNEL RELEASE  2008   right   COLON RESECTION SIGMOID N/A 12/03/2020   Procedure: EXPLORATORY LAPAROTOMY; HARTMAN'S PROCEDURE; SPLEENIC FLEXURE MOBILIZATION;  Surgeon: Romie Levee, MD;  Location: [facility];  Service: General;  Laterality: N/A;   COLOSTOMY N/A 12/03/2020   Procedure: COLOSTOMY;  Surgeon: Romie Levee, MD;  Location: [facility];  Service: General;  Laterality: N/A;   JOINT REPLACEMENT  2002   right total     Allergies  Allergen Reactions   Bactrim [Sulfamethoxazole-Trimethoprim] Nausea Only   Ditropan [Oxybutynin]     Made patient feel bad   Sulfa Antibiotics      Other reaction(s): stomach upset    Outpatient Encounter Medications as of 11/08/2023  Medication Sig   acetaminophen (TYLENOL) 325 MG tablet Take 650 mg by mouth 3 (three) times daily. SCHEDULED, See as needed dosing also   Alum & Mag Hydroxide-Simeth (ANTACID & ANTIGAS PO) Take 30 mLs by mouth every 4 (four) hours as needed. for Indigestion   aspirin EC 81 MG tablet Take 81 mg by mouth daily. Swallow whole.   calcium carbonate (OSCAL) 1500 (600 Ca) MG TABS tablet Take 600 mg of elemental calcium by mouth daily.   Cholecalciferol (VITAMIN D3) 2000 units capsule Take 2,000 Units by mouth daily.   fluticasone (FLONASE) 50 MCG/ACT nasal spray Place 1 spray into both nostrils 2 (two) times daily.   furosemide (LASIX) 20 MG tablet Take 40 mg by mouth daily.   loperamide (IMODIUM A-D) 2 MG tablet Take 2 mg by mouth as needed for diarrhea or loose stools.   Magnesium 300 MG CAPS Take 300 mg by mouth daily.   magnesium hydroxide (MILK OF MAGNESIA) 400 MG/5ML suspension Take 30 mLs by mouth daily as needed for mild constipation.   ondansetron (ZOFRAN) 4 MG tablet Take 4 mg by mouth every 6 (six) hours as needed for vomiting or nausea.   pantoprazole (PROTONIX) 40 MG tablet Take 40 mg by mouth daily.   polyethylene glycol (MIRALAX / GLYCOLAX) 17 g packet Take 17 g by mouth 3 (three) times  daily.   potassium chloride SA (KLOR-CON) 20 MEQ tablet Take 20 mEq by mouth daily.   Propylene Glycol (SYSTANE BALANCE OP) Apply 1 drop to eye every evening. related to PRESENCE OF INTRAOCULAR LENS (Z96.1) wait 10 minutes between Systane and timolol drops   rosuvastatin (CRESTOR) 10 MG tablet Take 10 mg by mouth at bedtime.   Sennosides (SENNA) 8.6 MG CAPS Take one to two capsules as needed at bedtime If no bowel movements in 3 days   simethicone (MYLICON) 80 MG chewable tablet Chew 1 tablet (80 mg total) by mouth 4 (four) times daily.   timolol (TIMOPTIC) 0.5 % ophthalmic solution Place 1 drop into the left eye 2  (two) times daily. related to PRESENCE OF INTRAOCULAR LENS (Z96.1) Wait 10 minutes between Systane and timolol drops   venlafaxine XR (EFFEXOR-XR) 75 MG 24 hr capsule TAKE 2 CAPSULES (150MG ) BY MOUTH DAILY.   vitamin B-12 (CYANOCOBALAMIN) 1000 MCG tablet Take 1,000 mcg by mouth daily.   amLODipine (NORVASC) 5 MG tablet Take 5 mg by mouth daily. (Patient not taking: Reported on 11/08/2023)   diclofenac Sodium (VOLTAREN ARTHRITIS PAIN) 1 % GEL Apply 2 g topically 3 (three) times daily as needed (Left wrist). (Patient not taking: Reported on 11/08/2023)   LORazepam (ATIVAN) 0.5 MG tablet Take 0.5 mg by mouth every 8 (eight) hours as needed for anxiety.   minoxidil (ROGAINE) 2 % external solution Apply topically daily as needed. (Patient not taking: Reported on 11/08/2023)   No facility-administered encounter medications on file as of 11/08/2023.    Review of Systems  Immunization History  Administered Date(s) Administered   Fluad Quad(high Dose 65+) 06/13/2019, 07/02/2022   Influenza Split 06/18/2012   Influenza Whole 07/11/2010   Influenza, High Dose Seasonal PF 07/07/2013, 07/14/2014, 06/24/2015, 06/13/2016, 06/04/2017, 06/17/2018   Influenza-Unspecified 06/22/2020, 06/29/2021, 06/14/2023   Moderna Covid-19 Vaccine Bivalent Booster 60yrs & up 01/26/2022   Moderna SARS-COV2 Booster Vaccination 07/19/2020, 02/07/2021, 06/26/2023, 06/26/2023   Moderna Sars-Covid-2 Vaccination 09/14/2019, 10/12/2019   PFIZER(Purple Top)SARS-COV-2 Vaccination 05/30/2021   Pfizer Covid-19 Vaccine Bivalent Booster 12yrs & up 07/12/2022   Pneumococcal Conjugate-13 04/27/2014, 08/22/2021   Pneumococcal Polysaccharide-23 10/15/2017   Td 09/10/2005   Zoster Recombinant(Shingrix) 08/22/2021, 10/25/2021, 02/28/2022   Zoster, Live 11/29/2006   Pertinent  Health Maintenance Due  Topic Date Due   INFLUENZA VACCINE  Completed   DEXA SCAN  Completed      12/14/2020    5:00 AM 12/14/2020    8:00 AM 12/14/2020    8:29 PM  12/15/2020    8:44 AM 10/02/2022    9:39 AM  Fall Risk  Falls in the past year?     0  Was there an injury with Fall?     0  Fall Risk Category Calculator     0  (RETIRED) Patient Fall Risk Level Moderate fall risk Moderate fall risk Moderate fall risk High fall risk   Patient at Risk for Falls Due to     No Fall Risks  Fall risk Follow up     Falls evaluation completed   Functional Status Survey:    Vitals:   11/08/23 1524  BP: 115/83  Pulse: 72  Resp: 19  Temp: 97.6 F (36.4 C)  SpO2: 97%  Weight: 169 lb (76.7 kg)  Height: 5\' 3"  (1.6 m)   Body mass index is 29.94 kg/m. Physical Exam  Labs reviewed: Recent Labs    05/30/23 0000 06/21/23 0000  NA 138 134*  K 3.8  3.9  CL 103 99  CO2 25* 26*  BUN 10 15  CREATININE 0.6 0.7  CALCIUM 9.4 9.2   Recent Labs    05/30/23 0000 06/25/23 0000  AST 16 17  ALT 13 15  ALKPHOS 72 71  ALBUMIN 3.9 3.7   Recent Labs    05/30/23 0000 06/21/23 0000  WBC 5.4 6.0  NEUTROABS 3,434.00 3,630.00  HGB 12.9 13.7  HCT 39 43  PLT 125* 135*   Lab Results  Component Value Date   TSH 1.43 05/30/2023   Lab Results  Component Value Date   HGBA1C 5.9 (H) 09/26/2015   Lab Results  Component Value Date   CHOL 128 05/30/2023   HDL 51 05/30/2023   LDLCALC 58 05/30/2023   LDLDIRECT 147.0 11/17/2012   TRIG 106 05/30/2023   CHOLHDL 3 02/10/2020    Significant Diagnostic Results in last 30 days:  No results found.  Assessment/Plan There are no diagnoses linked to this encounter.   Family/ staff Communication: ***  Labs/tests ordered:  ***

## 2023-11-08 NOTE — Progress Notes (Signed)
 Provider:  Dr. Venita Sheffield Location:  [facility] Place of Service:  ALF (13)  PCP: Venita Sheffield, MD Patient Care Team: Venita Sheffield, MD as PCP - General (Internal Medicine) Verner Chol, [facility] (Inactive) as Pharmacist (Pharmacist)  Extended Emergency Contact Information Primary Emergency Contact: Julio Sicks [place] of [place] Phone: [phone] Relation: Daughter  Goals of Care: Advanced Directive information    07/12/2023    3:37 PM  Advanced Directives  Does Patient Have a Medical Advance Directive? Yes  Type of Estate agent of [place];Living will  Does patient want to make changes to medical advance directive? No - Patient declined  Copy of Healthcare Power of Attorney in Chart? Yes - validated most recent copy scanned in chart (See row information)      Chief Complaint  Patient presents with   Acute Visit    hypokalemia       History of Present Illness        89 yr old F with h/o   Bil lower extremity odema, OAB, colostomy , HTN, CVA is seen for acute visit for  loose stools.  Pt seen and examined in her room  Pt reports that she had 1 episode of loose stools Denies blood or dark stools She is able to tolerate po food however reports that she did not had a good appetite today  Denies abdominal pain  Her potassium is low 3.4  Chronic SOB  Pt c/o chronic exertional SOB She sleeps with  head elevated  Denies chest pain , palpitations She walks to dining room with her walker  Reports no change in her breathing    Left leg swelling Pt with swelling in her left leg  Redness +  Pt had u/s in the facility, 1st u/s showed possible dvt and subsequent U/S r/o DVT     Past Medical History:  Diagnosis Date   Arthritis    COVID    Depression    Hyperlipidemia    Osteopenia    Stroke (HCC)    Thrombocytopenia (HCC)    Vitamin D deficiency    Wrist fracture 09/10/2000   left    Past Surgical History:  Procedure Laterality Date   APPENDECTOMY  1967   CARPAL TUNNEL RELEASE  2008   right   COLON RESECTION SIGMOID N/A 12/03/2020   Procedure: EXPLORATORY LAPAROTOMY; HARTMAN'S PROCEDURE; SPLEENIC FLEXURE MOBILIZATION;  Surgeon: Romie Levee, MD;  Location: [facility] ORS;  Service: General;  Laterality: N/A;   COLOSTOMY N/A 12/03/2020   Procedure: COLOSTOMY;  Surgeon: Romie Levee, MD;  Location: [facility];  Service: General;  Laterality: N/A;   JOINT REPLACEMENT  2002   right total     reports that she has never smoked. She has never used smokeless tobacco. She reports that she does not drink alcohol and does not use drugs. Social History   Socioeconomic History   Marital status: Widowed    Spouse name: Not on file   Number of children: 4   Years of education: 16   Highest education level: Bachelor's degree (e.g., BA, AB, BS)  Occupational History   Not on file  Tobacco Use   Smoking status: Never   Smokeless tobacco: Never  Vaping Use   Vaping status: Never Used  Substance and Sexual Activity   Alcohol use: No   Drug use: No   Sexual activity: Not on file  Other Topics Concern   Not on file  Social History Narrative  Lives in senior apartment at [facility]   widowed   Social Drivers of Health   Financial Resource Strain: Low Risk  (10/16/2019)   Overall Financial Resource Strain (CARDIA)    Difficulty of Paying Living Expenses: Not hard at all  Food Insecurity: No Food Insecurity (10/16/2019)   Hunger Vital Sign    Worried About Running Out of Food in the Last Year: Never true    Ran Out of Food in the Last Year: Never true  Transportation Needs: No Transportation Needs (10/16/2019)   PRAPARE - Administrator, Civil Service (Medical): No    Lack of Transportation (Non-Medical): No  Physical Activity: Inactive (10/16/2019)   Exercise Vital Sign    Days of Exercise per Week: 0 days    Minutes of Exercise per Session: 0 min  Stress: No  Stress Concern Present (10/16/2019)   [employer] of Occupational Health - Occupational Stress Questionnaire    Feeling of Stress : Only a little  Social Connections: Unknown (10/16/2019)   Social Connection and Isolation Panel [NHANES]    Frequency of Communication with Friends and Family: More than three times a week    Frequency of Social Gatherings with Friends and Family: Once a week    Attends Religious Services: Not on Marketing executive or Organizations: Yes    Attends Banker Meetings: Not on file    Marital Status: Widowed  Catering manager Violence: Not on file    Functional Status Survey:    Family History  Problem Relation Age of Onset   Breast cancer Mother    Breast cancer Sister    Other Son        unsure of cause   Arthritis Son    Ovarian cancer Daughter    Crohn's disease Daughter    Colon cancer Neg Hx    Esophageal cancer Neg Hx     Health Maintenance  Topic Date Due   COVID-19 Vaccine (6 - 2024-25 season) 08/21/2023   Medicare Annual Wellness (AWV)  09/01/2024   Pneumonia Vaccine 55+ Years old  Completed   INFLUENZA VACCINE  Completed   DEXA SCAN  Completed   Zoster Vaccines- Shingrix  Completed   HPV VACCINES  Aged Out   DTaP/Tdap/Td  Discontinued    Allergies  Allergen Reactions   Bactrim [Sulfamethoxazole-Trimethoprim] Nausea Only   Ditropan [Oxybutynin]     Made patient feel bad   Sulfa Antibiotics     Other reaction(s): stomach upset    Outpatient Encounter Medications as of 11/08/2023  Medication Sig   acetaminophen (TYLENOL) 325 MG tablet Take 650 mg by mouth 3 (three) times daily. SCHEDULED, See as needed dosing also   Alum & Mag Hydroxide-Simeth (ANTACID & ANTIGAS PO) Take 30 mLs by mouth every 4 (four) hours as needed. for Indigestion   amLODipine (NORVASC) 5 MG tablet Take 5 mg by mouth daily.   aspirin EC 81 MG tablet Take 81 mg by mouth daily. Swallow whole.   calcium carbonate (OSCAL) 1500 (600 Ca)  MG TABS tablet Take 600 mg of elemental calcium by mouth daily.   Cholecalciferol (VITAMIN D3) 2000 units capsule Take 2,000 Units by mouth daily.   diclofenac Sodium (VOLTAREN ARTHRITIS PAIN) 1 % GEL Apply 2 g topically 3 (three) times daily as needed (Left wrist).   fluticasone (FLONASE) 50 MCG/ACT nasal spray Place 1 spray into both nostrils 2 (two) times daily.   loperamide (IMODIUM A-D) 2  MG tablet Take 2 mg by mouth as needed for diarrhea or loose stools.   LORazepam (ATIVAN) 0.5 MG tablet Take 0.5 mg by mouth every 8 (eight) hours as needed for anxiety.   Magnesium 300 MG CAPS Take 300 mg by mouth daily.   magnesium hydroxide (MILK OF MAGNESIA) 400 MG/5ML suspension Take 30 mLs by mouth daily as needed for mild constipation.   minoxidil (ROGAINE) 2 % external solution Apply topically daily as needed.   pantoprazole (PROTONIX) 40 MG tablet Take 40 mg by mouth daily.   polyethylene glycol (MIRALAX / GLYCOLAX) 17 g packet Take 17 g by mouth 3 (three) times daily.   potassium chloride SA (KLOR-CON) 20 MEQ tablet Take 20 mEq by mouth daily.   Propylene Glycol (SYSTANE BALANCE OP) Apply 1 drop to eye every evening. related to PRESENCE OF INTRAOCULAR LENS (Z96.1) wait 10 minutes between Systane and timolol drops   rosuvastatin (CRESTOR) 10 MG tablet Take 10 mg by mouth at bedtime.   Sennosides (SENNA) 8.6 MG CAPS Take one to two capsules as needed at bedtime If no bowel movements in 3 days   simethicone (MYLICON) 80 MG chewable tablet Chew 1 tablet (80 mg total) by mouth 4 (four) times daily.   timolol (TIMOPTIC) 0.5 % ophthalmic solution Place 1 drop into the left eye 2 (two) times daily. related to PRESENCE OF INTRAOCULAR LENS (Z96.1) Wait 10 minutes between Systane and timolol drops   venlafaxine XR (EFFEXOR-XR) 75 MG 24 hr capsule TAKE 2 CAPSULES (150MG ) BY MOUTH DAILY.   vitamin B-12 (CYANOCOBALAMIN) 1000 MCG tablet Take 1,000 mcg by mouth daily.   No facility-administered encounter  medications on file as of 11/08/2023.    Review of Systems  Constitutional:  Negative for fever.  HENT:  Negative for sore throat.   Respiratory:  Positive for shortness of breath (CHRONIC). Negative for cough and wheezing.   Cardiovascular:  Positive for leg swelling. Negative for chest pain and palpitations.  Gastrointestinal:  Positive for diarrhea. Negative for abdominal distention, abdominal pain, blood in stool, constipation, nausea and vomiting.  Genitourinary:  Negative for dysuria, frequency and urgency.  Neurological:  Negative for dizziness.   Negative unless indicated in HPI.  Vitals:   11/08/23 1524  BP: 115/83  Pulse: 72  Resp: 19  Temp: 97.6 F (36.4 C)  SpO2: 97%  Weight: 169 lb (76.7 kg)  Height: 5\' 3"  (1.6 m)   Body mass index is 29.94 kg/m. BP Readings from Last 3 Encounters:  11/08/23 115/83  11/06/23 (!) 140/80  11/04/23 (!) 140/80   Wt Readings from Last 3 Encounters:  11/08/23 169 lb (76.7 kg)  11/06/23 169 lb (76.7 kg)  11/04/23 169 lb (76.7 kg)   Physical Exam Constitutional:      Appearance: Normal appearance.  Cardiovascular:     Rate and Rhythm: Normal rate and regular rhythm.  Pulmonary:     Effort: Pulmonary effort is normal. No respiratory distress.     Breath sounds: Normal breath sounds. No wheezing.  Abdominal:     General: Bowel sounds are normal. There is no distension.     Tenderness: There is no abdominal tenderness. There is no guarding or rebound.     Comments:    Musculoskeletal:        General: Swelling present. No tenderness.     Comments: Left leg redness, induration noted in calf  Neurological:     Mental Status: She is alert. Mental status is at baseline.  Sensory: No sensory deficit.     Motor: No weakness.     Labs reviewed: Basic Metabolic Panel: Recent Labs    05/30/23 0000 06/21/23 0000  NA 138 134*  K 3.8 3.9  CL 103 99  CO2 25* 26*  BUN 10 15  CREATININE 0.6 0.7  CALCIUM 9.4 9.2   Liver  Function Tests: Recent Labs    05/30/23 0000 06/25/23 0000  AST 16 17  ALT 13 15  ALKPHOS 72 71  ALBUMIN 3.9 3.7   No results for input(s): "LIPASE", "AMYLASE" in the last 8760 hours. No results for input(s): "AMMONIA" in the last 8760 hours. CBC: Recent Labs    05/30/23 0000 06/21/23 0000  WBC 5.4 6.0  NEUTROABS 3,434.00 3,630.00  HGB 12.9 13.7  HCT 39 43  PLT 125* 135*   Cardiac Enzymes: No results for input(s): "CKTOTAL", "CKMB", "CKMBINDEX", "TROPONINI" in the last 8760 hours. BNP: Invalid input(s): "POCBNP" Lab Results  Component Value Date   HGBA1C 5.9 (H) 09/26/2015   Lab Results  Component Value Date   TSH 1.43 05/30/2023   Lab Results  Component Value Date   VITAMINB12 942 12/21/2020   No results found for: "FOLATE" Lab Results  Component Value Date   IRON 22 (L) 04/22/2015   TIBC 402 04/22/2015   FERRITIN 23 04/22/2015    Imaging and Procedures obtained prior to SNF admission: MM 3D SCREENING MAMMOGRAM BILATERAL BREAST Result Date: 10/07/2023 CLINICAL DATA:  Screening. EXAM: DIGITAL SCREENING BILATERAL MAMMOGRAM WITH TOMOSYNTHESIS AND CAD TECHNIQUE: Bilateral screening digital craniocaudal and mediolateral oblique mammograms were obtained. Bilateral screening digital breast tomosynthesis was performed. The images were evaluated with computer-aided detection. COMPARISON:  Previous exam(s). ACR Breast Density Category b: There are scattered areas of fibroglandular density. FINDINGS: There are no findings suspicious for malignancy. IMPRESSION: No mammographic evidence of malignancy. A result letter of this screening mammogram will be mailed directly to the patient. RECOMMENDATION: Screening mammogram in one year. (Code:SM-B-01Y) BI-RADS CATEGORY  1: Negative. Electronically Signed   By: Annia Belt M.D.   On: 10/07/2023 16:08    Assessment and Plan      1. Swelling of lower extremity (Primary) Pt had u/s in the facility which showed no DVT  Her leg is  erythematous,calf is indurated Will order u/s at outside facility - US Venous Img Lower Unilateral Left (DVT); Future  2. SOB (shortness of breath) on exertion Basal crackles Able to speak in full sentences, does not appear to be in distress She is saturating 97% on RA  No tachycardia  Pt declined ED evaluation  Will monitor vitals   3. Hypokalemia Give additional 1 time dose  Will check K, magnesium levels  Other orders - furosemide (LASIX) 20 MG tablet; Take 40 mg by mouth daily. - ondansetron (ZOFRAN) 4 MG tablet; Take 4 mg by mouth every 6 (six) hours as needed for vomiting or nausea.      40 min Total time spent for obtaining history,  performing a medically appropriate examination and evaluation, reviewing the tests,ordering  tests,  documenting clinical information in the electronic or other health record,  ,care coordination (not separately reported)       40 min Total time spent for obtaining history,  performing a medically appropriate examination and evaluation, reviewing the tests,ordering  tests,  documenting clinical information in the electronic or other health record,   ,care coordination (not separately reported)

## 2023-11-11 ENCOUNTER — Telehealth: Payer: Self-pay | Admitting: *Deleted

## 2023-11-11 NOTE — Telephone Encounter (Signed)
 Janella with [facility] called and stated that an order was placed for a CT Angio but it was not filled out all the way on the order that was placed through [facility] and it needs to be filled out before they can perform the test.   [facility] Would like for the Provider to finish the order before they can schedule.

## 2023-11-11 NOTE — Telephone Encounter (Signed)
 Sara Sheffield, MD  You; Psc Clinical1 hour ago (12:56 PM)    Will cancel CT A chest But need u/s lower leg to r/o dvt       [facility] notified and agreed and they stated that patient is scheduled for the Ultrasound.

## 2023-11-13 ENCOUNTER — Ambulatory Visit
Admission: RE | Admit: 2023-11-13 | Discharge: 2023-11-13 | Disposition: A | Discharge status: Home / Self Care | Payer: Medicare Other | Source: Ambulatory Visit | Attending: Sports Medicine

## 2023-11-13 DIAGNOSIS — M7989 Other specified soft tissue disorders: Secondary | ICD-10-CM

## 2023-11-20 ENCOUNTER — Non-Acute Institutional Stay: Payer: Self-pay | Admitting: Adult Health

## 2023-11-20 ENCOUNTER — Encounter: Payer: Self-pay | Admitting: Adult Health

## 2023-11-20 DIAGNOSIS — E876 Hypokalemia: Secondary | ICD-10-CM | POA: Diagnosis not present

## 2023-11-20 DIAGNOSIS — G2581 Restless legs syndrome: Secondary | ICD-10-CM

## 2023-11-20 NOTE — Progress Notes (Signed)
 Location:  [facility] Room Number: AL [id number]-A Place of Service:  ALF [phone]) Provider:  Medina-Vargas,Seamus Warehime C,NP  Venita Sheffield, MD  Patient Care Team: Venita Sheffield, MD as PCP - General (Internal Medicine) Verner Chol, [facility] (Inactive) as Pharmacist (Pharmacist)  Extended Emergency Contact Information Primary Emergency Contact: Julio Sicks [place] of [place] Phone: [phone] Relation: Daughter  Code Status:  DNR Goals of care: Advanced Directive information    11/20/2023   11:48 AM  Advanced Directives  Does Patient Have a Medical Advance Directive? Yes  Type of Estate agent of [place];Living will  Does patient want to make changes to medical advance directive? No - Patient declined  Copy of Healthcare Power of Attorney in Chart? Yes - validated most recent copy scanned in chart (See row information)     Chief Complaint  Patient presents with   Medical Management of Chronic Issues    Requesting medication adjustment and restless leg syndrome.    HPI:  Pt is a 89 y.o. female seen today for an acute visit for medication adjustment and restless leg syndrome.  She is a resident of [facility].  She is currently taking KCl ER 40 mEq daily for hypokalemia.  Latest K3.4, 11/07/2023 and K3.6, 10/01/23.  She stated she was previously diagnosed with RLS.  She is currently not taking any medication for it.  She stated that her legs has been bothering her. She has episodes of RLS at night which disturbs her sleep.    Past Medical History:  Diagnosis Date   Arthritis    COVID    Depression    Hyperlipidemia    Osteopenia    Stroke (HCC)    Thrombocytopenia (HCC)    Vitamin D deficiency    Wrist fracture 09/10/2000   left   Past Surgical History:  Procedure Laterality Date   APPENDECTOMY  1967   CARPAL TUNNEL RELEASE  2008   right   COLON RESECTION SIGMOID N/A 12/03/2020    Procedure: EXPLORATORY LAPAROTOMY; HARTMAN'S PROCEDURE; SPLEENIC FLEXURE MOBILIZATION;  Surgeon: Romie Levee, MD;  Location: [facility];  Service: General;  Laterality: N/A;   COLOSTOMY N/A 12/03/2020   Procedure: COLOSTOMY;  Surgeon: Romie Levee, MD;  Location: [facility];  Service: General;  Laterality: N/A;   JOINT REPLACEMENT  2002   right total     Allergies  Allergen Reactions   Bactrim [Sulfamethoxazole-Trimethoprim] Nausea Only   Ditropan [Oxybutynin]     Made patient feel bad   Sulfa Antibiotics     Other reaction(s): stomach upset    Outpatient Encounter Medications as of 11/20/2023  Medication Sig   acetaminophen (TYLENOL) 325 MG tablet Take 650 mg by mouth 3 (three) times daily. SCHEDULED, See as needed dosing also   Alum & Mag Hydroxide-Simeth (ANTACID & ANTIGAS PO) Take 30 mLs by mouth every 4 (four) hours as needed. for Indigestion   aspirin EC 81 MG tablet Take 81 mg by mouth daily. Swallow whole.   calcium carbonate (OSCAL) 1500 (600 Ca) MG TABS tablet Take 600 mg of elemental calcium by mouth daily.   Cholecalciferol (VITAMIN D3) 2000 units capsule Take 2,000 Units by mouth daily.   fluticasone (FLONASE) 50 MCG/ACT nasal spray Place 1 spray into both nostrils 2 (two) times daily.   furosemide (LASIX) 20 MG tablet Take 40 mg by mouth daily.   loperamide (IMODIUM A-D) 2 MG tablet Take 2 mg by mouth as needed for diarrhea or loose  stools.   Magnesium 300 MG CAPS Take 300 mg by mouth daily.   magnesium hydroxide (MILK OF MAGNESIA) 400 MG/5ML suspension Take 30 mLs by mouth daily as needed for mild constipation.   metoprolol tartrate (LOPRESSOR) 25 MG tablet Take 25 mg by mouth 2 (two) times daily.   ondansetron (ZOFRAN) 4 MG tablet Take 4 mg by mouth every 6 (six) hours as needed for vomiting or nausea.   pantoprazole (PROTONIX) 40 MG tablet Take 40 mg by mouth daily.   polyethylene glycol (MIRALAX / GLYCOLAX) 17 g packet Take 17 g by mouth 3 (three) times daily.   potassium  chloride SA (KLOR-CON) 20 MEQ tablet Take 20 mEq by mouth daily.   Propylene Glycol (SYSTANE BALANCE OP) Apply 1 drop to eye every evening. related to PRESENCE OF INTRAOCULAR LENS (Z96.1) wait 10 minutes between Systane and timolol drops   rosuvastatin (CRESTOR) 10 MG tablet Take 10 mg by mouth at bedtime.   Sennosides (SENNA) 8.6 MG CAPS Take one to two capsules as needed at bedtime If no bowel movements in 3 days   simethicone (MYLICON) 80 MG chewable tablet Chew 1 tablet (80 mg total) by mouth 4 (four) times daily.   timolol (TIMOPTIC) 0.5 % ophthalmic solution Place 1 drop into the left eye 2 (two) times daily. related to PRESENCE OF INTRAOCULAR LENS (Z96.1) Wait 10 minutes between Systane and timolol drops   venlafaxine XR (EFFEXOR-XR) 75 MG 24 hr capsule TAKE 2 CAPSULES (150MG ) BY MOUTH DAILY.   vitamin B-12 (CYANOCOBALAMIN) 1000 MCG tablet Take 1,000 mcg by mouth daily.   amLODipine (NORVASC) 5 MG tablet Take 5 mg by mouth daily. (Patient not taking: Reported on 11/08/2023)   diclofenac Sodium (VOLTAREN ARTHRITIS PAIN) 1 % GEL Apply 2 g topically 3 (three) times daily as needed (Left wrist). (Patient not taking: Reported on 11/20/2023)   LORazepam (ATIVAN) 0.5 MG tablet Take 0.5 mg by mouth every 8 (eight) hours as needed for anxiety. (Patient not taking: Reported on 11/20/2023)   minoxidil (ROGAINE) 2 % external solution Apply topically daily as needed. (Patient not taking: Reported on 11/20/2023)   No facility-administered encounter medications on file as of 11/20/2023.    Review of Systems  Immunization History  Administered Date(s) Administered   Fluad Quad(high Dose 65+) 06/13/2019, 07/02/2022   Influenza Split 06/18/2012   Influenza Whole 07/11/2010   Influenza, High Dose Seasonal PF 07/07/2013, 07/14/2014, 06/24/2015, 06/13/2016, 06/04/2017, 06/17/2018   Influenza-Unspecified 06/22/2020, 06/29/2021, 06/14/2023   Moderna Covid-19 Vaccine Bivalent Booster 97yrs & up 01/26/2022    Moderna SARS-COV2 Booster Vaccination 07/19/2020, 02/07/2021, 06/26/2023, 06/26/2023   Moderna Sars-Covid-2 Vaccination 09/14/2019, 10/12/2019   PFIZER(Purple Top)SARS-COV-2 Vaccination 05/30/2021   Pfizer Covid-19 Vaccine Bivalent Booster 17yrs & up 07/12/2022   Pneumococcal Conjugate-13 04/27/2014, 08/22/2021   Pneumococcal Polysaccharide-23 10/15/2017   Td 09/10/2005   Zoster Recombinant(Shingrix) 08/22/2021, 10/25/2021, 02/28/2022   Zoster, Live 11/29/2006   Pertinent  Health Maintenance Due  Topic Date Due   INFLUENZA VACCINE  Completed   DEXA SCAN  Completed      12/14/2020    5:00 AM 12/14/2020    8:00 AM 12/14/2020    8:29 PM 12/15/2020    8:44 AM 10/02/2022    9:39 AM  Fall Risk  Falls in the past year?     0  Was there an injury with Fall?     0  Fall Risk Category Calculator     0  (RETIRED) Patient Fall Risk Level Moderate fall  risk Moderate fall risk Moderate fall risk High fall risk   Patient at Risk for Falls Due to     No Fall Risks  Fall risk Follow up     Falls evaluation completed   Functional Status Survey:    Vitals:   11/20/23 1146  BP: 115/83  Pulse: 72  Resp: 19  Temp: 97.6 F (36.4 C)  SpO2: 97%  Weight: 165 lb (74.8 kg)  Height: 5\' 3"  (1.6 m)   Body mass index is 29.23 kg/m. Physical Exam  Labs reviewed: Recent Labs    05/30/23 0000 06/21/23 0000 09/10/23 0000 11/07/23 0000  NA 138 134* 139 140  K 3.8 3.9 3.5 3.4*  CL 103 99 101 104  CO2 25* 26*  --  28*  BUN 10 15 20 18   CREATININE 0.6 0.7 0.8 0.8  CALCIUM 9.4 9.2 9.7 9.3   Recent Labs    05/30/23 0000 06/25/23 0000  AST 16 17  ALT 13 15  ALKPHOS 72 71  ALBUMIN 3.9 3.7   Recent Labs    05/30/23 0000 06/21/23 0000  WBC 5.4 6.0  NEUTROABS 3,434.00 3,630.00  HGB 12.9 13.7  HCT 39 43  PLT 125* 135*   Lab Results  Component Value Date   TSH 1.43 05/30/2023   Lab Results  Component Value Date   HGBA1C 5.9 (H) 09/26/2015   Lab Results  Component Value Date   CHOL  128 05/30/2023   HDL 51 05/30/2023   LDLCALC 58 05/30/2023   LDLDIRECT 147.0 11/17/2012   TRIG 106 05/30/2023   CHOLHDL 3 02/10/2020    Significant Diagnostic Results in last 30 days:  US Venous Img Lower Unilateral Left (DVT) Result Date: 11/13/2023 CLINICAL DATA:  Left lower extremity edema EXAM: LEFT LOWER EXTREMITY VENOUS DOPPLER ULTRASOUND TECHNIQUE: Gray-scale sonography with compression, as well as color and duplex ultrasound, were performed to evaluate the deep venous system(s) from the level of the common femoral vein through the popliteal and proximal calf veins. COMPARISON:  None Available. FINDINGS: VENOUS Normal compressibility of the common femoral, superficial femoral, and popliteal veins, as well as the visualized calf veins. Visualized portions of profunda femoral vein and great saphenous vein unremarkable. No filling defects to suggest DVT on grayscale or color Doppler imaging. Doppler waveforms show normal direction of venous flow, normal respiratory plasticity and response to augmentation. Limited views of the contralateral common femoral vein are unremarkable. OTHER None. Limitations: none IMPRESSION: Negative. Electronically Signed   By: Malachy Moan M.D.   On: 11/13/2023 16:02    Assessment/Plan  1. Hypokalemia (Primary) Lab Results  Component Value Date   K 3.4 (A) 11/07/2023   -  decrease KCL ER from 40 meq to 20 meq daily -  BMP in 1 week  2. Restless leg syndrome - disturbs her sleep -  will start on Gabapentin 100 mg daily PRN    Family/ staff Communication:   Discussed plan of care with resident and charge nurse.  Labs/tests ordered:  BMP in 1 week

## 2023-11-23 NOTE — Progress Notes (Incomplete)
 Location:  [facility] Room Number: AL [id number]-A Place of Service:  ALF [phone]) Provider:  Medina-Vargas,Elen Acero C,NP  Venita Sheffield, MD  Patient Care Team: Venita Sheffield, MD as PCP - General (Internal Medicine) Verner Chol, [facility] (Inactive) as Pharmacist (Pharmacist)  Extended Emergency Contact Information Primary Emergency Contact: Julio Sicks [place] of [place] Phone: [phone] Relation: Daughter  Code Status:  DNR Goals of care: Advanced Directive information    11/20/2023   11:48 AM  Advanced Directives  Does Patient Have a Medical Advance Directive? Yes  Type of Estate agent of [place];Living will  Does patient want to make changes to medical advance directive? No - Patient declined  Copy of Healthcare Power of Attorney in Chart? Yes - validated most recent copy scanned in chart (See row information)     Chief Complaint  Patient presents with  . Medical Management of Chronic Issues    Requesting medication adjustment and restless leg syndrome.    HPI:  Pt is a 89 y.o. female seen today for an acute visit for medication adjustment and restless leg syndrome.  She is a resident of [facility].  She is currently taking KCl ER 40 mEq daily for hypokalemia.  Latest K3.4, 11/07/2023 and K3.6, 10/01/23.  She stated she was previously diagnosed with RLS.  She is currently not taking any medication for it.  She stated that her legs has been bothering her. She has episodes of RLS whil   Past Medical History:  Diagnosis Date  . Arthritis   . COVID   . Depression   . Hyperlipidemia   . Osteopenia   . Stroke (HCC)   . Thrombocytopenia (HCC)   . Vitamin D deficiency   . Wrist fracture 09/10/2000   left   Past Surgical History:  Procedure Laterality Date  . APPENDECTOMY  1967  . CARPAL TUNNEL RELEASE  2008   right  . COLON RESECTION SIGMOID N/A 12/03/2020   Procedure: EXPLORATORY  LAPAROTOMY; HARTMAN'S PROCEDURE; SPLEENIC FLEXURE MOBILIZATION;  Surgeon: Romie Levee, MD;  Location: [facility];  Service: General;  Laterality: N/A;  . COLOSTOMY N/A 12/03/2020   Procedure: COLOSTOMY;  Surgeon: Romie Levee, MD;  Location: [facility];  Service: General;  Laterality: N/A;  . JOINT REPLACEMENT  2002   right total     Allergies  Allergen Reactions  . Bactrim [Sulfamethoxazole-Trimethoprim] Nausea Only  . Ditropan [Oxybutynin]     Made patient feel bad  . Sulfa Antibiotics     Other reaction(s): stomach upset    Outpatient Encounter Medications as of 11/20/2023  Medication Sig  . acetaminophen (TYLENOL) 325 MG tablet Take 650 mg by mouth 3 (three) times daily. SCHEDULED, See as needed dosing also  . Alum & Mag Hydroxide-Simeth (ANTACID & ANTIGAS PO) Take 30 mLs by mouth every 4 (four) hours as needed. for Indigestion  . aspirin EC 81 MG tablet Take 81 mg by mouth daily. Swallow whole.  . calcium carbonate (OSCAL) 1500 (600 Ca) MG TABS tablet Take 600 mg of elemental calcium by mouth daily.  . Cholecalciferol (VITAMIN D3) 2000 units capsule Take 2,000 Units by mouth daily.  . fluticasone (FLONASE) 50 MCG/ACT nasal spray Place 1 spray into both nostrils 2 (two) times daily.  . furosemide (LASIX) 20 MG tablet Take 40 mg by mouth daily.  Marland Kitchen loperamide (IMODIUM A-D) 2 MG tablet Take 2 mg by mouth as needed for diarrhea or loose stools.  . Magnesium 300 MG  CAPS Take 300 mg by mouth daily.  . magnesium hydroxide (MILK OF MAGNESIA) 400 MG/5ML suspension Take 30 mLs by mouth daily as needed for mild constipation.  . metoprolol tartrate (LOPRESSOR) 25 MG tablet Take 25 mg by mouth 2 (two) times daily.  . ondansetron (ZOFRAN) 4 MG tablet Take 4 mg by mouth every 6 (six) hours as needed for vomiting or nausea.  . pantoprazole (PROTONIX) 40 MG tablet Take 40 mg by mouth daily.  . polyethylene glycol (MIRALAX / GLYCOLAX) 17 g packet Take 17 g by mouth 3 (three) times daily.  . potassium  chloride SA (KLOR-CON) 20 MEQ tablet Take 20 mEq by mouth daily.  Marland Kitchen Propylene Glycol (SYSTANE BALANCE OP) Apply 1 drop to eye every evening. related to PRESENCE OF INTRAOCULAR LENS (Z96.1) wait 10 minutes between Systane and timolol drops  . rosuvastatin (CRESTOR) 10 MG tablet Take 10 mg by mouth at bedtime.  . Sennosides (SENNA) 8.6 MG CAPS Take one to two capsules as needed at bedtime If no bowel movements in 3 days  . simethicone (MYLICON) 80 MG chewable tablet Chew 1 tablet (80 mg total) by mouth 4 (four) times daily.  . timolol (TIMOPTIC) 0.5 % ophthalmic solution Place 1 drop into the left eye 2 (two) times daily. related to PRESENCE OF INTRAOCULAR LENS (Z96.1) Wait 10 minutes between Systane and timolol drops  . venlafaxine XR (EFFEXOR-XR) 75 MG 24 hr capsule TAKE 2 CAPSULES (150MG ) BY MOUTH DAILY.  . vitamin B-12 (CYANOCOBALAMIN) 1000 MCG tablet Take 1,000 mcg by mouth daily.  Marland Kitchen amLODipine (NORVASC) 5 MG tablet Take 5 mg by mouth daily. (Patient not taking: Reported on 11/08/2023)  . diclofenac Sodium (VOLTAREN ARTHRITIS PAIN) 1 % GEL Apply 2 g topically 3 (three) times daily as needed (Left wrist). (Patient not taking: Reported on 11/20/2023)  . LORazepam (ATIVAN) 0.5 MG tablet Take 0.5 mg by mouth every 8 (eight) hours as needed for anxiety. (Patient not taking: Reported on 11/20/2023)  . minoxidil (ROGAINE) 2 % external solution Apply topically daily as needed. (Patient not taking: Reported on 11/20/2023)   No facility-administered encounter medications on file as of 11/20/2023.    Review of Systems  Immunization History  Administered Date(s) Administered  . Fluad Quad(high Dose 65+) 06/13/2019, 07/02/2022  . Influenza Split 06/18/2012  . Influenza Whole 07/11/2010  . Influenza, High Dose Seasonal PF 07/07/2013, 07/14/2014, 06/24/2015, 06/13/2016, 06/04/2017, 06/17/2018  . Influenza-Unspecified 06/22/2020, 06/29/2021, 06/14/2023  . Moderna Covid-19 Vaccine Bivalent Booster 75yrs & up  01/26/2022  . Moderna SARS-COV2 Booster Vaccination 07/19/2020, 02/07/2021, 06/26/2023, 06/26/2023  . Moderna Sars-Covid-2 Vaccination 09/14/2019, 10/12/2019  . PFIZER(Purple Top)SARS-COV-2 Vaccination 05/30/2021  . Research officer, trade union 46yrs & up 07/12/2022  . Pneumococcal Conjugate-13 04/27/2014, 08/22/2021  . Pneumococcal Polysaccharide-23 10/15/2017  . Td 09/10/2005  . Zoster Recombinant(Shingrix) 08/22/2021, 10/25/2021, 02/28/2022  . Zoster, Live 11/29/2006   Pertinent  Health Maintenance Due  Topic Date Due  . INFLUENZA VACCINE  Completed  . DEXA SCAN  Completed      12/14/2020    5:00 AM 12/14/2020    8:00 AM 12/14/2020    8:29 PM 12/15/2020    8:44 AM 10/02/2022    9:39 AM  Fall Risk  Falls in the past year?     0  Was there an injury with Fall?     0  Fall Risk Category Calculator     0  (RETIRED) Patient Fall Risk Level Moderate fall risk Moderate fall risk Moderate fall  risk High fall risk   Patient at Risk for Falls Due to     No Fall Risks  Fall risk Follow up     Falls evaluation completed   Functional Status Survey:    Vitals:   11/20/23 1146  BP: 115/83  Pulse: 72  Resp: 19  Temp: 97.6 F (36.4 C)  SpO2: 97%  Weight: 165 lb (74.8 kg)  Height: 5\' 3"  (1.6 m)   Body mass index is 29.23 kg/m. Physical Exam  Labs reviewed: Recent Labs    05/30/23 0000 06/21/23 0000 09/10/23 0000 11/07/23 0000  NA 138 134* 139 140  K 3.8 3.9 3.5 3.4*  CL 103 99 101 104  CO2 25* 26*  --  28*  BUN 10 15 20 18   CREATININE 0.6 0.7 0.8 0.8  CALCIUM 9.4 9.2 9.7 9.3   Recent Labs    05/30/23 0000 06/25/23 0000  AST 16 17  ALT 13 15  ALKPHOS 72 71  ALBUMIN 3.9 3.7   Recent Labs    05/30/23 0000 06/21/23 0000  WBC 5.4 6.0  NEUTROABS 3,434.00 3,630.00  HGB 12.9 13.7  HCT 39 43  PLT 125* 135*   Lab Results  Component Value Date   TSH 1.43 05/30/2023   Lab Results  Component Value Date   HGBA1C 5.9 (H) 09/26/2015   Lab Results   Component Value Date   CHOL 128 05/30/2023   HDL 51 05/30/2023   LDLCALC 58 05/30/2023   LDLDIRECT 147.0 11/17/2012   TRIG 106 05/30/2023   CHOLHDL 3 02/10/2020    Significant Diagnostic Results in last 30 days:  US Venous Img Lower Unilateral Left (DVT) Result Date: 11/13/2023 CLINICAL DATA:  Left lower extremity edema EXAM: LEFT LOWER EXTREMITY VENOUS DOPPLER ULTRASOUND TECHNIQUE: Gray-scale sonography with compression, as well as color and duplex ultrasound, were performed to evaluate the deep venous system(s) from the level of the common femoral vein through the popliteal and proximal calf veins. COMPARISON:  None Available. FINDINGS: VENOUS Normal compressibility of the common femoral, superficial femoral, and popliteal veins, as well as the visualized calf veins. Visualized portions of profunda femoral vein and great saphenous vein unremarkable. No filling defects to suggest DVT on grayscale or color Doppler imaging. Doppler waveforms show normal direction of venous flow, normal respiratory plasticity and response to augmentation. Limited views of the contralateral common femoral vein are unremarkable. OTHER None. Limitations: none IMPRESSION: Negative. Electronically Signed   By: Malachy Moan M.D.   On: 11/13/2023 16:02    Assessment/Plan There are no diagnoses linked to this encounter.   Family/ staff Communication: ***  Labs/tests ordered:  ***

## 2023-11-26 ENCOUNTER — Other Ambulatory Visit: Payer: Self-pay

## 2023-11-26 DIAGNOSIS — I872 Venous insufficiency (chronic) (peripheral): Secondary | ICD-10-CM

## 2023-12-05 ENCOUNTER — Ambulatory Visit (HOSPITAL_COMMUNITY)
Admission: RE | Admit: 2023-12-05 | Discharge: 2023-12-05 | Disposition: A | Discharge status: Home / Self Care | Payer: Medicare Other | Source: Ambulatory Visit | Attending: Vascular Surgery | Admitting: Vascular Surgery

## 2023-12-05 ENCOUNTER — Encounter: Payer: Self-pay | Admitting: Vascular Surgery

## 2023-12-05 ENCOUNTER — Ambulatory Visit: Payer: Medicare Other | Admitting: Vascular Surgery

## 2023-12-05 VITALS — BP 153/83 | HR 66 | Temp 97.3°F | Resp 24 | Ht 63.0 in | Wt 164.4 lb

## 2023-12-05 DIAGNOSIS — I872 Venous insufficiency (chronic) (peripheral): Secondary | ICD-10-CM | POA: Diagnosis present

## 2023-12-05 NOTE — Progress Notes (Unsigned)
 Office Note     CC: Bilateral lower extremity edema Requesting Provider:  Mast, Man X, NP  HPI: Sara Logan is a 89 y.o. (02/10/1933) female who presents at the request of Venita Sheffield, MD for evaluation of bilateral lower extremity edema.  Exam, Sara Logan was doing well.  She has previous history of bilateral lower extremity vein stripping years ago.  She currently lives in an assisted living center after having undergone colectomy with colostomy for distal sigmoid obstruction.  Sara Logan is struggled with lower extremity edema for a number of years.  She sits in the dependent position for a large portion of the day.  She has had issues with fluid overload in the past.  Tries to wear compression stockings as much as possible but this has become tougher due to mobility issues.  Sara Logan notes heaviness and swelling in bilateral lower extremities.  Denies bleeding, denies ulceration. Denies previous history of DVT. Denies claudication, ischemic rest pain, tissue loss.    Past Medical History:  Diagnosis Date   Arthritis    COVID    Depression    Hyperlipidemia    Osteopenia    Peripheral vascular disease (HCC)    Stroke (HCC)    Thrombocytopenia (HCC)    Vitamin D deficiency    Wrist fracture 09/10/2000   left    Past Surgical History:  Procedure Laterality Date   APPENDECTOMY  1967   CARPAL TUNNEL RELEASE  2008   right   COLON RESECTION SIGMOID N/A 12/03/2020   Procedure: EXPLORATORY LAPAROTOMY; HARTMAN'S PROCEDURE; SPLEENIC FLEXURE MOBILIZATION;  Surgeon: Romie Levee, MD;  Location: [facility];  Service: General;  Laterality: N/A;   COLOSTOMY N/A 12/03/2020   Procedure: COLOSTOMY;  Surgeon: Romie Levee, MD;  Location: [facility];  Service: General;  Laterality: N/A;   JOINT REPLACEMENT  2002   right total     Social History   Socioeconomic History   Marital status: Widowed    Spouse name: Not on file   Number of children: 4   Years of education: 16   Highest  education level: Bachelor's degree (e.g., BA, AB, BS)  Occupational History   Not on file  Tobacco Use   Smoking status: Never   Smokeless tobacco: Never  Vaping Use   Vaping status: Never Used  Substance and Sexual Activity   Alcohol use: No   Drug use: No   Sexual activity: Not on file  Other Topics Concern   Not on file  Social History Narrative   Lives in senior apartment at [facility]   widowed   Social Drivers of Health   Financial Resource Strain: Low Risk  (10/16/2019)   Overall Financial Resource Strain (CARDIA)    Difficulty of Paying Living Expenses: Not hard at all  Food Insecurity: No Food Insecurity (10/16/2019)   Hunger Vital Sign    Worried About Running Out of Food in the Last Year: Never true    Ran Out of Food in the Last Year: Never true  Transportation Needs: No Transportation Needs (10/16/2019)   PRAPARE - Administrator, Civil Service (Medical): No    Lack of Transportation (Non-Medical): No  Physical Activity: Inactive (10/16/2019)   Exercise Vital Sign    Days of Exercise per Week: 0 days    Minutes of Exercise per Session: 0 min  Stress: No Stress Concern Present (10/16/2019)   [employer] of Occupational Health - Occupational Stress Questionnaire    Feeling of Stress : Only  a little  Social Connections: Unknown (10/16/2019)   Social Connection and Isolation Panel [NHANES]    Frequency of Communication with Friends and Family: More than three times a week    Frequency of Social Gatherings with Friends and Family: Once a week    Attends Religious Services: Not on Marketing executive or Organizations: Yes    Attends Banker Meetings: Not on file    Marital Status: Widowed  Catering manager Violence: Not on file   Family History  Problem Relation Age of Onset   Breast cancer Mother    Breast cancer Sister    Other Son        unsure of cause   Arthritis Son    Ovarian cancer Daughter    Crohn's disease  Daughter    Colon cancer Neg Hx    Esophageal cancer Neg Hx     Current Outpatient Medications  Medication Sig Dispense Refill   acetaminophen (TYLENOL) 325 MG tablet Take 650 mg by mouth 3 (three) times daily. SCHEDULED, See as needed dosing also     Alum & Mag Hydroxide-Simeth (ANTACID & ANTIGAS PO) Take 30 mLs by mouth every 4 (four) hours as needed. for Indigestion     aspirin EC 81 MG tablet Take 81 mg by mouth daily. Swallow whole.     calcium carbonate (OSCAL) 1500 (600 Ca) MG TABS tablet Take 600 mg of elemental calcium by mouth daily.     Cholecalciferol (VITAMIN D3) 2000 units capsule Take 2,000 Units by mouth daily.     fluticasone (FLONASE) 50 MCG/ACT nasal spray Place 1 spray into both nostrils 2 (two) times daily.     furosemide (LASIX) 20 MG tablet Take 40 mg by mouth daily.     loperamide (IMODIUM A-D) 2 MG tablet Take 2 mg by mouth as needed for diarrhea or loose stools.     Magnesium 300 MG CAPS Take 300 mg by mouth daily.     magnesium hydroxide (MILK OF MAGNESIA) 400 MG/5ML suspension Take 30 mLs by mouth daily as needed for mild constipation.     metoprolol tartrate (LOPRESSOR) 25 MG tablet Take 25 mg by mouth 2 (two) times daily.     ondansetron (ZOFRAN) 4 MG tablet Take 4 mg by mouth every 6 (six) hours as needed for vomiting or nausea.     pantoprazole (PROTONIX) 40 MG tablet Take 40 mg by mouth daily.     polyethylene glycol (MIRALAX / GLYCOLAX) 17 g packet Take 17 g by mouth 3 (three) times daily.     potassium chloride SA (KLOR-CON) 20 MEQ tablet Take 20 mEq by mouth daily.     Propylene Glycol (SYSTANE BALANCE OP) Apply 1 drop to eye every evening. related to PRESENCE OF INTRAOCULAR LENS (Z96.1) wait 10 minutes between Systane and timolol drops     rosuvastatin (CRESTOR) 10 MG tablet Take 10 mg by mouth at bedtime.     Sennosides (SENNA) 8.6 MG CAPS Take one to two capsules as needed at bedtime If no bowel movements in 3 days     simethicone (MYLICON) 80 MG  chewable tablet Chew 1 tablet (80 mg total) by mouth 4 (four) times daily.     timolol (TIMOPTIC) 0.5 % ophthalmic solution Place 1 drop into the left eye 2 (two) times daily. related to PRESENCE OF INTRAOCULAR LENS (Z96.1) Wait 10 minutes between Systane and timolol drops     venlafaxine XR (EFFEXOR-XR) 75 MG 24  hr capsule TAKE 2 CAPSULES (150MG ) BY MOUTH DAILY. 60 capsule 5   vitamin B-12 (CYANOCOBALAMIN) 1000 MCG tablet Take 1,000 mcg by mouth daily.     amLODipine (NORVASC) 5 MG tablet Take 5 mg by mouth daily. (Patient not taking: Reported on 11/08/2023)     diclofenac Sodium (VOLTAREN ARTHRITIS PAIN) 1 % GEL Apply 2 g topically 3 (three) times daily as needed (Left wrist). (Patient not taking: Reported on 11/08/2023)     LORazepam (ATIVAN) 0.5 MG tablet Take 0.5 mg by mouth every 8 (eight) hours as needed for anxiety. (Patient not taking: Reported on 12/05/2023)     minoxidil (ROGAINE) 2 % external solution Apply topically daily as needed. (Patient not taking: Reported on 11/08/2023)     No current facility-administered medications for this visit.    Allergies  Allergen Reactions   Bactrim [Sulfamethoxazole-Trimethoprim] Nausea Only   Ditropan [Oxybutynin]     Made patient feel bad   Sulfa Antibiotics     Other reaction(s): stomach upset     REVIEW OF SYSTEMS:  [X]  denotes positive finding, [ ]  denotes negative finding Cardiac  Comments:  Chest pain or chest pressure:    Shortness of breath upon exertion:    Short of breath when lying flat:    Irregular heart rhythm:        Vascular    Pain in calf, thigh, or hip brought on by ambulation:    Pain in feet at night that wakes you up from your sleep:     Blood clot in your veins:    Leg swelling:         Pulmonary    Oxygen at home:    Productive cough:     Wheezing:         Neurologic    Sudden weakness in arms or legs:     Sudden numbness in arms or legs:     Sudden onset of difficulty speaking or slurred speech:     Temporary loss of vision in one eye:     Problems with dizziness:         Gastrointestinal    Blood in stool:     Vomited blood:         Genitourinary    Burning when urinating:     Blood in urine:        Psychiatric    Major depression:         Hematologic    Bleeding problems:    Problems with blood clotting too easily:        Skin    Rashes or ulcers:        Constitutional    Fever or chills:      PHYSICAL EXAMINATION:  There were no vitals filed for this visit.  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 Musculoskeletal: no muscle wasting or atrophy  Neurologic: A&O X 3;  No focal weakness or paresthesias are detected Psychiatric:  The pt has Normal affect.  Non-Invasive Vascular Imaging:   +--------------+--------+------+----------+------------+-------------------  ----+  LEFT         Reflux  Reflux  Reflux  Diameter cmsComments                                No       Yes     Time                                         +--------------+--------+------+----------+------------+-------------------  ----+  CFV                   yes  >1 second                                       +--------------+--------+------+----------+------------+-------------------  ----+  FV mid                 yes  >1 second                                        +--------------+--------+------+----------+------------+-------------------  ----+  Popliteal             yes  >1 second                                       +--------------+--------+------+----------+------------+-------------------  ----+  GSV at [facility]                                        prior                                                                       ablation/stripping        +--------------+--------+------+----------+------------+-------------------  ----+  GSV prox thigh                                    prior                                                                       ablation/stripping        +--------------+--------+------+----------+------------+-------------------  ----+  GSV mid thigh                                     prior  ablation/stripping        +--------------+--------+------+----------+------------+-------------------  ----+  GSV dist thigh                                    prior                                                                       ablation/stripping        +--------------+--------+------+----------+------------+-------------------  ----+  GSV at knee                                       prior                                                                       ablation/stripping        +--------------+--------+------+----------+------------+-------------------  ----+  GSV prox calf                                     prior                                                                       ablation/stripping        +--------------+--------+------+----------+------------+-------------------  ----+  SSV Pop Fossa no                          0.12                               +--------------+--------+------+----------+------------+-------------------  ----+      ASSESSMENT/PLAN:: 89 y.o. female presenting with bilateral lower extremity edema with prior history of bilateral greater saphenous vein stripping.  On physical exam, she had chronic skin changes including hemosiderin deposition and lipodermatosclerosis. Venous duplex ultrasound was reviewed of the left leg demonstrating nonexistent greater saphenous vein, as well as reflux within the deep system.  From a vascular surgery standpoint, Sara Logan is out of surgical options.  She would be best served with continued elevation and compression.  Recommend managing fluid if Lasix is necessary.  Also recommend new echo to ensure there is not a cardiac component.  Sara Logan can follow-up with me as needed.  Victorino Sparrow, MD [facility] [phone]

## 2024-01-06 ENCOUNTER — Encounter: Payer: Self-pay | Admitting: Sports Medicine

## 2024-01-06 ENCOUNTER — Non-Acute Institutional Stay: Payer: Self-pay | Admitting: Sports Medicine

## 2024-01-06 DIAGNOSIS — I10 Essential (primary) hypertension: Secondary | ICD-10-CM

## 2024-01-06 DIAGNOSIS — Z933 Colostomy status: Secondary | ICD-10-CM | POA: Diagnosis not present

## 2024-01-06 DIAGNOSIS — K219 Gastro-esophageal reflux disease without esophagitis: Secondary | ICD-10-CM

## 2024-01-06 DIAGNOSIS — T7840XS Allergy, unspecified, sequela: Secondary | ICD-10-CM

## 2024-01-06 DIAGNOSIS — I872 Venous insufficiency (chronic) (peripheral): Secondary | ICD-10-CM | POA: Diagnosis not present

## 2024-01-06 DIAGNOSIS — G629 Polyneuropathy, unspecified: Secondary | ICD-10-CM

## 2024-01-06 DIAGNOSIS — M150 Primary generalized (osteo)arthritis: Secondary | ICD-10-CM | POA: Diagnosis not present

## 2024-01-06 NOTE — Progress Notes (Signed)
 Provider:  Dr. Tye Gall Location:  [facility] Place of Service:   Assisted living   PCP: Tye Gall, MD Patient Care Team: Tye Gall, MD as PCP - General (Internal Medicine) Alver Austin, [facility] (Inactive) as Pharmacist (Pharmacist)  Extended Emergency Contact Information Primary Emergency Contact: Logan,Sara  [place] [employer]: [phone] Relation: Daughter  Goals of Care: Advanced Directive information    11/20/2023   11:48 AM  Advanced Directives  Does Patient Have a Medical Advance Directive? Yes  Type of Estate agent of [place];Living will  Does patient want to make changes to medical advance directive? No - Patient declined  Copy of Healthcare Power of Attorney in Chart? Yes - validated most recent copy scanned in chart (See row information)      No chief complaint on file.      History of Present Illness  Manage yeah yeah 89 year old female with a history of CVA, hypertension, hyperlipidemia, depression, constipation, colostomy, overactive bladder is evaluated for the chronic disease management  Pt  seen and examined in her room  Seems pleasant and comfortable and does not appear to be in distress She is working on her computer  Patient has a chronic lower extremity swelling.  She was recently evaluated by vascular surgery.  She is not a surgical candidate and was recommended to continue with the Lasix for lower extremity swelling. Patient reports exertional shortness of breath but no recent change.  She ambulates with a walker.  She goes to the dining room to eat her meals. She is able to speak in full sentences, does not appear to be in distress.  Patient has a history of colostomy.  Denies abdominal pain, nausea, vomiting.  Hypertension-blood pressure 144/80 she is currently on metoprolol   Patient denies chest pain, abdominal pain, nausea, vomiting, dysuria,  hematuria, bloody or dark stools    Past Medical History:  Diagnosis Date   Arthritis    COVID    Depression    Hyperlipidemia    Osteopenia    Peripheral vascular disease (HCC)    Stroke (HCC)    Thrombocytopenia (HCC)    Vitamin D  deficiency    Wrist fracture 09/10/2000   left   Past Surgical History:  Procedure Laterality Date   APPENDECTOMY  1967   CARPAL TUNNEL RELEASE  2008   right   COLON RESECTION SIGMOID N/A 12/03/2020   Procedure: EXPLORATORY LAPAROTOMY; HARTMAN'S PROCEDURE; SPLEENIC FLEXURE MOBILIZATION;  Surgeon: Joyce Nixon, MD;  Location: [facility];  Service: General;  Laterality: N/A;   COLOSTOMY N/A 12/03/2020   Procedure: COLOSTOMY;  Surgeon: Joyce Nixon, MD;  Location: [facility];  Service: General;  Laterality: N/A;   JOINT REPLACEMENT  2002   right total     reports that she has never smoked. She has never used smokeless tobacco. She reports that she does not drink alcohol and does not use drugs. Social History   Socioeconomic History   Marital status: Widowed    Spouse name: Not on file   Number of children: 4   Years of education: 16   Highest education level: Bachelor's degree (e.g., BA, AB, BS)  Occupational History   Not on file  Tobacco Use   Smoking status: Never   Smokeless tobacco: Never  Vaping Use   Vaping status: Never Used  Substance and Sexual Activity   Alcohol use: No   Drug use: No   Sexual activity: Not on file  Other Topics Concern  Not on file  Social History Narrative   L[facility] in senior apartment [facility]   widowed   Social Drivers of Health   Financial Resource Strain: Low Risk  (10/16/2019)   Overall Financial Resource Strain (CARDIA)    Difficulty of Paying Living Expenses: Not hard at all  Food Insecurity: No Food Insecurity (10/16/2019)   Hunger Vital Sign    Worried About Running Out of Food in the Last Year: Never true    Ran Out of Food in the Last Year: Never true  Transportation Needs: No  Transportation Needs (10/16/2019)   PRAPARE - Administrator, Civil Service (Medical): No    Lack of Transportation (Non-Medical): No  Physical Activity: Inactive (10/16/2019)   Exercise Vital Sign    Days of Exercise per Week: 0 days    Minutes of Exercise per Session: 0 min  Stress: No Stress Concern Present (10/16/2019)   [employer] of Occupational Health - Occupational Stress Questionnaire    Feeling of Stress : Only a little  Social Connections: Unknown (10/16/2019)   Social Connection and Isolation Panel [NHANES]    Frequency of Communication with Friends and Family: More than three times a week    Frequency of Social Gatherings with Friends and Family: Once a week    Attends Religious Services: Not on Marketing executive or Organizations: Yes    Attends Banker Meetings: Not on file    Marital Status: Widowed  Catering manager Violence: Not on file    Functional Status Survey:    Family History  Problem Relation Age of Onset   Breast cancer Mother    Breast cancer Sister    Other Son        unsure of cause   Arthritis Son    Ovarian cancer Daughter    Crohn's disease Daughter    Colon cancer Neg Hx    Esophageal cancer Neg Hx     Health Maintenance  Topic Date Due   COVID-19 Vaccine (6 - 2024-25 season) 09/10/2024 (Originally 08/21/2023)   INFLUENZA VACCINE  04/10/2024   Medicare Annual Wellness (AWV)  09/01/2024   Pneumonia Vaccine 47+ Years old  Completed   DEXA SCAN  Completed   Zoster Vaccines- Shingrix  Completed   HPV VACCINES  Aged Out   Meningococcal B Vaccine  Aged Out   DTaP/Tdap/Td  Discontinued    Allergies  Allergen Reactions   Bactrim  [Sulfamethoxazole -Trimethoprim ] Nausea Only   Ditropan  [Oxybutynin ]     Made patient feel bad   Sulfa  Antibiotics     Other reaction(s): stomach upset    Outpatient Encounter Medications as of 01/06/2024  Medication Sig   acetaminophen  (TYLENOL ) 325 MG tablet Take 650 mg  by mouth 3 (three) times daily. SCHEDULED, See as needed dosing also   Alum & Mag Hydroxide-Simeth (ANTACID & ANTIGAS PO) Take 30 mLs by mouth every 4 (four) hours as needed. for Indigestion   amLODipine  (NORVASC ) 5 MG tablet Take 5 mg by mouth daily. (Patient not taking: Reported on 11/08/2023)   aspirin  EC 81 MG tablet Take 81 mg by mouth daily. Swallow whole.   calcium  carbonate (OSCAL) 1500 (600 Ca) MG TABS tablet Take 600 mg of elemental calcium  by mouth daily.   Cholecalciferol  (VITAMIN D3) 2000 units capsule Take 2,000 Units by mouth daily.   diclofenac Sodium (VOLTAREN ARTHRITIS PAIN) 1 % GEL Apply 2 g topically 3 (three) times daily as needed (Left  wrist). (Patient not taking: Reported on 11/08/2023)   fluticasone (FLONASE) 50 MCG/ACT nasal spray Place 1 spray into both nostrils 2 (two) times daily.   furosemide (LASIX) 20 MG tablet Take 40 mg by mouth daily.   loperamide (IMODIUM A-D) 2 MG tablet Take 2 mg by mouth as needed for diarrhea or loose stools.   LORazepam (ATIVAN) 0.5 MG tablet Take 0.5 mg by mouth every 8 (eight) hours as needed for anxiety. (Patient not taking: Reported on 12/05/2023)   Magnesium  300 MG CAPS Take 300 mg by mouth daily.   magnesium  hydroxide (MILK OF MAGNESIA) 400 MG/5ML suspension Take 30 mLs by mouth daily as needed for mild constipation.   metoprolol  tartrate (LOPRESSOR ) 25 MG tablet Take 25 mg by mouth 2 (two) times daily.   minoxidil (ROGAINE) 2 % external solution Apply topically daily as needed. (Patient not taking: Reported on 11/08/2023)   ondansetron  (ZOFRAN ) 4 MG tablet Take 4 mg by mouth every 6 (six) hours as needed for vomiting or nausea.   pantoprazole  (PROTONIX ) 40 MG tablet Take 40 mg by mouth daily.   polyethylene glycol (MIRALAX  / GLYCOLAX ) 17 g packet Take 17 g by mouth 3 (three) times daily.   potassium chloride  SA (KLOR-CON ) 20 MEQ tablet Take 20 mEq by mouth daily.   Propylene Glycol (SYSTANE BALANCE OP) Apply 1 drop to eye every evening.  related to PRESENCE OF INTRAOCULAR LENS (Z96.1) wait 10 minutes between Systane and timolol drops   rosuvastatin  (CRESTOR ) 10 MG tablet Take 10 mg by mouth at bedtime.   Sennosides (SENNA) 8.6 MG CAPS Take one to two capsules as needed at bedtime If no bowel movements in 3 days   simethicone  (MYLICON) 80 MG chewable tablet Chew 1 tablet (80 mg total) by mouth 4 (four) times daily.   timolol (TIMOPTIC) 0.5 % ophthalmic solution Place 1 drop into the left eye 2 (two) times daily. related to PRESENCE OF INTRAOCULAR LENS (Z96.1) Wait 10 minutes between Systane and timolol drops   venlafaxine  XR (EFFEXOR -XR) 75 MG 24 hr capsule TAKE 2 CAPSULES (150MG ) BY MOUTH DAILY.   vitamin B-12 (CYANOCOBALAMIN) 1000 MCG tablet Take 1,000 mcg by mouth daily.   No facility-administered encounter medications on file as of 01/06/2024.    Review of Systems Negative unless indicated in HPI.  There were no vitals filed for this visit. There is no height or weight on file to calculate BMI. BP Readings from Last 3 Encounters:  12/05/23 (!) 153/83  11/20/23 115/83  11/08/23 115/83   Wt Readings from Last 3 Encounters:  12/05/23 164 lb 6.4 oz (74.6 kg)  11/20/23 165 lb (74.8 kg)  11/08/23 169 lb (76.7 kg)   Physical Exam  Labs reviewed: Basic Metabolic Panel: Recent Labs    05/30/23 0000 06/21/23 0000 09/10/23 0000 11/07/23 0000  NA 138 134* 139 140  K 3.8 3.9 3.5 3.4*  CL 103 99 101 104  CO2 25* 26*  --  28*  BUN 10 15 20 18   CREATININE 0.6 0.7 0.8 0.8  CALCIUM  9.4 9.2 9.7 9.3   Liver Function Tests: Recent Labs    05/30/23 0000 06/25/23 0000  AST 16 17  ALT 13 15  ALKPHOS 72 71  ALBUMIN  3.9 3.7   No results for input(s): "LIPASE", "AMYLASE" in the last 8760 hours. No results for input(s): "AMMONIA" in the last 8760 hours. CBC: Recent Labs    05/30/23 0000 06/21/23 0000  WBC 5.4 6.0  NEUTROABS 3,434.00 3,630.00  HGB  12.9 13.7  HCT 39 43  PLT 125* 135*   Cardiac Enzymes: No  results for input(s): "CKTOTAL", "CKMB", "CKMBINDEX", "TROPONINI" in the last 8760 hours. BNP: Invalid input(s): "POCBNP" Lab Results  Component Value Date   HGBA1C 5.9 (H) 09/26/2015   Lab Results  Component Value Date   TSH 1.43 05/30/2023   Lab Results  Component Value Date   VITAMINB12 942 12/21/2020   No results found for: "FOLATE" Lab Results  Component Value Date   IRON 22 (L) 04/22/2015   TIBC 402 04/22/2015   FERRITIN 23 04/22/2015    Imaging and Procedures obtained prior to SNF admission: VAS US  LOWER EXTREMITY VENOUS REFLUX Result Date: 12/05/2023  Lower Venous Reflux Study Patient Name:  Sara Logan  Date of Exam:   12/05/2023 Medical Rec #: [id number]       Accession #:    [id number] Date of Birth: 08/03/1933       Patient Gender: F Patient Age:   67 years Exam Location:  [facility] Procedure:      VAS US  LOWER EXTREMITY VENOUS REFLUX Referring Phys: Melodie Spry ROBINS --------------------------------------------------------------------------------  Indications: Left leg edema with history of stripping/ablation.  Performing Technologist: Parke Boll RVS, RCS  Examination Guidelines: A complete evaluation includes B-mode imaging, spectral Doppler, color Doppler, and power Doppler as needed of all accessible portions of each vessel. Bilateral testing is considered an integral part of a complete examination. Limited examinations for reoccurring indications may be performed as noted. The reflux portion of the exam is performed with the patient in reverse Trendelenburg. Significant venous reflux is defined as >500 ms in the superficial venous system, and >1 second in the deep venous system.  +--------------+--------+------+----------+------------+-----------------------+ LEFT          Reflux  Reflux  Reflux  Diameter cmsComments                              No       Yes     Time                                        +--------------+--------+------+----------+------------+-----------------------+ CFV                    yes  >1 second                                     +--------------+--------+------+----------+------------+-----------------------+ FV mid                 yes  >1 second                                     +--------------+--------+------+----------+------------+-----------------------+ Popliteal              yes  >1 second                                     +--------------+--------+------+----------+------------+-----------------------+ GSV at [facility]  prior                                                                     ablation/stripping      +--------------+--------+------+----------+------------+-----------------------+ GSV prox thigh                                    prior                                                                     ablation/stripping      +--------------+--------+------+----------+------------+-----------------------+ GSV mid thigh                                     prior                                                                     ablation/stripping      +--------------+--------+------+----------+------------+-----------------------+ GSV dist thigh                                    prior                                                                     ablation/stripping      +--------------+--------+------+----------+------------+-----------------------+ GSV at knee                                       prior                                                                     ablation/stripping      +--------------+--------+------+----------+------------+-----------------------+ GSV prox calf                                     prior  ablation/stripping       +--------------+--------+------+----------+------------+-----------------------+ SSV Pop Fossa no                          0.12                            +--------------+--------+------+----------+------------+-----------------------+   Summary: Left: - No evidence of deep vein thrombosis from the common femoral through the popliteal veins. - Deep venous system is not competent. - The great saphenous vein was not visualized and consistent with history of ablation/stripping. - The small saphenous vein is competent.  *See table(s) above for measurements and observations. Electronically signed by Irvin Mantel on 12/05/2023 at 4:26:43 PM.    Final     Assessment and Plan Assessment & Plan  Chronic lower extremity swelling The patient reports that her swelling is improving. Lungs clear to auscultation Patient is able to speak in full sentences, does not appear to be in distress Continue with Lasix Avoid salty food Continue with compression stockings Elevate feet  Hypertension-blood pressure 144/80 Continue with metoprolol  twice daily Avoid salty foods Monitor blood pressure regularly  Osteoarthritis Continue Tylenol  as needed   Depression Stable Continue with venlafaxine   Neuropathy Continue with the gabapentin  Hyperlipidemia Continue with Crestor    Seasonal allergies Continue with Flonase.    30 MIN Total time spent for obtaining history,  performing a medically appropriate examination and evaluation, reviewing the tests,  documenting clinical information in the electronic or other health record,care coordination (not separately reported)

## 2024-01-11 ENCOUNTER — Emergency Department (HOSPITAL_COMMUNITY)

## 2024-01-11 ENCOUNTER — Other Ambulatory Visit: Payer: Self-pay

## 2024-01-11 ENCOUNTER — Inpatient Hospital Stay (HOSPITAL_COMMUNITY)
Admission: EM | Admit: 2024-01-11 | Discharge: 2024-02-09 | DRG: 064 | Disposition: E | Source: Skilled Nursing Facility | Attending: Internal Medicine | Admitting: Internal Medicine

## 2024-01-11 ENCOUNTER — Encounter (HOSPITAL_COMMUNITY): Payer: Self-pay | Admitting: *Deleted

## 2024-01-11 DIAGNOSIS — Z66 Do not resuscitate: Secondary | ICD-10-CM | POA: Diagnosis present

## 2024-01-11 DIAGNOSIS — M8580 Other specified disorders of bone density and structure, unspecified site: Secondary | ICD-10-CM | POA: Diagnosis present

## 2024-01-11 DIAGNOSIS — I6782 Cerebral ischemia: Secondary | ICD-10-CM | POA: Diagnosis present

## 2024-01-11 DIAGNOSIS — D696 Thrombocytopenia, unspecified: Secondary | ICD-10-CM | POA: Diagnosis present

## 2024-01-11 DIAGNOSIS — I611 Nontraumatic intracerebral hemorrhage in hemisphere, cortical: Secondary | ICD-10-CM | POA: Diagnosis present

## 2024-01-11 DIAGNOSIS — Z9049 Acquired absence of other specified parts of digestive tract: Secondary | ICD-10-CM

## 2024-01-11 DIAGNOSIS — Z9889 Other specified postprocedural states: Secondary | ICD-10-CM

## 2024-01-11 DIAGNOSIS — F32A Depression, unspecified: Secondary | ICD-10-CM | POA: Diagnosis present

## 2024-01-11 DIAGNOSIS — Z933 Colostomy status: Secondary | ICD-10-CM

## 2024-01-11 DIAGNOSIS — Z9181 History of falling: Secondary | ICD-10-CM

## 2024-01-11 DIAGNOSIS — M1990 Unspecified osteoarthritis, unspecified site: Secondary | ICD-10-CM | POA: Diagnosis present

## 2024-01-11 DIAGNOSIS — I739 Peripheral vascular disease, unspecified: Secondary | ICD-10-CM | POA: Diagnosis present

## 2024-01-11 DIAGNOSIS — Z8616 Personal history of COVID-19: Secondary | ICD-10-CM | POA: Diagnosis not present

## 2024-01-11 DIAGNOSIS — E559 Vitamin D deficiency, unspecified: Secondary | ICD-10-CM | POA: Diagnosis present

## 2024-01-11 DIAGNOSIS — E876 Hypokalemia: Secondary | ICD-10-CM | POA: Diagnosis present

## 2024-01-11 DIAGNOSIS — I10 Essential (primary) hypertension: Secondary | ICD-10-CM | POA: Diagnosis present

## 2024-01-11 DIAGNOSIS — R17 Unspecified jaundice: Secondary | ICD-10-CM | POA: Diagnosis present

## 2024-01-11 DIAGNOSIS — Z8041 Family history of malignant neoplasm of ovary: Secondary | ICD-10-CM

## 2024-01-11 DIAGNOSIS — Z7982 Long term (current) use of aspirin: Secondary | ICD-10-CM

## 2024-01-11 DIAGNOSIS — R9082 White matter disease, unspecified: Secondary | ICD-10-CM | POA: Diagnosis present

## 2024-01-11 DIAGNOSIS — E66811 Obesity, class 1: Secondary | ICD-10-CM | POA: Diagnosis present

## 2024-01-11 DIAGNOSIS — G9349 Other encephalopathy: Secondary | ICD-10-CM | POA: Diagnosis present

## 2024-01-11 DIAGNOSIS — I609 Nontraumatic subarachnoid hemorrhage, unspecified: Principal | ICD-10-CM

## 2024-01-11 DIAGNOSIS — Z888 Allergy status to other drugs, medicaments and biological substances status: Secondary | ICD-10-CM

## 2024-01-11 DIAGNOSIS — I468 Cardiac arrest due to other underlying condition: Secondary | ICD-10-CM | POA: Diagnosis present

## 2024-01-11 DIAGNOSIS — Z79899 Other long term (current) drug therapy: Secondary | ICD-10-CM

## 2024-01-11 DIAGNOSIS — G935 Compression of brain: Secondary | ICD-10-CM | POA: Diagnosis present

## 2024-01-11 DIAGNOSIS — Z8379 Family history of other diseases of the digestive system: Secondary | ICD-10-CM

## 2024-01-11 DIAGNOSIS — I608 Other nontraumatic subarachnoid hemorrhage: Secondary | ICD-10-CM | POA: Diagnosis present

## 2024-01-11 DIAGNOSIS — Z8261 Family history of arthritis: Secondary | ICD-10-CM

## 2024-01-11 DIAGNOSIS — I6012 Nontraumatic subarachnoid hemorrhage from left middle cerebral artery: Secondary | ICD-10-CM | POA: Diagnosis present

## 2024-01-11 DIAGNOSIS — E785 Hyperlipidemia, unspecified: Secondary | ICD-10-CM | POA: Diagnosis present

## 2024-01-11 DIAGNOSIS — Z881 Allergy status to other antibiotic agents status: Secondary | ICD-10-CM

## 2024-01-11 DIAGNOSIS — Z515 Encounter for palliative care: Secondary | ICD-10-CM

## 2024-01-11 DIAGNOSIS — Z803 Family history of malignant neoplasm of breast: Secondary | ICD-10-CM

## 2024-01-11 DIAGNOSIS — Z6831 Body mass index (BMI) 31.0-31.9, adult: Secondary | ICD-10-CM

## 2024-01-11 DIAGNOSIS — Z8673 Personal history of transient ischemic attack (TIA), and cerebral infarction without residual deficits: Secondary | ICD-10-CM

## 2024-01-11 DIAGNOSIS — Z961 Presence of intraocular lens: Secondary | ICD-10-CM | POA: Diagnosis present

## 2024-01-11 DIAGNOSIS — I6002 Nontraumatic subarachnoid hemorrhage from left carotid siphon and bifurcation: Secondary | ICD-10-CM | POA: Diagnosis present

## 2024-01-11 DIAGNOSIS — Z882 Allergy status to sulfonamides status: Secondary | ICD-10-CM

## 2024-01-11 LAB — PROTIME-INR
INR: 1 (ref 0.8–1.2)
INR: 1.1 (ref 0.8–1.2)
Prothrombin Time: 13.8 s (ref 11.4–15.2)
Prothrombin Time: 14 s (ref 11.4–15.2)

## 2024-01-11 LAB — COMPREHENSIVE METABOLIC PANEL WITH GFR
ALT: 17 U/L (ref 0–44)
AST: 31 U/L (ref 15–41)
Albumin: 4.2 g/dL (ref 3.5–5.0)
Alkaline Phosphatase: 58 U/L (ref 38–126)
Anion gap: 15 (ref 5–15)
BUN: 17 mg/dL (ref 8–23)
CO2: 24 mmol/L (ref 22–32)
Calcium: 10.1 mg/dL (ref 8.9–10.3)
Chloride: 102 mmol/L (ref 98–111)
Creatinine, Ser: 0.82 mg/dL (ref 0.44–1.00)
GFR, Estimated: >60 mL/min (ref >60)
Glucose, Bld: 158 mg/dL — ABNORMAL HIGH (ref 70–99)
Potassium: 3.1 mmol/L — ABNORMAL LOW (ref 3.5–5.1)
Sodium: 141 mmol/L (ref 135–145)
Total Bilirubin: 1.6 mg/dL — ABNORMAL HIGH (ref 0.0–1.2)
Total Protein: 7.5 g/dL (ref 6.5–8.1)

## 2024-01-11 LAB — CBC
HCT: 39.3 % (ref 36.0–46.0)
HCT: 37.4 % (ref 36.0–46.0)
Hemoglobin: 13 g/dL (ref 12.0–15.0)
Hemoglobin: 12.2 g/dL (ref 12.0–15.0)
MCH: 31.1 pg (ref 26.0–34.0)
MCH: 30.7 pg (ref 26.0–34.0)
MCHC: 33.1 g/dL (ref 30.0–36.0)
MCHC: 32.6 g/dL (ref 30.0–36.0)
MCV: 94 fL (ref 80.0–100.0)
MCV: 94 fL (ref 80.0–100.0)
Platelets: 95 10*3/uL — ABNORMAL LOW (ref 150–400)
Platelets: 86 10*3/uL — ABNORMAL LOW (ref 150–400)
RBC: 4.18 MIL/uL (ref 3.87–5.11)
RBC: 3.98 MIL/uL (ref 3.87–5.11)
RDW: 13.4 % (ref 11.5–15.5)
RDW: 13.2 % (ref 11.5–15.5)
WBC: 4.8 10*3/uL (ref 4.0–10.5)
WBC: 6 10*3/uL (ref 4.0–10.5)
nRBC: 0 % (ref 0.0–0.2)
nRBC: 0 % (ref 0.0–0.2)

## 2024-01-11 LAB — DIFFERENTIAL
Abs Immature Granulocytes: 0.02 10*3/uL (ref 0.00–0.07)
Basophils Absolute: 0 10*3/uL (ref 0.0–0.1)
Basophils Relative: 0 %
Eosinophils Absolute: 0.1 10*3/uL (ref 0.0–0.5)
Eosinophils Relative: 3 %
Immature Granulocytes: 0 %
Lymphocytes Relative: 22 %
Lymphs Abs: 1.1 10*3/uL (ref 0.7–4.0)
Monocytes Absolute: 0.4 10*3/uL (ref 0.1–1.0)
Monocytes Relative: 8 %
Neutro Abs: 3.2 10*3/uL (ref 1.7–7.7)
Neutrophils Relative %: 67 %
Smear Review: DECREASED

## 2024-01-11 LAB — I-STAT CHEM 8, ED
BUN: 18 mg/dL (ref 8–23)
Calcium, Ion: 1.24 mmol/L (ref 1.15–1.40)
Chloride: 103 mmol/L (ref 98–111)
Creatinine, Ser: 0.8 mg/dL (ref 0.44–1.00)
Glucose, Bld: 154 mg/dL — ABNORMAL HIGH (ref 70–99)
HCT: 42 % (ref 36.0–46.0)
Hemoglobin: 14.3 g/dL (ref 12.0–15.0)
Potassium: 3.1 mmol/L — ABNORMAL LOW (ref 3.5–5.1)
Sodium: 141 mmol/L (ref 135–145)
TCO2: 26 mmol/L (ref 22–32)

## 2024-01-11 LAB — CBG MONITORING, ED: Glucose-Capillary: 158 mg/dL — ABNORMAL HIGH (ref 70–99)

## 2024-01-11 LAB — APTT
aPTT: 26 s (ref 24–36)
aPTT: 27 s (ref 24–36)

## 2024-01-11 LAB — ETHANOL: Alcohol, Ethyl (B): <15 mg/dL (ref <15)

## 2024-01-11 MED ORDER — SODIUM CHLORIDE 0.9% FLUSH
3.0000 mL | Freq: Once | INTRAVENOUS | Status: AC
  Administered 2024-01-11: 3 mL via INTRAVENOUS

## 2024-01-11 MED ORDER — NIMODIPINE 30 MG PO CAPS: 60.0000 mg | ORAL_CAPSULE | ORAL | Status: DC

## 2024-01-11 MED ORDER — ONDANSETRON HCL 4 MG/2ML IJ SOLN: 4.0000 mg | Freq: Four times a day (QID) | INTRAMUSCULAR | Status: DC | PRN

## 2024-01-11 MED ORDER — PANTOPRAZOLE SODIUM 40 MG PO TBEC: 40.0000 mg | DELAYED_RELEASE_TABLET | Freq: Every day | ORAL | Status: DC

## 2024-01-11 MED ORDER — ACETAMINOPHEN 160 MG/5ML PO SOLN: 650.0000 mg | ORAL | Status: DC | PRN

## 2024-01-11 MED ORDER — STROKE: EARLY STAGES OF RECOVERY BOOK
Freq: Once | Status: DC
  Filled 2024-01-11: qty 1

## 2024-01-11 MED ORDER — CLEVIDIPINE BUTYRATE 0.5 MG/ML IV EMUL: 0.0000 mg/h | INTRAVENOUS | Status: DC

## 2024-01-11 MED ORDER — GLYCOPYRROLATE 0.2 MG/ML IJ SOLN
0.2000 mg | INTRAMUSCULAR | Status: DC | PRN
  Administered 2024-01-11 – 2024-01-12 (×4): 0.2 mg via INTRAVENOUS
  Filled 2024-01-11 (×4): qty 1

## 2024-01-11 MED ORDER — ONDANSETRON 4 MG PO TBDP: 4.0000 mg | ORAL_TABLET | Freq: Four times a day (QID) | ORAL | Status: DC | PRN

## 2024-01-11 MED ORDER — PANTOPRAZOLE SODIUM 40 MG IV SOLR
40.0000 mg | Freq: Every day | INTRAVENOUS | Status: DC
  Administered 2024-01-11: 40 mg via INTRAVENOUS
  Filled 2024-01-11: qty 10

## 2024-01-11 MED ORDER — NIMODIPINE 6 MG/ML PO SOLN: 60.0000 mg | ORAL | Status: DC

## 2024-01-11 MED ORDER — SODIUM CHLORIDE 0.9 % IV SOLN: INTRAVENOUS | Status: DC

## 2024-01-11 MED ORDER — GLYCOPYRROLATE 0.2 MG/ML IJ SOLN: 0.2000 mg | INTRAMUSCULAR | Status: DC | PRN

## 2024-01-11 MED ORDER — GLYCOPYRROLATE 1 MG PO TABS: 1.0000 mg | ORAL_TABLET | ORAL | Status: DC | PRN

## 2024-01-11 MED ORDER — POLYVINYL ALCOHOL 1.4 % OP SOLN: 1.0000 [drp] | Freq: Four times a day (QID) | OPHTHALMIC | Status: DC | PRN

## 2024-01-11 MED ORDER — ACETAMINOPHEN 650 MG RE SUPP: 650.0000 mg | RECTAL | Status: DC | PRN

## 2024-01-11 MED ORDER — ACETAMINOPHEN 325 MG PO TABS: 650.0000 mg | ORAL_TABLET | ORAL | Status: DC | PRN

## 2024-01-11 MED ORDER — MORPHINE SULFATE (PF) 2 MG/ML IV SOLN
1.0000 mg | INTRAVENOUS | Status: AC | PRN
  Administered 2024-01-11 (×2): 1 mg via INTRAVENOUS
  Filled 2024-01-11 (×2): qty 1

## 2024-01-11 MED ORDER — LABETALOL HCL 5 MG/ML IV SOLN
10.0000 mg | Freq: Once | INTRAVENOUS | Status: AC
  Administered 2024-01-11: 10 mg via INTRAVENOUS

## 2024-01-11 NOTE — Progress Notes (Signed)
 89 year old female collapsed earlier this morning.  Patient in assisted living with clearly stated DNR order.  Patient now unconscious with minimal neurologic function.  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  Given stated wishes an active DNR no intervention appropriate from our standpoint.  No further workup needed.

## 2024-01-11 NOTE — Progress Notes (Signed)
   01/11/24 1656  Spiritual Encounters  Type of Visit Initial  Care provided to: Patient;Pt not available  Referral source Family  Reason for visit End-of-life  OnCall Visit Yes   Chaplain responded to visit a patient who is actively dying. Chaplain was contacted by family through the hospital operator. Chaplain offered prayer and support. Spiritual care services available as needed.   Marion Sicilian, Chaplain 01/11/24

## 2024-01-11 NOTE — Consult Note (Signed)
 NAME:  Sara Logan, MRN:  [id number], DOB:  [birth year], LOS: 0 ADMISSION DATE:  01/11/2024, CONSULTATION DATE:  01/11/2024 REFERRING MD:  Zackowski, Scott , CHIEF COMPLAINT: Found unresponsive  History of Present Illness:  89 year old female with hypertension and hyperlipidemia who lives in assisted living was brought into the emergency department after she was found collapsed on her chair while eating breakfast, in the ED CT head was done which showed diffuse subarachnoid hemorrhage, patient's daughter was called in, she recommended conservative management and proceeding with comfort care, PCCM was consulted for help evaluation medical management  Pertinent  Medical History   Past Medical History:  Diagnosis Date   Arthritis    COVID    Depression    Hyperlipidemia    Osteopenia    Peripheral vascular disease (HCC)    Stroke (HCC)    Thrombocytopenia (HCC)    Vitamin D  deficiency    Wrist fracture 09/10/2000   left     Significant Hospital Events: Including procedures, antibiotic start and stop dates in addition to other pertinent events     Interim History / Subjective:  As above  Objective   Blood pressure (!) 141/63, pulse 72, temperature (!) 96.8 F (36 C), temperature source Temporal, resp. rate (!) 23, height 5\' 3"  (1.6 m), weight 80 kg, SpO2 100%.       No intake or output data in the 24 hours ending 01/11/24 1339 Filed Weights   01/11/24 1000 01/11/24 1124  Weight: 80 kg 80 kg    Examination: General: Acute on chronically ill-appearing female, lying on the bed HEENT: Eyes anicteric.  moist mucus membranes Neuro: Eyes closed, does not open, not following commands, flicker response to painful stimuli on left side.  Pupils unequal sluggishly reactive to light Chest: Coarse breath sounds, no wheezes or rhonchi Heart: Regular rate and rhythm, no murmurs or gallops Abdomen: Soft, nontender, nondistended, bowel sounds present Skin: No rash  Labs and images  reviewed Resolved Hospital Problem list     Assessment & Plan:  Aneurysmal subarachnoid hemorrhage H/H4, mF4 probably due to ruptured left MCA aneurysm Left frontal intraparenchymal hemorrhage Acute encephalopathy in the setting of intracranial bleed Hypertension Hyperlipidemia Hypokalemia Thrombocytopenia  Patient with devastating intraparenchymal hemorrhage and subarachnoid hemorrhage Spoke with patient's daughter she stated that patient would not want aggressive measures, she would like to be DNR/DNI and comfort care only Comfort care orders were written Hospitalist were requested to admit the patient under their service  PCCM will sign off, please call with questions  Best Practice (right click and "Reselect all SmartList Selections" daily)   Per primary team  Labs   CBC: Recent Labs  Lab 01/11/24 1039 01/11/24 1052 01/11/24 1216  WBC 4.8  --  6.0  NEUTROABS 3.2  --   --   HGB 13.0 14.3 12.2  HCT 39.3 42.0 37.4  MCV 94.0  --  94.0  PLT 95*  --  86*    Basic Metabolic Panel: Recent Labs  Lab 01/11/24 1039 01/11/24 1052  NA 141 141  K 3.1* 3.1*  CL 102 103  CO2 24  --   GLUCOSE 158* 154*  BUN 17 18  CREATININE 0.82 0.80  CALCIUM  10.1  --    GFR: Estimated Creatinine Clearance: 47.7 mL/min (by C-G formula based on SCr of 0.8 mg/dL). Recent Labs  Lab 01/11/24 1039 01/11/24 1216  WBC 4.8 6.0    Liver Function Tests: Recent Labs  Lab 01/11/24 1039  AST 31  ALT 17  ALKPHOS 58  BILITOT 1.6*  PROT 7.5  ALBUMIN  4.2   No results for input(s): "LIPASE", "AMYLASE" in the last 168 hours. No results for input(s): "AMMONIA" in the last 168 hours.  ABG    Component Value Date/Time   TCO2 26 01/11/2024 1052     Coagulation Profile: Recent Labs  Lab 01/11/24 1039 01/11/24 1216  INR 1.0 1.1    Cardiac Enzymes: No results for input(s): "CKTOTAL", "CKMB", "CKMBINDEX", "TROPONINI" in the last 168 hours.  HbA1C: Hgb A1c MFr Bld  Date/Time  Value Ref Range Status  09/26/2015 03:25 AM 5.9 (H) 4.8 - 5.6 % Final    Comment:    (NOTE)         Pre-diabetes: 5.7 - 6.4         Diabetes: >6.4         Glycemic control for adults with diabetes: <7.0   04/22/2015 05:13 PM 5.9 (H) <5.7 % Final    Comment:                                                                           According to the ADA Clinical Practice Recommendations for 2011, when HbA1c is used as a screening test:     >=6.5%   Diagnostic of Diabetes Mellitus            (if abnormal result is confirmed)   5.7-6.4%   Increased risk of developing Diabetes Mellitus   References:Diagnosis and Classification of Diabetes Mellitus,Diabetes Care,2011,34(Suppl 1):S62-S69 and Standards of Medical Care in         Diabetes - 2011,Diabetes Care,2011,34 (Suppl 1):S11-S61.       CBG: Recent Labs  Lab 01/11/24 1044  GLUCAP 158*    Review of Systems:   Unable to obtain as patient is obtunded  Past Medical History:  She,  has a past medical history of Arthritis, COVID, Depression, Hyperlipidemia, Osteopenia, Peripheral vascular disease (HCC), Stroke (HCC), Thrombocytopenia (HCC), Vitamin D  deficiency, and Wrist fracture (09/10/2000).   Surgical History:   Past Surgical History:  Procedure Laterality Date   APPENDECTOMY  1967   CARPAL TUNNEL RELEASE  2008   right   COLON RESECTION SIGMOID N/A 12/03/2020   Procedure: EXPLORATORY LAPAROTOMY; HARTMAN'S PROCEDURE; SPLEENIC FLEXURE MOBILIZATION;  Surgeon: Joyce Nixon, MD;  Location: [facility];  Service: General;  Laterality: N/A;   COLOSTOMY N/A 12/03/2020   Procedure: COLOSTOMY;  Surgeon: Joyce Nixon, MD;  Location: [facility];  Service: General;  Laterality: N/A;   JOINT REPLACEMENT  2002   right total      Social History:   reports that she has never smoked. She has never used smokeless tobacco. She reports that she does not drink alcohol and does not use drugs.   Family History:  Her family history includes  Arthritis in her son; Breast cancer in her mother and sister; Crohn's disease in her daughter; Other in her son; Ovarian cancer in her daughter. There is no history of Colon cancer or Esophageal cancer.   Allergies Allergies  Allergen Reactions   Bactrim  [Sulfamethoxazole -Trimethoprim ] Nausea Only   Ditropan  [Oxybutynin ]     Made patient feel bad   Sulfa  Antibiotics     Other  reaction(s): stomach upset     Home Medications  Prior to Admission medications   Medication Sig Start Date End Date Taking? Authorizing Provider  acetaminophen  (TYLENOL ) 325 MG tablet Take 650 mg by mouth 3 (three) times daily. SCHEDULED, See as needed dosing also   Yes [provider]  acetaminophen  (TYLENOL ) 325 MG tablet Take 325 mg by mouth every 6 (six) hours as needed for fever.   Yes [provider]  Alum & Mag Hydroxide-Simeth (ANTACID & ANTIGAS PO) Take 30 mLs by mouth every 4 (four) hours as needed. for Indigestion   Yes [provider]  aspirin  EC 81 MG tablet Take 81 mg by mouth daily. Swallow whole.   Yes [provider]  calcium  carbonate (OSCAL) 1500 (600 Ca) MG TABS tablet Take 600 mg of elemental calcium  by mouth daily.   Yes [provider]  Cholecalciferol  (VITAMIN D3) 2000 units capsule Take 2,000 Units by mouth daily.   Yes [provider]  docusate sodium  (COLACE) 100 MG capsule Take 100 mg by mouth daily.   Yes [provider]  fluticasone (FLONASE) 50 MCG/ACT nasal spray Place 1 spray into both nostrils 2 (two) times daily.   Yes [provider]  furosemide (LASIX) 20 MG tablet Take 40 mg by mouth daily. 09/03/23  Yes [provider]  gabapentin (NEURONTIN) 100 MG capsule Take 100 mg by mouth daily as needed (for restless leg syndrome).   Yes [provider]  loperamide (IMODIUM A-D) 2 MG tablet Take 2 mg by mouth as needed for diarrhea or loose stools.   Yes [provider]  Magnesium  300 MG CAPS  Take 300 mg by mouth daily.   Yes [provider]  magnesium  hydroxide (MILK OF MAGNESIA) 400 MG/5ML suspension Take 30 mLs by mouth daily as needed for mild constipation.   Yes [provider]  metoprolol  tartrate (LOPRESSOR ) 25 MG tablet Take 25 mg by mouth 2 (two) times daily.   Yes [provider]  ondansetron  (ZOFRAN ) 4 MG tablet Take 4 mg by mouth every 6 (six) hours as needed for vomiting or nausea. 09/06/23  Yes [provider]  pantoprazole  (PROTONIX ) 40 MG tablet Take 40 mg by mouth daily.   Yes [provider]  polyethylene glycol (MIRALAX  / GLYCOLAX ) 17 g packet Take 17 g by mouth daily.   Yes [provider]  potassium chloride  SA (KLOR-CON ) 20 MEQ tablet Take 20 mEq by mouth daily.   Yes [provider]  Propylene Glycol (SYSTANE BALANCE OP) Apply 1 drop to eye every evening. related to PRESENCE OF INTRAOCULAR LENS (Z96.1) wait 10 minutes between Systane and timolol drops   Yes [provider]  rosuvastatin  (CRESTOR ) 10 MG tablet Take 10 mg by mouth at bedtime.   Yes [provider]  Sennosides (SENNA) 8.6 MG CAPS Take one to two capsules as needed at bedtime If no bowel movements in 3 days 08/20/23  Yes Elois Hair, MD  timolol (TIMOPTIC) 0.5 % ophthalmic solution Place 1 drop into the left eye 2 (two) times daily. related to PRESENCE OF INTRAOCULAR LENS (Z96.1) Wait 10 minutes between Systane and timolol drops   Yes [provider]  venlafaxine  XR (EFFEXOR -XR) 75 MG 24 hr capsule TAKE 2 CAPSULES (150MG ) BY MOUTH DAILY. 12/05/20  Yes Burchette, Marijean Shouts, MD  vitamin B-12 (CYANOCOBALAMIN) 1000 MCG tablet Take 1,000 mcg by mouth daily.   Yes [provider]  simethicone  (MYLICON) 80 MG chewable tablet  Chew 1 tablet (80 mg total) by mouth 4 (four) times daily. Patient taking differently: Chew 80 mg by mouth 4 (four) times daily as needed for flatulence. 06/22/23   Medina-Vargas, Monina  C, NP     Critical care time:      The patient is critically ill due to subarachnoid hemorrhage/intraparenchymal hemorrhage.  Critical care was necessary to treat or prevent imminent or life-threatening deterioration.  Critical care was time spent personally by me on the following activities: development of treatment plan with patient and/or surrogate as well as nursing, discussions with consultants, evaluation of patient's response to treatment, examination of patient, obtaining history from patient or surrogate, ordering and performing treatments and interventions, ordering and review of laboratory studies, ordering and review of radiographic studies, pulse oximetry, re-evaluation of patient's condition and participation in multidisciplinary rounds.   During this encounter critical care time was devoted to patient care services described in this note for 35 minutes.     Trevor Fudge, MD [facility] See Amion for pager If no response to pager, please call [phone] until 7pm After 7pm, Please call E-link [phone]

## 2024-01-11 NOTE — ED Notes (Signed)
Returned to ED from CT.  ?

## 2024-01-11 NOTE — ED Notes (Signed)
 TO CT

## 2024-01-11 NOTE — ED Notes (Signed)
 CCMD called aware patient is going upstairs and floor called to make aware she is coming.

## 2024-01-11 NOTE — ED Notes (Signed)
CC MD at bedside 

## 2024-01-11 NOTE — Consult Note (Signed)
 NEUROLOGY CONSULT NOTE   Date of service: Jan 11, 2024 Patient Name: Sara Logan MRN:  [id number] DOB:  1932-11-27 Chief Complaint: Abrupt onset of decreased responsivness Requesting Provider: Zackowski, Scott, MD  History of Present Illness  Sara Logan is an 89 y.o. female resident of an assisted living facility with a PMHx of arthritis, Covid, depression, HLD, osteopenia, PVD, stroke, thrombocytopenia and vitamin D  deficiency who presents to the ED via EMS after sudden onset of unresponsiveness. LKN was 0720 when she was seen walking at her facility. She was later found in a chair with decreased responsiveness and leaning to the right, with food still in her mouth. On EMS arrival, she continued to be poorly responsive. GCS was 7. BP was 250/120 and HR varied between 40 and 120. O2 by mask was started and HR stabilized to 80. CBG was 120. She continued to be unarousable en route to the ED. After arrival to the ED BVM was removed and her respirations were stable. She continued to be unarousable.  She is on ASA at her facility. Per family and records brought from her facility, she is DNR/DNI.   LKW: 0720 Modified rankin score: 2-Slight disability-UNABLE to perform all activities but does not need assistance Hunt and Hess Score: 4 (approximate 80% mortality rate)   ROS  Unable to ascertain due to unresponsiveness.   Past History   Past Medical History:  Diagnosis Date   Arthritis    COVID    Depression    Hyperlipidemia    Osteopenia    Peripheral vascular disease (HCC)    Stroke (HCC)    Thrombocytopenia (HCC)    Vitamin D  deficiency    Wrist fracture 09/10/2000   left    Past Surgical History:  Procedure Laterality Date   APPENDECTOMY  1967   CARPAL TUNNEL RELEASE  2008   right   COLON RESECTION SIGMOID N/A 12/03/2020   Procedure: EXPLORATORY LAPAROTOMY; HARTMAN'S PROCEDURE; SPLEENIC FLEXURE MOBILIZATION;  Surgeon: Joyce Nixon, MD;  Location: [facility];  Service:  General;  Laterality: N/A;   COLOSTOMY N/A 12/03/2020   Procedure: COLOSTOMY;  Surgeon: Joyce Nixon, MD;  Location: [facility];  Service: General;  Laterality: N/A;   JOINT REPLACEMENT  2002   right total     Family History: Family History  Problem Relation Age of Onset   Breast cancer Mother    Breast cancer Sister    Other Son        unsure of cause   Arthritis Son    Ovarian cancer Daughter    Crohn's disease Daughter    Colon cancer Neg Hx    Esophageal cancer Neg Hx     Social History  reports that she has never smoked. She has never used smokeless tobacco. She reports that she does not drink alcohol and does not use drugs.  Allergies  Allergen Reactions   Bactrim  [Sulfamethoxazole -Trimethoprim ] Nausea Only   Ditropan  [Oxybutynin ]     Made patient feel bad   Sulfa  Antibiotics     Other reaction(s): stomach upset    Medications   Current Facility-Administered Medications:    sodium chloride  flush (NS) 0.9 % injection 3 mL, 3 mL, Intravenous, Once, Zackowski, Scott, MD  Current Outpatient Medications:    acetaminophen  (TYLENOL ) 325 MG tablet, Take 650 mg by mouth 3 (three) times daily. SCHEDULED, See as needed dosing also, Disp: , Rfl:    Alum & Mag Hydroxide-Simeth (ANTACID & ANTIGAS PO), Take 30 mLs by mouth every  4 (four) hours as needed. for Indigestion, Disp: , Rfl:    amLODipine  (NORVASC ) 5 MG tablet, Take 5 mg by mouth daily. (Patient not taking: Reported on 11/08/2023), Disp: , Rfl:    aspirin  EC 81 MG tablet, Take 81 mg by mouth daily. Swallow whole., Disp: , Rfl:    calcium  carbonate (OSCAL) 1500 (600 Ca) MG TABS tablet, Take 600 mg of elemental calcium  by mouth daily., Disp: , Rfl:    Cholecalciferol  (VITAMIN D3) 2000 units capsule, Take 2,000 Units by mouth daily., Disp: , Rfl:    diclofenac Sodium (VOLTAREN ARTHRITIS PAIN) 1 % GEL, Apply 2 g topically 3 (three) times daily as needed (Left wrist). (Patient not taking: Reported on 11/08/2023), Disp: , Rfl:     fluticasone (FLONASE) 50 MCG/ACT nasal spray, Place 1 spray into both nostrils 2 (two) times daily., Disp: , Rfl:    furosemide (LASIX) 20 MG tablet, Take 40 mg by mouth daily., Disp: , Rfl:    loperamide (IMODIUM A-D) 2 MG tablet, Take 2 mg by mouth as needed for diarrhea or loose stools., Disp: , Rfl:    LORazepam (ATIVAN) 0.5 MG tablet, Take 0.5 mg by mouth every 8 (eight) hours as needed for anxiety. (Patient not taking: Reported on 12/05/2023), Disp: , Rfl:    Magnesium  300 MG CAPS, Take 300 mg by mouth daily., Disp: , Rfl:    magnesium  hydroxide (MILK OF MAGNESIA) 400 MG/5ML suspension, Take 30 mLs by mouth daily as needed for mild constipation., Disp: , Rfl:    metoprolol  tartrate (LOPRESSOR ) 25 MG tablet, Take 25 mg by mouth 2 (two) times daily., Disp: , Rfl:    minoxidil (ROGAINE) 2 % external solution, Apply topically daily as needed. (Patient not taking: Reported on 11/08/2023), Disp: , Rfl:    ondansetron  (ZOFRAN ) 4 MG tablet, Take 4 mg by mouth every 6 (six) hours as needed for vomiting or nausea., Disp: , Rfl:    pantoprazole  (PROTONIX ) 40 MG tablet, Take 40 mg by mouth daily., Disp: , Rfl:    polyethylene glycol (MIRALAX  / GLYCOLAX ) 17 g packet, Take 17 g by mouth 3 (three) times daily., Disp: , Rfl:    potassium chloride  SA (KLOR-CON ) 20 MEQ tablet, Take 20 mEq by mouth daily., Disp: , Rfl:    Propylene Glycol (SYSTANE BALANCE OP), Apply 1 drop to eye every evening. related to PRESENCE OF INTRAOCULAR LENS (Z96.1) wait 10 minutes between Systane and timolol drops, Disp: , Rfl:    rosuvastatin  (CRESTOR ) 10 MG tablet, Take 10 mg by mouth at bedtime., Disp: , Rfl:    Sennosides (SENNA) 8.6 MG CAPS, Take one to two capsules as needed at bedtime If no bowel movements in 3 days, Disp: , Rfl:    simethicone  (MYLICON) 80 MG chewable tablet, Chew 1 tablet (80 mg total) by mouth 4 (four) times daily., Disp: , Rfl:    timolol (TIMOPTIC) 0.5 % ophthalmic solution, Place 1 drop into the left eye 2  (two) times daily. related to PRESENCE OF INTRAOCULAR LENS (Z96.1) Wait 10 minutes between Systane and timolol drops, Disp: , Rfl:    venlafaxine  XR (EFFEXOR -XR) 75 MG 24 hr capsule, TAKE 2 CAPSULES (150MG ) BY MOUTH DAILY., Disp: 60 capsule, Rfl: 5   vitamin B-12 (CYANOCOBALAMIN) 1000 MCG tablet, Take 1,000 mcg by mouth daily., Disp: , Rfl:   Vitals   Vitals:   01/11/24 1000  Weight: 80 kg    Body mass index is 31.24 kg/m.  Physical Exam   Physical  Exam  HEENT:  [place]/AT. No neck stiffness Lungs: Being bagged at the time of arrival to the ED,  Extremities: Warm and well perfused.   Neurological Examination Mental Status: Obtunded with GCS 5. Not following any commands. No vocal output. No eye opening spontaneously or to noxious stimuli. Moves limbs nonpurposefully to noxious, left more than right.   Cranial Nerves: II: No blink to threat bilaterally. Left pupil 4 mm, irregular and sluggishly reactive. Right pupil round, 2 mm and sluggishly reactive.    III,IV, VI: Eyes are midline without forced gaze deviation. Absent doll's eye reflex.   V: Weak corneal reflexes bilaterally VII: Upon observing her breathing, air escapes through right side of mouth which has decreased tone VIII: No response to voice IX,X: Gag reflex deferred.  XI: Unable to assess XII: Unable to assess Motor/Sensory: Does not move to command.  LUE moves weakly and nonpurposefully to noxious pinch and sternal rub.  RUE with minimal to no movement to pinch LLE with brisk withdrawal to noxious plantar stimulation RLE with sluggish withdrawal to noxious plantar stimulation Deep Tendon Reflexes: Unable to elicit patellars due to arthritis and prior operation. 1+ bilateral brachioradialis. Toes equivocal  Cerebellar: Unable to assess Gait: Unable to assess   Labs/Imaging/Neurodiagnostic studies   CBC: No results for input(s): "WBC", "NEUTROABS", "HGB", "HCT", "MCV", "PLT" in the last 168 hours. Basic Metabolic  Panel:  Lab Results  Component Value Date   NA 140 11/07/2023   K 3.4 (A) 11/07/2023   CO2 28 (A) 11/07/2023   GLUCOSE 116 (H) 12/14/2020   BUN 18 11/07/2023   CREATININE 0.8 11/07/2023   CALCIUM  9.3 11/07/2023   GFRNONAA 81 01/24/2021   GFRAA 94 01/24/2021   Lipid Panel:  Lab Results  Component Value Date   LDLCALC 58 05/30/2023   HgbA1c:  Lab Results  Component Value Date   HGBA1C 5.9 (H) 09/26/2015   Urine Drug Screen: No results found for: "LABOPIA", "COCAINSCRNUR", "LABBENZ", "AMPHETMU", "THCU", "LABBARB"  Alcohol Level No results found for: "ETH" INR  Lab Results  Component Value Date   INR 1.3 (H) 12/03/2020   APTT  Lab Results  Component Value Date   APTT 27 09/25/2015     ASSESSMENT  89 year old female resident of an assisted living facility with a PMHx of arthritis, Covid, depression, HLD, osteopenia, PVD, stroke, thrombocytopenia and vitamin D  deficiency who presents to the ED via EMS after sudden onset of unresponsiveness. LKN was 0720 when she was seen walking at her facility. She was later found in a chair with decreased responsiveness and leaning to the right, with food still in her mouth. On EMS arrival, she continued to be poorly responsive. GCS was 7. BP was 250/120 and HR varied between 40 and 120. O2 by mask was started and HR stabilized to 80. CBG was 120. She continued to be unarousable en route to the ED. After arrival to the ED BVM was removed and her respirations were stable. She continued to be unarousable. She is on ASA at her facility. Per family and records brought from her facility, she is DNR/DNI.  - CT head: Extensive subarachnoid hemorrhage bilaterally and extends into the basal cisterns and inferiorly surrounding the anterior brainstem and upper cervical spine. Parenchymal hemorrhage inferior to the left basal ganglia measures 2.5 x 2.6 x 2.1 cm. Trace blood in the posterior horn of the right lateral ventricle. 6 mm of left to right midline  shift. Downward herniation of the cerebellar tonsils. Extensive  white matter disease likely reflects the sequela of chronic microvascular ischemia. - Exam reveals severe deficits consistent with the imaging findings.  - Hunt and Hess score of 4 carries with it an approximate 80% mortality rate, which is most likely an underestimate for this patient given the widespread SAH.   RECOMMENDATIONS  - Will need to be admitted for BP management and palliative care consult - Spoke with family by telephone and discussed with them her dismal prognosis. They are amenable to conservative medical management with BP control and supportive care. They reiterated that they would like her to continue to be DNR/DNI. All questions answered.  - No indication for neurosurgical intervention as it would be futile.  - Family states that the first family members will be able to arrive here by tomorrow ______________________________________________________________________    Hope Ly, Amri Lien, MD [facility]

## 2024-01-11 NOTE — Progress Notes (Signed)
 Could not complete admission questions because I could not get in touch with patient's family and patient is unresponsive.

## 2024-01-11 NOTE — ED Notes (Signed)
 CCM and neuro at bedside both had spoken to patient daughter and patient is now comfort care.

## 2024-01-11 NOTE — Progress Notes (Signed)
 Patient arrived to room as comfort care, but had lots of additional orders that were not consistant with comfort care, so I secure chatted Dr. Brunilda Capra and she told me d/c noncomfort orders. Dr. Lydia Sams also stated that she was calling the patient's daughter.

## 2024-01-11 NOTE — Code Documentation (Signed)
 Stroke Response Nurse Documentation Code Documentation  Sara Logan is a 89 y.o. female arriving to [facility]  via [facility] on 01-11-2024 with past medical hx of HTN & CVA. On aspirin  81 mg daily. Code stroke was activated by EMS.   Patient from Assisted Living facility where she was LKW at 0720 and now complaining of aphasia and right facial droop.  Per EMS staff states that she is normally ambulatory and conversant.  She "slumped over" this morning and EMS was called.    Stroke team at the bedside on patient arrival. Labs drawn and patient cleared for CT by Dr. Zackowski.  Patient arrived unresponsive with EMS assisting with ambubag.  Taken to ED room for airway assessment.  Patient breathing easily and able to oxygenate well on 2L [place].  Patient with DNR/DNI status, confirmed with daughter.   Patient to CT with team. NIHSS 29, see documentation for details and code stroke times. Patient with decreased LOC, disoriented, not following commands, bilateral hemianopia, right facial droop, bilateral arm weakness, bilateral leg weakness, Global aphasia , dysarthria , and Visual  neglect on exam. The following imaging was completed:  CT Head. Patient is not a candidate for IV Thrombolytic due to subarachnoid hemorrhage. Patient  a candidate for IR due to no LVO suspected.   10 mg Labetalol  given for BP 204/78 Cleviprex started to maintain SBP 130-150  Care Plan: VS and NIHSS q 1 hour  Bedside handoff with ED RN Gustabo Legions. After neurosurgery consultation and additional conversation with family.  Patient's code status changed to DNR-Comfort measures.  Cleviprex d/c'd    Waldemar Guillaume  Stroke Response RN

## 2024-01-11 NOTE — ED Provider Notes (Signed)
 [place] EM[facility]GENCY DEPARTMENT AT [facility] Provider Note   CSN: [id number] Arrival date & time: 01/11/24  1036     History  No chief complaint on file.   Sara Logan is a 89 y.o. female.  Patient brought in by EMS from friend's home.  Patient was in assisted living there.  Patient was up and about everything was fine this morning and then she collapsed.  EMS was assisting breathing.  Upon arrival here patient had some movement of the left side minimal to no movement of the right side left pupil was bigger than the right left pupil was probably about 4 mm.  Patient was completely unresponsive.  Was planning on intubation however patient is a DNR.  We did confirm with her daughter that she is a DNR to include intubation.       Home Medications Prior to Admission medications   Medication Sig Start Date End Date Taking? Authorizing Provider  acetaminophen  (TYLENOL ) 325 MG tablet Take 650 mg by mouth 3 (three) times daily. SCHEDULED, See as needed dosing also    [provider]  Alum & Mag Hydroxide-Simeth (ANTACID & ANTIGAS PO) Take 30 mLs by mouth every 4 (four) hours as needed. for Indigestion    [provider]  amLODipine  (NORVASC ) 5 MG tablet Take 5 mg by mouth daily. Patient not taking: Reported on 11/08/2023    [provider]  aspirin  EC 81 MG tablet Take 81 mg by mouth daily. Swallow whole.    [provider]  calcium  carbonate (OSCAL) 1500 (600 Ca) MG TABS tablet Take 600 mg of elemental calcium  by mouth daily.    [provider]  Cholecalciferol  (VITAMIN D3) 2000 units capsule Take 2,000 Units by mouth daily.    [provider]  diclofenac Sodium (VOLTAREN ARTHRITIS PAIN) 1 % GEL Apply 2 g topically 3 (three) times daily as needed (Left wrist). Patient not taking: Reported on 11/08/2023    [provider]  fluticasone (FLONASE) 50 MCG/ACT nasal spray Place 1 spray into both nostrils 2 (two) times  daily.    [provider]  furosemide (LASIX) 20 MG tablet Take 40 mg by mouth daily. 09/03/23   [provider]  loperamide (IMODIUM A-D) 2 MG tablet Take 2 mg by mouth as needed for diarrhea or loose stools.    [provider]  LORazepam (ATIVAN) 0.5 MG tablet Take 0.5 mg by mouth every 8 (eight) hours as needed for anxiety. Patient not taking: Reported on 12/05/2023    [provider]  Magnesium  300 MG CAPS Take 300 mg by mouth daily.    [provider]  magnesium  hydroxide (MILK OF MAGNESIA) 400 MG/5ML suspension Take 30 mLs by mouth daily as needed for mild constipation.    [provider]  metoprolol  tartrate (LOPRESSOR ) 25 MG tablet Take 25 mg by mouth 2 (two) times daily.    [provider]  minoxidil (ROGAINE) 2 % external solution Apply topically daily as needed. Patient not taking: Reported on 11/08/2023    [provider]  ondansetron  (ZOFRAN ) 4 MG tablet Take 4 mg by mouth every 6 (six) hours as needed for vomiting or nausea. 09/06/23   [provider]  pantoprazole  (PROTONIX ) 40 MG tablet Take 40 mg by mouth daily.    [provider]  polyethylene glycol (MIRALAX  / GLYCOLAX ) 17 g packet Take 17 g by mouth 3 (three) times daily.    [provider]  potassium chloride   SA (KLOR-CON ) 20 MEQ tablet Take 20 mEq by mouth daily.    [provider]  Propylene Glycol (SYSTANE BALANCE OP) Apply 1 drop to eye every evening. related to PRESENCE OF INTRAOCULAR LENS (Z96.1) wait 10 minutes between Systane and timolol drops    [provider]  rosuvastatin  (CRESTOR ) 10 MG tablet Take 10 mg by mouth at bedtime.    [provider]  Sennosides (SENNA) 8.6 MG CAPS Take one to two capsules as needed at bedtime If no bowel movements in 3 days 08/20/23   Elois Hair, MD  simethicone  (MYLICON) 80 MG chewable tablet Chew 1 tablet (80 mg total) by mouth 4 (four) times daily.  06/22/23   Medina-Vargas, Monina C, NP  timolol (TIMOPTIC) 0.5 % ophthalmic solution Place 1 drop into the left eye 2 (two) times daily. related to PRESENCE OF INTRAOCULAR LENS (Z96.1) Wait 10 minutes between Systane and timolol drops    [provider]  venlafaxine  XR (EFFEXOR -XR) 75 MG 24 hr capsule TAKE 2 CAPSULES (150MG ) BY MOUTH DAILY. 12/05/20   Burchette, Marijean Shouts, MD  vitamin B-12 (CYANOCOBALAMIN) 1000 MCG tablet Take 1,000 mcg by mouth daily.    [provider]      Allergies    Bactrim  [sulfamethoxazole -trimethoprim ], Ditropan  [oxybutynin ], and Sulfa  antibiotics    Review of Systems   Review of Systems  Unable to perform ROS: Patient unresponsive    Physical Exam Updated Vital Signs Wt 80 kg   BMI 31.24 kg/m  Physical Exam Vitals and nursing note reviewed.  Constitutional:      General: She is in acute distress.     Appearance: She is well-developed.  HENT:     Head: Normocephalic and atraumatic.  Eyes:     Conjunctiva/sclera: Conjunctivae normal.     Comments: Left pupil 4 mm right pupil 2 mm.  Cardiovascular:     Rate and Rhythm: Normal rate and regular rhythm.     Heart sounds: No murmur heard. Pulmonary:     Effort: Pulmonary effort is normal. No respiratory distress.     Breath sounds: Normal breath sounds. No wheezing or rales.  Abdominal:     General: There is no distension.     Palpations: Abdomen is soft.     Tenderness: There is no abdominal tenderness.  Musculoskeletal:        General: No swelling.     Cervical back: Neck supple.  Skin:    General: Skin is warm and dry.     Capillary Refill: Capillary refill takes less than 2 seconds.  Neurological:     Mental Status: She is alert.     Comments: Patient completely unresponsive.  Minimal movement on the right side pretty good movement on the left side spontaneously.  Psychiatric:        Mood and Affect: Mood normal.     ED Results / Procedures / Treatments   Labs (all labs  ordered are listed, but only abnormal results are displayed) Labs Reviewed  I-STAT CHEM 8, ED - Abnormal; Notable for the following components:      Result Value   Potassium 3.1 (*)    Glucose, Bld 154 (*)    All other components within normal limits  PROTIME-INR  APTT  CBC  DIFFERENTIAL  COMPREHENSIVE METABOLIC PANEL WITH GFR  ETHANOL  CBG MONITORING, ED    EKG None  Radiology No results found.  Procedures Procedures    Medications Ordered in ED Medications  sodium chloride  flush (  NS) 0.9 % injection 3 mL (has no administration in time range)  labetalol  (NORMODYNE ) injection 10 mg (has no administration in time range)  clevidipine (CLEVIPREX) infusion 0.5 mg/mL (has no administration in time range)    ED Course/ Medical Decision Making/ A&P                                 Medical Decision Making Amount and/or Complexity of Data Reviewed Labs: ordered. Radiology: ordered.  CRITICAL CARE Performed by: Jacque Garrels Total critical care time: 45 minutes Critical care time was exclusive of separately billable procedures and treating other patients. Critical care was necessary to treat or prevent imminent or life-threatening deterioration. Critical care was time spent personally by me on the following activities: development of treatment plan with patient and/or surrogate as well as nursing, discussions with consultants, evaluation of patient's response to treatment, examination of patient, obtaining history from patient or surrogate, ordering and performing treatments and interventions, ordering and review of laboratory studies, ordering and review of radiographic studies, pulse oximetry and re-evaluation of patient's condition.  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  Will discuss with neurosurgery Dr. Lindzen from neurology already involved.  They are recommending that she get started on Lopressor  and Clavapin that would tend to dictate ICU  admission even though she is a DNR.  Most likely is going to need just comfort care.  Will consult neurosurgery.  Final Clinical Impression(s) / ED Diagnoses Final diagnoses:  None    Rx / DC Orders ED Discharge Orders     None         Nicklas Barns, MD 01/11/24 1103

## 2024-01-11 NOTE — Progress Notes (Signed)
 Spoke with patient's daughter, Mckinzy Minelli, on the phone. She stated that she had talked with 2 doctors today and she knew that her mother was comfort care. She stated that she lives 5 hours away and would be coming to visit her mother tomorrow afternoon.

## 2024-01-11 NOTE — ED Notes (Signed)
 Upper Denture removed labeled and placed in denture cup.

## 2024-01-11 NOTE — ED Triage Notes (Signed)
 Patient presents to ed via [facility] states patient was walking and alert and oriented x 4 at her normal baseline, LNW 0720 was found by staff sitting in her chair slumped over unresp. Per ems she had been eating upon their arrival she had food in her mouth and had to be suctioned , she did move all ext to painful stimuli per ems. Resp rate was 6 ems did blow by with BVM and sats increased. Right pupil 2 and sluggish, left pupil 4 irregular.  Patient daughter Keon Waltermire was called [phone] daughter spoke with Dr. Renaee Caro and stated patient was DNR/DNI.

## 2024-01-11 NOTE — H&P (Signed)
 History and Physical    Patient: Sara Logan ZOX:[id number] DOB: [birth year] DOA: 01/11/2024 DOS: the patient was seen and examined on 01/11/2024 PCP: Tye Gall, MD  Patient coming from: SN[facility] Chief complaint: Chief Complaint  Patient presents with   Code Stroke   HPI:  Sara Logan is a 89 y.o. female with past medical history  of essential hypertension stroke, falls, history depression, brought by EMS for altered mental status, patient's LNW 0720 was found by staff sitting in her chair slumped over unresp, ED MD note states patient was up and about and she collapsed, HPI is unclear and per documentation as patient is unresponsive and comatose.  No family at bedside pt was initially seen and managed by icu and neurology and hospitalist asked to admit for comfort measure, no family at bedside.  Per ED MD note family was contacted, patient's daughter does understand patient's terminal prognosis and patient is made comfort measures.  ED Course: Pt in ed at bedside  is unresponsive. Vital signs in the ED were notable for the following:  Vitals:   01/11/24 1130 01/11/24 1145 01/11/24 1200 01/11/24 1341  BP: (!) 132/59 138/63 (!) 141/63 (!) 185/83  Pulse: 71 71 72 68  Temp:      Resp: (!) 27 18 (!) 23 18  Height:      Weight:      SpO2: 100% 100% 100% 100%  TempSrc:      BMI (Calculated):      >>ED evaluation thus far shows: Patient arrived as a code stroke and a stat head CT showed extensive subarachnoid hemorrhage bilaterally extending into the anterior brainstem and upper cervical spine parenchymal hemorrhage, downward herniation of the cerebellar tonsils.  >>While in the ED patient received the following: Medications   stroke: early stages of recovery book (has no administration in time range)  0.9 %  sodium chloride  infusion ( Intravenous New Bag/Given 01/11/24 1220)  acetaminophen  (TYLENOL ) tablet 650 mg (has no administration in time range)    Or  acetaminophen   (TYLENOL ) 160 MG/5ML solution 650 mg (has no administration in time range)    Or  acetaminophen  (TYLENOL ) suppository 650 mg (has no administration in time range)  glycopyrrolate (ROBINUL) tablet 1 mg (has no administration in time range)    Or  glycopyrrolate (ROBINUL) injection 0.2 mg (has no administration in time range)    Or  glycopyrrolate (ROBINUL) injection 0.2 mg (has no administration in time range)  polyvinyl alcohol (LIQUIFILM TEARS) 1.4 % ophthalmic solution 1 drop (has no administration in time range)  sodium chloride  flush (NS) 0.9 % injection 3 mL (3 mLs Intravenous Given 01/11/24 1105)  labetalol  (NORMODYNE ) injection 10 mg (10 mg Intravenous Given 01/11/24 1105)   Review of Systems  Unable to perform ROS: Patient unresponsive   Past Medical History:  Diagnosis Date   Arthritis    COVID    Depression    Hyperlipidemia    Osteopenia    Peripheral vascular disease (HCC)    Stroke (HCC)    Thrombocytopenia (HCC)    Vitamin D  deficiency    Wrist fracture 09/10/2000   left   Past Surgical History:  Procedure Laterality Date   APPENDECTOMY  1967   CARPAL TUNNEL RELEASE  2008   right   COLON RESECTION SIGMOID N/A 12/03/2020   Procedure: EXPLORATORY LAPAROTOMY; HARTMAN'S PROCEDURE; SPLEENIC FLEXURE MOBILIZATION;  Surgeon: Joyce Nixon, MD;  Location: [facility];  Service: General;  Laterality: N/A;   COLOSTOMY N/A 12/03/2020  Procedure: COLOSTOMY;  Surgeon: Joyce Nixon, MD;  Location: [facility];  Service: General;  Laterality: N/A;   JOINT REPLACEMENT  2002   right total     reports that she has never smoked. She has never used smokeless tobacco. She reports that she does not drink alcohol and does not use drugs. Allergies  Allergen Reactions   Bactrim  [Sulfamethoxazole -Trimethoprim ] Nausea Only   Ditropan  [Oxybutynin ]     Made patient feel bad   Sulfa  Antibiotics     Other reaction(s): stomach upset   Family History  Problem Relation Age of Onset   Breast cancer  Mother    Breast cancer Sister    Other Son        unsure of cause   Arthritis Son    Ovarian cancer Daughter    Crohn's disease Daughter    Colon cancer Neg Hx    Esophageal cancer Neg Hx    Prior to Admission medications   Medication Sig Start Date End Date Taking? Authorizing Provider  acetaminophen  (TYLENOL ) 325 MG tablet Take 650 mg by mouth 3 (three) times daily. SCHEDULED, See as needed dosing also   Yes [provider]  acetaminophen  (TYLENOL ) 325 MG tablet Take 325 mg by mouth every 6 (six) hours as needed for fever.   Yes [provider]  Alum & Mag Hydroxide-Simeth (ANTACID & ANTIGAS PO) Take 30 mLs by mouth every 4 (four) hours as needed. for Indigestion   Yes [provider]  aspirin  EC 81 MG tablet Take 81 mg by mouth daily. Swallow whole.   Yes [provider]  calcium  carbonate (OSCAL) 1500 (600 Ca) MG TABS tablet Take 600 mg of elemental calcium  by mouth daily.   Yes [provider]  Cholecalciferol  (VITAMIN D3) 2000 units capsule Take 2,000 Units by mouth daily.   Yes [provider]  docusate sodium  (COLACE) 100 MG capsule Take 100 mg by mouth daily.   Yes [provider]  fluticasone (FLONASE) 50 MCG/ACT nasal spray Place 1 spray into both nostrils 2 (two) times daily.   Yes [provider]  furosemide (LASIX) 20 MG tablet Take 40 mg by mouth daily. 09/03/23  Yes [provider]  gabapentin (NEURONTIN) 100 MG capsule Take 100 mg by mouth daily as needed (for restless leg syndrome).   Yes [provider]  loperamide (IMODIUM A-D) 2 MG tablet Take 2 mg by mouth as needed for diarrhea or loose stools.   Yes [provider]  Magnesium  300 MG CAPS Take 300 mg by mouth daily.   Yes [provider]  magnesium  hydroxide (MILK OF MAGNESIA) 400 MG/5ML suspension Take 30 mLs by mouth daily as needed for mild constipation.   Yes [provider]  metoprolol  tartrate  (LOPRESSOR ) 25 MG tablet Take 25 mg by mouth 2 (two) times daily.   Yes [provider]  ondansetron  (ZOFRAN ) 4 MG tablet Take 4 mg by mouth every 6 (six) hours as needed for vomiting or nausea. 09/06/23  Yes [provider]  pantoprazole  (PROTONIX ) 40 MG tablet Take 40 mg by mouth daily.   Yes [provider]  polyethylene glycol (MIRALAX  / GLYCOLAX ) 17 g packet Take 17 g by mouth daily.   Yes [provider]  potassium chloride  SA (KLOR-CON ) 20 MEQ tablet Take 20 mEq by mouth daily.   Yes [provider]  Propylene Glycol (SYSTANE BALANCE OP) Apply 1 drop to eye every evening. related to PRESENCE OF INTRAOCULAR LENS (Z96.1)  wait 10 minutes between Systane and timolol drops   Yes [provider]  rosuvastatin  (CRESTOR ) 10 MG tablet Take 10 mg by mouth at bedtime.   Yes [provider]  Sennosides (SENNA) 8.6 MG CAPS Take one to two capsules as needed at bedtime If no bowel movements in 3 days 08/20/23  Yes Elois Hair, MD  timolol (TIMOPTIC) 0.5 % ophthalmic solution Place 1 drop into the left eye 2 (two) times daily. related to PRESENCE OF INTRAOCULAR LENS (Z96.1) Wait 10 minutes between Systane and timolol drops   Yes [provider]  venlafaxine  XR (EFFEXOR -XR) 75 MG 24 hr capsule TAKE 2 CAPSULES (150MG ) BY MOUTH DAILY. 12/05/20  Yes Burchette, Marijean Shouts, MD  vitamin B-12 (CYANOCOBALAMIN) 1000 MCG tablet Take 1,000 mcg by mouth daily.   Yes [provider]  simethicone  (MYLICON) 80 MG chewable tablet Chew 1 tablet (80 mg total) by mouth 4 (four) times daily. Patient taking differently: Chew 80 mg by mouth 4 (four) times daily as needed for flatulence. 06/22/23   Medina-Vargas, Monina C, NP                                                                                 Vitals:   01/11/24 1130 01/11/24 1145 01/11/24 1200 01/11/24 1341  BP: (!) 132/59 138/63 (!) 141/63 (!) 185/83  Pulse: 71 71 72 68  Resp: (!)  27 18 (!) 23 18  Temp:      TempSrc:      SpO2: 100% 100% 100% 100%  Weight:      Height:       Physical Exam Vitals reviewed.  Constitutional:      General: She is not in acute distress. Eyes:     Pupils: Pupils are unequal.     Right eye: Pupil is sluggish.     Left eye: Pupil is sluggish.     Comments: Right one is 2 mm and left one is 4 mm.   Cardiovascular:     Rate and Rhythm: Normal rate and regular rhythm.     Heart sounds: Normal heart sounds.  Pulmonary:     Breath sounds: Normal breath sounds.  Abdominal:     Palpations: Abdomen is soft.  Musculoskeletal:     Right lower leg: No edema.     Left lower leg: No edema.  Neurological:     Mental Status: She is unresponsive.     Labs on Admission: I have personally reviewed following labs and imaging studies CBC: Recent Labs  Lab 01/11/24 1039 01/11/24 1052 01/11/24 1216  WBC 4.8  --  6.0  NEUTROABS 3.2  --   --   HGB 13.0 14.3 12.2  HCT 39.3 42.0 37.4  MCV 94.0  --  94.0  PLT 95*  --  86*   Basic Metabolic Panel: Recent Labs  Lab 01/11/24 1039 01/11/24 1052  NA 141 141  K 3.1* 3.1*  CL 102 103  CO2 24  --   GLUCOSE 158* 154*  BUN 17 18  CREATININE 0.82 0.80  CALCIUM  10.1  --    GFR: Estimated Creatinine Clearance: 47.7 mL/min (by C-G formula based on SCr of  0.8 mg/dL). Liver Function Tests: Recent Labs  Lab 01/11/24 1039  AST 31  ALT 17  ALKPHOS 58  BILITOT 1.6*  PROT 7.5  ALBUMIN  4.2   No results for input(s): "LIPASE", "AMYLASE" in the last 168 hours. No results for input(s): "AMMONIA" in the last 168 hours. Coagulation Profile: Recent Labs  Lab 01/11/24 1039 01/11/24 1216  INR 1.0 1.1   Cardiac Enzymes: No results for input(s): "CKTOTAL", "CKMB", "CKMBINDEX", "TROPONINI" in the last 168 hours. BNP (last 3 results) No results for input(s): "PROBNP" in the last 8760 hours. HbA1C: No results for input(s): "HGBA1C" in the last 72 hours. CBG: Recent Labs  Lab 01/11/24 1044   GLUCAP 158*   Lipid Profile: No results for input(s): "CHOL", "HDL", "LDLCALC", "TRIG", "CHOLHDL", "LDLDIRECT" in the last 72 hours. Thyroid  Function Tests: No results for input(s): "TSH", "T4TOTAL", "FREET4", "T3FREE", "THYROIDAB" in the last 72 hours. Anemia Panel: No results for input(s): "VITAMINB12", "FOLATE", "FERRITIN", "TIBC", "IRON", "RETICCTPCT" in the last 72 hours. Urine analysis:    Component Value Date/Time   COLORURINE YELLOW 06/06/2016 2005   APPEARANCEUR CLOUDY (A) 06/06/2016 2005   LABSPEC 1.026 06/06/2016 2005   PHURINE 6.0 06/06/2016 2005   GLUCOSEU NEGATIVE 06/06/2016 2005   GLUCOSEU NEGATIVE 04/23/2014 1207   HGBUR SMALL (A) 06/06/2016 2005   BILIRUBINUR neg 07/21/2019 1628   KETONESUR NEGATIVE 06/06/2016 2005   PROTEINUR Negative 07/21/2019 1628   PROTEINUR NEGATIVE 06/06/2016 2005   UROBILINOGEN 0.2 07/21/2019 1628   UROBILINOGEN 0.2 04/23/2014 1207   NITRITE neg 07/21/2019 1628   NITRITE NEGATIVE 06/06/2016 2005   LEUKOCYTESUR Large (3+) (A) 07/21/2019 1628   Radiological Exams on Admission: DG Chest Portable 1 View Result Date: 01/11/2024 CLINICAL DATA:  Intubation. EXAM: PORTABLE CHEST 1 VIEW COMPARISON:  12/13/2020. FINDINGS: The heart is enlarged and the mediastinal contour stable. There is atherosclerotic calcification of the aorta. Lung volumes are low with atelectasis at the lung bases. No definite effusion or pneumothorax is seen. No acute osseous abnormality. No endotracheal tube is seen. IMPRESSION: 1. Low lung volumes with atelectasis at the lung bases. 2. No endotracheal tube is seen. Electronically Signed   By: Wyvonnia Heimlich M.D.   On: 01/11/2024 11:27   CT HEAD CODE STROKE WO CONTRAST Result Date: 01/11/2024 CLINICAL DATA:  Code stroke. EXAM: CT HEAD WITHOUT CONTRAST TECHNIQUE: Contiguous axial images were obtained from the base of the skull through the vertex without intravenous contrast. RADIATION DOSE REDUCTION: This exam was performed  according to the departmental dose-optimization program which includes automated exposure control, adjustment of the mA and/or kV according to patient size and/or use of iterative reconstruction technique. COMPARISON:  CT head without contrast 05/16/2020. FINDINGS: Brain: Extensive subarachnoid hemorrhage present bilaterally and extends basal cisterns and inferiorly surrounding anterior brainstem and upper cervical spine. Trace blood is noted in the posterior horn of the right lateral ventricle. Subarachnoid blood is most prominent in the left sylvian fissure. Parenchymal hemorrhage inferior to the left basal ganglia measures 2.5 x 2.6 x 2.1 cm. Midline shift scratched at left right midline shift measures 6 mm. Downward herniation is present. The cerebellar tonsils fill the foramen magnum. Next Extensive white matter disease is present. No parenchymal infarct or other mass lesion is evident. Vascular: Atherosclerotic calcifications are present within the cavernous internal carotid arteries and at the dural margin of both vertebral arteries. No hyperdense vessel present. Skull: Calvarium is intact. No focal lytic or blastic lesions are present. No significant extracranial soft tissue lesion is  present. Sinuses/Orbits: The paranasal sinuses and mastoid air cells are clear. Bilateral lens replacements are noted. Globes and orbits are otherwise unremarkable. Other: The patient is intubated. ASPECTS [facility] Stroke Program Early CT Score) - Ganglionic level infarction (caudate, lentiform nuclei, internal capsule, insula, M1-M3 cortex): 7/7 - Supraganglionic infarction (M4-M6 cortex): 3/3 Total score (0-10 with 10 being normal): 10/10 IMPRESSION: 1. Extensive subarachnoid hemorrhage bilaterally and extends into the basal cisterns and inferiorly surrounding the anterior brainstem and upper cervical spine. 2. Parenchymal hemorrhage inferior to the left basal ganglia measures 2.5 x 2.6 x 2.1 cm. 3. Trace blood in the  posterior horn of the right lateral ventricle. 4. 6 mm of left right midline shift. 5. Downward herniation of the cerebellar tonsils. 6. Aspects is 10/10. 7. Extensive white matter disease likely reflects the sequela of chronic microvascular ischemia. The above was relayed via text pager to Dr. Lindzen on 01/11/2024 at 11:07 . Electronically Signed   By: Audree Leas M.D.   On: 01/11/2024 11:07   Data Reviewed: Relevant notes from primary care and specialist visits, past discharge summaries as available in EHR, including Care Everywhere. Prior diagnostic testing as pertinent to current admission diagnoses, Updated medications and problem lists for reconciliation ED course, including vitals, labs, imaging, treatment and response to treatment,Triage notes, nursing and pharmacy notes and ED provider's notes Notable results as noted in HPI.Discussed case with EDMD/ ED APP/ or Specialty MD on call and as needed.  Assessment & Plan  >>AMS: Pt found unresponsive and has ICH and SAH with herniation and terminal prognosis.  Family has agreed with comfort measure. I called number in chart and no answer.  I called daughter and no answer.   >>Comfort measure: Pt has terminal prognosis with ICH and cerebellar herniation.  Comfort measure initiated.   DVT prophylaxis:  None.   Consults:  Neurology. PCCM.  Advance Care Planning:    Code Status: Do not attempt resuscitation (DNR) - Comfort care   Family Communication:  None.   Disposition Plan:  Imminent Death.  Severity of Illness: The appropriate patient status for this patient is OBSERVATION. Observation status is judged to be reasonable and necessary in order to provide the required intensity of service to ensure the patient's safety. The patient's presenting symptoms, physical exam findings, and initial radiographic and laboratory data in the context of their medical condition is felt to place them at decreased risk for further clinical  deterioration. Furthermore, it is anticipated that the patient will be medically stable for discharge from the hospital within 2 midnights of admission.   Unresulted Labs (From admission, onward)     Start     Ordered   01/11/24 1203  Urine rapid drug screen (hosp performed)  Once,   R        01/11/24 1205            Orders Placed This Encounter  Procedures   CT HEAD CODE STROKE WO CONTRAST   DG Chest Portable 1 View   Protime-INR   APTT   CBC   Differential   Comprehensive metabolic panel   Ethanol   CBC   Protime-INR   APTT   Urine rapid drug screen (hosp performed)   Diet NPO time specified   ED Cardiac monitoring   NIH Stroke Scale   Saline Lock IV, Maintain IV access   If O2 sat <94% Administer O2 @ 2 Liters/Minute   SCD's   Hunt-Hess Score Hunt-Hess Score: 4- Stupor, moderate-severe hemiparesis The  first Hunt-Hess performed within 6 hours of hospital arrival and prior to the initiation of any invasive intracranial procedure.   Vital signs   Notify physician (specify)   Elevate head of bed   Swallow screen - If patient does NOT pass this screen, place order for SLP eval and treat (SLP2) - swallowing evaluation (BSE, MBS and/or diet order as indicated)   RN to notify Physician for appropriate diet after patient passes swallow screen   Initiate the ICU Neuro Early Mobilization Protocol, refer to ICU Neuro Early Mobilization sidebar   NIH Stroke Scale   Intake and output   Apply Stroke Care Plan: Subarachnoid Hemorrhage, Nontraumatic   Discuss with patient and document patient's goals for stroke risk factor reduction   Initiate Oral Care Protocol   Initiate Carrier Fluid Protocol   Provide stroke education material to patient and family   Nurse to provide smoking / tobacco cessation education   Cardiac Monitoring Continuous x 48 hours Indications for use: Acute neurological event   Insert feeding tube   Before discontinuing vasopressors, ensure that the patient  has been extubated   Wean pressors once extubated: Decrease vasopressors to half (1/2) the current rate over 10 minutes, then wean to off over 10 minutes   Discontinue all previous orders, including vital signs, medications, enteral feeding, intravenous drips, blood sugar monitoring, radiographs, and laboratory tests. If TPN is running, discontinue the TPN after current bag and hang D5W at [facility]   Discontinue restraints   Remove devices not necessary for comfort, including monitors, hemodynamic lines (except ok to leave central line and foley), blood pressure cuffs, and leg compression sleeve(s)   RNs (two) may pronounce death   If not in ICU, remove patient from central telemetry monitoring   Do not attempt resuscitation (DNR) - Comfort care   Clerk to Activate Code Stroke   Consult to neurosurgery   Consult to intensivist   ED Pulse oximetry, continuous   Pulse oximetry, continuous   Oxygen  therapy Mode or (Route): Nasal cannula; Liters Per Minute: 2; Keep O2 saturation between: > 92%   I-stat chem 8, ED   CBG monitoring, ED   Admit to Inpatient (patient's expected length of stay will be greater than 2 midnights or inpatient only procedure)   Admit to Inpatient (patient's expected length of stay will be greater than 2 midnights or inpatient only procedure)   Fall precautions   Unrestricted visitation status    Author: Lavanda Porter, MD 12 pm -8 pm. 01/11/2024 1:57 PM >>Please note for any concern,or critical results after hours past 8pm please contact the [facility] hospitalist [facility] floor coverage provider from 7 PM- 7 AM. For on call review [email or web address], username [email or web address] and PW: your phone number<<

## 2024-01-11 NOTE — Plan of Care (Signed)
  Problem: Pain Managment: Goal: General experience of comfort will improve and/or be controlled 01/11/2024 1540 by Apolinar Baxter, RN Outcome: Progressing 01/11/2024 1539 by Apolinar Baxter, RN Outcome: Not Progressing

## 2024-01-12 MED ORDER — LORAZEPAM 2 MG/ML IJ SOLN
1.0000 mg | INTRAMUSCULAR | Status: DC | PRN
  Administered 2024-01-12 (×2): 1 mg via INTRAVENOUS
  Filled 2024-01-12 (×2): qty 1

## 2024-01-12 MED ORDER — MORPHINE SULFATE (PF) 2 MG/ML IV SOLN
1.0000 mg | INTRAVENOUS | Status: DC | PRN
  Filled 2024-01-12: qty 1

## 2024-01-12 MED ORDER — MORPHINE SULFATE (PF) 2 MG/ML IV SOLN
1.0000 mg | INTRAVENOUS | Status: DC | PRN
  Administered 2024-01-12 (×2): 2 mg via INTRAVENOUS
  Filled 2024-01-12 (×2): qty 1

## 2024-01-12 MED ORDER — MORPHINE SULFATE (PF) 2 MG/ML IV SOLN
1.0000 mg | INTRAVENOUS | Status: DC | PRN
  Administered 2024-01-12 (×3): 1 mg via INTRAVENOUS
  Filled 2024-01-12 (×2): qty 1

## 2024-01-24 ENCOUNTER — Ambulatory Visit: Admitting: Cardiology

## 2024-02-09 NOTE — Plan of Care (Signed)
  Problem: Nutrition: Goal: Risk of aspiration will decrease Outcome: Progressing   Problem: Nutrition: Goal: Dietary intake will improve Outcome: Progressing   Problem: Clinical Measurements: Goal: Respiratory complications will improve Outcome: Progressing

## 2024-02-09 NOTE — Death Summary Note (Signed)
 DEATH SUMMARY   Patient Details  Name: Sara Logan MRN: [id number] DOB: 01/26/1933 WUJ:[id number], Sara Dale, MD Admission/Discharge Information   Admit Date:  Jan 18, 2024  Date of Death: Date of Death: Jan 19, 2024  Time of Death: Time of Death: 1550  Length of Stay: 1   Principle Cause of death: Acute cardiopulmonary arrest in the setting of subarachnoid hemorrhage  Hospital Diagnoses: Principal Problem:   SAH (subarachnoid hemorrhage) [facility])   Hospital Course: The patient was an 89 year old Caucasian female with a past medical history significant for but not limited to essential hypertension, history of CVA, history of falls, history of depression and other comorbidities who was brought to the hospital via EMS for altered mental status with her last known well time around 720.  She presented from her facility and was found to be slumped in her chair and unresponsive.  Per report she was eating prior to arrival but then subsequently was found later in her chair with decreased responsiveness and leaning to the right with use of her mouth.  Upon EMS arrival she continued to be poorly responsive and her GCS was 7.  Blood pressure was extremely elevated and heart rate varied between 40 and 120.  O2 mask was started and heart rate stabilized.  She continued to be unarousable and route to the ED and upon arrival to the ED her BVM was removed and her respirations were stable.  Further workup was done and she was found to have an extensive subarachnoid hemorrhage bilaterally that extended into the basal cisterns inferiorly and anterior brainstem with on cervical spine.  There is also parenchymal hemorrhage in the inferior to the left basal ganglia measuring 2.5 x 2.6 x 2.1 cm and trace blood in the posterior horn of the right lateral ventricle and a 6 mm of the left to right midline shift with downward herniation of the cerebellar tonsils.  Neurosurgery evaluated and there is concern for ruptured distal  internal carotid artery aneurysm versus MCA aneurysm.  Patient was actively DNR and no further workup was pursued.  PCCM was consulted and she was transitioned to full comfort measures and all medications on the patient's comfort were discontinued.  She was admitted for comfort care status and end-of-life care and as expected she decompensated and passed away with family at bedside at 1550  Assessment and Plan: No notes have been filed under this hospital service. Service: Hospitalist  Aneurysmal subarachnoid hemorrhage in the H/H4, and F4 probably due to a ruptured left MCA aneurysm and a left frontal intraparenchymal hemorrhage with cerebellar herniation - She has been made comfort care measures only and is DNR/DNI and as expected passed away.  Had morphine  for comfort every hour  Acute encephalopathy in the setting of intracranial bleed: Supportive care  Essential Hypertension: Antihypertensives and not check any more vitals  Hyperlipidemia: Discontinue statin as she is now comfort measures  Hypokalemia: Will not check any more laboratory work given that she is comfort measures  Thrombocytopenia: Will not Continue to monitor and evaluate given she is for comfort measures now  Hyperbilirubinemia: Not taken on more lab work  Class I Obesity: Complicates overall prognosis and care. Estimated body mass index is 31.24 kg/m as calculated from the following:   Height as of this encounter: 5\' 3"  (1.6 m).   Weight as of this encounter: 80 kg. Weight Loss and Dietary Counseling not given as she is Comfort care  All medications not in the patient's comfort were discontinued and she was started  on comfort measures.  Procedures: As delineated as above  Consultations: PCCM, Neurology, Neurosurgery  The results of significant diagnostics from this hospitalization (including imaging, microbiology, ancillary and laboratory) are listed below for reference.   Significant Diagnostic Studies: DG  Chest Portable 1 View Result Date: 01/11/2024 CLINICAL DATA:  Intubation. EXAM: PORTABLE CHEST 1 VIEW COMPARISON:  12/13/2020. FINDINGS: The heart is enlarged and the mediastinal contour stable. There is atherosclerotic calcification of the aorta. Lung volumes are low with atelectasis at the lung bases. No definite effusion or pneumothorax is seen. No acute osseous abnormality. No endotracheal tube is seen. IMPRESSION: 1. Low lung volumes with atelectasis at the lung bases. 2. No endotracheal tube is seen. Electronically Signed   By: Wyvonnia Heimlich M.D.   On: 01/11/2024 11:27   CT HEAD CODE STROKE WO CONTRAST Result Date: 01/11/2024 CLINICAL DATA:  Code stroke. EXAM: CT HEAD WITHOUT CONTRAST TECHNIQUE: Contiguous axial images were obtained from the base of the skull through the vertex without intravenous contrast. RADIATION DOSE REDUCTION: This exam was performed according to the departmental dose-optimization program which includes automated exposure control, adjustment of the mA and/or kV according to patient size and/or use of iterative reconstruction technique. COMPARISON:  CT head without contrast 05/16/2020. FINDINGS: Brain: Extensive subarachnoid hemorrhage present bilaterally and extends basal cisterns and inferiorly surrounding anterior brainstem and upper cervical spine. Trace blood is noted in the posterior horn of the right lateral ventricle. Subarachnoid blood is most prominent in the left sylvian fissure. Parenchymal hemorrhage inferior to the left basal ganglia measures 2.5 x 2.6 x 2.1 cm. Midline shift scratched at left right midline shift measures 6 mm. Downward herniation is present. The cerebellar tonsils fill the foramen magnum. Next Extensive white matter disease is present. No parenchymal infarct or other mass lesion is evident. Vascular: Atherosclerotic calcifications are present within the cavernous internal carotid arteries and at the dural margin of both vertebral arteries. No hyperdense  vessel present. Skull: Calvarium is intact. No focal lytic or blastic lesions are present. No significant extracranial soft tissue lesion is present. Sinuses/Orbits: The paranasal sinuses and mastoid air cells are clear. Bilateral lens replacements are noted. Globes and orbits are otherwise unremarkable. Other: The patient is intubated. ASPECTS [facility] Stroke Program Early CT Score) - Ganglionic level infarction (caudate, lentiform nuclei, internal capsule, insula, M1-M3 cortex): 7/7 - Supraganglionic infarction (M4-M6 cortex): 3/3 Total score (0-10 with 10 being normal): 10/10 IMPRESSION: 1. Extensive subarachnoid hemorrhage bilaterally and extends into the basal cisterns and inferiorly surrounding the anterior brainstem and upper cervical spine. 2. Parenchymal hemorrhage inferior to the left basal ganglia measures 2.5 x 2.6 x 2.1 cm. 3. Trace blood in the posterior horn of the right lateral ventricle. 4. 6 mm of left right midline shift. 5. Downward herniation of the cerebellar tonsils. 6. Aspects is 10/10. 7. Extensive white matter disease likely reflects the sequela of chronic microvascular ischemia. The above was relayed via text pager to Dr. Lindzen on 01/11/2024 at 11:07 . Electronically Signed   By: Audree Leas M.D.   On: 01/11/2024 11:07   Microbiology: No results found for this or any previous visit (from the past 240 hours).  Time spent: 35 minutes  Signed: Aura Leeds, DO [facility] 01/18/2024

## 2024-02-09 NOTE — Progress Notes (Signed)
 TOD 1550. Verified by two Nurses. [name] RN and Doctor, hospital.

## 2024-02-09 NOTE — Progress Notes (Signed)
 Pt has labored breathing, noisy d/t secretions that cannot be suctioned. Giving PRNs for secretions and breathing. Pt does not respond to voice or pain for me this morning. Earlier in my shift pt would bite yaunker, now she doesn't, and no gag reflex.

## 2024-02-09 DEATH — deceased
